# Patient Record
Sex: Female | Born: 1960
Health system: Southern US, Community
[De-identification: ages and names within clinical notes are randomized; demographics above are authoritative.]

## PROBLEM LIST (undated history)

## (undated) ENCOUNTER — Emergency Department: Admission: EM | Payer: No Typology Code available for payment source | Source: Home / Self Care

## (undated) DIAGNOSIS — R41841 Cognitive communication deficit: Secondary | ICD-10-CM

## (undated) DIAGNOSIS — K635 Polyp of colon: Secondary | ICD-10-CM

## (undated) DIAGNOSIS — E785 Hyperlipidemia, unspecified: Secondary | ICD-10-CM

## (undated) DIAGNOSIS — I671 Cerebral aneurysm, nonruptured: Secondary | ICD-10-CM

## (undated) DIAGNOSIS — G8190 Hemiplegia, unspecified affecting unspecified side: Secondary | ICD-10-CM

## (undated) DIAGNOSIS — I639 Cerebral infarction, unspecified: Secondary | ICD-10-CM

## (undated) DIAGNOSIS — G459 Transient cerebral ischemic attack, unspecified: Secondary | ICD-10-CM

## (undated) DIAGNOSIS — K648 Other hemorrhoids: Secondary | ICD-10-CM

## (undated) HISTORY — DX: Other hemorrhoids: K64.8

## (undated) HISTORY — DX: Cognitive communication deficit: R41.841

## (undated) HISTORY — DX: Polyp of colon: K63.5

## (undated) HISTORY — DX: Cerebral infarction, unspecified: I63.9

## (undated) HISTORY — DX: Hemiplegia, unspecified affecting unspecified side: G81.90

## (undated) HISTORY — DX: Transient cerebral ischemic attack, unspecified: G45.9

## (undated) HISTORY — PX: BREAST BIOPSY: SHX20

## (undated) HISTORY — PX: COLONOSCOPY: SHX174

## (undated) HISTORY — DX: Cerebral aneurysm, nonruptured: I67.1

## (undated) HISTORY — DX: Hyperlipidemia, unspecified: E78.5

---

## 1999-09-28 ENCOUNTER — Other Ambulatory Visit: Admission: RE | Admit: 1999-09-28 | Discharge: 1999-09-28 | Payer: Self-pay | Admitting: Internal Medicine

## 1999-10-22 HISTORY — PX: PARTIAL HYSTERECTOMY: SHX80

## 1999-11-15 ENCOUNTER — Encounter: Admission: RE | Admit: 1999-11-15 | Discharge: 1999-11-15 | Payer: Self-pay | Admitting: Obstetrics & Gynecology

## 1999-12-13 ENCOUNTER — Encounter: Admission: RE | Admit: 1999-12-13 | Discharge: 1999-12-13 | Payer: Self-pay | Admitting: Obstetrics & Gynecology

## 2000-02-02 ENCOUNTER — Encounter (INDEPENDENT_AMBULATORY_CARE_PROVIDER_SITE_OTHER): Payer: Self-pay | Admitting: Specialist

## 2000-02-03 ENCOUNTER — Inpatient Hospital Stay (HOSPITAL_COMMUNITY): Admission: RE | Admit: 2000-02-03 | Discharge: 2000-02-04 | Payer: Self-pay | Admitting: Obstetrics & Gynecology

## 2000-02-05 ENCOUNTER — Inpatient Hospital Stay (HOSPITAL_COMMUNITY): Admission: AD | Admit: 2000-02-05 | Discharge: 2000-02-05 | Payer: Self-pay | Admitting: *Deleted

## 2000-02-07 ENCOUNTER — Encounter: Admission: RE | Admit: 2000-02-07 | Discharge: 2000-02-07 | Payer: Self-pay | Admitting: Obstetrics & Gynecology

## 2000-02-28 ENCOUNTER — Encounter: Admission: RE | Admit: 2000-02-28 | Discharge: 2000-02-28 | Payer: Self-pay | Admitting: Obstetrics & Gynecology

## 2000-06-05 ENCOUNTER — Emergency Department (HOSPITAL_COMMUNITY): Admission: EM | Admit: 2000-06-05 | Discharge: 2000-06-05 | Payer: Self-pay | Admitting: Emergency Medicine

## 2000-06-05 ENCOUNTER — Encounter: Payer: Self-pay | Admitting: Emergency Medicine

## 2000-10-21 ENCOUNTER — Encounter (INDEPENDENT_AMBULATORY_CARE_PROVIDER_SITE_OTHER): Payer: Self-pay | Admitting: *Deleted

## 2000-10-21 LAB — CONVERTED CEMR LAB

## 2001-08-27 ENCOUNTER — Other Ambulatory Visit: Admission: RE | Admit: 2001-08-27 | Discharge: 2001-08-27 | Payer: Self-pay | Admitting: Anesthesiology

## 2001-08-27 ENCOUNTER — Other Ambulatory Visit: Admission: RE | Admit: 2001-08-27 | Discharge: 2001-08-27 | Payer: Self-pay | Admitting: Family Medicine

## 2002-01-21 ENCOUNTER — Encounter: Admission: RE | Admit: 2002-01-21 | Discharge: 2002-01-21 | Payer: Self-pay | Admitting: Sports Medicine

## 2002-06-18 ENCOUNTER — Emergency Department (HOSPITAL_COMMUNITY): Admission: EM | Admit: 2002-06-18 | Discharge: 2002-06-18 | Payer: Self-pay | Admitting: Emergency Medicine

## 2003-10-22 LAB — HM MAMMOGRAPHY

## 2003-11-13 ENCOUNTER — Emergency Department (HOSPITAL_COMMUNITY): Admission: EM | Admit: 2003-11-13 | Discharge: 2003-11-13 | Payer: Self-pay | Admitting: Emergency Medicine

## 2004-07-22 ENCOUNTER — Emergency Department (HOSPITAL_COMMUNITY): Admission: EM | Admit: 2004-07-22 | Discharge: 2004-07-22 | Payer: Self-pay | Admitting: Emergency Medicine

## 2004-12-23 ENCOUNTER — Ambulatory Visit: Payer: Self-pay | Admitting: Internal Medicine

## 2005-04-02 ENCOUNTER — Emergency Department (HOSPITAL_COMMUNITY): Admission: EM | Admit: 2005-04-02 | Discharge: 2005-04-02 | Payer: Self-pay | Admitting: Emergency Medicine

## 2005-12-12 ENCOUNTER — Emergency Department (HOSPITAL_COMMUNITY): Admission: EM | Admit: 2005-12-12 | Discharge: 2005-12-12 | Payer: Self-pay | Admitting: Family Medicine

## 2006-04-19 DIAGNOSIS — F17200 Nicotine dependence, unspecified, uncomplicated: Secondary | ICD-10-CM | POA: Insufficient documentation

## 2006-04-20 ENCOUNTER — Encounter (INDEPENDENT_AMBULATORY_CARE_PROVIDER_SITE_OTHER): Payer: Self-pay | Admitting: *Deleted

## 2006-11-19 ENCOUNTER — Encounter (INDEPENDENT_AMBULATORY_CARE_PROVIDER_SITE_OTHER): Payer: Self-pay | Admitting: Internal Medicine

## 2007-07-30 ENCOUNTER — Ambulatory Visit: Payer: Self-pay | Admitting: Internal Medicine

## 2007-07-30 ENCOUNTER — Encounter (INDEPENDENT_AMBULATORY_CARE_PROVIDER_SITE_OTHER): Payer: Self-pay | Admitting: *Deleted

## 2007-07-30 DIAGNOSIS — E669 Obesity, unspecified: Secondary | ICD-10-CM | POA: Insufficient documentation

## 2007-07-30 DIAGNOSIS — N939 Abnormal uterine and vaginal bleeding, unspecified: Secondary | ICD-10-CM

## 2007-07-30 DIAGNOSIS — N926 Irregular menstruation, unspecified: Secondary | ICD-10-CM | POA: Insufficient documentation

## 2007-08-08 LAB — CONVERTED CEMR LAB
HDL: 57 mg/dL (ref >39)
LDL Cholesterol: 169 mg/dL — ABNORMAL HIGH (ref 0–99)
VLDL: 31 mg/dL (ref 0–40)

## 2007-08-14 ENCOUNTER — Ambulatory Visit (HOSPITAL_COMMUNITY): Admission: RE | Admit: 2007-08-14 | Discharge: 2007-08-14 | Payer: Self-pay | Admitting: Obstetrics & Gynecology

## 2008-08-05 ENCOUNTER — Encounter (INDEPENDENT_AMBULATORY_CARE_PROVIDER_SITE_OTHER): Payer: Self-pay | Admitting: Internal Medicine

## 2008-08-05 ENCOUNTER — Ambulatory Visit: Payer: Self-pay | Admitting: Internal Medicine

## 2008-08-05 DIAGNOSIS — G43109 Migraine with aura, not intractable, without status migrainosus: Secondary | ICD-10-CM | POA: Insufficient documentation

## 2008-08-05 DIAGNOSIS — E785 Hyperlipidemia, unspecified: Secondary | ICD-10-CM | POA: Insufficient documentation

## 2008-08-06 ENCOUNTER — Encounter: Payer: Self-pay | Admitting: Internal Medicine

## 2008-08-06 LAB — CONVERTED CEMR LAB
ALT: 15 units/L (ref 0–35)
AST: 19 units/L (ref 0–37)
CO2: 23 meq/L (ref 19–32)
Creatinine, Ser: 0.8 mg/dL (ref 0.40–1.20)
GFR calc Af Amer: >60 mL/min (ref >60)
HDL: 48 mg/dL (ref >39)
LDL Cholesterol: 160 mg/dL — ABNORMAL HIGH (ref 0–99)
Sodium: 142 meq/L (ref 135–145)
Total Bilirubin: 0.3 mg/dL (ref 0.3–1.2)
Total CHOL/HDL Ratio: 4.8
Total Protein: 6.7 g/dL (ref 6.0–8.3)
Triglycerides: 112 mg/dL (ref <150)
VLDL: 22 mg/dL (ref 0–40)

## 2009-01-10 ENCOUNTER — Emergency Department (HOSPITAL_COMMUNITY): Admission: EM | Admit: 2009-01-10 | Discharge: 2009-01-11 | Payer: Self-pay | Admitting: Emergency Medicine

## 2009-01-20 ENCOUNTER — Encounter: Payer: Self-pay | Admitting: Internal Medicine

## 2009-02-10 ENCOUNTER — Encounter: Payer: Self-pay | Admitting: Internal Medicine

## 2009-08-25 ENCOUNTER — Other Ambulatory Visit: Admission: RE | Admit: 2009-08-25 | Discharge: 2009-08-25 | Payer: Self-pay | Admitting: Internal Medicine

## 2009-08-25 ENCOUNTER — Ambulatory Visit: Payer: Self-pay | Admitting: Internal Medicine

## 2009-08-25 DIAGNOSIS — N898 Other specified noninflammatory disorders of vagina: Secondary | ICD-10-CM | POA: Insufficient documentation

## 2009-08-25 DIAGNOSIS — N3000 Acute cystitis without hematuria: Secondary | ICD-10-CM | POA: Insufficient documentation

## 2009-08-25 LAB — HM PAP SMEAR: HM Pap smear: NORMAL

## 2009-08-26 ENCOUNTER — Encounter: Payer: Self-pay | Admitting: Internal Medicine

## 2009-08-26 LAB — CONVERTED CEMR LAB
Candida species: NEGATIVE
GC Probe Amp, Genital: NEGATIVE

## 2009-08-30 ENCOUNTER — Telehealth: Payer: Self-pay | Admitting: Internal Medicine

## 2010-03-22 NOTE — Assessment & Plan Note (Signed)
Summary: ACUTE-STOMACH PAIN(UNASSIGNED)/CFB   Vital Signs:  Patient profile:   50 year old female Height:      66 inches (167.64 cm) Weight:      200.2 pounds (89.64 kg) BMI:     31.94 Temp:     98.3 degrees F (36.83 degrees C) oral Pulse rate:   90 / minute BP sitting:   122 / 75  (right arm) Cuff size:   [id number]  Vitals Entered By: Theotis Barrio NT II (August 25, 2009 2:22 PM) CC: ABD PAIN FOR ABOUT 2 WEEKS / ? MEDICATION REFILL Is Patient Diabetic? No Pain Assessment Patient in pain? yes     Location: abdomen Intensity:        6 Type: dull Onset of pain  FOR ABOUT 2 WEEKS Nutritional Status BMI of > 30 = obese  Have you ever been in a relationship where you felt threatened, hurt or afraid?No   Does patient need assistance? Functional Status Self care Ambulation Normal Comments ABD PAIN FOR ABOUT 2 WEEKS  / ? MEDICATION REFILL   Primary Care Provider:  Lars Mage MD  CC:  ABD PAIN FOR ABOUT 2 WEEKS / ? MEDICATION REFILL.  History of Present Illness: Patient is a 50 year old relatively healthy women comes for a lower abdominal pain and discharge.  The pain started 2 weeks ago, its on and off [prsnt in the lower abdomen right aroung suprapubic region, 6/10 at its worse, a/w burning micturition and whitish discharge from her vagina. Off note she is s/p total hysterctomy 2 years ago. No fevers, chills, nausea, vomiting,hematuria, constipation, change in bowel or bladder habits. No increased frequency noted.  No recent new partners. The partner does not have similar discharge.  Bp is well controlled.  Headaches are well controlled and not taking medicines anymore.  Preventive Screening-Counseling & Management  Alcohol-Tobacco     Smoking Status: current     Smoking Cessation Counseling: yes     Packs/Day: 1/2  Caffeine-Diet-Exercise     Does Patient Exercise: yes     Type of exercise: WALKING     Exercise (avg: min/session): 1HOUR     Times/week: 2  Problems  Prior to Update: 1)  Mixed Hyperlipidemia  (ICD-272.2) 2)  Migraine With Aura  (ICD-346.00) 3)  Preventive Health Care  (ICD-V70.0) 4)  Obesity, Unspecified  (ICD-278.00) 5)  Abnormal Vaginal Bleeding  (ICD-626.9) 6)  Tobacco Dependence  (ICD-305.1)  Medications Prior to Update: 1)  Aleve 220 Mg  Tabs (Naproxen Sodium) .... Take One Pill As Needed For Pain. 2)  Excedrin Migraine 250-250-65 Mg Tabs (Aspirin-Acetaminophen-Caffeine) .... Take One Tab Up To 3 Times Daily For Headache 3)  Pravastatin Sodium 20 Mg Tabs (Pravastatin Sodium) .... Take One Tab Daily  Current Medications (verified): 1)  Excedrin Migraine 250-250-65 Mg Tabs (Aspirin-Acetaminophen-Caffeine) .... Take One Tab Up To 3 Times Daily For Headache 2)  Pravastatin Sodium 20 Mg Tabs (Pravastatin Sodium) .... Take One Tab Daily 3)  Azithromycin 1 Gm Pack (Azithromycin) .... Take 1 Gm Tab Once Weekly For 2 Weeks and Then Stop.(Total of 2 Tabs) 4)  Metronidazole 500 Mg Tabs (Metronidazole) .... Take 1 Tablet By Mouth Two Times A Day  Allergies (verified): No Known Drug Allergies  Past History:  Past Medical History: Last updated: 07/30/2007 Core biopsy, breast 4/03--normal  Past Surgical History: Last updated: 08/05/2008 Hysterectomy - Partial (still has cervix)- 10/22/1999  Family History: Last updated: 07/30/2007 aunt, gm--breast ca, htn--mom, lung ca--dad 3 sons, healthy  Social History: Last updated: 07/30/2007 Smokes 1/2 ppd since age 50 Social drinker No drug use. 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 aunt, gm--breast ca, htn--mom, lung ca--dad 3 sons, healthy  Social History: Reviewed history from 07/30/2007 and no changes required. Smokes 1/2 ppd since age 50 Social  drinker No drug use. 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 EXT: No odema noted Neuro: Non focal, gait is normal Skin: no rashes noted.  PELVIC exam: thick whitish dischrage noted over the vaginal vault, no particular odour noted, wet prep followed by GC, chlymydia probe and pap smear done.   Impression & Recommendations:  Problem # 1:  VAGINAL DISCHARGE (ICD-623.5) Assessment Comment Only I did a pelvic exam including taking samples for wet prep, GC chlamydia and pap smear. The results of wet prep are c/w bacterial vaginosis and she was discharged on metronidazole for 14 days. We also tretaed the patinet for presumed GC/Chlymidia as we di not had the results at the time of visit and usually these illnesses coexist. I also asked her to make sure that partner is not infected and to get him checked. Orders: Admin of Therapeutic Inj (IM or [place]) (16109) Admin of Therapeutic Inj (IM or [place]) (60454) Rocephin  250mg  (U9811) Admin of Therapeutic Inj  intramuscular or subcutaneous (91478) T-Wet Prep by Molecular Probe [phone]) T-Chlamydia & GC Probe, Genital ([id number])  Problem # 2:  ACUTE CYSTITIS (ICD-595.0) Assessment: Comment Only Urine dipstick was negative so I did not follow it further.   Her updated medication list for this problem includes:    Azithromycin 1 Gm Pack (Azithromycin) .Marland Kitchen... Take 1 gm tab once weekly for 2 weeks and then stop.(total of 2 tabs)    Metronidazole 500 Mg Tabs (Metronidazole)  .Marland Kitchen... Take 1 tablet by mouth two times a day  Orders: T-Urinalysis Dipstick only ([id number]) T-Culture, Urine ([id number])  Encouraged to push clear liquids, get enough rest, and take acetaminophen as needed. To be seen in 10 days if no improvement, sooner if worse.  Problem # 3:  PREVENTIVE HEALTH CARE (ICD-V70.0) Assessment: Comment Only Pap smear done today. Mammogram due next year. Orders: T-PAP [facility]) [phone])  Problem # 4:  TOBACCO DEPENDENCE (ICD-305.1) Assessment: Comment Only  Encouraged smoking cessation and discussed different methods for smoking cessation.   Complete Medication List: 1)  Excedrin Migraine 250-250-65 Mg Tabs (Aspirin-acetaminophen-caffeine) .... Take one tab up to 3 times daily for headache 2)  Pravastatin Sodium 20 Mg Tabs (Pravastatin sodium) .... Take one tab daily 3)  Azithromycin 1 Gm Pack (Azithromycin) .... Take 1 gm tab once weekly for 2 weeks and then stop.(total of 2 tabs) 4)  Metronidazole 500 Mg Tabs (Metronidazole) .... Take 1 tablet by mouth two times a day  Patient Instructions: 1)  Please schedule a follow-up appointment in 2  weeks. 2)  Please schedule a follow-up appointment as needed. 3)  Tobacco is very bad for your health and your loved ones! You Should stop smoking!. 4)  Stop Smoking Tips: Choose a Quit date. Cut down before the Quit date. decide what you will do as a substitute when you feel the urge to smoke(gum,toothpick,exercise). 5)  It is important that you exercise regularly at least 20 minutes 5 times a week. If you develop chest pain, have severe difficulty breathing, or feel very tired , stop exercising immediately and seek medical attention. 6)  You need to lose weight. Consider a lower calorie diet and regular exercise.  7)  Check your Blood Pressure regularly. If it is above: you should make an appointment. Prescriptions: METRONIDAZOLE 500 MG TABS (METRONIDAZOLE) Take 1 tablet by mouth two times a day  #28 x 0    Entered and Authorized by:   Lars Mage MD   Signed by:   Lars Mage MD on 08/25/2009   Method used:   Print then Give to Patient   RxID:   [id number] AZITHROMYCIN 1 GM PACK (AZITHROMYCIN) take 1 gm tab once weekly for 2 weeks and then stop.(total of 2 tabs)  #2 x 0   Entered and Authorized by:   Lars Mage MD   Signed by:   Lars Mage MD on 08/25/2009   Method used:   Print then Give to Patient   RxID:   [id number]  Process Orders Check Orders Results:     [facility]: ABN not required for this insurance Tests Sent for requisitioning (August 26, 2009 3:24 PM):     08/25/2009: [facility] -- T-Culture, Urine [[id number]] (signed)     08/25/2009: [facility] -- T-Wet Prep by Molecular Probe [phone] (signed)     08/25/2009: [facility] -- T-Chlamydia & GC Probe, Genital [[id number]] (signed)    Prevention & Chronic Care Immunizations   Influenza vaccine: Not documented   Influenza vaccine deferral: Deferred  (08/25/2009)    Tetanus booster: Not documented   Td booster deferral: Deferred  (08/25/2009)    Pneumococcal vaccine: Not documented  Other Screening   Pap smear: Done.  (10/21/2000)    Mammogram: Done.  (10/22/2003)   Mammogram action/deferral: Deferred to age 12  (08/25/2009)   Smoking status: current  (08/25/2009)   Smoking cessation counseling: yes  (08/25/2009)  Lipids   Total Cholesterol: 230  (08/05/2008)   LDL: 160  (08/05/2008)   LDL Direct: Not documented   HDL: 48  (08/05/2008)   Triglycerides: 112  (08/05/2008)    SGOT (AST): 19  (08/05/2008)   SGPT (ALT): 15  (08/05/2008)   Alkaline phosphatase: 67  (08/05/2008)   Total bilirubin: 0.3  (08/05/2008)    Lipid flowsheet reviewed?: Yes   Progress toward LDL goal: Unchanged  Self-Management Support :   Personal Goals (by the next clinic visit) :      Personal LDL goal: 100  (08/25/2009)    Patient will work on  the following items until the next clinic visit to reach self-care goals:     Medications and monitoring: take my medicines every day, check my blood pressure, bring all of my medications to every visit  (08/25/2009)     Eating: eat more vegetables, use fresh or frozen vegetables, eat baked foods instead of fried foods, eat fruit for snacks and desserts, limit or avoid alcohol  (08/25/2009)     Activity: take a 30 minute walk every day  (  08/25/2009)    Lipid self-management support: Written self-care plan  (08/25/2009)   Lipid self-care plan printed.    Medication Administration  Injection # 1:    Medication: Rocephin  250mg     Diagnosis: VAGINAL DISCHARGE (ICD-623.5)    Route: IM    Site: LUOQ gluteus    Exp Date: 08/21/2011    Lot #: [id number]    Mfr: sandoz    Patient tolerated injection without complications    Given by: Merrie Roof RN (August 25, 2009 3:32 PM)  Orders Added: 1)  T-Urinalysis Dipstick only [[id number]] 2)  T-PAP [facility]) [46962] 3)  Est. Patient Level V [95284] 4)  Admin of Therapeutic Inj (IM or [place]) [96372] 5)  Admin of Therapeutic Inj (IM or [place]) [13244] 6)  Rocephin  250mg  [J0696] 7)  Admin of Therapeutic Inj  intramuscular or subcutaneous [96372] 8)  T-Culture, Urine [[id number]] 9)  T-Wet Prep by Molecular Probe [[id number]] 10)  T-Chlamydia & GC Probe, Genital [[id number]]  Pt is discharged.  She has had no reaction to rocephin.  Pt was kept for 30 minutes after administration. Merrie Roof RN  August 25, 2009 3:59 PM  Process Orders Check Orders Results:     [facility]: ABN not required for this insurance Tests Sent for requisitioning (August 26, 2009 3:24 PM):     08/25/2009: [facility] -- T-Culture, Urine [[id number]] (signed)     08/25/2009: [facility] -- T-Wet Prep by Molecular Probe [phone] (signed)     08/25/2009: [facility] -- T-Chlamydia & GC Probe, Genital  [[id number]] (signed)

## 2010-03-22 NOTE — Progress Notes (Signed)
Summary: Results  Phone Note Call from Patient   Caller: Patient Reason for Call: Referral Summary of Call: Call from pt wanted to get results of her cultures.  Pt was given the results of her cultures.  Pt wanted to know why she got the Rocephin.  Expplained that it was given in treatment for the possible Chylamida infection.  Pt was informed that she was also given the Flagyl that will take care of the Bacterial infection that she has.  Pt said that her partner was treated with 2 pills only. Wants to know if he needs further treatment for the BV.  Pt was advised to have partner use a condom if they have intercourse before her treatment is completed and until she is told if he may need further treatment.  Pt voiced understanding of the plan and will await a call with further instructions. Initial call taken by: Angelina Ok RN,  August 30, 2009 4:23 PM  Follow-up for Phone Call        Return call to pt.  Consulted Dr. Eben Burow pt's partner will not need further treatment.  Pt voiced understanding of the plan Follow-up by: Angelina Ok RN,  August 30, 2009 4:52 PM

## 2010-04-21 ENCOUNTER — Encounter: Payer: Self-pay | Admitting: Internal Medicine

## 2010-06-24 IMAGING — CR DG SHOULDER 2+V*R*
3 series · 3 of 3 positions shown · non-contrast
Comparison: None

CLINICAL DATA: Right shoulder pain

RIGHT SHOULDER - 2+ VIEW

[w shoulder ap internal righ]
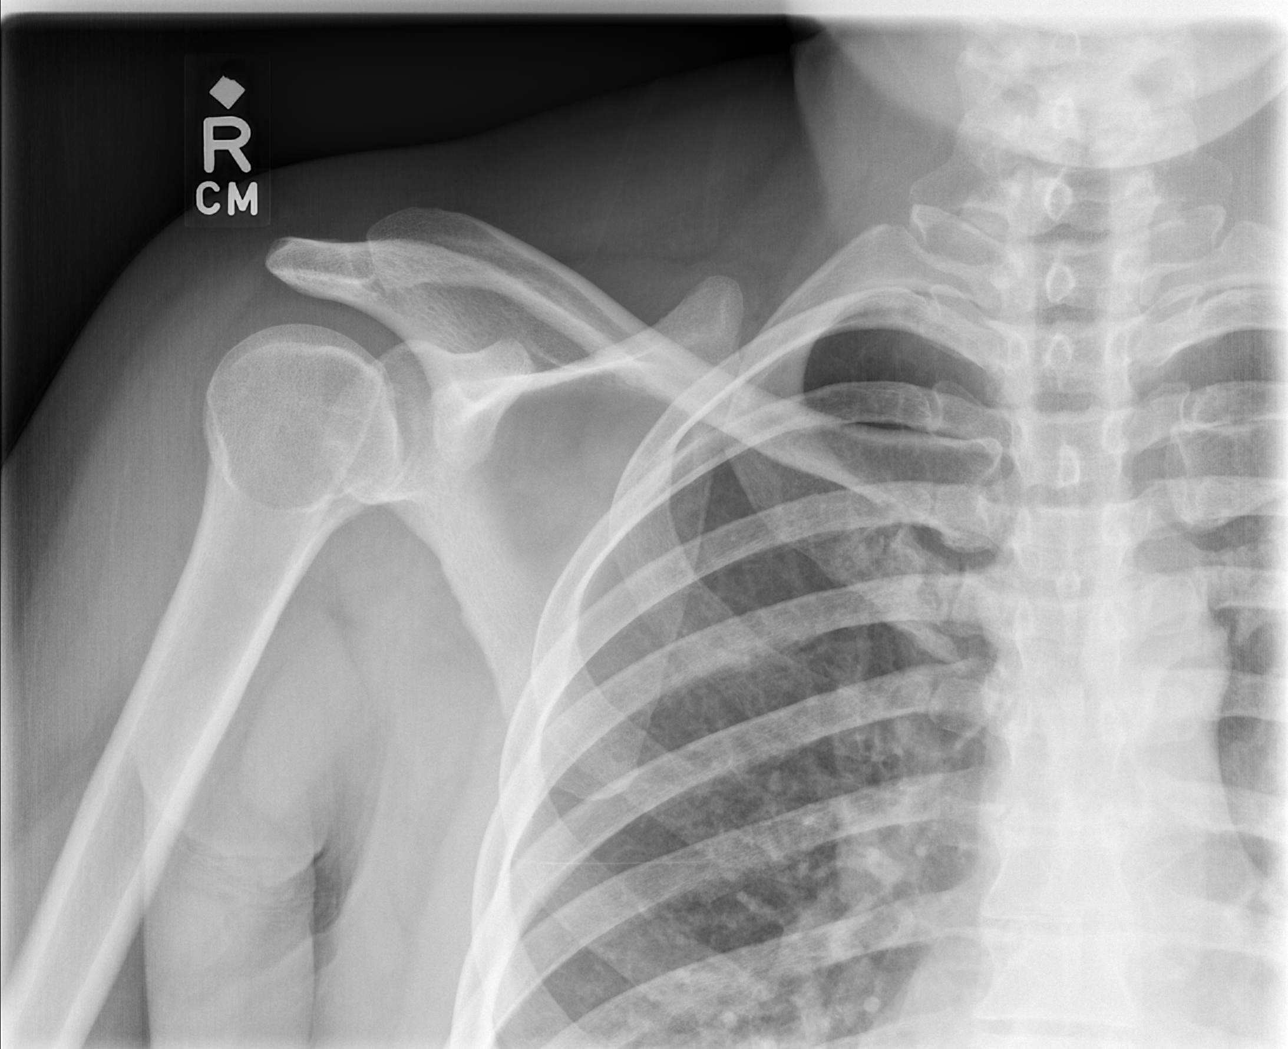

[w shoulder ap external righ]
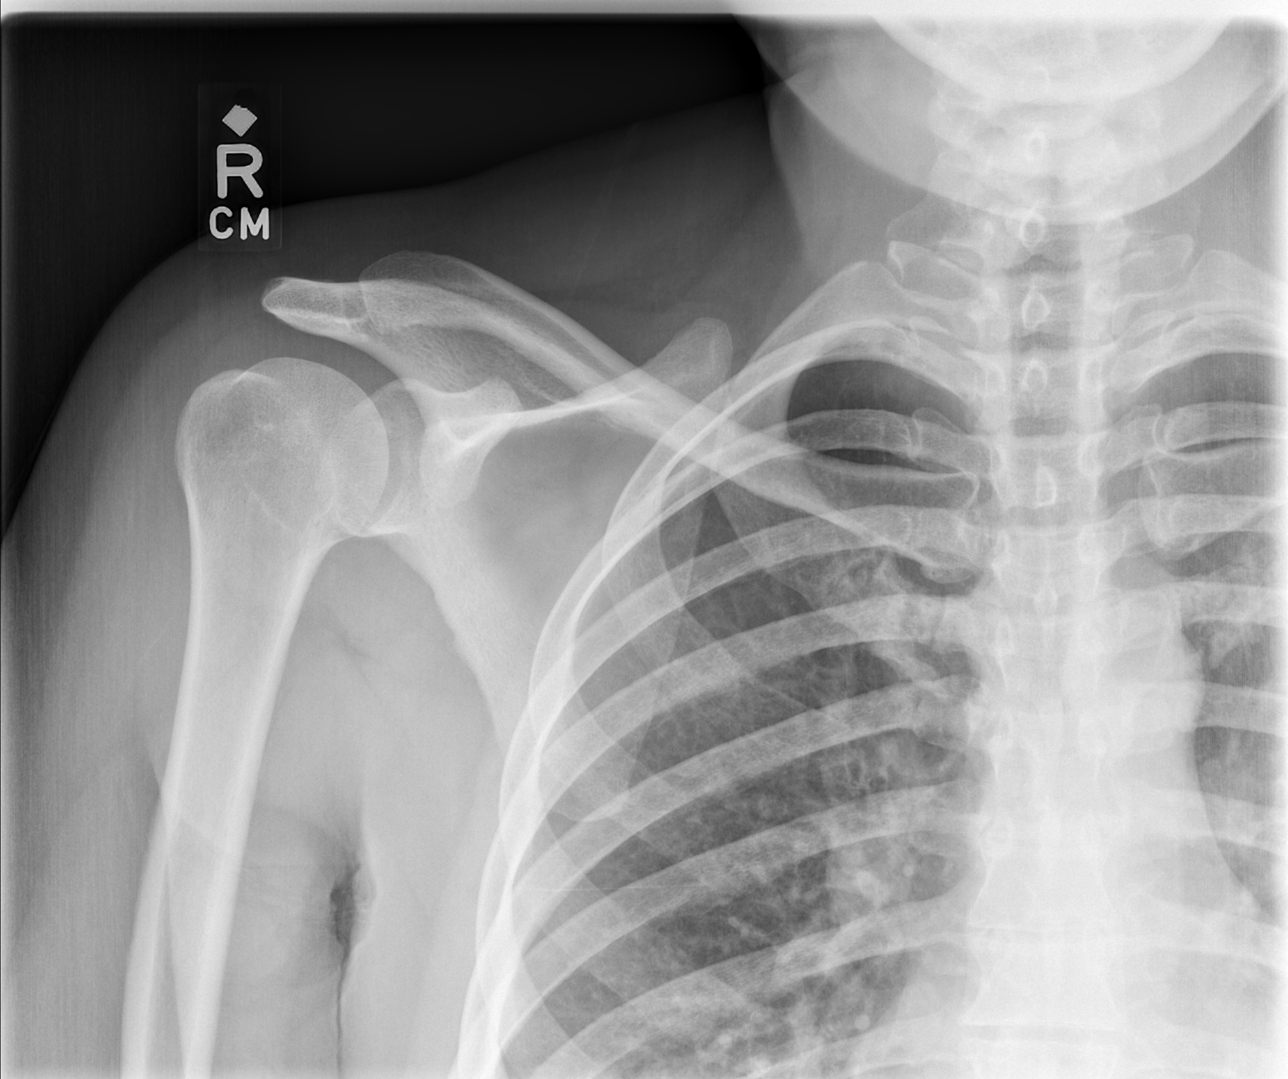

[w shoulder y view right]
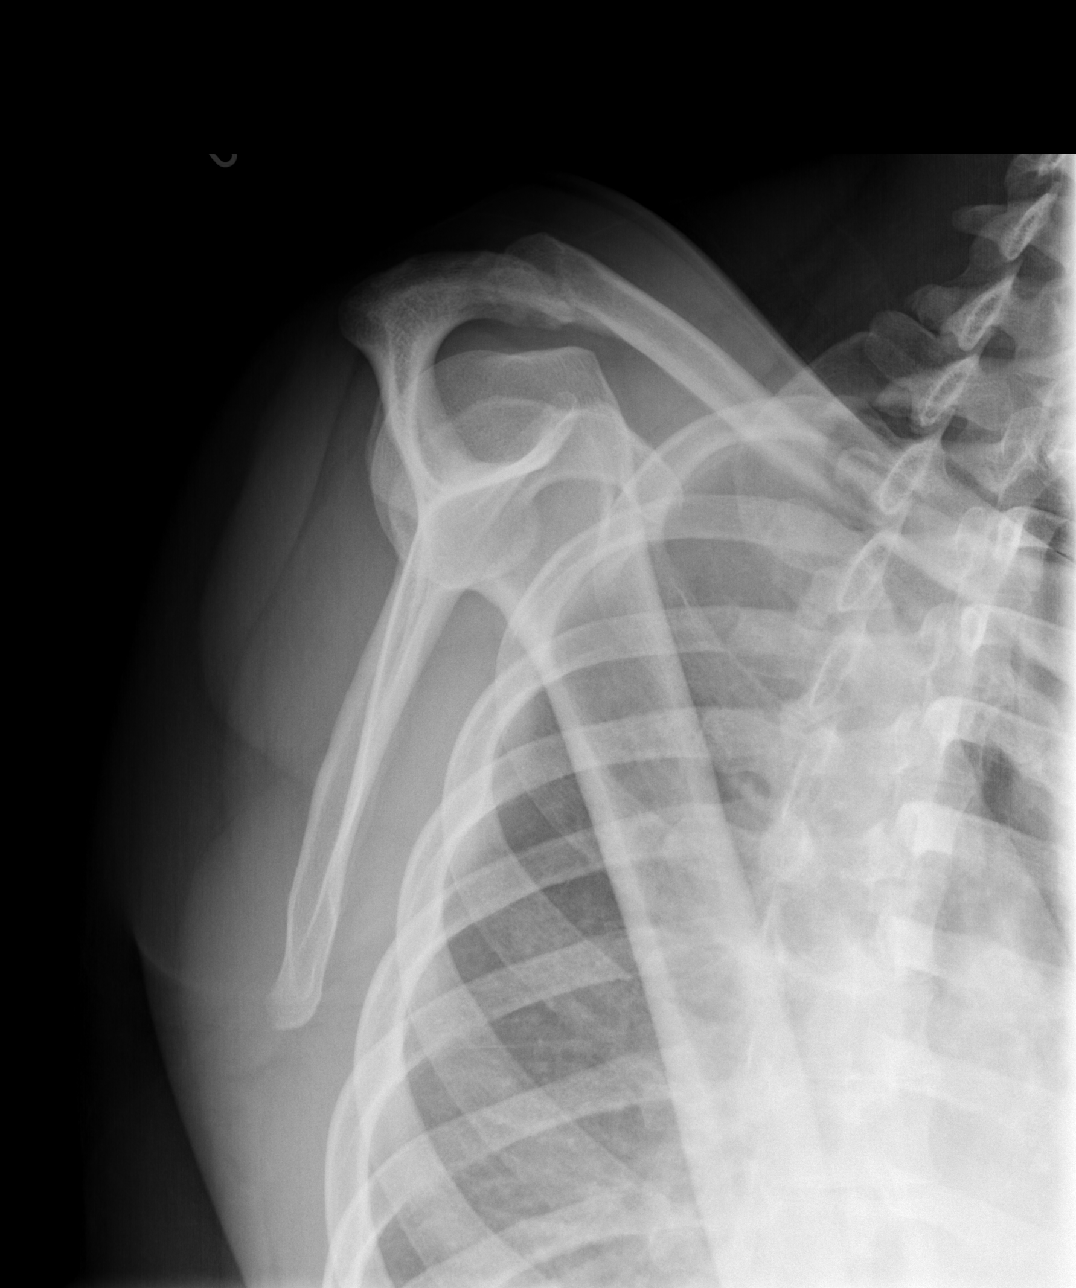

[3 of 3 positions shown; findings below may reference images not displayed]

FINDINGS: Humeral head projects over the glenoid fossa.  No
subluxation or dislocation.  Glenohumeral joint appears normal,
with no evident degenerative change.  No acute fracture.

The right clavicle is intact and the acromioclavicular joint is
aligned.  No degenerative change of the acromioclavicular joint is
appreciated.  The visualized right ribs are intact.
IMPRESSION: No acute findings or significant degenerative change.

## 2010-07-08 NOTE — Op Note (Signed)
[facility]  Patient:    Betty Archer, Betty Archer                         MRN: [id number] Proc. Date: 02/06/00 Attending:  Roseanna Rainbow, M.D.                           Operative Report  PREOPERATIVE DIAGNOSES:  Cervical hypertrophy with mild uterine prolapse.  POSTOPERATIVE DIAGNOSES:  Cervical hypertrophy with mild uterine prolapse.  OPERATION:  Total vaginal hysterectomy.  SURGEON:  Roseanna Rainbow, M.D.  ASSISTANT:  Bing Neighbors. Clearance Coots, M.D.  ANESTHESIA:  General endotracheal.  COMPLICATIONS:  None.  ESTIMATED BLOOD LOSS:  100 cc.  FLUIDS:  100 cc of lactated Ringers.  FINDINGS:  Exam under anesthesia revealed a small retroverted uterus with elongated cervix.  Intraoperative findings revealed, again, small uterus and normal tubes and ovaries.  INFORMED CONSENT:  The risks, benefits and alternatives of the procedure were reviewed with the patient, and informed consent was obtained.  DESCRIPTION OF PROCEDURE:  She is taken to the operating room with an IV running.  She was then draped in the dorsal lithotomy position, prepped and draped in the usual sterile fashion.  A weighted speculum was then placed into the vagina and the cervix grasped with the [facility] tenaculum.  The cervix was then injected anteriorly with 1% lidocaine with 1:200,000 epinephrine.  The cervix was then incised anteriorly and the bladder dissected off the pubovesical cervical fascia anteriorly with Metzenbaum scissors.  The anterior cul-de-sac was entered sharply.  The anterior cul-de-sac was entered sharply without difficulty.  The same procedure was performed posteriorly and the posterior cul-de-sac entered sharply without difficulty.  At this point, hemoclamps were placed on the uterosacral ligaments on either side.  These were then transected and suture ligated with #1 chromic.  Hemostasis was assured.  The cardinal ligaments were clamped on both sides,  transected and suture ligated with #1 chromic suture.  The uterine artery was anterior __________ ____ _____ and suture ligated _________.  Good hemostasis was visualized.  At this point, the fundus was delivered posteriorly and the _________ procedure was then repeated.  The cornuae were clamped with Heaney clamps, transected and suture ligated.  The broad ligaments were then serially clamped, transected and suture ligated.  _________________ of the cardinal ligament complexes were then clamped on both sides, transected and suture ligated with _____. The uterus was then ________ ___________ _________________.  As there was some bleeding noted from the posterior vaginal cuff, it was closed with a running interlocking suture of the same. The remainder of the vaginal cuff was closed with ____________ sutures.  All instruments were removed and the patient was taken out of the lithotomy position and the patient awakened from general anesthesia. She was taken to the PACU in stable condition. Sponge, lap, needle and instrument counts were correct times two.  PATHOLOGY:  Uterus and cervix. DD:  02/06/00 TD:  02/07/00 Job: [id number]

## 2010-07-08 NOTE — H&P (Signed)
[facility]  Patient:    Betty Archer, Betty Archer                         MRN: [id number] Adm. Date:  12/13/99 Attending:  Roseanna Rainbow, M.D. CC:         [facility]  Pershing Cox, M.D.  GYN Outpatient Clinic - [facility]  OB GYN Teaching Service [facility]   History and Physical  CHIEF COMPLAINT:              The patient is a 50 year old para 3 with uterine procidentia who presents for a total vaginal hysterectomy.  HISTORY OF PRESENT ILLNESS:   The patient complains of feeling as if something were falling out of her vagina, for the past 14 years; however, it has become progressive worse in the last six months.  She also has concomitant sharp vaginal discomfort and dyspareunia.  She denies any urinary incontinence or problems with constipation.  She is status post three spontaneous vaginal deliveries and a bilateral tubal ligation.  ALLERGIES:                    No known drug allergies.  CURRENT MEDICATIONS:          She denies.  PAST OB/GYN HISTORY:          As above.  She describes a distant history of a sexually transmitted disease; however, denies any hospitalization for pelvic inflammatory disease.  A Pap smear taken in August 2001, was within normal limits.  PAST MEDICAL HISTORY:         She denies.  PAST SURGICAL HISTORY:        As above.  FAMILY HISTORY:               Noncontributory.  PHYSICAL EXAMINATION:  VITAL SIGNS:                  Temperature 99.1 degrees, pulse 73, blood pressure 111/68, weight 177.6 pounds, height 5 feet 7 inches.  GENERAL:                      A well-developed, well-nourished female in no apparent distress.  HEENT:                        Normocephalic, atraumatic.  NECK:                         Supple.  LUNGS:                        Clear to auscultation bilaterally.  HEART:                        A regular rate and rhythm.  ABDOMEN:                       No organomegaly.  GENITALIA:                    She has normal-appearing external female genitalia.  BIMANUAL:                     The cervix at rest in the dorsal lithotomy is approximately 1.0 cm from the introitus.  With Valsalva it comes down  to the introitus.  There is no significant cystocele or rectocele.  The cervix is approximately 3.0 to 4.0 cm long.  The uterus is retroverted, likely normal size.  The adnexa are nonpalpable, nontender.  RECTOVAGINAL:                 Examination is confirmatory.  EXTREMITIES:                  No cyanosis, clubbing, or edema.  ASSESSMENT:                   Uterine procidentia.  PLAN:                         Total vaginal hysterectomy. DD:  12/13/99 TD:  12/13/99 Job: [id number]

## 2010-07-08 NOTE — Discharge Summary (Signed)
[facility]  Patient:    Betty Archer, Betty Archer                       MRN: [id number] Adm. Date:  [id number] Disc. Date: [id number] Attending:  Michaelle Copas                           Discharge Summary  CHIEF COMPLAINT:              The patient is a 50 year old para 3 with uterine ______ who presents for a total vaginal hysterectomy.  Please see the dictated preoperative history and physical.  HOSPITAL COURSE:              The patient was admitted, underwent a total vaginal hysterectomy.  Again, please see the dictated operative summary.  Her postoperative course was remarkable for urinary retention that did not resolve with replacing the Foley catheter for a 24 hour period.  She was discharged to home on postoperative day #2 after the Foley catheter was replaced with a leg bag.  She was counseled on management of the leg bag.  She was to follow-up in the clinic in several days.  DISCHARGE DIAGNOSES:          Uterine ______.  PROCEDURE:                    Total vaginal hysterectomy.  CONDITION:                    Stable.  DIET:                         Regular.  ACTIVITY:                     No intercourse or strenuous activity.  MEDICATIONS:                  Motrin, Percocet, Septra.  DISPOSITION:                  The patient was to follow-up in the GYN clinic on March 02, 2000 at 9:45 a.m. DD:  04/06/00 TD:  04/06/00 Job: [id number]

## 2010-08-15 ENCOUNTER — Ambulatory Visit (INDEPENDENT_AMBULATORY_CARE_PROVIDER_SITE_OTHER): Payer: BC Managed Care – PPO | Admitting: Internal Medicine

## 2010-08-15 ENCOUNTER — Encounter: Payer: Self-pay | Admitting: Internal Medicine

## 2010-08-15 VITALS — BP 107/67 | HR 97 | Temp 98.1°F | Wt 203.2 lb

## 2010-08-15 DIAGNOSIS — N898 Other specified noninflammatory disorders of vagina: Secondary | ICD-10-CM

## 2010-08-15 DIAGNOSIS — Z23 Encounter for immunization: Secondary | ICD-10-CM

## 2010-08-15 DIAGNOSIS — F17200 Nicotine dependence, unspecified, uncomplicated: Secondary | ICD-10-CM

## 2010-08-15 DIAGNOSIS — J029 Acute pharyngitis, unspecified: Secondary | ICD-10-CM

## 2010-08-15 DIAGNOSIS — E782 Mixed hyperlipidemia: Secondary | ICD-10-CM

## 2010-08-15 DIAGNOSIS — E669 Obesity, unspecified: Secondary | ICD-10-CM

## 2010-08-15 DIAGNOSIS — Z0000 Encounter for general adult medical examination without abnormal findings: Secondary | ICD-10-CM

## 2010-08-15 LAB — LIPID PANEL
Cholesterol: 242 mg/dL — ABNORMAL HIGH (ref 0–200)
HDL: 43 mg/dL (ref >39)
Total CHOL/HDL Ratio: 5.6 Ratio

## 2010-08-15 LAB — CBC WITH DIFFERENTIAL/PLATELET
Eosinophils Relative: 4 % (ref 0–5)
HCT: 38.7 % (ref 36.0–46.0)
Hemoglobin: 12.6 g/dL (ref 12.0–15.0)
Lymphocytes Relative: 44 % (ref 12–46)
Lymphs Abs: 3.5 10*3/uL (ref 0.7–4.0)
MCV: 74.9 fL — ABNORMAL LOW (ref 78.0–100.0)
Monocytes Absolute: 0.5 10*3/uL (ref 0.1–1.0)
RBC: 5.17 MIL/uL — ABNORMAL HIGH (ref 3.87–5.11)
WBC: 7.9 10*3/uL (ref 4.0–10.5)

## 2010-08-15 LAB — BASIC METABOLIC PANEL
BUN: 12 mg/dL (ref 6–23)
CO2: 24 mEq/L (ref 19–32)
Chloride: 107 mEq/L (ref 96–112)
Creat: 0.83 mg/dL (ref 0.50–1.10)

## 2010-08-15 MED ORDER — METRONIDAZOLE 500 MG PO TABS: 500.0000 mg | ORAL_TABLET | Freq: Two times a day (BID) | ORAL | Status: DC

## 2010-08-15 MED ORDER — CEFTRIAXONE SODIUM 1 G IJ SOLR: 250.0000 mg | INTRAMUSCULAR | Status: DC

## 2010-08-15 MED ORDER — PSEUDOEPHEDRINE-CODEINE-GG 30-10-100 MG/5ML PO SOLN: 10.0000 mL | Freq: Four times a day (QID) | ORAL | Status: AC | PRN

## 2010-08-15 MED ORDER — LIDOCAINE HCL 1 % IJ SOLN
250.0000 mg | Freq: Once | INTRAMUSCULAR | Status: AC
  Administered 2010-08-15: 250 mg via INTRAMUSCULAR

## 2010-08-15 MED ORDER — DOXYCYCLINE HYCLATE 100 MG PO TABS: 100.0000 mg | ORAL_TABLET | Freq: Two times a day (BID) | ORAL | Status: AC

## 2010-08-15 NOTE — Assessment & Plan Note (Signed)
Not ready to quit at this time.

## 2010-08-15 NOTE — Assessment & Plan Note (Addendum)
Patient is complaining of vaginal discharge going on since last 2 weeks. The discharge is most likely secondary to bacterial vaginosis which she has had in the past. Patient's partner does not have any symptoms or discharge. According to the exam and the fact that vaginal wall was inflamed I am concerned about infection secondary to [facility] or chlamydia. I will go ahead and treat her for GC and Chlamydia and bacterial vaginosis. Patient was advised that we will call her back if any of her blood tests are positive. If GC or chlamydia is positive patient's partner needs to get treatment as well. I also ordered CBC with differential to look for any infection and check for anemia the same time since patient has had history of heavy menstrual bleeding.

## 2010-08-15 NOTE — Progress Notes (Signed)
  Subjective:    Patient ID: Betty Archer, female    DOB: 1960-11-03, 50 y.o.   MRN: [id number]  HPI  Patient is a 50 year old female who is here today with complaints of vagina discharge since last 2 weeks and sore throat going on since last 2 weeks.  Vagina discharge is white in color, small in amount of, smokes less than one pack per day, not associated with itching, associated with foul smell, not associated with abdominal pain or vagina tenderness. Patient denies dyspareunia. Patient has had similar discharge in the past and has been treated with Flagyl. Denies any symptoms in her partner and has had one partner over the last one year. Patient also wants to get checked for HIV.  Patient has mixed hyperlipidemia with LDL of 162-2 years ago patient currently is on pravastatin.  Patient complains of upper respiratory tract symptoms especially cough going on since last 2 weeks. Patient has tried several different over-the-counter medications which have not helped. No fever, chills noted. Patient does not complain of pain while swallowing.  Still smoking about half a pack a day and not interested in quitting at this time.  Patient also complains of infection in her left eyelid along with swelling which has come down significantly with warm compresses and home measures.  Review of Systems  Constitutional: Negative for fever, activity change and appetite change.  HENT: Negative for sore throat.   Eyes: Positive for pain and itching.  Respiratory: Negative for cough and shortness of breath.   Cardiovascular: Negative for chest pain and leg swelling.  Gastrointestinal: Negative for nausea, abdominal pain, diarrhea, constipation and abdominal distention.  Genitourinary: Positive for vaginal discharge. Negative for frequency, hematuria and difficulty urinating.  Neurological: Negative for dizziness and headaches.  Psychiatric/Behavioral: Negative for suicidal ideas and behavioral problems.        Objective:   Physical Exam  Constitutional: She is oriented to person, place, and time. She appears well-developed and well-nourished.  HENT:  Head: Normocephalic and atraumatic.  Eyes: Conjunctivae and EOM are normal. Pupils are equal, round, and reactive to light. No scleral icterus.  Neck: Normal range of motion. Neck supple. No JVD present. No thyromegaly present.  Cardiovascular: Normal rate, regular rhythm, normal heart sounds and intact distal pulses.  Exam reveals no gallop and no friction rub.   No murmur heard. Pulmonary/Chest: Effort normal and breath sounds normal. No respiratory distress. She has no wheezes. She has no rales.  Abdominal: Soft. Bowel sounds are normal. She exhibits no distension and no mass. There is no tenderness. There is no rebound and no guarding.  Genitourinary: No labial fusion. There is no rash, tenderness, lesion or injury on the right labia. There is no rash, tenderness, lesion or injury on the left labia. There is erythema around the vagina. Vaginal discharge found.       Patient does not have a cervix secondary to hysterectomy.  Musculoskeletal: Normal range of motion. She exhibits no edema and no tenderness.  Lymphadenopathy:       Head (right side): Submandibular adenopathy present.       Head (left side): Submandibular adenopathy present.    She has cervical adenopathy.    She has no axillary adenopathy.  Neurological: She is alert and oriented to person, place, and time.  Psychiatric: She has a normal mood and affect. Her behavior is normal.          Assessment & Plan:

## 2010-08-15 NOTE — Assessment & Plan Note (Signed)
Patient hasn't had a lipid profile done in last 2 years. Would check her lipid profile today and continue to monitor at this time. Will add statin as necessary.

## 2010-08-15 NOTE — Patient Instructions (Signed)
Sexually Transmitted Disease Sexually transmitted disease (STD) refers to any infection that is passed from person to person during sexual activity. This may happen by way of saliva, semen, blood, vaginal mucus, or urine. Infections are passed in many ways. For example, the common cold could be passed during sexual activity, but it is not considered a sexually transmitted infection. CAUSES Sexual activity is the contact between the genitals of one partner and the genitals, anus, eyes, mouth, or throat of another. These activities include sexual intercourse, the sharing of sex toys, needles, or any other intimate contact of the genitals, mouth, or rectal areas. An STD may be spread by bacteria (germ), virus, or parasite. These small germ-like agents can enter the body and infect the skin and linings of the:  Vagina.  Rectum.   Urethra.   Cervix.  Eyes.   Mouth.  Throat.   HIV/AIDS and hepatitis B infection can also be spread by needles, through the blood. Common STDs include:  Gonorrhea.  Chlamydia.   Syphilis.   HIV/AIDS (human immunodeficiency virus / acquired immunodeficiency syndrome).   Genital herpes.  Hepatitis B.   Trichomonas.   Human papilloma virus (HPV).   Pubic lice.   SYMPTOMS Some people may not have any symptoms, but can still transmit the infection to others.  Painful or bloody urination.   Pain in the pelvis, abdomen, vagina, anus, throat, or eyes. Where it hurts depends on the type of sexual contact.    Skin rash, itching, irritation, growths, or lesions (sores, ulcer). These usually occur in the genital or anal area. These growths may or may not be painful, irritated, or itch.   Abnormal vaginal discharge.   Fever.   Pain or bleeding during sexual intercourse.   Swollen glands in the groin area.   Yellow skin and eyes, seen with hepatitis (jaundice).  RISK FACTORS   Having multiple sex partners.   Having a sex partner who has other sex  partners.   Taking illegal drugs or drinking too much alcohol. This can affect your judgment and put you in a vulnerable position.   Having unprotected sex, not using condoms.   Having open sores on your skin or in your mouth, during sexual activity.   Being involved in high-risk sexual acts.   Engaging in oral or anal sex.Marland Kitchen  DIAGNOSIS  Diagnosis of sexually transmitted infections begins with your caregiver taking your medical history and performing a physical exam. Additional testing may be required.   Cultures are used to diagnose some STDs, including gonorrhea and chlamydia. A specimen is taken, grown in the laboratory for a day or two, and then identified. Newer tests can diagnose certain STDs, such as chlamydia, within minutes.   Trichomonas or syphilis can be seen through a microscope, in a sample of discharge.   Syphilis can be seen through a microscope or diagnosed with a blood test.   HIV and hepatitis B can be diagnosed with a blood test.   Pubic lice, which look like tiny bugs in the pubic hair, can usually be seen with a magnifying glass or a microscope.   The appearance of sores or blisters on the skin is enough to make a diagnosis and begin treatment for genital herpes.   HPV (human papilloma virus) is seen on a Pap test. There are several types of HPV that can cause cervical cancer.   Colposcopy (applying special solution and looking at the cervix with a lighted, magnifying tube) is used to see the problem  better when diagnosing HPV.   Laparoscopy (looking into the pelvis at the female organs, with a lighted tube, through a small incision) can also be used for diagnosis.  TREATMENT  Chlamydia, gonorrhea, trichomonas, and syphilis can be cured with antibiotics (injected, oral, vaginal creams, suppositories). These are medicines that kill germs.   Genital herpes, hepatitis, and HIV cannot be cured. They often can be treated with medicines, to lessen the symptoms.    Genital warts from HPV can be removed with medicine, freezing, electrocautery (burning), or surgery. But the warts may come back.   All sexual partners should be informed, tested, and treated for all STDs.   Surgery can be used for removal of HPV of the cervix, vagina, or vulva.   If you have one STD, you are also at risk for all others. If one is discovered, treatment often will be started to cover other possible infections. This may be done even if other infections are not proven with testing.  HOME CARE INSTRUCTIONS AND PREVENTION  Finish all medicine as prescribed. Incomplete treatment of chlamydia and gonorrhea puts women at risk of becoming sterile (unable to have children). Women are also at risk of having a tubal pregnancy (pregnancy outside the uterus), which can be life threatening. Sterility or tubal pregnancies can occur even in fully treated individuals. Following the prescribed treatment decreases the chance. That is why it is important to finish ALL medicines given to you.   Return immediately if you develop an oral temperature of 101F or higher.   Only take over-the-counter or prescription medicines for pain, discomfort, or fever as directed by your caregiver.   Rest and eat a balanced diet with plenty of fluids.   Do not have sex until treatment is completed and you have followed up at your caregiver's office or the clinic to which you were referred. If your diagnosis is confirmed by culture or another method, your recent sexual partners need treatment. This is true even if they are problem free or have a negative culture or evaluation. They also should not have sex until their caregiver says it is ok.  STDs should be checked after treatment.  HIV and hepatitis need frequent blood tests and follow-up examinations. This is to monitor the effects of the infection on your body. Any new or worsening symptoms should be reported to your caregiver.   HPV needs follow-up Pap tests.    Only use latex condoms and water-soluble lubricants. Do not use vaseline or oils.   Avoid alcohol and illegal drugs, because they can affect your mind, and you may end up not practicing safe sex.   A vaccine is available for HPV and hepatitis. Everyone should get the vaccine.   Avoid risky sex practices that can break the skin, because it makes you more at risk for an STD.   Oral, vaginal ring, patches, hormone injection contraceptives, spermicides, diaphragm, IUD, and cervical caps do not protect against STDs.  PROGNOSIS Long-term effects vary, depending on the type and severity of the STD, and the effectiveness of the treatment. An STD can be treated and cured in one week, one or more months, or an STD and its symptoms can last for many years, or even a lifetime (HIV and hepatitis). STDs can also cause damage to the female organs, chronic pelvic pain, infertility, and recurrences of the STD, especially genital herpes, hepatitis, and HIV.   Trichomonas and pubic lice have few or no long-term effects, other than continued symptoms.  Frequent STDs can cause:   Chronic (ongoing) pelvic pain, or closing of the urethra in the penis.   Chlamydia and gonorrhea can cause:   Infertility.   Chronic pelvic inflammatory disease, and chronic pain.   HPV infections increase a woman's risk of having abnormal cells in her cervix and developing cervical cancer.   HPV causes genital warts, which can come back even after treatment.   Several types of HPV (not warts) cause cervical cancer. HPV is the main cause of cervical cancer.   HIV can progress to AIDS and result in death.    Hepatitis B can cause permanent liver damage, liver cancer, and death.    Syphilis can be cured in the early stages. But in advanced stages, it causes permanent brain and heart damage, and death.   Syphilis during pregnancy, if not treated, can cause:   Deformities.   Death of the baby.  WARNING:You have been  diagnosed with an STD, or you may have an STD. All sexually transmitted infections are contagious. People who have an STD or are being evaluated for a possible STD should not have sexual contact with another person until they receive treatment, until the infection has cleared, or until tests are negative for STD. All STDs can be transmitted to babies before or during birth. Effects of STD infection on babies depend on the infection and the effectiveness of treatment. Effects can include infections, birth defects, and even death. SEEK MEDICAL CARE IF:  You think you have an STD, even if you do not have symptoms. Call your caregiver for evaluation and treatment, if needed.   You think or know your sex partner has an STD.   You have any of the symptoms mentioned above.  Document Released: 04/29/2002 Document Re-Released: 05/03/2009 [facility] Patient Information 2011 [place], [place].  Follow up in 6 months or earlier as needed. Follow safe sexual practices. We will call you with your test results. If you do not hear back from [place] consider that everything is good.

## 2010-08-15 NOTE — Assessment & Plan Note (Signed)
Patient was counseled about benefits of weight loss.

## 2010-08-16 LAB — WET PREP BY MOLECULAR PROBE
Candida species: NEGATIVE
Trichomonas vaginosis: NEGATIVE

## 2010-08-16 LAB — HIV ANTIBODY (ROUTINE TESTING W REFLEX): HIV: NONREACTIVE

## 2010-08-25 ENCOUNTER — Encounter: Payer: Self-pay | Admitting: Internal Medicine

## 2010-09-14 ENCOUNTER — Encounter: Payer: Self-pay | Admitting: Internal Medicine

## 2010-09-21 ENCOUNTER — Ambulatory Visit (AMBULATORY_SURGERY_CENTER): Payer: BC Managed Care – PPO | Admitting: *Deleted

## 2010-09-21 ENCOUNTER — Encounter: Payer: Self-pay | Admitting: Internal Medicine

## 2010-09-21 VITALS — Ht 63.0 in | Wt 203.0 lb

## 2010-09-21 DIAGNOSIS — Z1211 Encounter for screening for malignant neoplasm of colon: Secondary | ICD-10-CM

## 2010-09-21 MED ORDER — PEG-KCL-NACL-NASULF-NA ASC-C 100 G PO SOLR: ORAL | Status: DC

## 2010-10-05 ENCOUNTER — Other Ambulatory Visit: Payer: BC Managed Care – PPO | Admitting: Internal Medicine

## 2010-10-10 ENCOUNTER — Encounter: Payer: Self-pay | Admitting: Internal Medicine

## 2010-10-10 ENCOUNTER — Ambulatory Visit (AMBULATORY_SURGERY_CENTER): Payer: BC Managed Care – PPO | Admitting: Internal Medicine

## 2010-10-10 VITALS — BP 138/76 | HR 63 | Temp 97.2°F | Resp 20 | Ht 66.0 in | Wt 200.0 lb

## 2010-10-10 DIAGNOSIS — Z1211 Encounter for screening for malignant neoplasm of colon: Secondary | ICD-10-CM

## 2010-10-10 DIAGNOSIS — D126 Benign neoplasm of colon, unspecified: Secondary | ICD-10-CM

## 2010-10-10 MED ORDER — SODIUM CHLORIDE 0.9 % IV SOLN: 500.0000 mL | INTRAVENOUS | Status: DC

## 2010-10-10 NOTE — Progress Notes (Signed)
Clide Cliff, RN attempted rt wrist unsuccessfully with #24. Clide Cliff, RN placed #24 rt fore arm successfully. MAW

## 2010-10-10 NOTE — Patient Instructions (Signed)
FOLLOW DISCHARGE INSTRUCTIONS (BLUE & GREEN SHEETS)    INFORMATION GIVEN ON POLYPS, YOU WILL RECEIVE A LETTER FROM DR. PYRTLE WITHIN A COUPLE OF WEEKS TELLING YOU WHAT PATHOLOGY RESULTS WERE

## 2010-10-11 ENCOUNTER — Telehealth: Payer: Self-pay | Admitting: *Deleted

## 2010-10-11 NOTE — Telephone Encounter (Signed)

## 2010-10-14 ENCOUNTER — Encounter: Payer: Self-pay | Admitting: Internal Medicine

## 2011-10-10 ENCOUNTER — Emergency Department (HOSPITAL_COMMUNITY): Payer: BC Managed Care – PPO

## 2011-10-10 ENCOUNTER — Encounter (HOSPITAL_COMMUNITY): Payer: Self-pay | Admitting: Emergency Medicine

## 2011-10-10 ENCOUNTER — Emergency Department (HOSPITAL_COMMUNITY)
Admission: EM | Admit: 2011-10-10 | Discharge: 2011-10-10 | Disposition: A | Discharge status: Home / Self Care | Payer: BC Managed Care – PPO | Attending: Emergency Medicine | Admitting: Emergency Medicine

## 2011-10-10 DIAGNOSIS — L02219 Cutaneous abscess of trunk, unspecified: Secondary | ICD-10-CM | POA: Insufficient documentation

## 2011-10-10 DIAGNOSIS — L03319 Cellulitis of trunk, unspecified: Secondary | ICD-10-CM | POA: Insufficient documentation

## 2011-10-10 DIAGNOSIS — I4891 Unspecified atrial fibrillation: Secondary | ICD-10-CM | POA: Insufficient documentation

## 2011-10-10 DIAGNOSIS — F17200 Nicotine dependence, unspecified, uncomplicated: Secondary | ICD-10-CM | POA: Insufficient documentation

## 2011-10-10 DIAGNOSIS — L0390 Cellulitis, unspecified: Secondary | ICD-10-CM

## 2011-10-10 DIAGNOSIS — E785 Hyperlipidemia, unspecified: Secondary | ICD-10-CM | POA: Insufficient documentation

## 2011-10-10 LAB — CBC WITH DIFFERENTIAL/PLATELET
Basophils Relative: 0 % (ref 0–1)
Eosinophils Relative: 4 % (ref 0–5)
Hemoglobin: 12.6 g/dL (ref 12.0–15.0)
MCV: 74.1 fL — ABNORMAL LOW (ref 78.0–100.0)
Monocytes Relative: 7 % (ref 3–12)
Neutrophils Relative %: 60 % (ref 43–77)
Platelets: 236 10*3/uL (ref 150–400)
RBC: 5.13 MIL/uL — ABNORMAL HIGH (ref 3.87–5.11)
WBC: 7.7 10*3/uL (ref 4.0–10.5)

## 2011-10-10 LAB — POCT I-STAT TROPONIN I: Troponin i, poc: 0.01 ng/mL (ref 0.00–0.08)

## 2011-10-10 MED ORDER — DOXYCYCLINE HYCLATE 100 MG PO CAPS: 100.0000 mg | ORAL_CAPSULE | Freq: Two times a day (BID) | ORAL | Status: AC

## 2011-10-10 NOTE — ED Notes (Signed)
Case manager at bedside ok to discharge.

## 2011-10-10 NOTE — ED Notes (Signed)
Pt undressed, in gown, on monitor, continuous pulse oximetry and blood pressure cuff 

## 2011-10-10 NOTE — ED Notes (Signed)
Spoke with Case management re: Rx, pt updated on plan of care

## 2011-10-10 NOTE — ED Notes (Signed)
Pt has painful rash on back that has pustules and looks like shingles.  Pt reports chest pain to mid chest that is intermittent and states hurts with deep breath.  Pt reports dry throat

## 2011-10-10 NOTE — ED Provider Notes (Signed)
Medical screening examination/treatment/procedure(s) were conducted as a shared visit with non-physician practitioner(s) and myself.  I personally evaluated the patient during the encounter  Patient seen by me. Not exactly clear what the etiology is of the redness and the somewhat pustular-like lesions on the back of her back. However patient states there was redness there's initiated any kind discomfort there is no vesicles it is all left-sided the back but doesn't follow around very far laterally. Could be shingles but the patient fell she had bit by something and it was more of an infection that developed. Will treat with doxycycline keeping shingles in mind sure he had the symptoms for 7 days. If vesicles occur patient will return or if things are worse.   Shelda Jakes, MD 10/10/11 [phone]

## 2011-10-10 NOTE — Progress Notes (Signed)
   CARE MANAGEMENT NOTE 10/10/2011  Patient:  Betty Archer, Betty Archer   Account Number:  [id number]  Date Initiated:  10/10/2011  Documentation initiated by:  [facility]  Subjective/Objective Assessment:   infection     Action/Plan:   works for [employer]   Anticipated DC Date:  10/10/2011   Anticipated DC Plan:  HOME/SELF CARE      DC Planning Services  Medication Assistance  CM consult      Choice offered to / List presented to:             Status of service:  Completed, signed off Medicare Important Message given?   (If response is "NO", the following Medicare IM given date fields will be blank) Date Medicare IM given:   Date Additional Medicare IM given:    Discharge Disposition:  HOME/SELF CARE  Per UR Regulation:    If discussed at Long Length of Stay Meetings, dates discussed:    Comments:  10/10/2011 1500 Pt states she has been out all summer and want start working until next week. Her first check will be in a month. She has drug coverage with her BCBS. NCM explained she could shop around for cheapest price for medication. She usually use [facility] for her medication. Unable to assist pt through the [employer] meds assistance fund. Isidoro Donning RN CCM Case Mgmt phone [phone]

## 2011-10-10 NOTE — ED Notes (Deleted)
Pt sent from [facility] was supposed to have walk test today but became SOB and tachycardic; pt with hx of afib; pt alert at present and denies CP at present but sts some SOB

## 2011-10-10 NOTE — ED Provider Notes (Signed)
History     CSN: [id number]  Arrival date & time 10/10/11  1152   First MD Initiated Contact with Patient 10/10/11 1234      No chief complaint on file.   (Consider location/radiation/quality/duration/timing/severity/associated sxs/prior treatment) HPI Comments: Patient reports a painful rash on her back for the past week. She reports being bit by something that she did not see. She reports having sharp pain in the area of the rash since she first noticed it. She reports the pain being constant. She has tried topical benadryl cream and antibiotic cream on the rash which did not help. She reports having some chest pain on inspiration. She denies recent illness, fever, NVD, abdominal pain.    Past Medical History  Diagnosis Date  . Hyperlipidemia   . Atrial fibrillation     Past Surgical History  Procedure Date  . Breast biopsy      Core biopsy done on April 2003  . Partial hysterectomy  10/22/1999     Still has cervix    Family History  Problem Relation Age of Onset  . Breast cancer Maternal Grandmother   . Breast cancer Maternal Aunt   . Hypertension Mother   . Lung cancer Father   . Colon cancer Neg Hx   . Esophageal cancer Neg Hx   . Stomach cancer Neg Hx     History  Substance Use Topics  . Smoking status: Current Everyday Smoker -- 0.5 packs/day    Types: Cigarettes  . Smokeless tobacco: Never Used  . Alcohol Use: 0.0 oz/week      patient is a social drinker    OB History    Grav Para Term Preterm Abortions TAB SAB Ect Mult Living                  Review of Systems  Constitutional: Negative for fever, chills, diaphoresis and fatigue.  Respiratory: Negative for cough, shortness of breath and wheezing.   Cardiovascular: Positive for chest pain. Negative for palpitations and leg swelling.  Gastrointestinal: Negative for nausea, vomiting, abdominal pain, diarrhea and constipation.  Musculoskeletal: Positive for back pain and gait problem.  Skin: Positive  for rash. Negative for wound.  Neurological: Negative for dizziness, numbness and headaches.    Allergies  Review of patient's allergies indicates no known allergies.  Home Medications  No current outpatient prescriptions on file.  BP 128/81  Pulse 68  Temp 99.6 F (37.6 C) (Oral)  Resp 20  SpO2 98%  Physical Exam  Nursing note and vitals reviewed. Constitutional: She is oriented to person, place, and time. She appears well-developed and well-nourished. No distress.  HENT:  Head: Normocephalic and atraumatic.  Mouth/Throat: No oropharyngeal exudate.  Eyes: Conjunctivae are normal. No scleral icterus.  Neck: Normal range of motion.  Cardiovascular: Normal rate and regular rhythm.  Exam reveals no gallop and no friction rub.   No murmur heard. Pulmonary/Chest: Effort normal and breath sounds normal. No respiratory distress. She has no wheezes. She has no rales. She exhibits no tenderness.  Abdominal: Soft. There is no tenderness.  Musculoskeletal: Normal range of motion.  Neurological: She is alert and oriented to person, place, and time.  Skin: Skin is warm and dry. Rash noted. Rash is macular and papular. She is not diaphoretic.     Psychiatric: She has a normal mood and affect. Her behavior is normal.    ED Course  Procedures (including critical care time)   Date: 10/10/2011  Rate: 91  Rhythm: normal  sinus rhythm  QRS Axis: normal  Intervals: normal  ST/T Wave abnormalities: nonspecific T wave changes  Conduction Disutrbances:none  Narrative Interpretation: normal sinus rhythm with T wave abnormalities  Old EKG Reviewed: none available    Labs Reviewed  CBC WITH DIFFERENTIAL - Abnormal; Notable for the following:    RBC 5.13 (*)     MCV 74.1 (*)     MCH 24.6 (*)     All other components within normal limits  POCT I-STAT TROPONIN I   Dg Chest 2 View  10/10/2011  *RADIOLOGY REPORT*  Clinical Data: Chest pain and short of breath.  Insect bite.  CHEST - 2  VIEW  Comparison: None.  Findings: Normal heart size.  Clear lungs.  No pleural effusion. No pneumothorax. Mid level thoracic wedging has a chronic appearance.  IMPRESSION: No active cardiopulmonary disease.   Original Report Authenticated By: Donavan Burnet, M.D.      No diagnosis found.    MDM  2:14 PM Patient's chest pain is unlikely cardiac in nature due to negative troponin and negative EKG. Her chest xray shows no acute changes. There is a possibility that this could be zoster. Patient will return if the rash develops pustules and ulcerations for reassesment. She can be discharged with doxycycline for possible infection from insect bite. Dr. Deretha Emory saw the patient and is agreeable to the plan.        Emilia Beck, PA-C 10/10/11 [address], PA-C 10/10/11 1505

## 2011-10-11 NOTE — ED Provider Notes (Signed)
Medical screening examination/treatment/procedure(s) were conducted as a shared visit with non-physician practitioner(s) and myself.  I personally evaluated the patient during the encounter  Shelda Jakes, MD 10/11/11 2003

## 2011-10-17 ENCOUNTER — Ambulatory Visit (INDEPENDENT_AMBULATORY_CARE_PROVIDER_SITE_OTHER): Payer: BC Managed Care – PPO | Admitting: Internal Medicine

## 2011-10-17 ENCOUNTER — Telehealth: Payer: Self-pay | Admitting: *Deleted

## 2011-10-17 ENCOUNTER — Encounter: Payer: Self-pay | Admitting: Internal Medicine

## 2011-10-17 VITALS — BP 128/76 | HR 72 | Temp 97.7°F | Ht 66.0 in | Wt 206.2 lb

## 2011-10-17 DIAGNOSIS — F17200 Nicotine dependence, unspecified, uncomplicated: Secondary | ICD-10-CM

## 2011-10-17 DIAGNOSIS — E782 Mixed hyperlipidemia: Secondary | ICD-10-CM

## 2011-10-17 DIAGNOSIS — N898 Other specified noninflammatory disorders of vagina: Secondary | ICD-10-CM

## 2011-10-17 DIAGNOSIS — B029 Zoster without complications: Secondary | ICD-10-CM | POA: Insufficient documentation

## 2011-10-17 LAB — LIPID PANEL
Cholesterol: 230 mg/dL — ABNORMAL HIGH (ref 0–200)
Triglycerides: 139 mg/dL (ref <150)

## 2011-10-17 LAB — COMPREHENSIVE METABOLIC PANEL
BUN: 11 mg/dL (ref 6–23)
CO2: 27 mEq/L (ref 19–32)
Calcium: 10 mg/dL (ref 8.4–10.5)
Chloride: 109 mEq/L (ref 96–112)
Creat: 0.82 mg/dL (ref 0.50–1.10)
Glucose, Bld: 79 mg/dL (ref 70–99)

## 2011-10-17 MED ORDER — AMITRIPTYLINE HCL 25 MG PO TABS: 25.0000 mg | ORAL_TABLET | Freq: Every day | ORAL | Status: AC

## 2011-10-17 MED ORDER — GABAPENTIN 300 MG PO CAPS: 300.0000 mg | ORAL_CAPSULE | Freq: Three times a day (TID) | ORAL | Status: DC

## 2011-10-17 MED ORDER — AMITRIPTYLINE HCL 25 MG PO TABS: 150.0000 mg | ORAL_TABLET | Freq: Every day | ORAL | Status: DC

## 2011-10-17 NOTE — Assessment & Plan Note (Addendum)
Signs and symptoms most compatible with shingles. Rash appeared about a week ago, so no benefit and antiviral medication at this time. Gabapentin has been shown not to be more beneficial than placebo. Some studies have shown some benefit with amitriptyline, but not proven.  -Tylenol and amitriptyline for analgesia given significant pain; we'll plan to continue amitriptyline for 30-90 days, depending on patient's symptoms. Prescription written for one month at this time. She will call us with her symptoms when she is off of amitriptyline. -Instructed to keep rash covered

## 2011-10-17 NOTE — Assessment & Plan Note (Signed)
Patient's LDL was 139 last year. Patient has not been taking Pravachol. We will recheck lipid panel today, and if LDL is persistently greater than 130, we will restart statin. Also checking cmet today.

## 2011-10-17 NOTE — Assessment & Plan Note (Signed)
Continues to smoke daily. Counseled on the importance of smoking cessation.

## 2011-10-17 NOTE — Progress Notes (Signed)
Subjective:   Patient ID: Betty Archer female   DOB: 1960/05/03 51 y.o.   MRN: [id number]  HPI: [name] Gott is a 51 y.o. woman with h/o HLD who presents with  1. Chest Pain: left sided, sharp then throbbing, radiates to axillary region, "stinging," worsening, can't sleep at night. Intermittent. Tried tums & another acid reduce Worse with breathing, mvmts Better when she holds the area. She notes that it is sore to palpation.  She has not been in contact with anyone with anything similar.  Never experienced anything similar.    Thinks she had chicken pox as a child. Pruritic rash was noted on the posterior aspect of her left back, thought to be an insect bite.  No SOB.  Minimal dizziness with blurry eyes.  No HA.  No heart palpitations.   Past Medical History  Diagnosis Date  . Hyperlipidemia   . Atrial fibrillation    Current Outpatient Prescriptions  Medication Sig Dispense Refill  . doxycycline (VIBRAMYCIN) 100 MG capsule Take 1 capsule (100 mg total) by mouth 2 (two) times daily.  20 capsule  0   Family History  Problem Relation Age of Onset  . Breast cancer Maternal Grandmother   . Breast cancer Maternal Aunt   . Hypertension Mother   . Lung cancer Father   . Colon cancer Neg Hx   . Esophageal cancer Neg Hx   . Stomach cancer Neg Hx    History   Social History  . Marital Status: Single    Spouse Name: N/A    Number of Children: N/A  . Years of Education: N/A   Occupational History  .  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 Smoking status: Current Everyday Smoker -- 0.5 packs/day    Types: Cigarettes  . Smokeless tobacco: Never Used  . Alcohol Use: 0.0 oz/week      patient is a social drinker  . Drug Use: No  . Sexually Active: None   Other Topics Concern  . 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   Review of  Systems: Constitutional: Denies fever, chills, diaphoresis, appetite change and fatigue.  HEENT: Denies photophobia, eye pain, redness, hearing loss, ear pain, congestion, sore throat, rhinorrhea, sneezing, mouth sores, trouble swallowing, neck pain, neck stiffness and tinnitus.   Respiratory: Denies SOB, DOE, cough, chest tightness,  and wheezing.   Cardiovascular: Denies palpitations and leg swelling.  Gastrointestinal: Denies nausea, vomiting, abdominal pain, diarrhea, constipation, blood in stool and abdominal distention.  Genitourinary: Denies dysuria, urgency, frequency, hematuria, flank pain and difficulty urinating.  Musculoskeletal: Denies myalgias,  joint swelling, arthralgias and gait problem.  Skin: Rash as per history of present illness Neurological: Denies dizziness, seizures, syncope, weakness, light-headedness, numbness and headaches.  Psychiatric/Behavioral: Denies suicidal ideation, mood changes, confusion, nervousness, sleep disturbance and agitation  Objective:  Physical Exam: Filed Vitals:   10/17/11 1002  BP: 128/76  Pulse: 72  Temp: 97.7 F (36.5 C)  TempSrc: Oral  Height: 5\' 6"  (1.676 m)  Weight: 206 lb 3.2 oz (93.532 kg)  SpO2: 100%   Constitutional: Vital signs reviewed.  Patient is a well-developed and well-nourished woman in no acute distress and cooperative with exam.  Mouth: no erythema or exudates, MMM Eyes: PERRL, EOMI, conjunctivae normal, No scleral icterus.  Cardiovascular: RRR, S1 normal, S2 normal, no  MRG, pulses symmetric and intact bilaterally Pulmonary/Chest: CTAB, no wheezes, rales, or rhonchi Abdominal: Soft. Non-tender, non-distended, bowel sounds are normal, no masses, organomegaly, or guarding present.  Neurological: A&O x3, Strength is normal and symmetric bilaterally, cranial nerve II-XII are grossly intact, no focal motor deficit, sensory intact to light touch bilaterally.  Skin:  papular, clustered rash on left side of back along T5  dermatome - erythematous, some small vesicals Psychiatric: Normal mood and affect. speech and behavior is normal. Judgment and thought content normal. Cognition and memory are normal.   Assessment & Plan:   Case and care discussed Dr. Eben Burow. Patient to return in approximately one year for routine followup, or sooner if needed. I will call her to followup on her lipids. Please see problem-oriented charting for further details.

## 2011-10-17 NOTE — Telephone Encounter (Signed)
Pt called requesting an appointment.  Last seen 6/25 C/o left side of chest, constant. Rates pain 10/10 Some SOB,  Denies nausea, diaphoresis. No known injury to chest. Went to ED last week for cellulitis and check for chest pain Troponin and EKG done, pt sent home.  Will see now.

## 2011-10-17 NOTE — Patient Instructions (Addendum)
-  Start amitriptyline 25 mg before bed. This medication can cause dry mouth, so to keep a glass of water beside you  -I will call you with results of your lab work - we will discuss if you need to restart a cholesterol medication.  Please be sure to bring all of your medications with you to every visit.  Should you have any new or worsening symptoms, please be sure to call the clinic at [phone].

## 2011-10-17 NOTE — Telephone Encounter (Signed)
Patient seen by Dr Everardo Beals and found to have shigles.  Lars Mage MD

## 2011-10-18 MED ORDER — PRAVASTATIN SODIUM 40 MG PO TABS: 40.0000 mg | ORAL_TABLET | Freq: Every evening | ORAL | Status: AC

## 2011-10-18 NOTE — Addendum Note (Signed)
Addended by: Belia Heman on: 10/18/2011 09:29 AM   Modules accepted: Orders

## 2012-04-01 NOTE — Addendum Note (Signed)
Addended by: Belia Heman on: 04/01/2012 03:20 PM   Modules accepted: Orders

## 2013-03-22 IMAGING — CR DG CHEST 2V
2 series · 2 of 2 positions shown · non-contrast
Comparison: None.

CLINICAL DATA: Chest pain and short of breath.  Insect bite.

CHEST - 2 VIEW

[w chest pa]
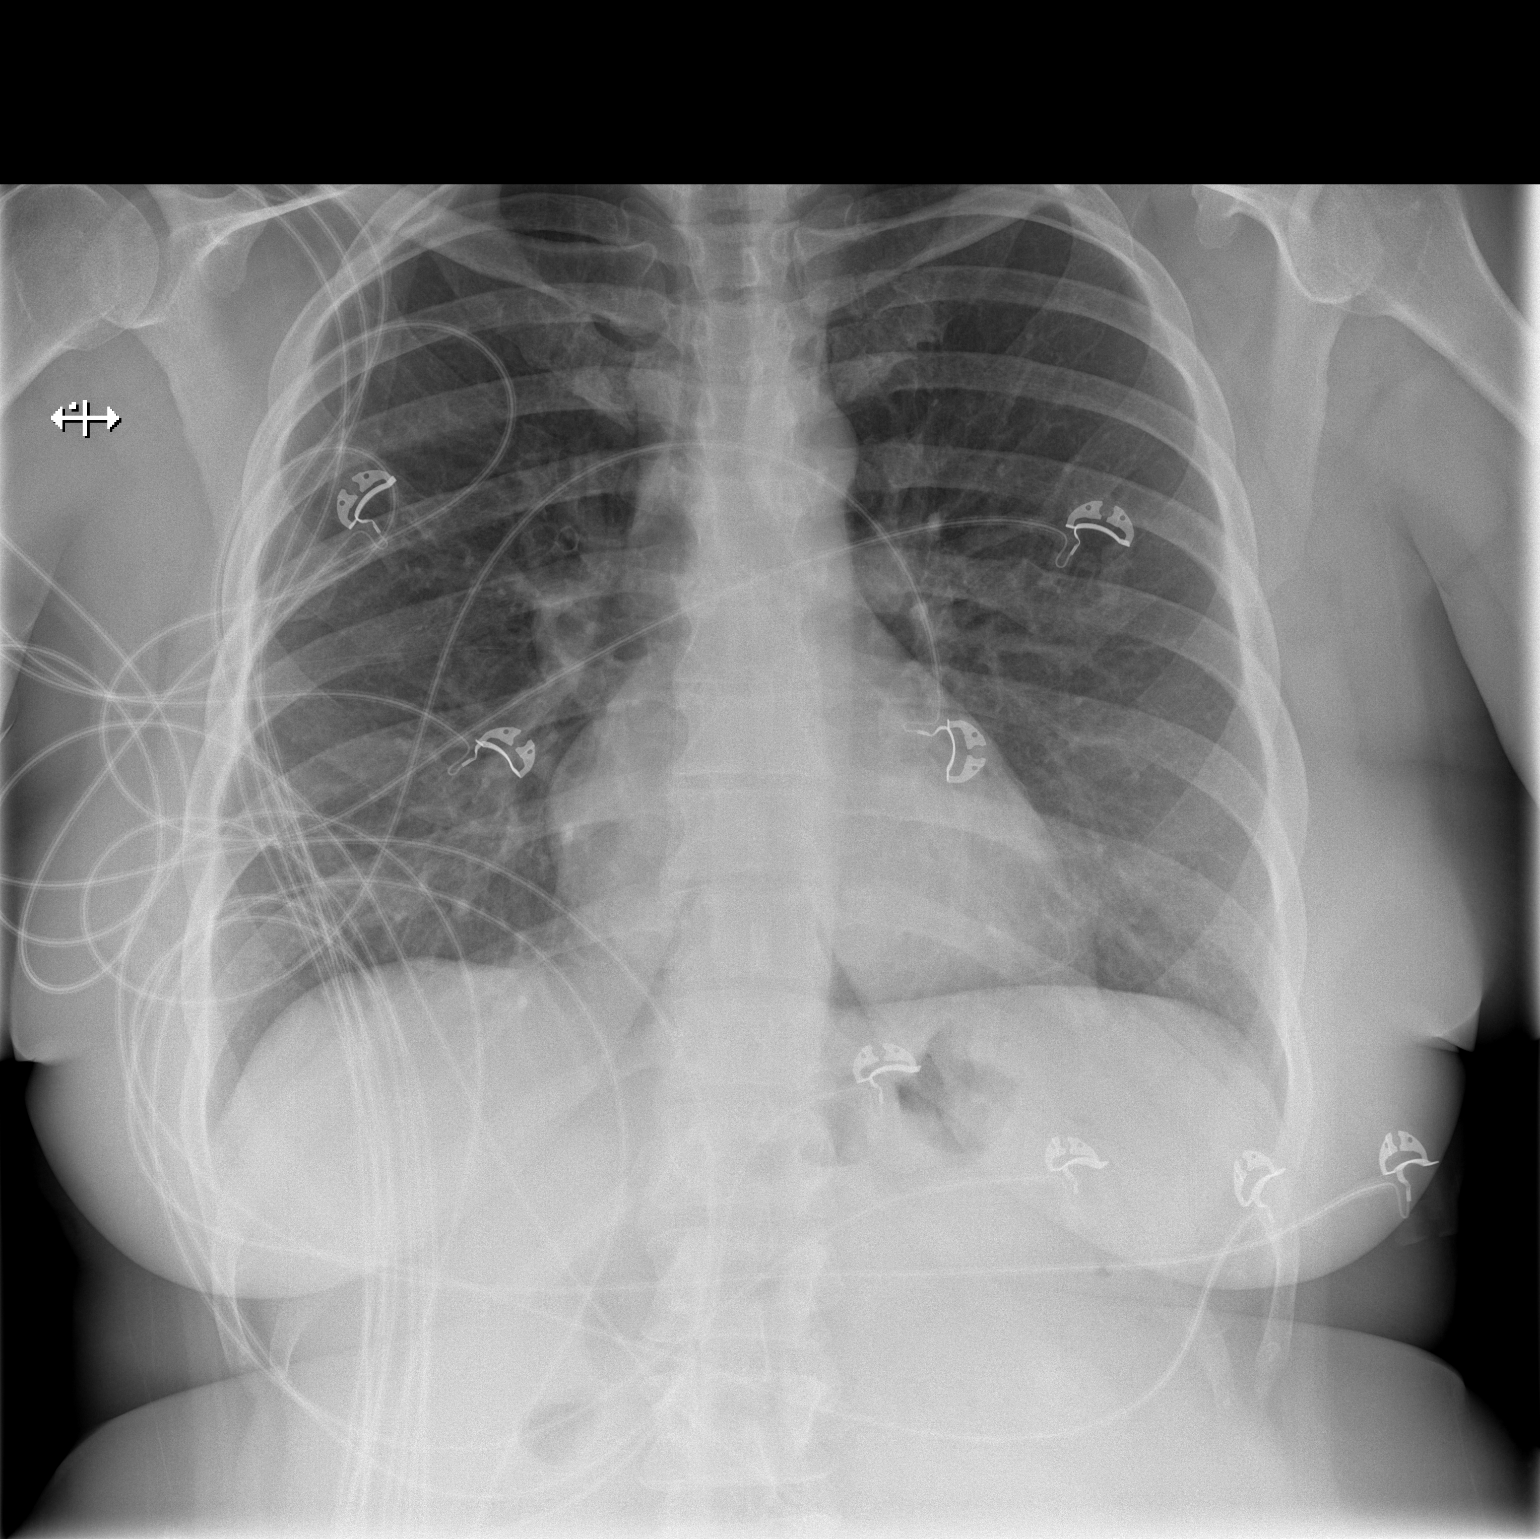

[w chest lat]
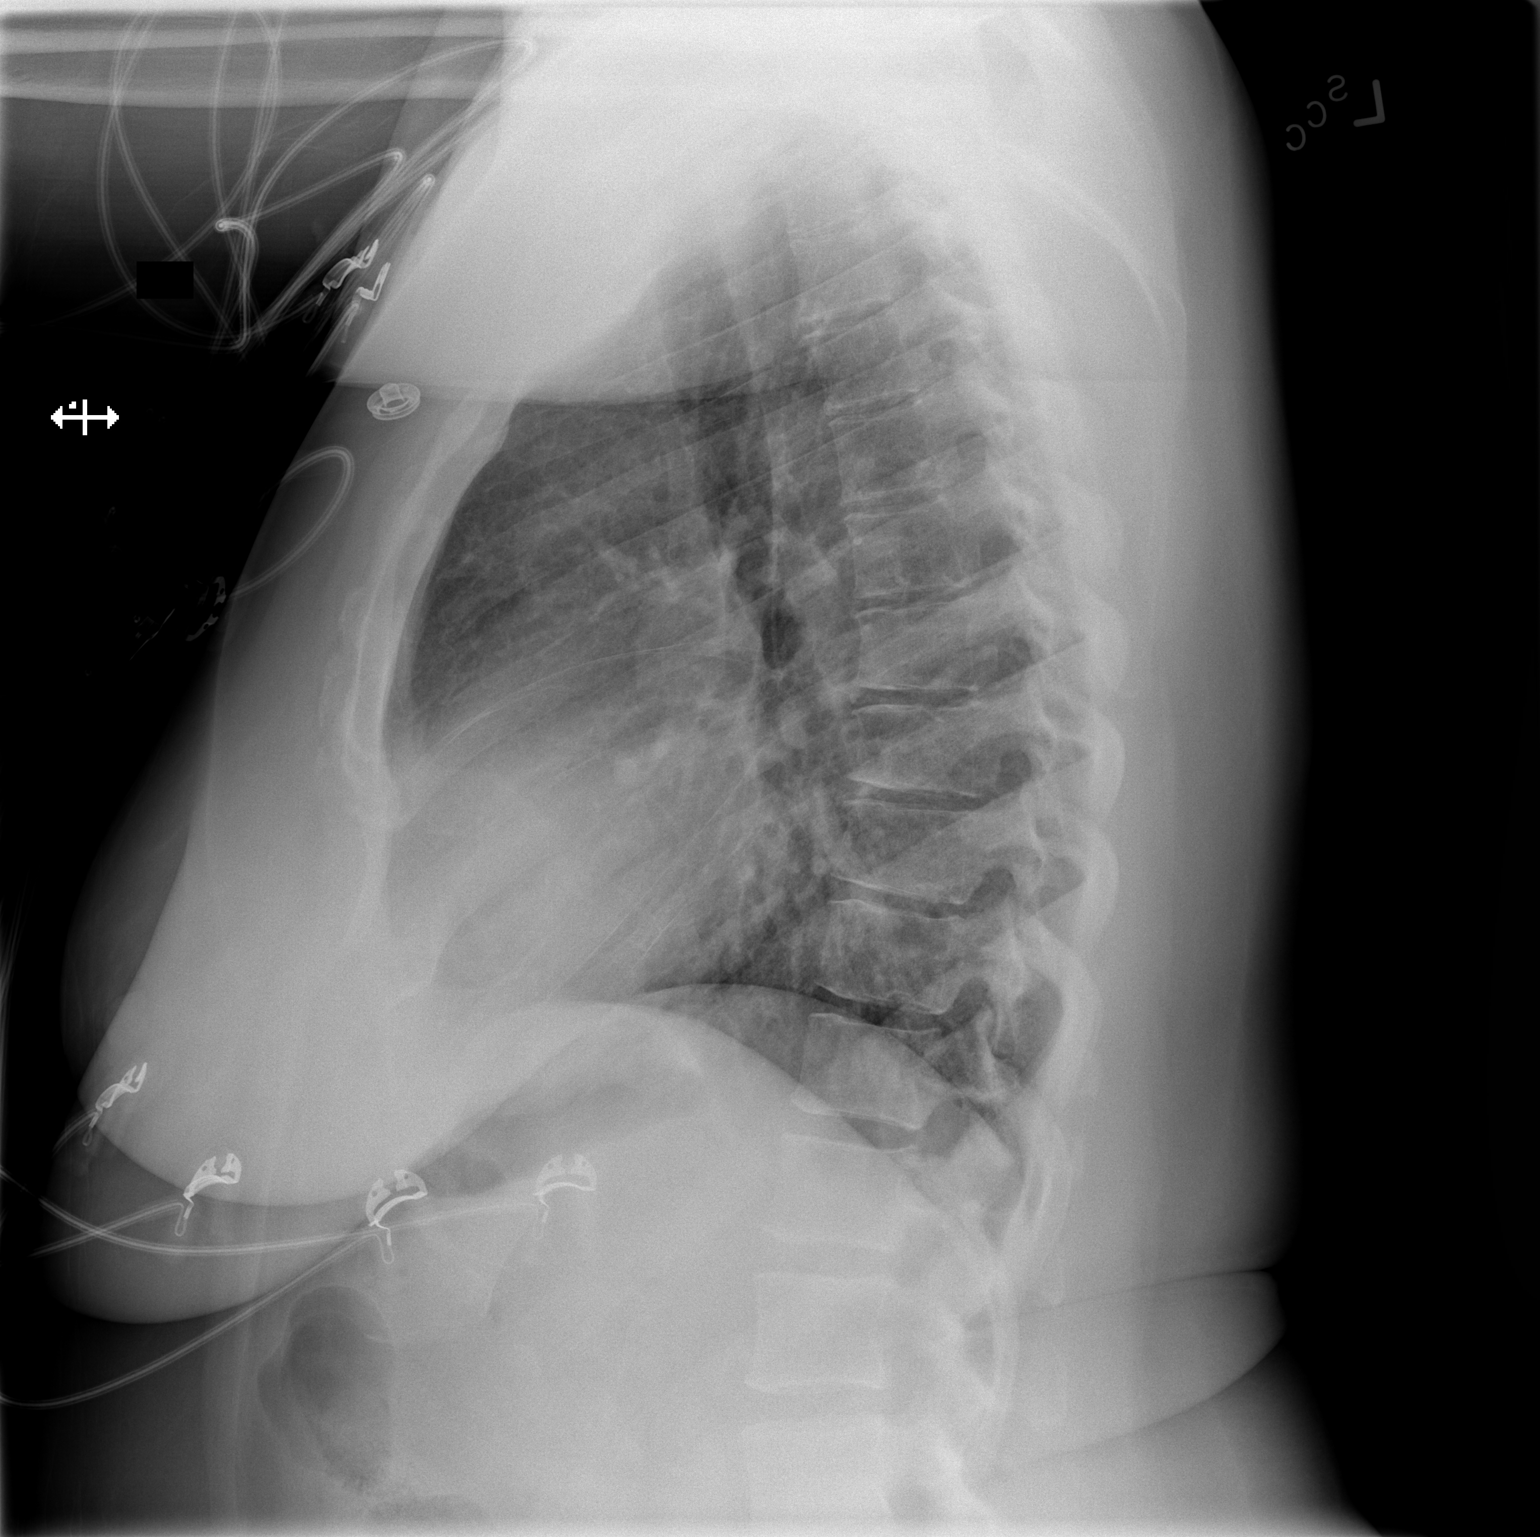

[2 of 2 positions shown; findings below may reference images not displayed]

FINDINGS: Normal heart size.  Clear lungs.  No pleural effusion.
No pneumothorax. Mid level thoracic wedging has a chronic
appearance.
IMPRESSION: No active cardiopulmonary disease.

## 2013-08-12 ENCOUNTER — Ambulatory Visit (HOSPITAL_COMMUNITY)
Admission: RE | Admit: 2013-08-12 | Discharge: 2013-08-12 | Disposition: A | Discharge status: Home / Self Care | Payer: BC Managed Care – PPO | Source: Ambulatory Visit | Attending: Internal Medicine | Admitting: Internal Medicine

## 2013-08-12 ENCOUNTER — Observation Stay (HOSPITAL_COMMUNITY)
Admission: AD | Admit: 2013-08-12 | Discharge: 2013-08-13 | Disposition: A | Discharge status: Home / Self Care | Payer: BC Managed Care – PPO | Source: Ambulatory Visit | Attending: Internal Medicine | Admitting: Internal Medicine

## 2013-08-12 ENCOUNTER — Ambulatory Visit (INDEPENDENT_AMBULATORY_CARE_PROVIDER_SITE_OTHER): Payer: BC Managed Care – PPO | Admitting: Internal Medicine

## 2013-08-12 ENCOUNTER — Encounter: Payer: Self-pay | Admitting: Internal Medicine

## 2013-08-12 ENCOUNTER — Encounter (HOSPITAL_COMMUNITY): Payer: Self-pay | Admitting: General Practice

## 2013-08-12 VITALS — BP 119/84 | HR 85 | Temp 97.9°F | Ht 66.0 in | Wt 207.7 lb

## 2013-08-12 DIAGNOSIS — E669 Obesity, unspecified: Secondary | ICD-10-CM | POA: Insufficient documentation

## 2013-08-12 DIAGNOSIS — F17200 Nicotine dependence, unspecified, uncomplicated: Secondary | ICD-10-CM | POA: Diagnosis present

## 2013-08-12 DIAGNOSIS — R209 Unspecified disturbances of skin sensation: Secondary | ICD-10-CM

## 2013-08-12 DIAGNOSIS — I4891 Unspecified atrial fibrillation: Secondary | ICD-10-CM | POA: Insufficient documentation

## 2013-08-12 DIAGNOSIS — G459 Transient cerebral ischemic attack, unspecified: Secondary | ICD-10-CM | POA: Insufficient documentation

## 2013-08-12 DIAGNOSIS — R29898 Other symptoms and signs involving the musculoskeletal system: Secondary | ICD-10-CM | POA: Diagnosis present

## 2013-08-12 DIAGNOSIS — R200 Anesthesia of skin: Secondary | ICD-10-CM

## 2013-08-12 DIAGNOSIS — M542 Cervicalgia: Secondary | ICD-10-CM | POA: Insufficient documentation

## 2013-08-12 DIAGNOSIS — R202 Paresthesia of skin: Secondary | ICD-10-CM | POA: Insufficient documentation

## 2013-08-12 DIAGNOSIS — Z8673 Personal history of transient ischemic attack (TIA), and cerebral infarction without residual deficits: Secondary | ICD-10-CM | POA: Diagnosis present

## 2013-08-12 DIAGNOSIS — M6281 Muscle weakness (generalized): Principal | ICD-10-CM | POA: Insufficient documentation

## 2013-08-12 DIAGNOSIS — Z0000 Encounter for general adult medical examination without abnormal findings: Secondary | ICD-10-CM

## 2013-08-12 DIAGNOSIS — E782 Mixed hyperlipidemia: Secondary | ICD-10-CM

## 2013-08-12 DIAGNOSIS — E785 Hyperlipidemia, unspecified: Secondary | ICD-10-CM | POA: Diagnosis present

## 2013-08-12 LAB — CBC
HCT: 39.9 % (ref 36.0–46.0)
HEMOGLOBIN: 13 g/dL (ref 12.0–15.0)
MCH: 24.5 pg — AB (ref 26.0–34.0)
MCHC: 32.6 g/dL (ref 30.0–36.0)
MCV: 75.1 fL — AB (ref 78.0–100.0)
PLATELETS: 258 10*3/uL (ref 150–400)
RBC: 5.31 MIL/uL — AB (ref 3.87–5.11)
RDW: 15.4 % (ref 11.5–15.5)
WBC: 6.9 10*3/uL (ref 4.0–10.5)

## 2013-08-12 LAB — COMPLETE METABOLIC PANEL WITH GFR
ALBUMIN: 3.9 g/dL (ref 3.5–5.2)
ALK PHOS: 104 U/L (ref 39–117)
ALT: 17 U/L (ref 0–35)
AST: 17 U/L (ref 0–37)
BUN: 13 mg/dL (ref 6–23)
CO2: 26 mEq/L (ref 19–32)
CREATININE: 0.77 mg/dL (ref 0.50–1.10)
Calcium: 9.7 mg/dL (ref 8.4–10.5)
Chloride: 100 mEq/L (ref 96–112)
GFR, EST NON AFRICAN AMERICAN: 88 mL/min
GFR, Est African American: >89 mL/min
GLUCOSE: 91 mg/dL (ref 70–99)
Potassium: 3.9 mEq/L (ref 3.5–5.3)
Sodium: 140 mEq/L (ref 135–145)
Total Bilirubin: 0.3 mg/dL (ref 0.2–1.2)
Total Protein: 7.8 g/dL (ref 6.0–8.3)

## 2013-08-12 LAB — LIPID PANEL
CHOL/HDL RATIO: 5.2 ratio
CHOLESTEROL: 245 mg/dL — AB (ref 0–200)
HDL: 47 mg/dL (ref >39)
LDL Cholesterol: 169 mg/dL — ABNORMAL HIGH (ref 0–99)
TRIGLYCERIDES: 146 mg/dL (ref <150)
VLDL: 29 mg/dL (ref 0–40)

## 2013-08-12 LAB — HIV ANTIBODY (ROUTINE TESTING W REFLEX): HIV: NONREACTIVE

## 2013-08-12 LAB — TSH
TSH: 1.809 u[IU]/mL (ref 0.350–4.500)
TSH: 1.5 u[IU]/mL (ref 0.350–4.500)

## 2013-08-12 LAB — FOLATE: FOLATE: 10.9 ng/mL

## 2013-08-12 LAB — TROPONIN I: Troponin I: <0.3 ng/mL (ref <0.06)

## 2013-08-12 LAB — VITAMIN B12: Vitamin B-12: 705 pg/mL (ref 211–911)

## 2013-08-12 MED ORDER — PNEUMOCOCCAL VAC POLYVALENT 25 MCG/0.5ML IJ INJ
0.5000 mL | INJECTION | INTRAMUSCULAR | Status: AC
  Administered 2013-08-13: 0.5 mL via INTRAMUSCULAR
  Filled 2013-08-12: qty 0.5

## 2013-08-12 MED ORDER — ASPIRIN EC 81 MG PO TBEC
81.0000 mg | DELAYED_RELEASE_TABLET | Freq: Every day | ORAL | Status: DC
  Administered 2013-08-12 – 2013-08-13 (×2): 81 mg via ORAL
  Filled 2013-08-12 (×2): qty 1

## 2013-08-12 MED ORDER — ASPIRIN EC 81 MG PO TBEC: 81.0000 mg | DELAYED_RELEASE_TABLET | Freq: Every day | ORAL | Status: DC

## 2013-08-12 MED ORDER — ACETAMINOPHEN 325 MG PO TABS: 650.0000 mg | ORAL_TABLET | Freq: Four times a day (QID) | ORAL | Status: DC | PRN

## 2013-08-12 MED ORDER — ENOXAPARIN SODIUM 40 MG/0.4ML ~~LOC~~ SOLN
40.0000 mg | SUBCUTANEOUS | Status: DC
  Administered 2013-08-12: 40 mg via SUBCUTANEOUS
  Filled 2013-08-12 (×2): qty 0.4

## 2013-08-12 MED ORDER — ASPIRIN 325 MG PO TABS
325.0000 mg | ORAL_TABLET | Freq: Once | ORAL | Status: AC
  Administered 2013-08-12: 325 mg via ORAL

## 2013-08-12 MED ORDER — SODIUM CHLORIDE 0.9 % IJ SOLN
3.0000 mL | Freq: Two times a day (BID) | INTRAMUSCULAR | Status: DC
  Administered 2013-08-13: 3 mL via INTRAVENOUS

## 2013-08-12 MED ORDER — SODIUM CHLORIDE 0.9 % IJ SOLN: 3.0000 mL | INTRAMUSCULAR | Status: DC | PRN

## 2013-08-12 MED ORDER — SODIUM CHLORIDE 0.9 % IV SOLN: 250.0000 mL | INTRAVENOUS | Status: DC | PRN

## 2013-08-12 MED ORDER — SIMVASTATIN 40 MG PO TABS
40.0000 mg | ORAL_TABLET | Freq: Every day | ORAL | Status: DC
  Administered 2013-08-12 – 2013-08-13 (×2): 40 mg via ORAL
  Filled 2013-08-12 (×2): qty 1

## 2013-08-12 MED ORDER — ACETAMINOPHEN 650 MG RE SUPP: 650.0000 mg | Freq: Four times a day (QID) | RECTAL | Status: DC | PRN

## 2013-08-12 MED ORDER — SODIUM CHLORIDE 0.9 % IJ SOLN
3.0000 mL | Freq: Two times a day (BID) | INTRAMUSCULAR | Status: DC
Start: 2013-08-12 — End: 2013-08-13
  Administered 2013-08-12: 3 mL via INTRAVENOUS

## 2013-08-12 MED ORDER — NICOTINE 7 MG/24HR TD PT24
7.0000 mg | MEDICATED_PATCH | Freq: Every day | TRANSDERMAL | Status: DC
  Administered 2013-08-12 – 2013-08-13 (×2): 7 mg via TRANSDERMAL
  Filled 2013-08-12 (×2): qty 1

## 2013-08-12 NOTE — H&P (Signed)
Date: 08/12/2013               Patient Name:  Betty Archer MRN: [id number]  DOB: Jun 26, 1960 Age / Sex: 53 y.o., female   PCP: Juluis Mire, MD         Medical Service: Internal Medicine Teaching Service         Attending Physician: Dr. Madilyn Fireman, MD    First Contact: Dr. Joni Reining Pager: [phone]  Second Contact: Dr. Randell Loop Pager: [phone]       After Hours (After 5p/  First Contact Pager: [phone]  weekends / holidays): Second Contact Pager: [phone]   Chief Complaint: right hand "drawing up"  History of Present Illness: Betty Archer is a 53 yo AA female with PMH of HLD, (has history of Afib on chart but takes no medications, is not otherwise aware of A fib from extensive review of EPIC this was added on 10/10/11 by Mariea Clonts, RN, when patient was seen in the ED for shingles and had an EKG that was NSR), Obesity, Tobacco Abuse. 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  She reports she was at work last night doing this job when her right hand "drawed up" by this she means that her hand seemed to take a claw like shape and she could not move it for about 5 minutes.  She reports that she shook her hand and hit it a number of times with the other hand and her normal function returned.  She reports that she went back to work and about 20 minutes later the same thing happened.  She notes that this has happened on at least one occasion a few months ago.  She reports some associated numbness and tingling but cannot give a good description of a specific dermatone, it seems to have happened more so on her dorsal aspect of her arm.  She denies any changes in speech, dizziness, headache, changes in vision, or other neurologic abnormalities.  She denies any chest pain, fever, chills.  She admits 0.5 ppd smoking  history for >20 years, occasional ETOH use, denies any illicit drug use. Meds: Current Facility-Administered Medications  Medication Dose Route Frequency Provider Last Rate Last Dose  . [START ON 08/13/2013] pneumococcal 23 valent vaccine (PNU-IMMUNE) injection 0.5 mL  0.5 mL Intramuscular Tomorrow-1000 Madilyn Fireman, MD        Allergies: Allergies as of 08/12/2013  . (No Known Allergies)   Past Medical History  Diagnosis Date  . Hyperlipidemia   . Atrial fibrillation   . Dysrhythmia     ATRIAL FIBRILATION   Past Surgical History  Procedure Laterality Date  . Breast biopsy       Core biopsy done on April 2003  . Partial hysterectomy   10/22/1999     Still has cervix   Family History  Problem Relation Age of Onset  . Breast cancer Maternal Grandmother   . Breast cancer Maternal Aunt   . Hypertension Mother   . Lung cancer Father   . Colon cancer Neg Hx   . Esophageal cancer Neg Hx   . Stomach cancer Neg Hx    History   Social History  . Marital Status: Single    Spouse Name: N/A    Number of Children: N/A  . Years of  Education: N/A   Occupational History  .  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 Smoking status: Current Every Day Smoker -- 0.50 packs/day for 27 years    Types: Cigarettes  . Smokeless tobacco: Never Used  . Alcohol Use: 0.0 oz/week     Comment:  patient is a social drinker  . Drug Use: No  . Sexual Activity: Not on file   Other Topics Concern  . 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    Review of Systems: Review of Systems  Constitutional: Negative for fever, chills, weight loss and malaise/fatigue.  Eyes: Negative for blurred vision and double vision.  Respiratory: Negative for cough and shortness of breath.   Cardiovascular: Negative for chest pain, palpitations and leg swelling.  Gastrointestinal:  Negative for heartburn, nausea, vomiting, abdominal pain, diarrhea and constipation.  Genitourinary: Negative for dysuria.  Musculoskeletal: Positive for neck pain (occasional). Negative for falls and myalgias.  Neurological: Positive for tingling, sensory change and focal weakness. Negative for dizziness, tremors, speech change, seizures and headaches.  Psychiatric/Behavioral: Negative for substance abuse.     Physical Exam: Blood pressure 131/88, pulse 57, temperature 98.1 F (36.7 C), temperature source Oral, resp. rate 16, SpO2 100.00%. Physical Exam  Nursing note and vitals reviewed. Constitutional: She is oriented to person, place, and time and well-developed, well-nourished, and in no distress.  HENT:  Head: Normocephalic and atraumatic.  Eyes: Conjunctivae and EOM are normal. Pupils are equal, round, and reactive to light.  Neck: Neck supple.  Cardiovascular: Normal rate, regular rhythm, normal heart sounds and intact distal pulses.   No murmur heard. Pulmonary/Chest: Effort normal and breath sounds normal. No respiratory distress. She has no wheezes. She has no rales.  Abdominal: Soft. Bowel sounds are normal. She exhibits no distension. There is no tenderness. There is no rebound and no guarding.  Musculoskeletal:       Right shoulder: She exhibits normal range of motion, no tenderness and no swelling.       Left shoulder: She exhibits normal range of motion, no tenderness and no swelling.       Right wrist: She exhibits swelling. She exhibits normal range of motion and no tenderness.  Neurological: She is alert and oriented to person, place, and time. She has normal sensation, normal strength and intact cranial nerves. She is not disoriented. She displays facial symmetry. No cranial nerve deficit. She has a normal Finger-Nose-Finger Test.     Lab results: Basic Metabolic Panel:  Recent Labs  08/12/13 1029  NA 140  K 3.9  CL 100  CO2 26  GLUCOSE 91  BUN 13    CREATININE 0.77  CALCIUM 9.7   Liver Function Tests:  Recent Labs  08/12/13 1029  AST 17  ALT 17  ALKPHOS 104  BILITOT 0.3  PROT 7.8  ALBUMIN 3.9   CBC:  Recent Labs  08/12/13 1029  WBC 6.9  HGB 13.0  HCT 39.9  MCV 75.1*  PLT 258   Cardiac Enzymes:  Recent Labs  08/12/13 1029  TROPONINI <0.30   Fasting Lipid Panel:  Recent Labs  08/12/13 1029  CHOL 245*  HDL 47  LDLCALC 169*  TRIG 146  CHOLHDL 5.2   Thyroid Function Tests:  Recent Labs  08/12/13 1029  TSH 1.809  Anemia Panel:  Recent Labs  08/12/13 1029  VITAMINB12 705  FOLATE 10.9    Imaging results:  Ct Head Wo Contrast  08/12/2013   CLINICAL DATA:  Numbness, possible TIA, right arm tingling  EXAM: CT HEAD WITHOUT CONTRAST  TECHNIQUE: Contiguous axial images were obtained from the base of the skull through the vertex without intravenous contrast.  COMPARISON:  None.  FINDINGS: No skull fracture is noted. Paranasal sinuses and mastoid air cells are unremarkable. No hydrocephalus. No intracranial hemorrhage, mass effect or midline shift. No acute infarction. No mass lesion is noted on this unenhanced scan. Gray and white-matter differentiation is preserved.  IMPRESSION: No acute intracranial abnormality.  No definite acute infarction.   Electronically Signed   By: Lahoma Crocker M.D.   On: 08/12/2013 11:41    Other results: EKG: NSR, normal axis, TWI in leads II, III, V2-V6 mostly unchanged from previous EKG dated 10-10-11 which had TWI in leads III, aVF, V3-V6  Assessment & Plan by Problem:   Weakness of limb- Transient/ Resolved - Patient with unusual presentation for TIA/ CVA, her risk factors for stroke include tobacco abuse and HLD.  Her ABCD2 score is 2 indicating a low risk of progression to stroke in the near future. She does also have a history of right rotator cuff injury and her clinical story is somewhat consistent with carpal tunnel (although would not cause weakness of entire arm).   Her neck pain could also represent a cervical radiculopathy although she denies any radicular pains. - Admit for observation - Monitor on telemetry - Consult neurology  - Repeat EKG in AM ( trop neg, TWI but mostly unchanged from previous) -ASA 81mg  - Simvastatin 40mg   Tobacco Abuse - Discussed importance of cessation - Nicotine 7mg  patch  Note: There is no evidence that patient has ever had the diagnosis of Afib, I have removed this from the history to avoid confusion.  DVT: Heparin Sharon Diet: Regular Code: Full Dispo: Disposition is deferred at this time, awaiting improvement of current medical problems. Anticipated discharge in approximately 1 day(s).   The patient does have a current PCP (Juluis Mire, MD) and does need an [facility] hospital follow-up appointment after discharge.  The patient does not have transportation limitations that hinder transportation to clinic appointments.  Signed: Joni Reining, DO 08/12/2013, 3:17 PM

## 2013-08-12 NOTE — Assessment & Plan Note (Signed)
Assessment: Pt with history of tobacco use since age 53, currently smoking 0.5 pack cigarettes daily with no plans to quit.  Plan: -Encourage tobacco cessation

## 2013-08-12 NOTE — Consult Note (Signed)
Referring Physician: Ellwood Dense    Chief Complaint: right arm flaccidity and numbness  HPI:                                                                                                                                         Betty Archer is an 53 y.o. female who was at work today when she had two episodes consisting of her "right hand flexing at her fingers then going numb for 5 minutes. This occured a second time and fully resolved.  Currently she is back to her baseline. She denies any other associated symptoms.   Date last known well: Date: 08/12/2013 Time last known well: Unable to determine tPA Given: No: symptoms fully resolved  Past Medical History  Diagnosis Date  . Hyperlipidemia     Past Surgical History  Procedure Laterality Date  . Breast biopsy       Core biopsy done on April 2003  . Partial hysterectomy   10/22/1999     Still has cervix    Family History  Problem Relation Age of Onset  . Breast cancer Maternal Grandmother   . Breast cancer Maternal Aunt   . Hypertension Mother   . Lung cancer Father   . Colon cancer Neg Hx   . Esophageal cancer Neg Hx   . Stomach cancer Neg Hx    Social History:  reports that she has been smoking Cigarettes.  She has a 13.5 pack-year smoking history. She has never used smokeless tobacco. She reports that she drinks alcohol. She reports that she does not use illicit drugs.  Allergies: No Known Allergies  Medications:                                                                                                                           Prior to Admission:  No prescriptions prior to admission   Scheduled: . enoxaparin (LOVENOX) injection  40 mg Subcutaneous Q24H  . [START ON 08/13/2013] pneumococcal 23 valent vaccine  0.5 mL Intramuscular Tomorrow-1000  . simvastatin  40 mg Oral q1800  . sodium chloride  3 mL Intravenous Q12H  . sodium chloride  3 mL Intravenous Q12H    ROS:  History obtained from the patient  General ROS: negative for - chills, fatigue, fever, night sweats, weight gain or weight loss Psychological ROS: negative for - behavioral disorder, hallucinations, memory difficulties, mood swings or suicidal ideation Ophthalmic ROS: negative for - blurry vision, double vision, eye pain or loss of vision ENT ROS: negative for - epistaxis, nasal discharge, oral lesions, sore throat, tinnitus or vertigo Allergy and Immunology ROS: negative for - hives or itchy/watery eyes Hematological and Lymphatic ROS: negative for - bleeding problems, bruising or swollen lymph nodes Endocrine ROS: negative for - galactorrhea, hair pattern changes, polydipsia/polyuria or temperature intolerance Respiratory ROS: negative for - cough, hemoptysis, shortness of breath or wheezing Cardiovascular ROS: negative for - chest pain, dyspnea on exertion, edema or irregular heartbeat Gastrointestinal ROS: negative for - abdominal pain, diarrhea, hematemesis, nausea/vomiting or stool incontinence Genito-Urinary ROS: negative for - dysuria, hematuria, incontinence or urinary frequency/urgency Musculoskeletal ROS: negative for - joint swelling or muscular weakness Neurological ROS: as noted in HPI Dermatological ROS: negative for rash and skin lesion changes  Neurologic Examination:                                                                                                      Blood pressure 131/88, pulse 57, temperature 98.1 F (36.7 C), temperature source Oral, resp. rate 16, SpO2 100.00%.   Mental Status: Alert, oriented, thought content appropriate.  Speech fluent without evidence of aphasia.  Able to follow 3 step commands without difficulty. Cranial Nerves: II: Discs flat bilaterally; Visual fields grossly normal, pupils equal, round, reactive to light and accommodation III,IV, VI: ptosis  not present, extra-ocular motions intact bilaterally V,VII: smile symmetric, facial light touch sensation normal bilaterally VIII: hearing normal bilaterally IX,X: gag reflex present XI: bilateral shoulder shrug XII: midline tongue extension without atrophy or fasciculations  Motor: Right : Upper extremity   5/5    Left:     Upper extremity   5/5  Lower extremity   5/5     Lower extremity   5/5 Tone and bulk:normal tone throughout; no atrophy noted Sensory: Pinprick and light touch intact throughout, bilaterally Deep Tendon Reflexes:  Right: Upper Extremity   Left: Upper extremity   biceps (C-5 to C-6) 2/4   biceps (C-5 to C-6) 2/4 tricep (C7) 2/4    triceps (C7) 2/4 Brachioradialis (C6) 2/4  Brachioradialis (C6) 2/4  Lower Extremity Lower Extremity  quadriceps (L-2 to L-4) 2/4   quadriceps (L-2 to L-4) 2/4 Achilles (S1) 2/4   Achilles (S1) 2/4  Plantars: Right: downgoing   Left: downgoing Cerebellar: normal finger-to-nose,  normal heel-to-shin test Gait: Normal CV: pulses palpable throughout    Lab Results: Basic Metabolic Panel:  Recent Labs Lab 08/12/13 1029  NA 140  K 3.9  CL 100  CO2 26  GLUCOSE 91  BUN 13  CREATININE 0.77  CALCIUM 9.7    Liver Function Tests:  Recent Labs Lab 08/12/13 1029  AST 17  ALT 17  ALKPHOS 104  BILITOT 0.3  PROT 7.8  ALBUMIN 3.9   No results found for this  basename: LIPASE, AMYLASE,  in the last 168 hours No results found for this basename: AMMONIA,  in the last 168 hours  CBC:  Recent Labs Lab 08/12/13 1029  WBC 6.9  HGB 13.0  HCT 39.9  MCV 75.1*  PLT 258    Cardiac Enzymes:  Recent Labs Lab 08/12/13 1029  TROPONINI <0.30    Lipid Panel:  Recent Labs Lab 08/12/13 1029  CHOL 245*  TRIG 146  HDL 47  CHOLHDL 5.2  VLDL 29  LDLCALC 169*    CBG: No results found for this basename: GLUCAP,  in the last 168 hours  Microbiology: Results for orders placed in visit on 08/15/10  WET PREP BY  MOLECULAR PROBE     Status: None   Collection Time    08/15/10  3:12 PM      Result Value Ref Range Status   Candida species NEG  Negative Final   Trichomonas vaginosis NEG  Negative Final   Gardnerella vaginalis NEG  Negative Final   Comment:       Performed at:  [facility] [address], [place] [address]                    [id number]    Coagulation Studies: No results found for this basename: LABPROT, INR,  in the last 72 hours  Imaging: Ct Head Wo Contrast  08/12/2013   CLINICAL DATA:  Numbness, possible TIA, right arm tingling  EXAM: CT HEAD WITHOUT CONTRAST  TECHNIQUE: Contiguous axial images were obtained from the base of the skull through the vertex without intravenous contrast.  COMPARISON:  None.  FINDINGS: No skull fracture is noted. Paranasal sinuses and mastoid air cells are unremarkable. No hydrocephalus. No intracranial hemorrhage, mass effect or midline shift. No acute infarction. No mass lesion is noted on this unenhanced scan. Gray and white-matter differentiation is preserved.  IMPRESSION: No acute intracranial abnormality.  No definite acute infarction.   Electronically Signed   By: Lahoma Crocker M.D.   On: 08/12/2013 11:41       Assessment and plan discussed with with attending physician and they are in agreement.    Etta Quill PA-C [facility] [phone]  08/12/2013, 4:27 PM   Assessment: 53 y.o. female with two episodes of transient numbness and weakness and hand drawing up on the right side. Patient is fully back to her baseline.  Given her age and risk factors of hyperlipidemia cannot rule out possible MS versus TIA/Stroke.   Stroke Risk Factors - hyperlipidemia  1. HgbA1c, fasting lipid panel 2. MRI brain with contrast, MRA  of the brain without contrast 3. PT consult, OT consult, Speech consult 4. Echocardiogram 5. Carotid  dopplers 6. Prophylactic therapy-Antiplatelet med: Aspirin - dose 81 mg daily 7. Risk factor modification 8. Telemetry monitoring 9. Frequent neuro checks  Patient seen and examined together with physician assistant and I concur with the assessment and plan.  Dorian Pod, MD

## 2013-08-12 NOTE — Patient Instructions (Signed)
General Instructions:   Please bring your medicines with you each time you come to clinic.  Medicines may include prescription medications, over-the-counter medications, herbal remedies, eye drops, vitamins, or other pills.   Progress Toward Treatment Goals:  No flowsheet data found.  Self Care Goals & Plans:  Self Care Goal 08/12/2013  Manage my medications take my medicines as prescribed  Stop smoking call [facility] (1-800-QUIT-NOW); go to the [employer] ([email or web address]    No flowsheet data found.   Care Management & Community Referrals:  No flowsheet data found.

## 2013-08-12 NOTE — Progress Notes (Signed)
Patient ID: Betty Archer, female   DOB: Aug 18, 1960, 53 y.o.   MRN: [id number]     Subjective:   Patient ID: Betty Archer female   DOB: 14-Dec-1960 53 y.o.   MRN: [id number]  HPI: [name] Ip Peffley is a 53 y.o. woman with past medical history of atrial fibrillation (?), hyperlipidemia, and tobacco abuse who presents with chief complaint of numbness and tingling of her right arm/hand.  Pt reports having a right shoulder injury at her job in 2010 with possible rotator cuff injury. She was seen by PT at that time and had therapy in addition to corticosteroid injection of her right shoulder. She reports pain in her right shoulder and numbness/tingling in her right forearm and hand that occurs every few months. She repots mild weakness in her right hand with difficulty grasping objects. She denies neck pain but does report neck spasms at times. She denies recent trauma, injury, or fall.   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 Last night around 9 PM, she had an episode and right hand/forarm numbness/tingling with weakness and cramping of her right hand that lasted for 5 minutes and then reoccurred about 20-25 minutes later.  She was unable to use her hand during these episodes. She denies speech/vision changes, weakness or paresthesias in other parts of her body, difficulty ambulating, headache, dizziness, or AMS. She denies prior history of stroke or TIA but reports her mother had died of a stroke.   She is unaware of history of atrial fibrillation and denies recent chest pain, palpitations, or syncope.  She does not take aspirin and was previously on cholesterol medication but ran out a long time ago. She smokes 0.5 pack a day since age 41. She denies prior or current drug abuse.       Past Medical History  Diagnosis Date  . Hyperlipidemia   . Atrial fibrillation    No current outpatient prescriptions on file.   No  current facility-administered medications for this visit.   Family History  Problem Relation Age of Onset  . Breast cancer Maternal Grandmother   . Breast cancer Maternal Aunt   . Hypertension Mother   . Lung cancer Father   . Colon cancer Neg Hx   . Esophageal cancer Neg Hx   . Stomach cancer Neg Hx    History   Social History  . Marital Status: Single    Spouse Name: N/A    Number of Children: N/A  . Years of Education: N/A   Occupational History  .  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 Smoking status: Current Every Day Smoker -- 0.50 packs/day    Types: Cigarettes  . Smokeless tobacco: Never Used  . Alcohol Use: 0.0 oz/week     Comment:  patient is a social drinker  . Drug Use: No  . Sexual Activity: None   Other Topics Concern  . 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   Review of Systems: Review of Systems  Constitutional: Negative for fever, chills, weight loss and malaise/fatigue.  Eyes: Negative for blurred vision and double vision.  Respiratory: Negative for cough, shortness of breath and wheezing.   Cardiovascular: Negative for chest pain, palpitations and leg  swelling.  Gastrointestinal: Negative for nausea, vomiting, abdominal pain, diarrhea and constipation.  Genitourinary: Negative for dysuria, urgency and frequency.  Musculoskeletal: Positive for neck pain (occasional spasms). Negative for back pain, falls, joint pain and myalgias.  Neurological: Positive for tingling, sensory change and focal weakness (in right arm that resolved). Negative for dizziness, speech change, weakness and headaches.  Psychiatric/Behavioral: Positive for memory loss (short-term). Negative for substance abuse.    Objective:  Physical Exam: Filed Vitals:   08/12/13 0852  BP: 119/84  Pulse: 85  Temp: 97.9 F (36.6 C)  TempSrc: Oral    Height: 5\' 6"  (1.676 m)  Weight: 207 lb 11.2 oz (94.212 kg)  SpO2: 98%   Physical Exam  Constitutional: She is oriented to person, place, and time. She appears well-developed and well-nourished. No distress.  HENT:  Head: Normocephalic and atraumatic.  Right Ear: External ear normal.  Left Ear: External ear normal.  Nose: Nose normal.  Mouth/Throat: Oropharynx is clear and moist. No oropharyngeal exudate.  Eyes: Conjunctivae and EOM are normal. Pupils are equal, round, and reactive to light. Right eye exhibits no discharge. Left eye exhibits no discharge. No scleral icterus.  Neck: Normal range of motion. Neck supple.  Cardiovascular: Normal rate, regular rhythm and normal heart sounds.   Pulmonary/Chest: Effort normal and breath sounds normal. No respiratory distress. She has no wheezes. She has no rales.  Abdominal: Soft. Bowel sounds are normal. She exhibits no distension. There is no tenderness. There is no rebound and no guarding.  Musculoskeletal: Normal range of motion. She exhibits no edema and no tenderness.  Phalen's test negative   Neurological: She is alert and oriented to person, place, and time. No cranial nerve deficit. Coordination normal.  Normal 5/5 muscle strength Normal sensation to light touch No dysarthria   Skin: Skin is warm and dry. No rash noted. She is not diaphoretic. No erythema. No pallor.  Psychiatric: She has a normal mood and affect. Her behavior is normal. Judgment and thought content normal.    Assessment & Plan:   Please see problem list for problem-based assessment and plan

## 2013-08-12 NOTE — Assessment & Plan Note (Addendum)
Assessment: Pt with history of questionable atrial fibrillation with CHADS2 score of 0 not currently on AP therapy with previous right shoulder injury (rotator cuff ?) in 2010 with residual paraesthesias in right arm/hand who presents with acute onset right arm/hand paraesthesia and paresis 1 day ago with resolution of symptoms concerning for TIA. Etiology most likely due to previous right shoulder injury and possible neck pathology.   Plan:  -Admit to inpatient (telemetry) for overnight observation  -Obtain stat CT head w/o contrast -Obtain 12-lead EKG and troponin -Obtain neuropathy work-up, CMP, CBC, TSH, A1c, B12, folate, HIV Ab  -Obtain lipid panel and consider starting moderate to high intensity dose statin therapy  -Consider 81 mg daily aspirin in setting of questionable atrial fibrillation   -Consider sports medicine referral and neck imaging

## 2013-08-12 NOTE — Care Management Note (Signed)
    Page 1 of 1   08/12/2013     4:00:52 PM CARE MANAGEMENT NOTE 08/12/2013  Patient:  Betty Archer, Betty Archer   Account Number:  [id number]  Date Initiated:  08/12/2013  Documentation initiated by:  GRAVES-BIGELOW,Micayla Brathwaite  Subjective/Objective Assessment:   Pt admitted  and consult placed for medication assistance. Pt works part time with [employer].     Action/Plan:   CM did speak to pt in ref to meds and she is taking a statin. MD Please make sure pt is on a generic statin preferably the [facility] $4.00 list once medically stable for d/c. Pt has insurance therefore CM unable to assist with meds.   Anticipated DC Date:  08/13/2013   Anticipated DC Plan:  [facility]  Medication Assistance      Choice offered to / List presented to:             Status of service:  Completed, signed off Medicare Important Message given?  NO (If response is "NO", the following Medicare IM given date fields will be blank) Date Medicare IM given:   Date Additional Medicare IM given:    Discharge Disposition:  HOME/SELF CARE  Per UR Regulation:  Reviewed for med. necessity/level of care/duration of stay  If discussed at [place] of Stay Meetings, dates discussed:    Comments:

## 2013-08-12 NOTE — Progress Notes (Signed)
I saw and evaluated the patient and discussed her care with resident Dr. Naaman Plummer.  Patient presents with complaint of an episode of her right forearm and hand "drawing", during which she was unable to use her right hand, which occurred last night at work, lasted about 5 minutes, and then resolved.  She had two more similar occurrences last night, both of which resolved.  Her strength appears intact on exam.  The medical history mentions atrial fibrillation, but we can find no other documentation in chart.  Symptoms are consistent with TIA.  I agree with plan to admit for evaluation and treatment.

## 2013-08-12 NOTE — Assessment & Plan Note (Addendum)
Assessment: Pt with last lipid panel on 10/17/11 with hypercholesteremia previously on statin therapy with 10-yr risk of 8.4% and lifetime risk of 39% with recommendation to start moderate to high intensity statin therapy.   Plan:  -Obtain lipid panel -Encourage lifestyle modification  -Consider starting moderate intensity statin

## 2013-08-12 NOTE — H&P (Signed)
IM TEACHING ATTENDING HISTORY AND PHYSICAL  Day 0 of stay  Patient name: Betty Archer  MRN: [id number] Date of birth: 1960/04/26   History of Present Illness                                                     53 y.o.female with history of hyperlipidemia and right shoulder injury reported to the clinic this morning with complaint of her right hand, and I quote, "drawing up on her". On asking for further clarification she explains that she felt that her right arm flopped on her side and she had to lift it up with her other arm. This happened for 5-7 minutes and then she could feel her limb coming back to normal function. After 20-25 minutes and the event repeated itself in exactly the same fashion. She denies experiencing any chest pain, palpitation, presyncope or syncope, no verbal deficits, no weakness in any other extremity, no seizures, no head injury. She gives variable history about experiencing numbness and tingling - its present intermittently to the best of my understanding. Such an episode has happened before, 3-4 months back as well. However, these paralysis/weakness episodes have started only post right shoulder injury in 2010. The patient at present is not experiencing any symptom relating to TIA. She was admitted from the clinic to the hospital as a direct admit, by Dr Marinda Elk and Dr Naaman Plummer.       Past Medical History                                                                Hyperlipidemia for which she was started on Pravastatin which she stopped taking because she ran out and never asked for refills.   I would strike Atrial Fibrillation out of her history. It has been mention in the past history - However the patient does not know anything about it, and there are no references in the past for it. It was entered in the history of the patient on 10/10/2011 by an ED nurse, however the EKG from that day is NSR.  Right shoulder injury 01/10/2009 - rotator cuff injury per ED note.  Follow up advised with ortho for which we do not have any notes. ED Xray at that time did not show any abnormalities in the right shoulder.   Total hysterectomy - note from 02/06/2000   Urinary Tract Infection 2007  Migraine with aura  Obesity  Tobacco dependence   Social History                                                                            She is a current every day smoker -0.5 packs per day since the age of 74. She is a social drinker.  She has 3 healthy sons.    Family History  She has a family history of breast cancer, hypertension and lung cancer.   Family History                                                                            She currently takes no medications   Vitals and Exam                                                                           Filed Vitals:   08/12/13 1300  BP: 131/88  Pulse: 57  Temp: 98.1 F (36.7 C)  TempSrc: Oral  Resp: 16  SpO2: 100%    General: Resting in bed. No acute distress. Obese. HEENT: PERRL, EOMI, no scleral icterus. Heart: RRR, no rubs, murmurs or gallops. Lungs: Clear to auscultation bilaterally, no wheezes, rales, or rhonchi. Abdomen: Soft, nontender, nondistended, BS present. Extremities: Warm, no pedal edema. Right wrist mildly swollen (has IV cannula). Phalen and Tinel tests negative.  Neuro: Alert and oriented X3, cranial nerves II-XII grossly intact,  strength and sensation to light touch equal in bilateral upper and lower extremities. Normal plantar response.    Blood Work and Imaging                                                            Recent Labs Lab 08/12/13 1029  NA 140  K 3.9  CL 100  CO2 26  GLUCOSE 91  BUN 13  CREATININE 0.77  CALCIUM 9.7    Recent Labs Lab 08/12/13 1029  AST 17  ALT 17  ALKPHOS 104  BILITOT 0.3  PROT 7.8  ALBUMIN 3.9   Lipid Panel CHOL 245*  TRIG 146  HDL 47   CHOLHDL 5.2  VLDL 29  LDLCALC 169*    CT Scan Impression No acute intracranial abnormality. No definite acute infarction.  EKG Normal Sinus Rhythm. New TWI in V2-V6.   Assessment and Plan                                                          53 year old lady with recent (1 day old) history of right upper extremity weakness experienced for less than 10 minutes, however on 2 separate occassions during the same day. Unsure if this is a true TIA, or a sequale from past right shoulder injury. We do not have records for this lady's shoulder injury. We also do not have evidence that she has atrial fibrillation, since we find the entry in the chart to be disputed.   Regarding admission criteria, here ABCD2 score  is 2. There is recommendation from AHA/ASA that patient whose ABCD2 score is less than 2 can be admitted to the hospital if their diagnostic work up cannot be completed within 2 days as outpatient.   I recommend to my team to start this patient on Aspirin and Statin, and call neurology for further advice. She has new TWI in V2-V6. We will do a set of troponins and repeat EKG to trend this out.  I have seen and evaluated this patient and discussed it with my IM resident team - Dr Heber Taft [place] and Dr Hayes Ludwig. Please see the rest of the plan per resident note from today.   Madilyn Fireman 08/12/2013, 3:57 PM.

## 2013-08-12 NOTE — Assessment & Plan Note (Signed)
-  Discuss pap smear and mammogram at next vist

## 2013-08-13 ENCOUNTER — Observation Stay (HOSPITAL_COMMUNITY): Payer: BC Managed Care – PPO

## 2013-08-13 ENCOUNTER — Other Ambulatory Visit: Payer: Self-pay

## 2013-08-13 ENCOUNTER — Encounter: Payer: Self-pay | Admitting: Internal Medicine

## 2013-08-13 DIAGNOSIS — I517 Cardiomegaly: Secondary | ICD-10-CM

## 2013-08-13 DIAGNOSIS — G459 Transient cerebral ischemic attack, unspecified: Secondary | ICD-10-CM

## 2013-08-13 LAB — BASIC METABOLIC PANEL
BUN: 13 mg/dL (ref 6–23)
CALCIUM: 9.3 mg/dL (ref 8.4–10.5)
CHLORIDE: 105 meq/L (ref 96–112)
CO2: 24 meq/L (ref 19–32)
Creatinine, Ser: 0.71 mg/dL (ref 0.50–1.10)
GFR calc Af Amer: >90 mL/min (ref >90)
GFR calc non Af Amer: >90 mL/min (ref >90)
GLUCOSE: 95 mg/dL (ref 70–99)
Potassium: 4.2 mEq/L (ref 3.7–5.3)
Sodium: 142 mEq/L (ref 137–147)

## 2013-08-13 LAB — HEMOGLOBIN A1C
Hgb A1c MFr Bld: 5.7 % — ABNORMAL HIGH (ref <5.7)
Mean Plasma Glucose: 117 mg/dL — ABNORMAL HIGH (ref <117)

## 2013-08-13 MED ORDER — NICOTINE 7 MG/24HR TD PT24: 7.0000 mg | MEDICATED_PATCH | Freq: Every day | TRANSDERMAL | Status: DC

## 2013-08-13 MED ORDER — GADOBENATE DIMEGLUMINE 529 MG/ML IV SOLN
20.0000 mL | Freq: Once | INTRAVENOUS | Status: AC
  Administered 2013-08-13: 20 mL via INTRAVENOUS

## 2013-08-13 MED ORDER — ASPIRIN 81 MG PO TBEC: 81.0000 mg | DELAYED_RELEASE_TABLET | Freq: Every day | ORAL | Status: DC

## 2013-08-13 MED ORDER — SIMVASTATIN 40 MG PO TABS: 40.0000 mg | ORAL_TABLET | Freq: Every day | ORAL | Status: DC

## 2013-08-13 NOTE — Progress Notes (Signed)
Subjective: Patient reports feeling well, no further episodes of hand spasm or weakness.  No acute complaints. Objective: Vital signs in last 24 hours: Filed Vitals:   08/12/13 1300 08/12/13 2134 08/13/13 0514  BP: 131/88 116/61 101/49  Pulse: 57 55 57  Temp: 98.1 F (36.7 C) 98.5 F (36.9 C) 98.3 F (36.8 C)  TempSrc: Oral Oral Oral  Resp: 16 16 16   Height: 5\' 6"  (1.676 m)    Weight: 207 lb 14.3 oz (94.3 kg)    SpO2: 100% 98% 100%   Weight change:   Intake/Output Summary (Last 24 hours) at 08/13/13 1005 Last data filed at 08/13/13 0900  Gross per 24 hour  Intake    573 ml  Output      0 ml  Net    573 ml   General: resting in bed HEENT: PERRL, EOMI, no scleral icterus Cardiac: RRR, no rubs, murmurs or gallops Pulm: CTAB Abd: soft, nontender, nondistended, + BS Ext: warm and well perfused, no pedal edema Neuro: alert and oriented X3, cranial nerves II-XII grossly intact, strength intact of bilateral upper extremities Lab Results: Basic Metabolic Panel:  Recent Labs Lab 08/12/13 1029 08/13/13 0350  NA 140 142  K 3.9 4.2  CL 100 105  CO2 26 24  GLUCOSE 91 95  BUN 13 13  CREATININE 0.77 0.71  CALCIUM 9.7 9.3   Liver Function Tests:  Recent Labs Lab 08/12/13 1029  AST 17  ALT 17  ALKPHOS 104  BILITOT 0.3  PROT 7.8  ALBUMIN 3.9   No results found for this basename: LIPASE, AMYLASE,  in the last 168 hours No results found for this basename: AMMONIA,  in the last 168 hours CBC:  Recent Labs Lab 08/12/13 1029  WBC 6.9  HGB 13.0  HCT 39.9  MCV 75.1*  PLT 258   Cardiac Enzymes:  Recent Labs Lab 08/12/13 1029  TROPONINI <0.30   Hemoglobin A1C:  Recent Labs Lab 08/12/13 1659  HGBA1C 5.7*   Fasting Lipid Panel:  Recent Labs Lab 08/12/13 1029  CHOL 245*  HDL 47  LDLCALC 169*  TRIG 146  CHOLHDL 5.2   Thyroid Function Tests:  Recent Labs Lab 08/12/13 1658  TSH 1.500   Anemia Panel:  Recent Labs Lab 08/12/13 1029    VITAMINB12 705  FOLATE 10.9    Micro Results: No results found for this or any previous visit (from the past 240 hour(s)). Studies/Results: Ct Head Wo Contrast  08/12/2013   CLINICAL DATA:  Numbness, possible TIA, right arm tingling  EXAM: CT HEAD WITHOUT CONTRAST  TECHNIQUE: Contiguous axial images were obtained from the base of the skull through the vertex without intravenous contrast.  COMPARISON:  None.  FINDINGS: No skull fracture is noted. Paranasal sinuses and mastoid air cells are unremarkable. No hydrocephalus. No intracranial hemorrhage, mass effect or midline shift. No acute infarction. No mass lesion is noted on this unenhanced scan. Gray and white-matter differentiation is preserved.  IMPRESSION: No acute intracranial abnormality.  No definite acute infarction.   Electronically Signed   By: Lahoma Crocker M.D.   On: 08/12/2013 11:41   Mr [name] NU Contrast  08/13/2013   CLINICAL DATA:  The patient experienced to episodes of right hand flexing at the fingers and going numb for 5 minutes; these both resolved. Evaluate for demyelinating disease versus ischemia. Stroke risk factors include hyperlipidemia.  EXAM: MRI HEAD WITHOUT AND WITH CONTRAST  MRA HEAD WITHOUT CONTRAST  TECHNIQUE: Multiplanar, multiecho pulse  sequences of the brain and surrounding structures were obtained without and with intravenous contrast. Angiographic images of the head were obtained using MRA technique without contrast.  CONTRAST:  67mL MULTIHANCE GADOBENATE DIMEGLUMINE 529 MG/ML IV SOLN  COMPARISON:  CT head 08/12/2013  FINDINGS: MRI HEAD FINDINGS  No evidence for acute infarction, hemorrhage, mass lesion, hydrocephalus, or extra-axial fluid. Normal for age cerebral volume. No areas of chronic or lacunar infarction are evident.  Sagittal and axial FLAIR images were obtained. There are fairly numerous bilateral subcortical supratentorial white matter foci of T2 and FLAIR hyperintensity without periventricular or callosal  lesions. No brainstem or cerebellar involvement. This pattern is most consistent with chronic microvascular ischemic change of a mild degree. Complicated migraine, vasculitis, chronic infection, or atypical demyelinating process are alternate considerations. Within limits of visualization on sagittal FLAIR imaging of the upper cervical region, no cervical cord abnormalities.  Unremarkable pituitary and cerebellar tonsils. Flow voids are maintained in the bilateral internal carotid, basilar, and bilateral vertebral arteries, with the left vertebral dominant.  Post infusion, no abnormal enhancement of the brain or meninges. No osseous lesions. The sinuses, mastoids, and orbits appear unremarkable. Small nasopharyngeal inclusion cysts do not result in eustachian tube dysfunction.  MRA HEAD FINDINGS  The internal carotid arteries are widely patent. The basilar artery is widely patent with left vertebral dominant. There is no intracranial stenosis or aneurysm. Moderate dolichoectasia could suggest chronic hypertension.  IMPRESSION: No evidence for acute infarction.  Supratentorial subcortical white matter foci of signal abnormality most consistent with chronic microvascular ischemic change. Demyelinating process is not favored. No abnormality on diffusion or postcontrast imaging to suggest acute process.  Unremarkable MRA of the intracranial circulation except for dolichoectasia.   Electronically Signed   By: Rolla Flatten M.D.   On: 08/13/2013 08:13   Mr [name] Head/brain Wo Cm  08/13/2013   CLINICAL DATA:  The patient experienced to episodes of right hand flexing at the fingers and going numb for 5 minutes; these both resolved. Evaluate for demyelinating disease versus ischemia. Stroke risk factors include hyperlipidemia.  EXAM: MRI HEAD WITHOUT AND WITH CONTRAST  MRA HEAD WITHOUT CONTRAST  TECHNIQUE: Multiplanar, multiecho pulse sequences of the brain and surrounding structures were obtained without and with  intravenous contrast. Angiographic images of the head were obtained using MRA technique without contrast.  CONTRAST:  29mL MULTIHANCE GADOBENATE DIMEGLUMINE 529 MG/ML IV SOLN  COMPARISON:  CT head 08/12/2013  FINDINGS: MRI HEAD FINDINGS  No evidence for acute infarction, hemorrhage, mass lesion, hydrocephalus, or extra-axial fluid. Normal for age cerebral volume. No areas of chronic or lacunar infarction are evident.  Sagittal and axial FLAIR images were obtained. There are fairly numerous bilateral subcortical supratentorial white matter foci of T2 and FLAIR hyperintensity without periventricular or callosal lesions. No brainstem or cerebellar involvement. This pattern is most consistent with chronic microvascular ischemic change of a mild degree. Complicated migraine, vasculitis, chronic infection, or atypical demyelinating process are alternate considerations. Within limits of visualization on sagittal FLAIR imaging of the upper cervical region, no cervical cord abnormalities.  Unremarkable pituitary and cerebellar tonsils. Flow voids are maintained in the bilateral internal carotid, basilar, and bilateral vertebral arteries, with the left vertebral dominant.  Post infusion, no abnormal enhancement of the brain or meninges. No osseous lesions. The sinuses, mastoids, and orbits appear unremarkable. Small nasopharyngeal inclusion cysts do not result in eustachian tube dysfunction.  MRA HEAD FINDINGS  The internal carotid arteries are widely patent. The basilar artery is widely patent  with left vertebral dominant. There is no intracranial stenosis or aneurysm. Moderate dolichoectasia could suggest chronic hypertension.  IMPRESSION: No evidence for acute infarction.  Supratentorial subcortical white matter foci of signal abnormality most consistent with chronic microvascular ischemic change. Demyelinating process is not favored. No abnormality on diffusion or postcontrast imaging to suggest acute process.   Unremarkable MRA of the intracranial circulation except for dolichoectasia.   Electronically Signed   By: Rolla Flatten M.D.   On: 08/13/2013 08:13   Medications: I have reviewed the patient's current medications. Scheduled Meds: . aspirin EC  81 mg Oral Daily  . enoxaparin (LOVENOX) injection  40 mg Subcutaneous Q24H  . nicotine  7 mg Transdermal Daily  . pneumococcal 23 valent vaccine  0.5 mL Intramuscular Tomorrow-1000  . simvastatin  40 mg Oral q1800  . sodium chloride  3 mL Intravenous Q12H  . sodium chloride  3 mL Intravenous Q12H   Continuous Infusions:  PRN Meds:.sodium chloride, acetaminophen, acetaminophen, sodium chloride Assessment/Plan: Weakness of limb- Transient/ Resolved  - Patient with unusual presentation for TIA/ CVA. - Neurology consulted and ordered workup for TIA/ CVA also rule out MS. -  MRI negative for acute CVA, no demyelinating process noted, does suggest possible chronic hypertensive changes although no known history of HTN. - Continue ASA 81mg  - Pending TTE and Carotid U/S - PT/ OT /SLP consulted but no needs identified. And passed RN swallow screen. - TSH wnl, A1c 5.7%, LDL 169  Tobacco Abuse  - Discussed importance of cessation  - Nicotine 7mg  patch   HLD - Simvastatin 40mg  (non complaint with medication in the past) - WIll need repeat lipid panel in a few weeks.  DVT: Heparin Tira  Diet: Regular  Code: Full  Dispo: Possible discharge later today if TIA/CVA completed  The patient does have a current PCP (Juluis Mire, MD) and does need an [facility] hospital follow-up appointment after discharge.  The patient does not have transportation limitations that hinder transportation to clinic appointments.  .Services Needed at time of discharge: Y = Yes, Blank = No PT:   OT:   RN:   Equipment:   Other:     LOS: 1 day   Joni Reining, DO 08/13/2013, 10:05 AM

## 2013-08-13 NOTE — Progress Notes (Signed)
Discussed discharge instructions with pt including how and when to call the dr, follow up appts, medications to take at home, prescriptions, and how to use MyChart. Pt verbalized understanding and denied any questions.   Eulis Canner, RN

## 2013-08-13 NOTE — Progress Notes (Signed)
Subjective: Patient had no new complaints. She is noted to recur and so tingling and weakness.  Objective: Current vital signs: BP 101/49  Pulse 57  Temp(Src) 98.3 F (36.8 C) (Oral)  Resp 16  Ht 5\' 6"  (1.676 m)  Wt 94.3 kg (207 lb 14.3 oz)  BMI 33.57 kg/m2  SpO2 100%  Neurologic Exam: Alert and in no acute distress. Mental status was normal.  Hemoglobin A1c was 5.7. Total cholesterol was 245 and LDL was 169 - currently on statin.  Medications: I have reviewed the patient's current medications.  Assessment/Plan: 53 year old lady presenting with probable transient ischemic attack. MRI showed no acute stroke an MRA was unremarkable. Patient is currently on aspirin daily. Carotid Doppler study and echocardiogram are pending. No further neurological intervention is indicated if these studies unremarkable.  C.R. Nicole Kindred, MD [facility] [phone]  08/13/2013  1:27 PM

## 2013-08-13 NOTE — Progress Notes (Signed)
OT Discharge Note  Patient Details Name: Betty Archer MRN: [id number] DOB: 04-26-1960   Cancelled Treatment:    Reason Eval/Treat Not Completed: OT screened, no needs identified, will sign off. OT spoke with patient and not acute needs at this time. OT contacted PT Santiago Glad to notify of baseline.  Peri Maris Pager: [phone]  08/13/2013, 9:18 AM

## 2013-08-13 NOTE — Progress Notes (Signed)
Utilization review completed.  

## 2013-08-13 NOTE — Discharge Instructions (Signed)
Please take a daily baby Aspirin (81mg ) Please take Simvastatin 40mg  once a day at night  Keep your follow up appointment with Dr. Naaman Plummer on Monday at 8:45am.

## 2013-08-13 NOTE — Progress Notes (Signed)
VASCULAR LAB PRELIMINARY  PRELIMINARY  PRELIMINARY  PRELIMINARY  Carotid duplex  completed.    Preliminary report:  Bilateral:  1-39% ICA stenosis.  Vertebral artery flow is antegrade.      Alean Kromer, RVT 08/13/2013, 2:06 PM

## 2013-08-13 NOTE — H&P (Signed)
Seen the patient, agree with documentation. Please see my HP.

## 2013-08-13 NOTE — Progress Notes (Addendum)
Echo Lab  2D Echocardiogram completed.  Ordered as Echocardiogram with bubbly study.  Bubbly study cancelled per Dr. Terrall Laity, [place] 08/13/2013 2:15 PM

## 2013-08-13 NOTE — Progress Notes (Signed)
SLP Cancellation Note  Patient Details Name: Betty Archer MRN: [id number] DOB: November 05, 1960   Cancelled treatment:       Reason Eval/Treat Not Completed: SLP screened, no needs identified, will sign off. SLP received orders for a Bedside swallow eval, but pt passed the RN stroke swallow and has been on a diet, Discussed with MD who agreed to defer SLP eval of swallowing at this time. Please reorder if needs arise. Thanks.    DeBlois, Katherene Ponto 08/13/2013, 9:05 AM

## 2013-08-13 NOTE — Progress Notes (Signed)
PT Cancellation Note  Patient Details Name: Betty Archer MRN: [id number] DOB: 1960/07/01   Cancelled Treatment:    Reason Eval/Treat Not Completed: PT screened, no needs identified, will sign off.   SMITH,KAREN LUBECK 08/13/2013, 12:19 PM

## 2013-08-13 NOTE — Discharge Summary (Signed)
Name: Betty Archer MRN: [id number] DOB: 12-24-1960 53 y.o. PCP: Juluis Mire, MD  Date of Admission: 08/12/2013  1:03 PM Date of Discharge: 08/13/2013 Attending Physician: Madilyn Fireman, MD  Discharge Diagnosis: Principal Problem:   TIA (transient ischemic attack) Active Problems:   Hyperlipidemia   TOBACCO DEPENDENCE  Discharge Medications:   Medication List         aspirin 81 MG EC tablet  Take 1 tablet (81 mg total) by mouth daily.     nicotine 7 mg/24hr patch  Commonly known as:  NICODERM CQ - dosed in mg/24 hr  Place 1 patch (7 mg total) onto the skin daily.     simvastatin 40 MG tablet  Commonly known as:  ZOCOR  Take 1 tablet (40 mg total) by mouth daily at 6 PM.        Disposition and follow-up:   [name] was discharged from [facility] in Good condition.  At the hospital follow up visit please address:  1.  Further symptoms of hand weakness or ?spasm, consider further testing for carpal tunnel syndrome  2. Compliance with medications  3. Progress with smoking cessation  4.  Labs / imaging needed at time of follow-up: Lipid panel in 4-6 weeks  5.  Pending labs/ test needing follow-up: none  Follow-up Appointments: Follow-up Information   Follow up with Juluis Mire, MD On 08/18/2013. (at 8:45am for hosptial follow up)    Specialty:  Internal Medicine   Contact information:   [place] [address] [phone]       Discharge Instructions: Discharge Instructions   Diet - low sodium heart healthy    Complete by:  As directed      Increase activity slowly    Complete by:  As directed            Consultations: Treatment Team:  Catarina Hartshorn, MD  Procedures Performed:  Ct Head Wo Contrast  08/12/2013   CLINICAL DATA:  Numbness, possible TIA, right arm tingling  EXAM: CT HEAD WITHOUT CONTRAST  TECHNIQUE: Contiguous axial images were obtained from the base of the skull through the vertex  without intravenous contrast.  COMPARISON:  None.  FINDINGS: No skull fracture is noted. Paranasal sinuses and mastoid air cells are unremarkable. No hydrocephalus. No intracranial hemorrhage, mass effect or midline shift. No acute infarction. No mass lesion is noted on this unenhanced scan. Gray and white-matter differentiation is preserved.  IMPRESSION: No acute intracranial abnormality.  No definite acute infarction.   Electronically Signed   By: Lahoma Crocker M.D.   On: 08/12/2013 11:41   Mr [name] BS Contrast  08/13/2013   CLINICAL DATA:  The patient experienced to episodes of right hand flexing at the fingers and going numb for 5 minutes; these both resolved. Evaluate for demyelinating disease versus ischemia. Stroke risk factors include hyperlipidemia.  EXAM: MRI HEAD WITHOUT AND WITH CONTRAST  MRA HEAD WITHOUT CONTRAST  TECHNIQUE: Multiplanar, multiecho pulse sequences of the brain and surrounding structures were obtained without and with intravenous contrast. Angiographic images of the head were obtained using MRA technique without contrast.  CONTRAST:  43mL MULTIHANCE GADOBENATE DIMEGLUMINE 529 MG/ML IV SOLN  COMPARISON:  CT head 08/12/2013  FINDINGS: MRI HEAD FINDINGS  No evidence for acute infarction, hemorrhage, mass lesion, hydrocephalus, or extra-axial fluid. Normal for age cerebral volume. No areas of chronic or lacunar infarction are evident.  Sagittal and axial FLAIR images were obtained. There are fairly  numerous bilateral subcortical supratentorial white matter foci of T2 and FLAIR hyperintensity without periventricular or callosal lesions. No brainstem or cerebellar involvement. This pattern is most consistent with chronic microvascular ischemic change of a mild degree. Complicated migraine, vasculitis, chronic infection, or atypical demyelinating process are alternate considerations. Within limits of visualization on sagittal FLAIR imaging of the upper cervical region, no cervical cord  abnormalities.  Unremarkable pituitary and cerebellar tonsils. Flow voids are maintained in the bilateral internal carotid, basilar, and bilateral vertebral arteries, with the left vertebral dominant.  Post infusion, no abnormal enhancement of the brain or meninges. No osseous lesions. The sinuses, mastoids, and orbits appear unremarkable. Small nasopharyngeal inclusion cysts do not result in eustachian tube dysfunction.  MRA HEAD FINDINGS  The internal carotid arteries are widely patent. The basilar artery is widely patent with left vertebral dominant. There is no intracranial stenosis or aneurysm. Moderate dolichoectasia could suggest chronic hypertension.  IMPRESSION: No evidence for acute infarction.  Supratentorial subcortical white matter foci of signal abnormality most consistent with chronic microvascular ischemic change. Demyelinating process is not favored. No abnormality on diffusion or postcontrast imaging to suggest acute process.  Unremarkable MRA of the intracranial circulation except for dolichoectasia.   Electronically Signed   By: Rolla Flatten M.D.   On: 08/13/2013 08:13   Mr [name] Head/brain Wo Cm  08/13/2013   CLINICAL DATA:  The patient experienced to episodes of right hand flexing at the fingers and going numb for 5 minutes; these both resolved. Evaluate for demyelinating disease versus ischemia. Stroke risk factors include hyperlipidemia.  EXAM: MRI HEAD WITHOUT AND WITH CONTRAST  MRA HEAD WITHOUT CONTRAST  TECHNIQUE: Multiplanar, multiecho pulse sequences of the brain and surrounding structures were obtained without and with intravenous contrast. Angiographic images of the head were obtained using MRA technique without contrast.  CONTRAST:  58mL MULTIHANCE GADOBENATE DIMEGLUMINE 529 MG/ML IV SOLN  COMPARISON:  CT head 08/12/2013  FINDINGS: MRI HEAD FINDINGS  No evidence for acute infarction, hemorrhage, mass lesion, hydrocephalus, or extra-axial fluid. Normal for age cerebral volume. No  areas of chronic or lacunar infarction are evident.  Sagittal and axial FLAIR images were obtained. There are fairly numerous bilateral subcortical supratentorial white matter foci of T2 and FLAIR hyperintensity without periventricular or callosal lesions. No brainstem or cerebellar involvement. This pattern is most consistent with chronic microvascular ischemic change of a mild degree. Complicated migraine, vasculitis, chronic infection, or atypical demyelinating process are alternate considerations. Within limits of visualization on sagittal FLAIR imaging of the upper cervical region, no cervical cord abnormalities.  Unremarkable pituitary and cerebellar tonsils. Flow voids are maintained in the bilateral internal carotid, basilar, and bilateral vertebral arteries, with the left vertebral dominant.  Post infusion, no abnormal enhancement of the brain or meninges. No osseous lesions. The sinuses, mastoids, and orbits appear unremarkable. Small nasopharyngeal inclusion cysts do not result in eustachian tube dysfunction.  MRA HEAD FINDINGS  The internal carotid arteries are widely patent. The basilar artery is widely patent with left vertebral dominant. There is no intracranial stenosis or aneurysm. Moderate dolichoectasia could suggest chronic hypertension.  IMPRESSION: No evidence for acute infarction.  Supratentorial subcortical white matter foci of signal abnormality most consistent with chronic microvascular ischemic change. Demyelinating process is not favored. No abnormality on diffusion or postcontrast imaging to suggest acute process.  Unremarkable MRA of the intracranial circulation except for dolichoectasia.   Electronically Signed   By: Rolla Flatten M.D.   On: 08/13/2013 08:13   Carotid Doppler  08/13/13: - The vertebral arteries appear patent with antegrade flow. - Findings consistent with 1-39 percent stenosis involving the right internal carotid artery and the left internal carotid artery.  2D  Echo: 08/13/13 Study Conclusions  - Left ventricle: The cavity size was normal. Wall thickness was increased in a pattern of mild LVH. Systolic function was normal. The estimated ejection fraction was in the range of 55% to 60%. Wall motion was normal; there were no regional wall motion abnormalities. Left ventricular diastolic function parameters were normal.  Impressions:  - Normal study.    Admission HPI: Betty Archer is a 53 yo AA female with PMH of HLD, (has history of Afib on chart but takes no medications, is not otherwise aware of A fib from extensive review of EPIC this was added on 10/10/11 by Mariea Clonts, RN, when patient was seen in the ED for shingles and had an EKG that was NSR), Obesity, Tobacco Abuse.  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 She reports she was at work last night doing this job when her right hand "drawed up" by this she means that her hand seemed to take a claw like shape and she could not move it for about 5 minutes. She reports that she shook her hand and hit it a number of times with the other hand and her normal function returned. She reports that she went back to work and about 20 minutes later the same thing happened. She notes that this has happened on at least one occasion a few months ago. She reports some associated numbness and tingling but cannot give a good description of a specific dermatone, it seems to have happened more so on her dorsal aspect of her arm. She denies any changes in speech, dizziness, headache, changes in vision, or other neurologic abnormalities. She denies any chest pain, fever, chills.  She admits 0.5 ppd smoking history for >20 years, occasional ETOH use, denies any illicit drug use.   Hospital Course by problem list:   TIA (transient ischemic attack) -  Patient presented after 2 episodes of weakness of her right hand. She was admitted with a concern for TIA/CVA.  Neurology was consulted and expanded differential to include MS.  An MRI of brain with contrast was preformed and revealed no acute CVA or demyelinating process.  She was risk stratified with A1c (5.7%), TSH (wnl), Lipid panel (LDL 169, not on medication), Carotid dopplers (1-39% stenosis), and TTE (normal study).  She was screened by PT and OT who identified no needs.  She passed a swallow evaluation.  She was discharged on ASA 81mg  for antiplatelet therapy, Simvastatin 40mg  QHS, and Nicotine patches.  She will follow up in the [facility] on 6/29 with her PCP.    Hyperlipidemia  -LDL 169, started on Simvastatin 40mg , please repeat lipid panel in a few weeks to reassess.    TOBACCO DEPENDENCE - Patient was treated with Nicotine patch while inpatient, she was strongly discouraged from continuing smoking given possible TIA.  She was prescribed NRT on discharge.  Please reassess how she is doing with this.  Discharge Vitals:   BP 111/66  Pulse 61  Temp(Src) 98 F (36.7 C) (Oral)  Resp 16  Ht 5\' 6"  (1.676 m)  Wt 207 lb 14.3 oz (94.3 kg)  BMI 33.57 kg/m2  SpO2 100%  Discharge Labs:  Results for orders placed during the hospital encounter of 08/12/13 (from the past 24 hour(s))  BASIC METABOLIC PANEL     Status: None   Collection Time    08/13/13  3:50 AM      Result Value Ref Range   Sodium 142  137 - 147 mEq/L   Potassium 4.2  3.7 - 5.3 mEq/L   Chloride 105  96 - 112 mEq/L   CO2 24  19 - 32 mEq/L   Glucose, Bld 95  70 - 99 mg/dL   BUN 13  6 - 23 mg/dL   Creatinine, Ser 0.71  0.50 - 1.10 mg/dL   Calcium 9.3  8.4 - 10.5 mg/dL   GFR calc non Af Amer >90  >90 mL/min   GFR calc Af Amer >90  >90 mL/min    Signed: Joni Reining, DO 08/13/2013, 5:13 PM    Services Ordered on Discharge: none Equipment Ordered on Discharge: none

## 2013-08-14 NOTE — Discharge Summary (Signed)
Reviewed. Agree with documentation. 

## 2013-08-18 ENCOUNTER — Encounter: Payer: Self-pay | Admitting: Internal Medicine

## 2013-08-18 ENCOUNTER — Ambulatory Visit (INDEPENDENT_AMBULATORY_CARE_PROVIDER_SITE_OTHER): Payer: BC Managed Care – PPO | Admitting: Internal Medicine

## 2013-08-18 VITALS — BP 107/73 | HR 64 | Temp 98.6°F | Ht 66.0 in | Wt 208.1 lb

## 2013-08-18 DIAGNOSIS — F17200 Nicotine dependence, unspecified, uncomplicated: Secondary | ICD-10-CM

## 2013-08-18 DIAGNOSIS — E785 Hyperlipidemia, unspecified: Secondary | ICD-10-CM

## 2013-08-18 DIAGNOSIS — G459 Transient cerebral ischemic attack, unspecified: Secondary | ICD-10-CM

## 2013-08-18 DIAGNOSIS — R7303 Prediabetes: Secondary | ICD-10-CM | POA: Insufficient documentation

## 2013-08-18 DIAGNOSIS — G5690 Unspecified mononeuropathy of unspecified upper limb: Secondary | ICD-10-CM

## 2013-08-18 DIAGNOSIS — Z0000 Encounter for general adult medical examination without abnormal findings: Secondary | ICD-10-CM

## 2013-08-18 DIAGNOSIS — R209 Unspecified disturbances of skin sensation: Secondary | ICD-10-CM

## 2013-08-18 DIAGNOSIS — R7309 Other abnormal glucose: Secondary | ICD-10-CM

## 2013-08-18 NOTE — Assessment & Plan Note (Signed)
Assessment: Pt with A1c of 5.7 on 08/12/13 with no symptoms of diabetes.   Plan:  -Encourage lifestyle modification

## 2013-08-18 NOTE — Assessment & Plan Note (Addendum)
Assessment: Pt with history of tobacco use since age 53, currently smoking 0.5 pack cigarettes daily with no plans to quit.   Plan:  -Continue nicotine 7 mg patch  -Encourage tobacco cessation

## 2013-08-18 NOTE — Progress Notes (Signed)
Patient ID: Betty Archer, female   DOB: Nov 23, 1960, 53 y.o.   MRN: [id number]    Subjective:   Patient ID: Betty Archer female   DOB: 1960/09/01 53 y.o.   MRN: [id number]  HPI: [name] is a 53 y.o. woman with past medical history of obesity, hyperlipidemia, and tobacco abuse who presents for hospital follow-up.   Pt was hospitalized from 6/23-6/24 for acute onset of right arm/hand paraesthesia and paresis with resolution of symptoms concerning for TIA. Work-up was unrevealing. She was placed on aspirin 81 mg daily and simvastatin 40 mg daily which she reports compliance with. She reports that since discharge she has had no further episodes of numbness/tingling in her right hand/arm or weakness. She denies headache, dysphagia, blurry vision, dysarthria, dizziness, or imbalance. She has mild right shoulder pain but it is not currently bothersome.   She was placed on 7 mg nicotine patch during recent hospitalization. She reports she is not ready to quit smoking.     Past Medical History  Diagnosis Date  . Hyperlipidemia    Current Outpatient Prescriptions  Medication Sig Dispense Refill  . aspirin EC 81 MG EC tablet Take 1 tablet (81 mg total) by mouth daily.  30 tablet  11  . nicotine (NICODERM CQ - DOSED IN MG/24 HR) 7 mg/24hr patch Place 1 patch (7 mg total) onto the skin daily.  28 patch  0  . simvastatin (ZOCOR) 40 MG tablet Take 1 tablet (40 mg total) by mouth daily at 6 PM.  30 tablet  11   No current facility-administered medications for this visit.   Family History  Problem Relation Age of Onset  . Breast cancer Maternal Grandmother   . Breast cancer Maternal Aunt   . Hypertension Mother   . Lung cancer Father   . Colon cancer Neg Hx   . Esophageal cancer Neg Hx   . Stomach cancer Neg Hx    History   Social History  . Marital Status: Single    Spouse Name: N/A    Number of Children: N/A  . Years of Education: N/A   Occupational History  .  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 Smoking status: Current Every Day Smoker -- 0.50 packs/day for 27 years    Types: Cigarettes  . Smokeless tobacco: Never Used  . Alcohol Use: 0.0 oz/week     Comment:  patient is a social drinker  . Drug Use: No  . Sexual Activity: Not on file   Other Topics Concern  . 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   Review of Systems: Review of Systems  Constitutional: Negative for fever and chills.  HENT: Negative for congestion and sore throat.   Eyes: Negative for blurred vision.  Respiratory: Negative for cough, shortness of breath and wheezing.   Cardiovascular: Negative for chest pain, palpitations and leg swelling.  Gastrointestinal: Negative for nausea, vomiting, abdominal pain, diarrhea and constipation.  Genitourinary: Negative for dysuria, urgency and frequency.  Musculoskeletal: Positive for joint pain (chronic right shoulder pain). Negative for falls, myalgias and neck pain.  Neurological: Negative for dizziness, tingling, sensory change, speech change, focal weakness and headaches.  Endo/Heme/Allergies: Does not bruise/bleed easily.    Objective:  Physical Exam: Filed Vitals:   08/18/13 6294  BP: 107/73  Pulse: 64  Temp: 98.6 F (37 C)  TempSrc: Oral  Height: 5\' 6"  (1.676 m)  Weight: 208 lb 1.6 oz (94.394 kg)  SpO2: 100%    Physical Exam  Constitutional: She is oriented to person, place, and time. She appears well-developed and well-nourished. No distress.  HENT:  Head: Normocephalic and atraumatic.  Right Ear: External ear normal.  Left Ear: External ear normal.  Nose: Nose normal.  Mouth/Throat: Oropharynx is clear and moist. No oropharyngeal exudate.  Eyes: Conjunctivae are normal. Pupils are equal, round, and reactive to light.  Neck: Normal range of motion. Neck supple.  Cardiovascular:  Normal rate, regular rhythm and normal heart sounds.   Pulmonary/Chest: Effort normal and breath sounds normal. No respiratory distress. She has no wheezes. She has no rales.  Abdominal: Soft. Bowel sounds are normal. She exhibits no distension. There is no tenderness. There is no rebound and no guarding.  Musculoskeletal: Normal range of motion. She exhibits no edema and no tenderness.  No tenderness to palpation of right glenohumeral joint  Neurological: She is alert and oriented to person, place, and time. No cranial nerve deficit.  Normal 5/5 muscle strength Normal sensation to light touch  Skin: Skin is warm and dry. No rash noted. She is not diaphoretic. No erythema. No pallor.  Psychiatric: She has a normal mood and affect. Her behavior is normal. Judgment and thought content normal.    Assessment & Plan:   Please see problem list for problem-based assessment and plan

## 2013-08-18 NOTE — Assessment & Plan Note (Signed)
Assessment: Pt with last lipid panel on 10/17/11 with hypercholesteremia recently restarted on moderate-intensity statin therapy with 10-yr risk of 8.4% and lifetime risk of 39%.  Plan:  -Encourage lifestyle modification  -Continue simvastatin 40 mg daily  -Last CMP normal on 08/12/13 -Monitor for myalgias

## 2013-08-18 NOTE — Assessment & Plan Note (Signed)
-  Pt referred for screening mammography

## 2013-08-18 NOTE — Patient Instructions (Addendum)
-  Keep taking aspirin 81 mg daily and simvastatin 40 mg daily -Keep using nicotine patches and try to quit smoking -Will refer you for mammography  -If you have any problems with your right shoulder or arm please call us, we can refer you to sports medicine -Will see you back in 6 months or sooner if you have any problems,  nice to see you again!    General Instructions:   Please try to bring all your medicines next time. This will help [place] keep you safe from mistakes.   Progress Toward Treatment Goals:  No flowsheet data found.  Self Care Goals & Plans:  Self Care Goal 08/12/2013  Manage my medications take my medicines as prescribed  Stop smoking call [facility] (1-800-QUIT-NOW); go to the [employer] (https://scott-booker.info/)    No flowsheet data found.   Care Management & Community Referrals:  No flowsheet data found.

## 2013-08-18 NOTE — Assessment & Plan Note (Addendum)
Assessment: Pt with acute onset of right hand paresis and paraesthesias with recent hospitalization for TIA with negative work-up. Etiology unclear, possibly TIA vs cervical/right shoulder pathology from previous injury.   Plan: -Continue aspirin 81 mg daily  -Continue simvastatin 40 mg daily  -Encourage smoking cessation and lifestyle modification  -Consider sports medicine referral if right shoulder/arm paraesthesia returns

## 2013-08-19 NOTE — Progress Notes (Signed)
INTERNAL MEDICINE TEACHING ATTENDING ADDENDUM - Nischal Narendra, MD: I reviewed and discussed with the resident Dr. Rabbani, the patient's medical history, physical examination, diagnosis and results of pertinent tests and treatment and I agree with the patient's care as documented. 

## 2013-10-20 ENCOUNTER — Emergency Department (INDEPENDENT_AMBULATORY_CARE_PROVIDER_SITE_OTHER)
Admission: EM | Admit: 2013-10-20 | Discharge: 2013-10-20 | Disposition: A | Discharge status: Home / Self Care | Payer: BC Managed Care – PPO | Source: Home / Self Care | Attending: Family Medicine | Admitting: Family Medicine

## 2013-10-20 ENCOUNTER — Encounter (HOSPITAL_COMMUNITY): Payer: Self-pay | Admitting: Emergency Medicine

## 2013-10-20 DIAGNOSIS — H0019 Chalazion unspecified eye, unspecified eyelid: Secondary | ICD-10-CM

## 2013-10-20 DIAGNOSIS — H0014 Chalazion left upper eyelid: Secondary | ICD-10-CM

## 2013-10-20 MED ORDER — MOXIFLOXACIN HCL 0.5 % OP SOLN: 1.0000 [drp] | Freq: Three times a day (TID) | OPHTHALMIC | Status: DC

## 2013-10-20 NOTE — ED Provider Notes (Signed)
CSN: [id number]     Arrival date & time 10/20/13  1742 History   First MD Initiated Contact with Patient 10/20/13 1749     Chief Complaint  Patient presents with  . Eye Problem   (Consider location/radiation/quality/duration/timing/severity/associated sxs/prior Treatment) Patient is a 53 y.o. female presenting with eye problem. The history is provided by the patient.  Eye Problem Location:  L eye Quality:  Sharp Severity:  Moderate Onset quality:  Gradual Duration:  3 weeks Progression:  Worsening Chronicity:  New Context comment:  Swelling was worse last week but continues swollen, Relieved by:  None tried Worsened by:  Nothing tried Ineffective treatments:  None tried Associated symptoms: no blurred vision, no discharge, no double vision, no foreign body sensation, no itching and no redness     Past Medical History  Diagnosis Date  . Hyperlipidemia    Past Surgical History  Procedure Laterality Date  . Breast biopsy       Core biopsy done on April 2003  . Partial hysterectomy   10/22/1999     Still has cervix   Family History  Problem Relation Age of Onset  . Breast cancer Maternal Grandmother   . Breast cancer Maternal Aunt   . Hypertension Mother   . Lung cancer Father   . Colon cancer Neg Hx   . Esophageal cancer Neg Hx   . Stomach cancer Neg Hx    History  Substance Use Topics  . Smoking status: Current Every Day Smoker -- 0.50 packs/day for 27 years    Types: Cigarettes  . Smokeless tobacco: Never Used  . Alcohol Use: 0.0 oz/week     Comment:  patient is a social drinker   OB History   Grav Para Term Preterm Abortions TAB SAB Ect Mult Living                 Review of Systems  Constitutional: Negative.   Eyes: Negative for blurred vision, double vision, discharge, redness and itching.    Allergies  Review of patient's allergies indicates no known allergies.  Home Medications   Prior to Admission medications   Medication Sig Start Date End Date  Taking? Authorizing Provider  aspirin EC 81 MG EC tablet Take 1 tablet (81 mg total) by mouth daily. 08/13/13   Lucious Groves, DO  moxifloxacin (VIGAMOX) 0.5 % ophthalmic solution Place 1 drop into the left eye 3 (three) times daily. 10/20/13   Billy Fischer, MD  nicotine (NICODERM CQ - DOSED IN MG/24 HR) 7 mg/24hr patch Place 1 patch (7 mg total) onto the skin daily. 08/13/13   Lucious Groves, DO  simvastatin (ZOCOR) 40 MG tablet Take 1 tablet (40 mg total) by mouth daily at 6 PM. 08/13/13   Lucious Groves, DO   BP 140/84  Pulse 72  Temp(Src) 98.6 F (37 C) (Oral)  Resp 16  SpO2 98% Physical Exam  Nursing note and vitals reviewed. Constitutional: She appears well-developed and well-nourished. No distress.  Eyes: Conjunctivae and EOM are normal. Pupils are equal, round, and reactive to light. Lids are everted and swept, no foreign bodies found. Right eye exhibits no hordeolum. Left eye exhibits hordeolum. Left conjunctiva is not injected. Left conjunctiva has no hemorrhage. No scleral icterus. Left eye exhibits normal extraocular motion.      ED Course  Procedures (including critical care time) Labs Review Labs Reviewed - No data to display  Imaging Review No results found.   MDM   1.  Chalazion of left upper eyelid        Billy Fischer, MD 10/20/13 [phone]

## 2013-10-20 NOTE — ED Notes (Signed)
Pt  Has  Swollen  Red  Tender  Stye    To  Her  l  Eyelid   Which  She  Has  Had    For      About  12  Days   The  Symptoms worse  After  Using an  Eye  Pencil         No  Ocular  Involvement

## 2013-10-20 NOTE — ED Notes (Signed)
Pt   Reports      Cannot  Afford  vigamox   [facility]  Called      Dr   Juventino Slovak  Notified  Change  To  tobrex    Opth  Drops  1  Drop l  Eye    Qid     X  5    Days

## 2013-10-20 NOTE — Discharge Instructions (Signed)
Warm cloth to eye before eye drops, see eye doctor for further treatment.

## 2013-11-17 ENCOUNTER — Emergency Department (INDEPENDENT_AMBULATORY_CARE_PROVIDER_SITE_OTHER): Payer: Worker's Compensation

## 2013-11-17 ENCOUNTER — Emergency Department (INDEPENDENT_AMBULATORY_CARE_PROVIDER_SITE_OTHER)
Admission: EM | Admit: 2013-11-17 | Discharge: 2013-11-17 | Disposition: A | Discharge status: Home / Self Care | Payer: Worker's Compensation | Source: Home / Self Care

## 2013-11-17 ENCOUNTER — Encounter (HOSPITAL_COMMUNITY): Payer: Self-pay | Admitting: Emergency Medicine

## 2013-11-17 DIAGNOSIS — S63602A Unspecified sprain of left thumb, initial encounter: Secondary | ICD-10-CM

## 2013-11-17 DIAGNOSIS — X58XXXA Exposure to other specified factors, initial encounter: Secondary | ICD-10-CM

## 2013-11-17 DIAGNOSIS — S6390XA Sprain of unspecified part of unspecified wrist and hand, initial encounter: Secondary | ICD-10-CM

## 2013-11-17 DIAGNOSIS — M6530 Trigger finger, unspecified finger: Secondary | ICD-10-CM

## 2013-11-17 DIAGNOSIS — M65312 Trigger thumb, left thumb: Secondary | ICD-10-CM

## 2013-11-17 NOTE — Discharge Instructions (Signed)
Finger Sprain A finger sprain is a tear in one of the strong, fibrous tissues that connect the bones (ligaments) in your finger. The severity of the sprain depends on how much of the ligament is torn. The tear can be either partial or complete. CAUSES  Often, sprains are a result of a fall or accident. If you extend your hands to catch an object or to protect yourself, the force of the impact causes the fibers of your ligament to stretch too much. This excess tension causes the fibers of your ligament to tear. SYMPTOMS  You may have some loss of motion in your finger. Other symptoms include:  Bruising.  Tenderness.  Swelling. DIAGNOSIS  In order to diagnose finger sprain, your caregiver will physically examine your finger or thumb to determine how torn the ligament is. Your caregiver may also suggest an X-ray exam of your finger to make sure no bones are broken. TREATMENT  If your ligament is only partially torn, treatment usually involves keeping the finger in a fixed position (immobilization) for a short period. To do this, your caregiver will apply a bandage, cast, or splint to keep your finger from moving until it heals. For a partially torn ligament, the healing process usually takes 2 to 3 weeks. If your ligament is completely torn, you may need surgery to reconnect the ligament to the bone. After surgery a cast or splint will be applied and will need to stay on your finger or thumb for 4 to 6 weeks while your ligament heals. HOME CARE INSTRUCTIONS  Keep your injured finger elevated, when possible, to decrease swelling.  To ease pain and swelling, apply ice to your joint twice a day, for 2 to 3 days:  Put ice in a plastic bag.  Place a towel between your skin and the bag.  Leave the ice on for 15 minutes.  Only take over-the-counter or prescription medicine for pain as directed by your caregiver.  Do not wear rings on your injured finger.  Do not leave your finger unprotected  until pain and stiffness go away (usually 3 to 4 weeks).  Do not allow your cast or splint to get wet. Cover your cast or splint with a plastic bag when you shower or bathe. Do not swim.  Your caregiver may suggest special exercises for you to do during your recovery to prevent or limit permanent stiffness. SEEK IMMEDIATE MEDICAL CARE IF:  Your cast or splint becomes damaged.  Your pain becomes worse rather than better. MAKE SURE YOU:  Understand these instructions.  Will watch your condition.  Will get help right away if you are not doing well or get worse. Document Released: 03/16/2004 Document Revised: 05/01/2011 Document Reviewed: 10/10/2010 [facility] Patient Information 2015 [place], [place]. This information is not intended to replace advice given to you by your health care provider. Make sure you discuss any questions you have with your health care provider.  Trigger Finger Trigger finger (digital tendinitis and stenosing tenosynovitis) is a common disorder that causes an often painful catching of the fingers or thumb. It occurs as a clicking, snapping, or locking of a finger in the palm of the hand. This is caused by a problem with the tendons that flex or bend the fingers sliding smoothly through their sheaths. The condition may occur in any finger or a couple fingers at the same time.  The finger may lock with the finger curled or suddenly straighten out with a snap. This is more common in  patients with rheumatoid arthritis and diabetes. Left untreated, the condition may get worse to the point where the finger becomes locked in flexion, like making a fist, or less commonly locked with the finger straightened out. CAUSES   Inflammation and scarring that lead to swelling around the tendon sheath.  Repeated or forceful movements.  Rheumatoid arthritis, an autoimmune disease that affects joints.  Gout.  Diabetes mellitus. SIGNS AND SYMPTOMS  Soreness and swelling of your  finger.  A painful clicking or snapping as you bend and straighten your finger. DIAGNOSIS  Your health care provider will do a physical exam of your finger to diagnose trigger finger. TREATMENT   Splinting for 6-8 weeks may be helpful.  Nonsteroidal anti-inflammatory medicines (NSAIDs) can help to relieve the pain and inflammation.  Cortisone injections, along with splinting, may speed up recovery. Several injections may be required. Cortisone may give relief after one injection.  Surgery is another treatment that may be used if conservative treatments do not work. Surgery can be minor, without incisions (a cut does not have to be made), and can be done with a needle through the skin.  Other surgical choices involve an open procedure in which the surgeon opens the hand through a small incision and cuts the pulley so the tendon can again slide smoothly. Your hand will still work fine. HOME CARE INSTRUCTIONS  Apply ice to the injured area, twice per day:  Put ice in a plastic bag.  Place a towel between your skin and the bag.  Leave the ice on for 20 minutes, 3-4 times a day.  Rest your hand often. MAKE SURE YOU:   Understand these instructions.  Will watch your condition.  Will get help right away if you are not doing well or get worse. Document Released: 11/27/2003 Document Revised: 10/09/2012 Document Reviewed: 07/09/2012 [facility] Patient Information 2015 [place], [place]. This information is not intended to replace advice given to you by your health care provider. Make sure you discuss any questions you have with your health care provider.

## 2013-11-17 NOTE — ED Provider Notes (Signed)
Medical screening examination/treatment/procedure(s) were performed by resident physician or non-physician practitioner and as supervising physician I was immediately available for consultation/collaboration.   Pauline Good MD.   Billy Fischer, MD 11/17/13 [phone]

## 2013-11-17 NOTE — ED Notes (Addendum)
Janne Napoleon, NP completed [facility] Authorization/Physicians Report [employer]-form provided by patient to be completed by provider
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
## 2013-11-17 NOTE — ED Notes (Signed)
Attempted to discharge, patient insisted on speaking with david mabe np and seeing xrays.  Delay in patient discharge due to patient's requests.  Janne Napoleon np notified.

## 2013-12-02 ENCOUNTER — Other Ambulatory Visit: Payer: Self-pay | Admitting: Orthopedic Surgery

## 2013-12-08 ENCOUNTER — Encounter (HOSPITAL_BASED_OUTPATIENT_CLINIC_OR_DEPARTMENT_OTHER): Payer: Self-pay | Admitting: *Deleted

## 2013-12-11 ENCOUNTER — Ambulatory Visit (HOSPITAL_BASED_OUTPATIENT_CLINIC_OR_DEPARTMENT_OTHER)
Admission: RE | Admit: 2013-12-11 | Payer: BC Managed Care – PPO | Source: Ambulatory Visit | Admitting: Orthopedic Surgery

## 2013-12-11 SURGERY — RELEASE, A1 PULLEY, FOR TRIGGER FINGER
Anesthesia: Choice | Site: Thumb | Laterality: Left

## 2013-12-16 ENCOUNTER — Other Ambulatory Visit: Payer: Self-pay | Admitting: Orthopedic Surgery

## 2013-12-25 ENCOUNTER — Encounter (HOSPITAL_BASED_OUTPATIENT_CLINIC_OR_DEPARTMENT_OTHER)
Admission: RE | Disposition: A | Discharge status: Home / Self Care | Payer: Self-pay | Source: Ambulatory Visit | Attending: Orthopedic Surgery

## 2013-12-25 ENCOUNTER — Encounter (HOSPITAL_BASED_OUTPATIENT_CLINIC_OR_DEPARTMENT_OTHER): Payer: Self-pay | Admitting: *Deleted

## 2013-12-25 ENCOUNTER — Ambulatory Visit (HOSPITAL_BASED_OUTPATIENT_CLINIC_OR_DEPARTMENT_OTHER): Payer: BC Managed Care – PPO | Admitting: Anesthesiology

## 2013-12-25 ENCOUNTER — Ambulatory Visit (HOSPITAL_BASED_OUTPATIENT_CLINIC_OR_DEPARTMENT_OTHER)
Admission: RE | Admit: 2013-12-25 | Discharge: 2013-12-25 | Disposition: A | Discharge status: Home / Self Care | Payer: BC Managed Care – PPO | Source: Ambulatory Visit | Attending: Orthopedic Surgery | Admitting: Orthopedic Surgery

## 2013-12-25 DIAGNOSIS — F17210 Nicotine dependence, cigarettes, uncomplicated: Secondary | ICD-10-CM | POA: Diagnosis not present

## 2013-12-25 DIAGNOSIS — Z6833 Body mass index (BMI) 33.0-33.9, adult: Secondary | ICD-10-CM | POA: Diagnosis not present

## 2013-12-25 DIAGNOSIS — E669 Obesity, unspecified: Secondary | ICD-10-CM | POA: Diagnosis not present

## 2013-12-25 DIAGNOSIS — M65312 Trigger thumb, left thumb: Secondary | ICD-10-CM | POA: Diagnosis present

## 2013-12-25 DIAGNOSIS — Z8673 Personal history of transient ischemic attack (TIA), and cerebral infarction without residual deficits: Secondary | ICD-10-CM | POA: Diagnosis not present

## 2013-12-25 DIAGNOSIS — E785 Hyperlipidemia, unspecified: Secondary | ICD-10-CM | POA: Diagnosis not present

## 2013-12-25 HISTORY — PX: TRIGGER FINGER RELEASE: SHX641

## 2013-12-25 SURGERY — RELEASE, A1 PULLEY, FOR TRIGGER FINGER
Anesthesia: Regional | Site: Finger | Laterality: Left

## 2013-12-25 MED ORDER — PROPOFOL INFUSION 10 MG/ML OPTIME
INTRAVENOUS | Status: DC | PRN
  Administered 2013-12-25: 75 ug/kg/min via INTRAVENOUS

## 2013-12-25 MED ORDER — PROPOFOL 10 MG/ML IV EMUL
INTRAVENOUS | Status: AC
  Filled 2013-12-25: qty 50

## 2013-12-25 MED ORDER — BUPIVACAINE HCL (PF) 0.25 % IJ SOLN
INTRAMUSCULAR | Status: DC | PRN
  Administered 2013-12-25: 7 mL

## 2013-12-25 MED ORDER — BUPIVACAINE HCL (PF) 0.25 % IJ SOLN
INTRAMUSCULAR | Status: AC
  Filled 2013-12-25: qty 30

## 2013-12-25 MED ORDER — LIDOCAINE HCL (CARDIAC) 10 MG/ML IV SOLN
INTRAVENOUS | Status: DC | PRN
  Administered 2013-12-25: 50 mg via INTRAVENOUS

## 2013-12-25 MED ORDER — CEFAZOLIN SODIUM 1-5 GM-% IV SOLN
INTRAVENOUS | Status: AC
  Filled 2013-12-25: qty 100

## 2013-12-25 MED ORDER — MIDAZOLAM HCL 2 MG/2ML IJ SOLN
INTRAMUSCULAR | Status: AC
  Filled 2013-12-25: qty 2

## 2013-12-25 MED ORDER — FENTANYL CITRATE 0.05 MG/ML IJ SOLN
50.0000 ug | INTRAMUSCULAR | Status: DC | PRN
  Administered 2013-12-25: 50 ug via INTRAVENOUS
  Administered 2013-12-25: 25 ug via INTRAVENOUS

## 2013-12-25 MED ORDER — LACTATED RINGERS IV SOLN
INTRAVENOUS | Status: DC
  Administered 2013-12-25: 10:00:00 via INTRAVENOUS

## 2013-12-25 MED ORDER — LIDOCAINE HCL (PF) 0.5 % IJ SOLN
INTRAMUSCULAR | Status: DC | PRN
  Administered 2013-12-25: 35 mL via INTRAVENOUS

## 2013-12-25 MED ORDER — FENTANYL CITRATE 0.05 MG/ML IJ SOLN
INTRAMUSCULAR | Status: AC
  Filled 2013-12-25: qty 4

## 2013-12-25 MED ORDER — CEFAZOLIN SODIUM-DEXTROSE 2-3 GM-% IV SOLR
2.0000 g | INTRAVENOUS | Status: AC
  Administered 2013-12-25: 2 g via INTRAVENOUS

## 2013-12-25 MED ORDER — CHLORHEXIDINE GLUCONATE 4 % EX LIQD: 60.0000 mL | Freq: Once | CUTANEOUS | Status: DC

## 2013-12-25 MED ORDER — MIDAZOLAM HCL 2 MG/2ML IJ SOLN
1.0000 mg | INTRAMUSCULAR | Status: DC | PRN
  Administered 2013-12-25 (×2): 1 mg via INTRAVENOUS

## 2013-12-25 MED ORDER — HYDROCODONE-ACETAMINOPHEN 5-325 MG PO TABS: ORAL_TABLET | ORAL | Status: DC

## 2013-12-25 SURGICAL SUPPLY — 36 items
BANDAGE COBAN STERILE 2 (GAUZE/BANDAGES/DRESSINGS) ×3 IMPLANT
BLADE MINI RND TIP GREEN BEAV (BLADE) IMPLANT
BLADE SURG 15 STRL LF DISP TIS (BLADE) ×2 IMPLANT
BLADE SURG 15 STRL SS (BLADE) ×6
BNDG CMPR 9X4 STRL LF SNTH (GAUZE/BANDAGES/DRESSINGS)
BNDG CONFORM 2 STRL LF (GAUZE/BANDAGES/DRESSINGS) ×3 IMPLANT
BNDG ESMARK 4X9 LF (GAUZE/BANDAGES/DRESSINGS) IMPLANT
CHLORAPREP W/TINT 26ML (MISCELLANEOUS) ×3 IMPLANT
CORDS BIPOLAR (ELECTRODE) ×3 IMPLANT
COVER BACK TABLE 60X90IN (DRAPES) ×3 IMPLANT
COVER MAYO STAND STRL (DRAPES) ×3 IMPLANT
CUFF TOURNIQUET SINGLE 18IN (TOURNIQUET CUFF) ×3 IMPLANT
DRAPE EXTREMITY T 121X128X90 (DRAPE) ×3 IMPLANT
DRAPE SURG 17X23 STRL (DRAPES) ×3 IMPLANT
GAUZE SPONGE 4X4 12PLY STRL (GAUZE/BANDAGES/DRESSINGS) ×3 IMPLANT
GAUZE XEROFORM 1X8 LF (GAUZE/BANDAGES/DRESSINGS) ×3 IMPLANT
GLOVE BIO SURGEON STRL SZ7.5 (GLOVE) ×3 IMPLANT
GLOVE BIOGEL PI IND STRL 6.5 (GLOVE) ×1 IMPLANT
GLOVE BIOGEL PI IND STRL 8 (GLOVE) ×1 IMPLANT
GLOVE BIOGEL PI INDICATOR 6.5 (GLOVE) ×2
GLOVE BIOGEL PI INDICATOR 8 (GLOVE) ×2
GLOVE ECLIPSE 6.5 STRL STRAW (GLOVE) ×3 IMPLANT
GOWN STRL REUS W/ TWL LRG LVL3 (GOWN DISPOSABLE) ×1 IMPLANT
GOWN STRL REUS W/TWL LRG LVL3 (GOWN DISPOSABLE) ×3
GOWN STRL REUS W/TWL XL LVL3 (GOWN DISPOSABLE) ×3 IMPLANT
NEEDLE HYPO 25X1 1.5 SAFETY (NEEDLE) IMPLANT
NS IRRIG 1000ML POUR BTL (IV SOLUTION) ×3 IMPLANT
PACK BASIN DAY SURGERY FS (CUSTOM PROCEDURE TRAY) ×3 IMPLANT
PADDING CAST ABS 4INX4YD NS (CAST SUPPLIES) ×2
PADDING CAST ABS COTTON 4X4 ST (CAST SUPPLIES) ×1 IMPLANT
STOCKINETTE 4X48 STRL (DRAPES) ×3 IMPLANT
SUT ETHILON 4 0 PS 2 18 (SUTURE) ×3 IMPLANT
SYR BULB 3OZ (MISCELLANEOUS) ×3 IMPLANT
SYR CONTROL 10ML LL (SYRINGE) IMPLANT
TOWEL OR 17X24 6PK STRL BLUE (TOWEL DISPOSABLE) ×6 IMPLANT
UNDERPAD 30X30 INCONTINENT (UNDERPADS AND DIAPERS) ×3 IMPLANT

## 2013-12-25 NOTE — Anesthesia Postprocedure Evaluation (Signed)
Anesthesia Post Note  Patient: Betty Archer  Procedure(s) Performed: Procedure(s) (LRB): LEFT THUMB TRIGGER RELEASE  (Left)  Anesthesia type: general  Patient location: PACU  Post pain: Pain level controlled  Post assessment: Patient's Cardiovascular Status Stable  Last Vitals:  Filed Vitals:   12/25/13 1300  BP: 149/73  Pulse: 57  Temp:   Resp: 16    Post vital signs: Reviewed and stable  Level of consciousness: sedated  Complications: No apparent anesthesia complications

## 2013-12-25 NOTE — Discharge Instructions (Addendum)

## 2013-12-25 NOTE — H&P (Signed)
  Betty Archer is an 53 y.o. female.   Chief Complaint: left thumb trigger digit HPI: 53 yo rhd female with triggering left thumb x 6 weeks.  It is bothersome to her.  No previous triggering.  She wishes to have a trigger release.    Past Medical History  Diagnosis Date  . Hyperlipidemia     Past Surgical History  Procedure Laterality Date  . Breast biopsy       Core biopsy done on April 2003  . Partial hysterectomy   10/22/1999     Still has cervix  . Colonoscopy      Family History  Problem Relation Age of Onset  . Breast cancer Maternal Grandmother   . Breast cancer Maternal Aunt   . Hypertension Mother   . Lung cancer Father   . Colon cancer Neg Hx   . Esophageal cancer Neg Hx   . Stomach cancer Neg Hx    Social History:  reports that she has been smoking Cigarettes.  She has a 13.5 pack-year smoking history. She has never used smokeless tobacco. She reports that she drinks alcohol. She reports that she does not use illicit drugs.  Allergies: No Known Allergies  No prescriptions prior to admission    No results found for this or any previous visit (from the past 48 hour(s)).  No results found.   A comprehensive review of systems was negative.  Height 5\' 6"  (1.676 m), weight 90.719 kg (200 lb).  General appearance: alert, cooperative and appears stated age Head: Normocephalic, without obvious abnormality, atraumatic Neck: supple, symmetrical, trachea midline Resp: clear to auscultation bilaterally Cardio: regular rate and rhythm GI: non tender Extremities: intact sensation and capillary refill all digits. +epl/fpl/io.  triggering left thumb.  no wounds. Pulses: 2+ and symmetric Skin: Skin color, texture, turgor normal. No rashes or lesions Neurologic: Grossly normal Incision/Wound: none  Assessment/Plan Left thumb trigger digit.  Non operative and operative treatment options were discussed with the patient and patient wishes to proceed with operative  treatment. Risks, benefits, and alternatives of surgery were discussed and the patient agrees with the plan of care.   Blyss Lugar R 12/25/2013, 8:29 AM

## 2013-12-25 NOTE — Brief Op Note (Signed)
12/25/2013  11:46 AM  PATIENT:  Betty Archer  53 y.o. female  PRE-OPERATIVE DIAGNOSIS:  LEFT THUMB TRIGGER DIGIT   POST-OPERATIVE DIAGNOSIS:  LEFT THUMB TRIGGER DIGIT  PROCEDURE:  Procedure(s): LEFT THUMB TRIGGER RELEASE  (Left)  SURGEON:  Surgeon(s) and Role:    * Leanora Cover, MD - Primary  PHYSICIAN ASSISTANT:   ASSISTANTS: none   ANESTHESIA:   Bier block with sedation  EBL:     BLOOD ADMINISTERED:none  DRAINS: none   LOCAL MEDICATIONS USED:  MARCAINE     SPECIMEN:  No Specimen  DISPOSITION OF SPECIMEN:  N/A  COUNTS:  YES  TOURNIQUET:   Total Tourniquet Time Documented: Forearm (Left) - 21 minutes Total: Forearm (Left) - 21 minutes   DICTATION: .Other Dictation: Dictation Number [id number]  PLAN OF CARE: Discharge to home after PACU  PATIENT DISPOSITION:  PACU - hemodynamically stable.

## 2013-12-25 NOTE — Op Note (Signed)
[id number] 

## 2013-12-25 NOTE — Op Note (Signed)
NAMEALLANAH, Betty Archer NO.:  [id number]  MEDICAL RECORD NO.:  [id number]  LOCATION:                                 FACILITY:  PHYSICIAN:  Leanora Cover, MD        DATE OF BIRTH:  [birth year]  DATE OF PROCEDURE:  12/25/2013 DATE OF DISCHARGE:                              OPERATIVE REPORT   PREOPERATIVE DIAGNOSIS:  Left thumb trigger digit.  POSTOPERATIVE DIAGNOSIS:  Left thumb trigger digit.  PROCEDURE:  Left thumb trigger release.  SURGEON:  Leanora Cover, MD.  ASSISTANT:  None.  ANESTHESIA:  Bier block with sedation.  IV FLUIDS:  Per anesthesia flow sheet.  ESTIMATED BLOOD LOSS:  Minimal.  COMPLICATIONS:  None.  SPECIMENS:  None.  TOURNIQUET TIME:  21 minutes.  DISPOSITION:  Stable to PACU.  INDICATIONS:  Betty Archer is a 53 year old female who has had triggering of the left thumb.  This was very bothersome to her.  She wished to have it released.  Risks, benefits, and alternatives of surgery were discussed including the risk of blood loss, infection, damage to nerves, vessels, tendons, ligaments, bone; failure of surgery; need for additional surgery, complications with wound healing, continued pain, and recurrence of triggering.  We also discussed that the nodule in the tendon will still be present and uncomfortable to palpation.  I will likely decrease over time.  She voiced understanding of these risks and elected to proceed.  OPERATIVE COURSE:  After being identified preoperatively by myself, the patient and I agreed upon procedure and site of procedure.  Surgical site was marked.  The risks, benefits, and alternatives of surgery were reviewed and she wished to proceed.  Surgical consent had been signed. She was given IV Ancef as preoperative antibiotic prophylaxis.  She was transferred to the operating room and placed on the operating table in supine position with left upper extremity on arm board.  Bier block anesthesia was induced by  anesthesiologist.  The left upper extremity was prepped and draped in normal sterile orthopedic fashion.  Surgical pause was performed between surgeons, anesthesia, and operating staff, and all were in agreement as to the patient, procedure, and site of procedure.  Tourniquet at proximal aspect of the forearm had been inflated for the Bier block.  Incision was made at the volar aspect of the MP joint of the thumb at the proximal flexion crease through the skin only.  This was carried into subcutaneous tissues by spreading technique.  The radial and ulnar digital nerves were identified and protected throughout the case.  The tendon was identified.  The A1 pulley was identified and incised sharply.  Care was taken to ensure complete release of the A1 pulley.  The sheath was spread.  The tendon was brought out through the wound.  The IP joint was able to be flexed and extended without any recurrence of triggering.  The wound was copiously irrigated with sterile saline and closed with 4-0 nylon in a horizontal mattress fashion.  It was then injected with 7 mL of 0.25% plain Marcaine to aid in postoperative analgesia.  It was dressed with sterile Xeroform, 4x4s, and wrapped with Doreene Nest, and  Coban dressing lightly.  Tourniquet deflated at 21 minutes.  Fingertips were pink with brisk capillary refill after deflation of tourniquet.  Operative drapes were broken down.  The patient was awoken from anesthesia safely.  She was transferred back to stretcher and taken to PACU in stable condition. I will see her back in the office in 1 week for postoperative followup. I gave her Norco 5/325, 1-2 p.o. q.6 hours p.r.n. pain, dispensed #40.     Leanora Cover, MD     KK/MEDQ  D:  12/25/2013  T:  12/25/2013  Job:  [id number]

## 2013-12-25 NOTE — Transfer of Care (Signed)
Immediate Anesthesia Transfer of Care Note  Patient: Betty Archer  Procedure(s) Performed: Procedure(s): LEFT THUMB TRIGGER RELEASE  (Left)  Patient Location: PACU  Anesthesia Type:Bier block  Level of Consciousness: awake, sedated and patient cooperative  Airway & Oxygen Therapy: Patient Spontanous Breathing and Patient connected to face mask oxygen  Post-op Assessment: Report given to PACU RN and Post -op Vital signs reviewed and stable  Post vital signs: Reviewed and stable  Complications: No apparent anesthesia complications

## 2013-12-25 NOTE — Anesthesia Preprocedure Evaluation (Signed)
Anesthesia Evaluation  Patient identified by MRN, date of birth, ID band Patient awake    Reviewed: Allergy & Precautions, H&P , NPO status , Patient's Chart, lab work & pertinent test results  Airway Mallampati: I  TM Distance: >3 FB Neck ROM: Full    Dental   Pulmonary Current Smoker,          Cardiovascular     Neuro/Psych    GI/Hepatic   Endo/Other    Renal/GU      Musculoskeletal   Abdominal   Peds  Hematology   Anesthesia Other Findings   Reproductive/Obstetrics                             Anesthesia Physical Anesthesia Plan  ASA: II  Anesthesia Plan: Bier Block   Post-op Pain Management:    Induction: Intravenous  Airway Management Planned: Simple Face Mask  Additional Equipment:   Intra-op Plan:   Post-operative Plan:   Informed Consent: I have reviewed the patients History and Physical, chart, labs and discussed the procedure including the risks, benefits and alternatives for the proposed anesthesia with the patient or authorized representative who has indicated his/her understanding and acceptance.     Plan Discussed with: CRNA and Surgeon  Anesthesia Plan Comments:         Anesthesia Quick Evaluation

## 2013-12-26 ENCOUNTER — Encounter (HOSPITAL_BASED_OUTPATIENT_CLINIC_OR_DEPARTMENT_OTHER): Payer: Self-pay | Admitting: Orthopedic Surgery

## 2013-12-26 NOTE — Addendum Note (Signed)
Addendum  created 12/26/13 0720 by Tawni Millers, CRNA   Modules edited: Charges VN

## 2014-02-07 ENCOUNTER — Encounter (HOSPITAL_COMMUNITY): Payer: Self-pay

## 2014-02-07 ENCOUNTER — Emergency Department (HOSPITAL_COMMUNITY): Payer: BC Managed Care – PPO

## 2014-02-07 ENCOUNTER — Emergency Department (HOSPITAL_COMMUNITY)
Admission: EM | Admit: 2014-02-07 | Discharge: 2014-02-07 | Disposition: A | Discharge status: Home / Self Care | Payer: BC Managed Care – PPO | Attending: Emergency Medicine | Admitting: Emergency Medicine

## 2014-02-07 DIAGNOSIS — S199XXA Unspecified injury of neck, initial encounter: Secondary | ICD-10-CM | POA: Insufficient documentation

## 2014-02-07 DIAGNOSIS — R0789 Other chest pain: Secondary | ICD-10-CM

## 2014-02-07 DIAGNOSIS — Y92410 Unspecified street and highway as the place of occurrence of the external cause: Secondary | ICD-10-CM | POA: Insufficient documentation

## 2014-02-07 DIAGNOSIS — Y998 Other external cause status: Secondary | ICD-10-CM | POA: Insufficient documentation

## 2014-02-07 DIAGNOSIS — Z720 Tobacco use: Secondary | ICD-10-CM | POA: Diagnosis not present

## 2014-02-07 DIAGNOSIS — Z79899 Other long term (current) drug therapy: Secondary | ICD-10-CM | POA: Insufficient documentation

## 2014-02-07 DIAGNOSIS — Y9389 Activity, other specified: Secondary | ICD-10-CM | POA: Diagnosis not present

## 2014-02-07 DIAGNOSIS — S0990XA Unspecified injury of head, initial encounter: Secondary | ICD-10-CM | POA: Diagnosis not present

## 2014-02-07 DIAGNOSIS — M542 Cervicalgia: Secondary | ICD-10-CM

## 2014-02-07 DIAGNOSIS — Z7982 Long term (current) use of aspirin: Secondary | ICD-10-CM | POA: Diagnosis not present

## 2014-02-07 DIAGNOSIS — E785 Hyperlipidemia, unspecified: Secondary | ICD-10-CM | POA: Insufficient documentation

## 2014-02-07 DIAGNOSIS — S299XXA Unspecified injury of thorax, initial encounter: Secondary | ICD-10-CM | POA: Diagnosis not present

## 2014-02-07 DIAGNOSIS — R52 Pain, unspecified: Secondary | ICD-10-CM

## 2014-02-07 MED ORDER — CYCLOBENZAPRINE HCL 10 MG PO TABS
5.0000 mg | ORAL_TABLET | Freq: Once | ORAL | Status: AC
  Administered 2014-02-07: 5 mg via ORAL
  Filled 2014-02-07: qty 1

## 2014-02-07 MED ORDER — CYCLOBENZAPRINE HCL 10 MG PO TABS: 10.0000 mg | ORAL_TABLET | Freq: Two times a day (BID) | ORAL | Status: DC | PRN

## 2014-02-07 MED ORDER — IBUPROFEN 800 MG PO TABS: 800.0000 mg | ORAL_TABLET | Freq: Three times a day (TID) | ORAL | Status: DC | PRN

## 2014-02-07 NOTE — ED Notes (Signed)
Patient transported to CT 
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
## 2014-02-07 NOTE — Discharge Instructions (Signed)
Read the information below.  Use the prescribed medication as directed.  Please discuss all new medications with your pharmacist.  You may return to the Emergency Department at any time for worsening condition or any new symptoms that concern you.   If you develop fevers, loss of control of bowel or bladder, weakness or numbness in your legs, or are unable to walk, return to the ER for a recheck. Please do not drive or operate any other heavy machinery while taking the prescription pain medications.
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
## 2014-03-03 ENCOUNTER — Emergency Department (HOSPITAL_COMMUNITY)
Admission: EM | Admit: 2014-03-03 | Discharge: 2014-03-03 | Disposition: A | Discharge status: Home / Self Care | Payer: BC Managed Care – PPO | Attending: Emergency Medicine | Admitting: Emergency Medicine

## 2014-03-03 ENCOUNTER — Emergency Department (HOSPITAL_COMMUNITY): Payer: BC Managed Care – PPO

## 2014-03-03 ENCOUNTER — Encounter (HOSPITAL_COMMUNITY): Payer: Self-pay | Admitting: *Deleted

## 2014-03-03 DIAGNOSIS — S6991XA Unspecified injury of right wrist, hand and finger(s), initial encounter: Secondary | ICD-10-CM | POA: Diagnosis present

## 2014-03-03 DIAGNOSIS — Y9389 Activity, other specified: Secondary | ICD-10-CM | POA: Diagnosis not present

## 2014-03-03 DIAGNOSIS — Z720 Tobacco use: Secondary | ICD-10-CM | POA: Diagnosis not present

## 2014-03-03 DIAGNOSIS — R52 Pain, unspecified: Secondary | ICD-10-CM

## 2014-03-03 DIAGNOSIS — Z8639 Personal history of other endocrine, nutritional and metabolic disease: Secondary | ICD-10-CM | POA: Diagnosis not present

## 2014-03-03 DIAGNOSIS — Y9289 Other specified places as the place of occurrence of the external cause: Secondary | ICD-10-CM | POA: Diagnosis not present

## 2014-03-03 DIAGNOSIS — R609 Edema, unspecified: Secondary | ICD-10-CM

## 2014-03-03 DIAGNOSIS — Y998 Other external cause status: Secondary | ICD-10-CM | POA: Diagnosis not present

## 2014-03-03 DIAGNOSIS — S63601A Unspecified sprain of right thumb, initial encounter: Secondary | ICD-10-CM | POA: Insufficient documentation

## 2014-03-03 DIAGNOSIS — X58XXXA Exposure to other specified factors, initial encounter: Secondary | ICD-10-CM | POA: Diagnosis not present

## 2014-03-03 MED ORDER — IBUPROFEN 800 MG PO TABS: 800.0000 mg | ORAL_TABLET | Freq: Three times a day (TID) | ORAL | Status: DC

## 2014-03-03 MED ORDER — HYDROCODONE-ACETAMINOPHEN 5-325 MG PO TABS: 2.0000 | ORAL_TABLET | ORAL | Status: DC | PRN

## 2014-03-03 NOTE — ED Notes (Signed)
X 3-4 days of swelling to rt. Hand; rt. Arm is hurting from shoulder to hand. No problems with rt. Shoulder rom.

## 2014-03-03 NOTE — ED Provider Notes (Signed)
CSN: [id number]     Arrival date & time 03/03/14  1751 History   First MD Initiated Contact with Patient 03/03/14 1849     Chief Complaint  Patient presents with  . Arm Pain     (Consider location/radiation/quality/duration/timing/severity/associated sxs/prior Treatment) Patient is a 54 y.o. female presenting with hand injury. The history is provided by the patient. No language interpreter was used.  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  She reports a student twisted thumb.   Pt complains of contined swelling  Past Medical History  Diagnosis Date  . Hyperlipidemia    Past Surgical History  Procedure Laterality Date  . Breast biopsy       Core biopsy done on April 2003  . Partial hysterectomy   10/22/1999     Still has cervix  . Colonoscopy    . Trigger finger release Left 12/25/2013    Procedure: LEFT THUMB TRIGGER RELEASE ;  Surgeon: Leanora Cover, MD;  Location: [facility];  Service: Orthopedics;  Laterality: Left;   Family History  Problem Relation Age of Onset  . Breast cancer Maternal Grandmother   . Breast cancer Maternal Aunt   . Hypertension Mother   . Lung cancer Father   . Colon cancer Neg Hx   . Esophageal cancer Neg Hx   . Stomach cancer Neg Hx    History  Substance Use Topics  . Smoking status: Current Every Day Smoker -- 0.50 packs/day for 27 years    Types: Cigarettes  . Smokeless tobacco: Never Used  . Alcohol Use: 0.0 oz/week     Comment:  patient is a social drinker   OB History    No data available     Review of Systems  Musculoskeletal: Negative for back pain.  All other systems  reviewed and are negative.     Allergies  Review of patient's allergies indicates no known allergies.  Home Medications   Prior to Admission medications   Medication Sig Start Date End Date Taking? Authorizing Provider  cyclobenzaprine (FLEXERIL) 10 MG tablet Take 1 tablet (10 mg total) by mouth 2 (two) times daily as needed for muscle spasms. 02/07/14  Yes Clayton Bibles, PA-C  ibuprofen (ADVIL,MOTRIN) 800 MG tablet Take 1 tablet (800 mg total) by mouth every 8 (eight) hours as needed for mild pain or moderate pain. 02/07/14  Yes Clayton Bibles, PA-C  HYDROcodone-acetaminophen [facility]) 5-325 MG per tablet 1-2 tabs po q6 hours prn pain 12/25/13   Leanora Cover, MD  simvastatin (ZOCOR) 40 MG tablet Take 1 tablet (40 mg total) by mouth daily at 6 PM. Patient not taking: Reported on 03/03/2014 08/13/13   Lucious Groves, DO   BP 145/93 mmHg  Pulse 87  Temp(Src) 97.9 F (36.6 C)  Resp 18  Ht 5\' 6"  (1.676 m)  Wt 200 lb (90.719 kg)  BMI 32.30 kg/m2  SpO2 98% Physical Exam  Constitutional: She appears well-developed and well-nourished.  HENT:  Head: Normocephalic.  Right Ear: External ear normal.  Left Ear: External ear normal.  Eyes: Conjunctivae and EOM are  normal. Pupils are equal, round, and reactive to light.  Neck: Normal range of motion.  Cardiovascular: Normal rate and normal heart sounds.   Pulmonary/Chest: Effort normal and breath sounds normal.  Abdominal: Soft.  Neurological: She is alert.  Skin: Skin is warm.  Psychiatric: She has a normal mood and affect.  Nursing note and vitals reviewed.   ED Course  Procedures (including critical care time) Labs Review Labs Reviewed - No data to display  Imaging Review Dg Finger Thumb Right  03/03/2014   CLINICAL DATA:  Right thumb pain and swelling.  EXAM: RIGHT THUMB 2+V  COMPARISON:  None.  FINDINGS: No fracture or dislocation. The alignment is maintained. Joint space narrowing and osteophytes at the interphalangeal joint consistent  with osteoarthritis. No osseous erosions. No focal soft tissue abnormality.  IMPRESSION: Osteoarthritis of the interphalangeal joint. No acute bony abnormality.   Electronically Signed   By: Jeb Levering M.D.   On: 03/03/2014 20:11     EKG Interpretation None      MDM   Final diagnoses:  Thumb sprain, right, initial encounter     Splint thumb Ibuprofen Hydrocodone Schedule to see Dr. Fredna Dow for recheck   Fransico Meadow, PA-C 03/03/14 2058  Quintella Reichert, MD 03/03/14 2336

## 2014-03-03 NOTE — Progress Notes (Signed)
Orthopedic Tech Progress Note Patient Details:  Betty Archer March 28, 1960 [id number] Applied Velcro thumb spica splint to Rt. thumb.  Motion, sensation intact before and after splinting.  Capillary refill less than 2 seconds before and after splinting. Ortho Devices Type of Ortho Device: Thumb velcro splint Ortho Device/Splint Location: RUE Ortho Device/Splint Interventions: Application   Darrol Poke 03/03/2014, 10:11 PM

## 2014-03-03 NOTE — Discharge Instructions (Signed)
Thumb Sprain °Your exam shows you have a sprained thumb. This means the ligaments around the joint have been torn. Thumb sprains usually take 3-6 weeks to heal. However, severe, unstable sprains may need to be fixed surgically. Sometimes a small piece of bone is pulled off by the ligament. If this is not treated properly, a sprained thumb can lead to a painful, weak joint. Treatment helps reduce pain and shortens the period of disability. °The thumb, and often the wrist, must remain splinted for the first 2-4 weeks to protect the joint. Keep your hand elevated and apply ice packs frequently to the injured area (20-30 minutes every 2-3 hours) for the next 2-4 days. This helps reduce swelling and control pain. Pain medicine may also be used for several days. Motion and strengthening exercises may later be prescribed for the joint to return to normal function. Be sure to see your doctor for follow-up because your thumb joint may require further support with splints, bandages or tape. Please see your doctor or go to the emergency room right away if you have increased pain despite proper treatment, or a numb, cold, or pale thumb. °Document Released: 03/16/2004 Document Revised: 05/01/2011 Document Reviewed: 02/08/2008 °ExitCare® Patient Information ©2015 [employer]. This information is not intended to replace advice given to you by your health care provider. Make sure you discuss any questions you have with your health care provider. ° °

## 2014-03-06 ENCOUNTER — Encounter (HOSPITAL_COMMUNITY): Payer: Self-pay | Admitting: Emergency Medicine

## 2014-03-06 ENCOUNTER — Emergency Department (INDEPENDENT_AMBULATORY_CARE_PROVIDER_SITE_OTHER)
Admission: EM | Admit: 2014-03-06 | Discharge: 2014-03-06 | Disposition: A | Discharge status: Home / Self Care | Payer: Worker's Compensation | Source: Home / Self Care | Attending: Emergency Medicine | Admitting: Emergency Medicine

## 2014-03-06 DIAGNOSIS — S66911A Strain of unspecified muscle, fascia and tendon at wrist and hand level, right hand, initial encounter: Secondary | ICD-10-CM

## 2014-03-06 DIAGNOSIS — IMO0001 Reserved for inherently not codable concepts without codable children: Secondary | ICD-10-CM

## 2014-03-06 NOTE — ED Notes (Signed)
Patient was very agitated at discharge.  Patient not pleased with work note presented today.  Stressed patient to call today's referral first thing Monday morning.  Patient wanting a note extending through next week.  Declined patient's request.

## 2014-03-06 NOTE — ED Notes (Signed)
Right hand pain/injkury on 1/8.  Patient was seen at emergency department on 1/12 for increasing right wrist pain.  Patient reports she does not like dr Fredna Dow and will not see him.  Patient reports this is a workers comp injury
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
## 2014-03-06 NOTE — Discharge Instructions (Signed)
Continue to wear the brace. Take ibuprofen 600-800mg  3 times a day. Use the hydrocodone as needed for severe pain. Apply ice 2-3 times a day. Follow up with Dr. Caralyn Guile for further evaluation.

## 2014-03-13 ENCOUNTER — Encounter: Payer: Self-pay | Admitting: Internal Medicine

## 2014-03-18 ENCOUNTER — Ambulatory Visit: Payer: Self-pay | Admitting: Internal Medicine

## 2014-03-18 ENCOUNTER — Encounter: Payer: Self-pay | Admitting: Internal Medicine

## 2014-04-02 ENCOUNTER — Ambulatory Visit (INDEPENDENT_AMBULATORY_CARE_PROVIDER_SITE_OTHER): Payer: BC Managed Care – PPO | Admitting: Internal Medicine

## 2014-04-02 VITALS — BP 156/89 | HR 107 | Temp 98.0°F | Wt 203.1 lb

## 2014-04-02 DIAGNOSIS — Z0000 Encounter for general adult medical examination without abnormal findings: Secondary | ICD-10-CM

## 2014-04-02 DIAGNOSIS — Z8673 Personal history of transient ischemic attack (TIA), and cerebral infarction without residual deficits: Secondary | ICD-10-CM | POA: Diagnosis not present

## 2014-04-02 DIAGNOSIS — Z23 Encounter for immunization: Secondary | ICD-10-CM

## 2014-04-02 DIAGNOSIS — M79644 Pain in right finger(s): Secondary | ICD-10-CM

## 2014-04-02 DIAGNOSIS — M79645 Pain in left finger(s): Secondary | ICD-10-CM

## 2014-04-02 MED ORDER — ACETAMINOPHEN-CODEINE #3 300-30 MG PO TABS: 1.0000 | ORAL_TABLET | Freq: Three times a day (TID) | ORAL | Status: DC | PRN

## 2014-04-02 MED ORDER — GABAPENTIN 300 MG PO CAPS: 300.0000 mg | ORAL_CAPSULE | Freq: Three times a day (TID) | ORAL | Status: DC

## 2014-04-02 NOTE — Patient Instructions (Addendum)
-  Take tylenol #3 every 8 hrs as needed for pain -Start taking gabapentin 300 mg once the first day, then twice a day for the 2nd day, and then three times daily after that -Will refer you to orthopedic surgery -Will give you a flu shot today -Start taking aspirin 81 mg daily and your zocor   General Instructions:   Please bring your medicines with you each time you come to clinic.  Medicines may include prescription medications, over-the-counter medications, herbal remedies, eye drops, vitamins, or other pills.   Progress Toward Treatment Goals:  No flowsheet data found.  Self Care Goals & Plans:  Self Care Goal 04/02/2014  Manage my medications take my medicines as prescribed; bring my medications to every visit; refill my medications on time  Monitor my health keep track of my blood pressure  Eat healthy foods eat foods that are low in salt; eat baked foods instead of fried foods  Stop smoking go to the [place] website (https://scott-booker.info/); cut down the number of cigarettes smoked    No flowsheet data found.   Care Management & Community Referrals:  No flowsheet data found.

## 2014-04-02 NOTE — Progress Notes (Signed)
Patient ID: Betty Archer, female   DOB: 1961/02/06, 54 y.o.   MRN: [id number]    Subjective:   Patient ID: Betty Archer female   DOB: [birth year] 54 y.o.   MRN: [id number]  HPI: [name] is a 54 y.o. pleasant woman with past medical history of obesity, hyperlipidemia, questionable TIA, and tobacco abuse who presents with chief complaint of bilateral thumb pain.   She reports having a recent work-related injury while trying to restrain a child on 03/03/14 that required ED admission for right thumb pain and swelling. Imaging revealed osteoarthritis of the interphalangeal joint. She was given a right thumb splint, ibuprofen, hydrocodone, and to follow-up with orthopedist Dr. Fredna Dow who she had seen previously for left thumb trigger finger release surgery on 12/26/14 after sustaining a left thumb injury 11/06/13 at work while releasing a seatbelt. She declined corticosteroid injection at that time and instead wanted surgery done which helped temporarily but reports the pain has returned.    At this time she continues to have bilateral thumb pain which she describes as burning, shocking, and popping with decrease in sensation and weakness in her hands but denies dropping objects. The pain will radiate to her wrist and up to her forearm at times. It also wakes her up from sleep. She denies history of carpal tunnel syndrome or recent overuse. She is currently using a right wrist splint, applying ice, resting her hands, and elevating them. She has had no relief of pain with using OTC tylenol, advil, or ibuprofen. She reports hydrocodone was helpful that given to her by the ED. She denies recent injury. She would like to see a different orthopedic surgeon.   She had a questionable TIA June of 2015 and reports she is noncompliant with taking aspirin and simvastatin daily.   She reports having a recent mammogram performed. She is status post hysterectomy. She would like to have a flu shot today. She  denies fever, chills, or recent illness.    Past Medical History  Diagnosis Date  . Hyperlipidemia    Current Outpatient Prescriptions  Medication Sig Dispense Refill  . HYDROcodone-acetaminophen (NORCO/VICODIN) 5-325 MG per tablet Take 2 tablets by mouth every 4 (four) hours as needed for moderate pain or severe pain. 10 tablet 0  . ibuprofen (ADVIL,MOTRIN) 800 MG tablet Take 1 tablet (800 mg total) by mouth 3 (three) times daily. 21 tablet 0  . simvastatin (ZOCOR) 40 MG tablet Take 1 tablet (40 mg total) by mouth daily at 6 PM. (Patient not taking: Reported on 03/03/2014) 30 tablet 11   No current facility-administered medications for this visit.   Family History  Problem Relation Age of Onset  . Breast cancer Maternal Grandmother   . Breast cancer Maternal Aunt   . Hypertension Mother   . Lung cancer Father   . Colon cancer Neg Hx   . Esophageal cancer Neg Hx   . Stomach cancer Neg Hx    History   Social History  . Marital Status: Single    Spouse Name: N/A  . Number of Children: N/A  . Years of Education: N/A   Occupational History  .  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 Smoking status: Current Every Day Smoker -- 0.50 packs/day for 27 years    Types: Cigarettes  . Smokeless tobacco: Never Used  . Alcohol Use: 0.0 oz/week     Comment:  patient is a social drinker  .  Drug Use: No  . Sexual Activity: Not on file   Other Topics Concern  . 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   Review of Systems: Review of Systems  Constitutional: Negative for fever and chills.  HENT: Negative for congestion and sore throat.   Eyes: Positive for blurred vision (chronic).  Respiratory: Negative for cough, shortness of breath and wheezing.   Cardiovascular: Negative for chest pain and leg swelling.  Gastrointestinal: Negative for nausea,  vomiting, abdominal pain, diarrhea, constipation and blood in stool.  Genitourinary: Negative for dysuria, urgency, frequency and hematuria.  Musculoskeletal: Positive for joint pain (bilateral thumbs). Negative for neck pain.  Neurological: Positive for sensory change (b/l hands) and focal weakness (b/l hands). Negative for dizziness and headaches.     Objective:  Physical Exam: Filed Vitals:   04/02/14 1535  BP: 156/89  Pulse: 107  Temp: 98 F (36.7 C)  TempSrc: Oral  Weight: 203 lb 1.6 oz (92.126 kg)  SpO2: 99%    Physical Exam  Constitutional: She is oriented to person, place, and time. She appears well-developed and well-nourished. No distress.  HENT:  Head: Normocephalic and atraumatic.  Eyes: EOM are normal.  Neck: Normal range of motion. Neck supple.  Cardiovascular: Normal rate and regular rhythm.   Pulmonary/Chest: Effort normal and breath sounds normal. No respiratory distress. She has no wheezes. She has no rales.  Abdominal: Soft. Bowel sounds are normal. She exhibits no distension. There is no tenderness. There is no rebound and no guarding.  Musculoskeletal: Normal range of motion. She exhibits edema (mild overlying right thumb) and tenderness (Mild overlyng right and left thumbs).  Normal ROM of digits and wrist.  Neurological: She is alert and oriented to person, place, and time. No cranial nerve deficit. She exhibits normal muscle tone. Coordination normal.  Decreased sensation to light touch of left hand. Normal b/l UE, wrist, finger strength, and grip strength.   Skin: Skin is warm and dry. No rash noted. She is not diaphoretic. No erythema. No pallor.  Psychiatric: She has a normal mood and affect. Her behavior is normal. Judgment and thought content normal.     Assessment & Plan:   Please see problem list for problem-based assessment and plan

## 2014-04-04 ENCOUNTER — Encounter: Payer: Self-pay | Admitting: Internal Medicine

## 2014-04-04 DIAGNOSIS — I10 Essential (primary) hypertension: Secondary | ICD-10-CM | POA: Insufficient documentation

## 2014-04-04 DIAGNOSIS — M180 Bilateral primary osteoarthritis of first carpometacarpal joints: Secondary | ICD-10-CM | POA: Insufficient documentation

## 2014-04-04 MED ORDER — SIMVASTATIN 40 MG PO TABS: 40.0000 mg | ORAL_TABLET | Freq: Every day | ORAL | Status: DC

## 2014-04-04 MED ORDER — ASPIRIN EC 81 MG PO TBEC: 81.0000 mg | DELAYED_RELEASE_TABLET | Freq: Every day | ORAL | Status: AC

## 2014-04-04 NOTE — Assessment & Plan Note (Signed)
Assessment: Pt with left thumb work-injury on 11/06/13 s/p left thumb trigger release on 12/26/14 and right thumb work-injury on 03/03/14 who presents with bilateral thumb pain with subjective hand weakness and paresthesias concerning for neuropathic pain in setting of probable trigger finger.   Plan:  -Pt instructed to continue brace support/thumb splint, avoid overuse, and apply ice if helpful  -Prescribe gabapentin 300 mg TID (pt instructed to take 300 mg 1st day, then 300 BID 2nd day, and then 300 TID for probable neuropathic pain -Prescribe acetaminophen-codeine 300-30 mg 1-2 tabs Q 8 hr PRN pain  -Refer to orthopedic surgery for further therapy and management including consideration of corticosteroid injection, PT, and surgery if indicated

## 2014-04-04 NOTE — Assessment & Plan Note (Signed)
Assessment: Pt with no previous diagnosis of hypertension with 2 previous elevated readings in past year who presents with elevated blood pressure of 156/89 in setting of uncontrolled pain.  Plan:  -BP 156/89 not at goal <140/90, however in setting of pain  -Continue to monitor blood pressure readings -Encourage decreasing salt intake

## 2014-04-04 NOTE — Assessment & Plan Note (Signed)
-  Pt is s/p hysterectomy, not candidate for PAP smear testing -Annual influenza vaccination administered on 04/02/14 -Pt reports having recent mammography performed, will need to obtain records

## 2014-04-04 NOTE — Assessment & Plan Note (Signed)
Assessment: Pt with questionable TIA in June 2015 noncompliant with AP and lipid lowering therapy.    Plan: -Pt instructed to restart aspirin 81 mg daily and simvastatin 40 mg daily  -Encourage smoking cessation and lifestyle modification

## 2014-04-07 NOTE — Progress Notes (Signed)
Internal Medicine Clinic Attending Date of visit: 04/02/2014  Case discussed with Dr. Naaman Plummer soon after the resident saw the patient.  We reviewed the resident's history and exam and pertinent patient test results.  I agree with the assessment, diagnosis, and plan of care documented in the resident's note.

## 2014-04-30 ENCOUNTER — Encounter: Payer: Self-pay | Admitting: *Deleted

## 2014-10-16 ENCOUNTER — Encounter: Payer: Self-pay | Admitting: Internal Medicine

## 2014-10-16 ENCOUNTER — Ambulatory Visit (INDEPENDENT_AMBULATORY_CARE_PROVIDER_SITE_OTHER): Payer: Self-pay | Admitting: Internal Medicine

## 2014-10-16 VITALS — BP 114/73 | HR 71 | Temp 98.3°F | Ht 66.0 in | Wt 205.1 lb

## 2014-10-16 DIAGNOSIS — E559 Vitamin D deficiency, unspecified: Secondary | ICD-10-CM

## 2014-10-16 DIAGNOSIS — Z6833 Body mass index (BMI) 33.0-33.9, adult: Secondary | ICD-10-CM

## 2014-10-16 DIAGNOSIS — E669 Obesity, unspecified: Secondary | ICD-10-CM

## 2014-10-16 DIAGNOSIS — R7303 Prediabetes: Secondary | ICD-10-CM

## 2014-10-16 DIAGNOSIS — Z23 Encounter for immunization: Secondary | ICD-10-CM

## 2014-10-16 DIAGNOSIS — E785 Hyperlipidemia, unspecified: Secondary | ICD-10-CM

## 2014-10-16 DIAGNOSIS — R7309 Other abnormal glucose: Secondary | ICD-10-CM

## 2014-10-16 DIAGNOSIS — Z0000 Encounter for general adult medical examination without abnormal findings: Secondary | ICD-10-CM

## 2014-10-16 LAB — POCT GLYCOSYLATED HEMOGLOBIN (HGB A1C): HEMOGLOBIN A1C: 5.3

## 2014-10-16 LAB — GLUCOSE, CAPILLARY: GLUCOSE-CAPILLARY: 74 mg/dL (ref 65–99)

## 2014-10-16 NOTE — Progress Notes (Signed)
Patient ID: Betty Archer, female   DOB: [birth year], 54 y.o.   MRN: [id number]    Subjective:   Patient ID: Betty Archer female   DOB: [birth year] 54 y.o.   MRN: [id number]  HPI: Betty Archer is a 54 y.o. pleasant woman with past medical history of obesity, hyperlipidemia, questionable TIA, and tobacco abuse who presents for routine follow-up.   Her last lipid panel on 08/12/13 revealed LDL 169. She reports compliance with taking zocor with no myalgias or muscle weakness.   Her last A1c was 5.7 on 08/12/13. She denies blurry vision, polydipsia, polyuria, polyphagia, or neuropathy. She tries to follow a healthy diet and exercise.    She has never had her vitamin D level checked. She denies recent fall, fracture, or bone pain. She is not currently on vitamin D replacement or corticosteroids.   She reports being overdue for screening mammogram.   She would like to have a flu shot today.    Past Medical History  Diagnosis Date  . Hyperlipidemia    Current Outpatient Prescriptions  Medication Sig Dispense Refill  . acetaminophen-codeine (TYLENOL #3) 300-30 MG per tablet Take 1-2 tablets by mouth every 8 (eight) hours as needed for moderate pain. 90 tablet 0  . aspirin EC 81 MG tablet Take 1 tablet (81 mg total) by mouth daily.    Marland Kitchen gabapentin (NEURONTIN) 300 MG capsule Take 1 capsule (300 mg total) by mouth 3 (three) times daily. 90 capsule 0  . simvastatin (ZOCOR) 40 MG tablet Take 1 tablet (40 mg total) by mouth daily at 6 PM. 90 tablet 3   No current facility-administered medications for this visit.   Family History  Problem Relation Age of Onset  . Breast cancer Maternal Grandmother   . Breast cancer Maternal Aunt   . Hypertension Mother   . Lung cancer Father   . Colon cancer Neg Hx   . Esophageal cancer Neg Hx   . Stomach cancer Neg Hx    Social History   Social History  . Marital Status: Single    Spouse Name: N/A  . Number of Children: N/A  . Years of  Education: N/A   Occupational History  .  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 Smoking status: Current Every Day Smoker -- 0.50 packs/day for 27 years    Types: Cigarettes  . Smokeless tobacco: Never Used  . Alcohol Use: 0.0 oz/week    0 Standard drinks or equivalent per week     Comment:  patient is a social drinker  . Drug Use: No  . Sexual Activity: Not Asked   Other Topics Concern  . 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   Review of Systems: Review of Systems  Constitutional: Negative for weight loss.  Eyes: Negative for blurred vision.  Respiratory: Negative for cough, shortness of breath and wheezing.   Cardiovascular: Positive for leg swelling (b/l LE occasionally).  Gastrointestinal: Negative for nausea, vomiting, abdominal pain, diarrhea, constipation and blood in stool.  Genitourinary: Negative for dysuria, urgency, frequency and hematuria.  Musculoskeletal: Negative for myalgias and falls.       Right arm pain  Neurological: Positive for focal weakness (b/l hands ) and headaches. Negative for dizziness and sensory change.  Endo/Heme/Allergies: Negative for polydipsia. Does not bruise/bleed easily.  Psychiatric/Behavioral: Positive for depression (for 5 months, declines medical therapy ). The patient has insomnia.      Objective:  Physical Exam: Filed Vitals:   10/16/14 1456  BP: 114/73  Pulse: 71  Temp: 98.3 F (36.8 C)  TempSrc: Oral  Height: 5\' 6"  (1.676 m)  Weight: 205 lb 1.6 oz (93.033 kg)  SpO2: 100%    Physical Exam  Constitutional: She is oriented to person, place, and time. She appears well-developed and well-nourished. No distress.  HENT:  Head: Normocephalic and atraumatic.  Right Ear: External ear normal.  Left Ear: External ear normal.  Nose: Nose normal.  Mouth/Throat: Oropharynx is clear and moist. No  oropharyngeal exudate.  Eyes: Conjunctivae and EOM are normal. Pupils are equal, round, and reactive to light. Right eye exhibits no discharge. Left eye exhibits no discharge. No scleral icterus.  Neck: Normal range of motion. Neck supple.  Cardiovascular: Normal rate and regular rhythm.   No murmur heard. Distant heart sounds  Pulmonary/Chest: Effort normal and breath sounds normal. No respiratory distress. She has no wheezes. She has no rales.  Abdominal: Soft. Bowel sounds are normal. She exhibits no distension. There is no tenderness. There is no rebound and no guarding.  Musculoskeletal: Normal range of motion. She exhibits edema (mild b/l forearm R>L) and tenderness (right UE).  Neurological: She is alert and oriented to person, place, and time.  Normal 5/5 UE muscle strength and normal sensation to light touch of UE.   Skin: Skin is warm and dry. She is not diaphoretic.  Psychiatric: Her behavior is normal. Judgment and thought content normal.  Flat affect, tearful     Assessment & Plan:   Please see problem list for problem-based assessment and plan

## 2014-10-16 NOTE — Patient Instructions (Addendum)
-  Will check your bloodwork today and call you with the results -If you cholesterol is not improved will change you to lipitor  -Will give you a flu shot today and will order a mammogram through the scholarship program  -Very nice seeing you, please come back in one year or sooner if you have any problems    General Instructions:   Please bring your medicines with you each time you come to clinic.  Medicines may include prescription medications, over-the-counter medications, herbal remedies, eye drops, vitamins, or other pills.   Progress Toward Treatment Goals:  No flowsheet data found.  Self Care Goals & Plans:  Self Care Goal 10/16/2014  Manage my medications take my medicines as prescribed; bring my medications to every visit; refill my medications on time  Monitor my health -  Eat healthy foods drink diet soda or water instead of juice or soda; eat more vegetables; eat foods that are low in salt; eat baked foods instead of fried foods; eat fruit for snacks and desserts  Stop smoking -    No flowsheet data found.   Care Management & Community Referrals:  No flowsheet data found.

## 2014-10-17 DIAGNOSIS — M65319 Trigger thumb, unspecified thumb: Secondary | ICD-10-CM | POA: Insufficient documentation

## 2014-10-17 DIAGNOSIS — K648 Other hemorrhoids: Secondary | ICD-10-CM | POA: Insufficient documentation

## 2014-10-17 DIAGNOSIS — Z86010 Personal history of colonic polyps: Secondary | ICD-10-CM | POA: Insufficient documentation

## 2014-10-17 DIAGNOSIS — E559 Vitamin D deficiency, unspecified: Secondary | ICD-10-CM | POA: Insufficient documentation

## 2014-10-17 LAB — LIPID PANEL
Chol/HDL Ratio: 5.5 ratio units — ABNORMAL HIGH (ref 0.0–4.4)
Cholesterol, Total: 291 mg/dL — ABNORMAL HIGH (ref 100–199)
HDL: 53 mg/dL (ref >39)
LDL CALC: 208 mg/dL — AB (ref 0–99)
Triglycerides: 150 mg/dL — ABNORMAL HIGH (ref 0–149)
VLDL Cholesterol Cal: 30 mg/dL (ref 5–40)

## 2014-10-17 LAB — COMPREHENSIVE METABOLIC PANEL
ALK PHOS: 100 IU/L (ref 39–117)
ALT: 17 IU/L (ref 0–32)
AST: 19 IU/L (ref 0–40)
Albumin/Globulin Ratio: 1.4 (ref 1.1–2.5)
Albumin: 4.2 g/dL (ref 3.5–5.5)
BUN/Creatinine Ratio: 12 (ref 9–23)
BUN: 9 mg/dL (ref 6–24)
Bilirubin Total: <0.2 mg/dL (ref 0.0–1.2)
CALCIUM: 9.8 mg/dL (ref 8.7–10.2)
CO2: 24 mmol/L (ref 18–29)
Chloride: 103 mmol/L (ref 97–108)
Creatinine, Ser: 0.73 mg/dL (ref 0.57–1.00)
GFR calc Af Amer: 108 mL/min/{1.73_m2} (ref >59)
GFR, EST NON AFRICAN AMERICAN: 94 mL/min/{1.73_m2} (ref >59)
GLUCOSE: 72 mg/dL (ref 65–99)
Globulin, Total: 2.9 g/dL (ref 1.5–4.5)
Potassium: 4.5 mmol/L (ref 3.5–5.2)
Sodium: 141 mmol/L (ref 134–144)
Total Protein: 7.1 g/dL (ref 6.0–8.5)

## 2014-10-17 LAB — HEPATITIS C ANTIBODY

## 2014-10-17 LAB — CBC
HEMATOCRIT: 40.1 % (ref 34.0–46.6)
HEMOGLOBIN: 12.7 g/dL (ref 11.1–15.9)
MCH: 23.9 pg — ABNORMAL LOW (ref 26.6–33.0)
MCHC: 31.7 g/dL (ref 31.5–35.7)
MCV: 76 fL — ABNORMAL LOW (ref 79–97)
Platelets: 277 10*3/uL (ref 150–379)
RBC: 5.31 x10E6/uL — AB (ref 3.77–5.28)
RDW: 16.3 % — ABNORMAL HIGH (ref 12.3–15.4)
WBC: 6.8 10*3/uL (ref 3.4–10.8)

## 2014-10-17 LAB — VITAMIN D 25 HYDROXY (VIT D DEFICIENCY, FRACTURES): VIT D 25 HYDROXY: 8.4 ng/mL — AB (ref 30.0–100.0)

## 2014-10-17 MED ORDER — VITAMIN D (ERGOCALCIFEROL) 1.25 MG (50000 UNIT) PO CAPS: 50000.0000 [IU] | ORAL_CAPSULE | ORAL | Status: DC

## 2014-10-17 MED ORDER — ATORVASTATIN CALCIUM 80 MG PO TABS: 80.0000 mg | ORAL_TABLET | Freq: Every day | ORAL | Status: DC

## 2014-10-17 NOTE — Assessment & Plan Note (Signed)
Assessment: Pt with last A1c of 5.7 on 08/12/13 with no symptoms of hyper/hypoglycemia who presents with CBG of 74 and improved A1c of 5.3.   Plan:  -Obtain CBG ---> 74 -Obtain A1c ---> 5.3 -Pt counseled on continuing lifestyle modification -BMI 33.12 not at goal <25, encourage weight loss  -Repeat A1c in 1 year

## 2014-10-17 NOTE — Assessment & Plan Note (Addendum)
Assessment: Pt with last lipid panel on 08/12/13 with LDL 159 with reported compliance with moderate intensity stain therapy who presents with worsened LDL of 208 in statin benefit group due to LDL >190 with recommendations to continue moderate to high intensity statin therapy.    Plan:  -Obtain lipid panel ---> LDL worsened to 208  -Per pharmacy filling history, last filled for #30 in September 2015  -Discontinue moderate intensity simvastatin 40 mg daily -Prescribe high intensity atorvastatin 80 mg daily  -Obtain CMP ---> normal liver function  -Monitor for myalgias and myositis  -Repeat lipid panel in 3 months to assess for response

## 2014-10-17 NOTE — Assessment & Plan Note (Signed)
Assessment: Pt with no prior 25-OH vitamin D level not on replacement therapy found to have vitamin D deficiency with no recent fall or fracture.   Plan:  -Obtain 25-OH vitamin D level ---> 8.4 consistent with deficiency  -Prescribe ergocalciferol 50 K U weekly for 8 weeks  -Repeat 25-OH vitamin D level after completion of therapy

## 2014-10-17 NOTE — Assessment & Plan Note (Addendum)
-  Obtain CBC w/o diff ---> microcytosis with no anemia  -Obtain screening HCV Ab (born b/w 1946-65) ----> negative -Pt received annual influenza vaccination today on 10/16/14 -Order placed for screening mammogram (through scholarship program)

## 2014-10-19 NOTE — Progress Notes (Signed)
Internal Medicine Clinic Attending  Case discussed with Dr. Rabbani soon after the resident saw the patient.  We reviewed the resident's history and exam and pertinent patient test results.  I agree with the assessment, diagnosis, and plan of care documented in the resident's note.  

## 2014-10-21 ENCOUNTER — Other Ambulatory Visit: Payer: Self-pay | Admitting: Pharmacist

## 2014-10-21 DIAGNOSIS — Z8673 Personal history of transient ischemic attack (TIA), and cerebral infarction without residual deficits: Secondary | ICD-10-CM

## 2014-10-21 DIAGNOSIS — E785 Hyperlipidemia, unspecified: Secondary | ICD-10-CM

## 2014-10-21 MED ORDER — ATORVASTATIN CALCIUM 80 MG PO TABS: 80.0000 mg | ORAL_TABLET | Freq: Every day | ORAL | Status: DC

## 2014-10-21 MED ORDER — ROSUVASTATIN CALCIUM 40 MG PO TABS: 40.0000 mg | ORAL_TABLET | Freq: Every day | ORAL | Status: DC

## 2014-10-21 NOTE — Progress Notes (Unsigned)
Working with PCP to increase statin intensity using an agent affordable for the patient.

## 2014-10-26 ENCOUNTER — Encounter: Payer: Self-pay | Admitting: Internal Medicine

## 2014-10-26 DIAGNOSIS — D509 Iron deficiency anemia, unspecified: Secondary | ICD-10-CM | POA: Insufficient documentation

## 2014-10-26 DIAGNOSIS — R718 Other abnormality of red blood cells: Secondary | ICD-10-CM | POA: Insufficient documentation

## 2014-11-04 ENCOUNTER — Ambulatory Visit (HOSPITAL_COMMUNITY): Payer: Self-pay

## 2014-11-13 ENCOUNTER — Ambulatory Visit (HOSPITAL_COMMUNITY)
Admission: RE | Admit: 2014-11-13 | Discharge: 2014-11-13 | Disposition: A | Discharge status: Home / Self Care | Payer: Self-pay | Source: Ambulatory Visit | Attending: Internal Medicine | Admitting: Internal Medicine

## 2014-11-13 DIAGNOSIS — Z0000 Encounter for general adult medical examination without abnormal findings: Secondary | ICD-10-CM

## 2015-01-24 IMAGING — MR MR HEAD WO/W CM
11 of 16 series · 23 of 48 positions shown · IV contrast (multihance)
Comparison: CT head 08/12/2013

CLINICAL DATA: The patient experienced to episodes of right hand
flexing at the fingers and going numb for 5 minutes; these both
resolved. Evaluate for demyelinating disease versus ischemia. Stroke
risk factors include hyperlipidemia.

EXAM:
MRI HEAD WITHOUT AND WITH CONTRAST
MRA HEAD WITHOUT CONTRAST
TECHNIQUE: Multiplanar, multiecho pulse sequences of the brain and surrounding
structures were obtained without and with intravenous contrast.
Angiographic images of the head were obtained using MRA technique
without contrast.
CONTRAST:  20mL MULTIHANCE GADOBENATE DIMEGLUMINE 529 MG/ML IV SOLN

[Series 2: FLAIR · sagittal · 5.0mm · 0.47mm/px · 2 of 24 slices shown (1 of 3)]
[im 1/24]
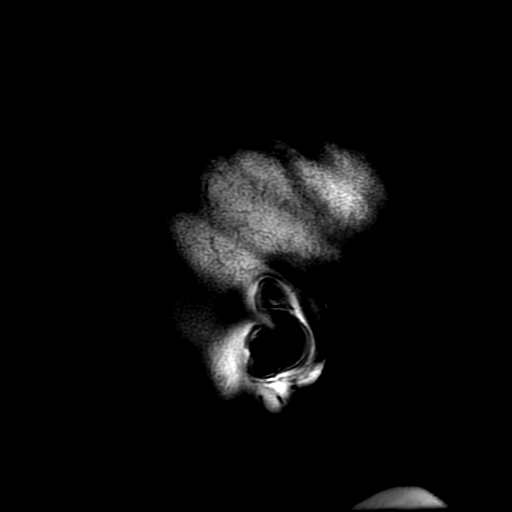
[im 24/24]
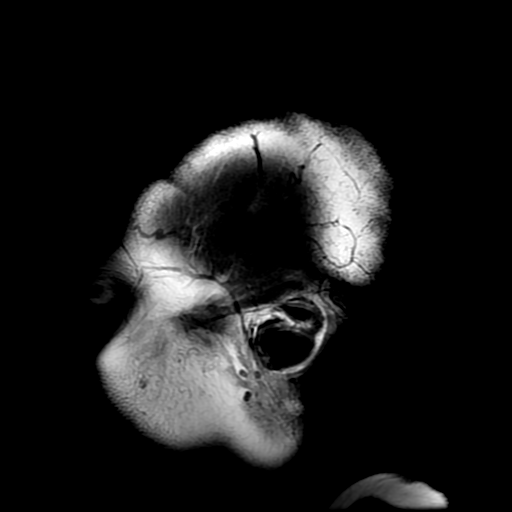

[Series 5: DWI · axial · 5.0mm · 1.02mm/px · z∈[-116,+31]mm · 2 of 62 slices shown (1 of 4)]
[im 1/62]
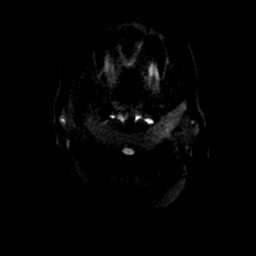
[im 62/62]
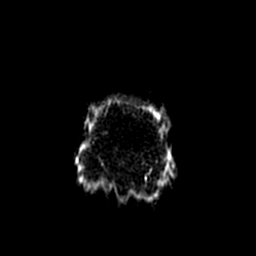

[Series 6: T2 · axial · 5.0mm · 0.43mm/px · 1 of 27 slices shown (1 of 2)]
[im 1/27]
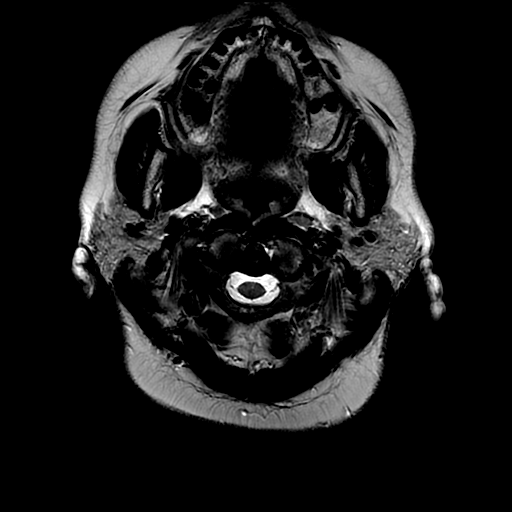

[Series 7: FLAIR · axial · 5.0mm · 0.43mm/px · 1 of 27 slices shown (2 of 3)]
[im 1/27]
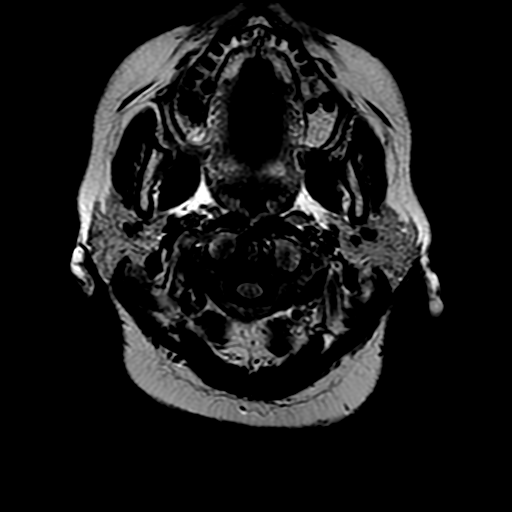

[Series 8: DWI · coronal · 5.0mm · 1.02mm/px · 3 of 70 slices shown (2 of 4)]
[im 1/70]
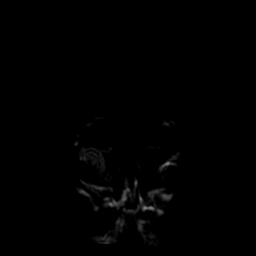
[im 35/70]
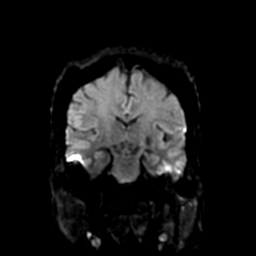
[im 70/70]
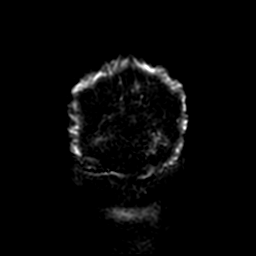

[Series 10: FLAIR · sagittal · 1.4mm · 0.50mm/px · 9 of 232 slices shown (3 of 3)]
[im 1/232]
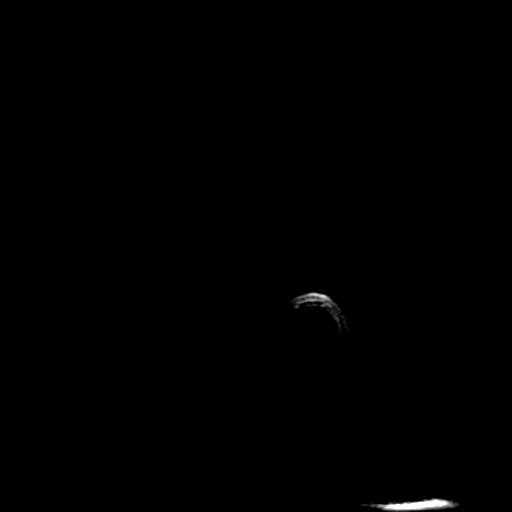
[im 29/232]
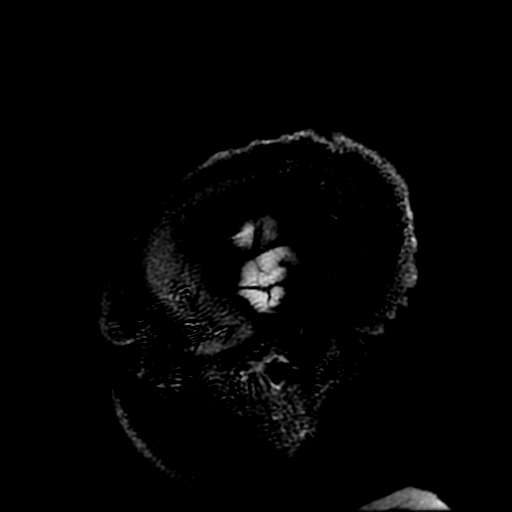
[im 58/232]
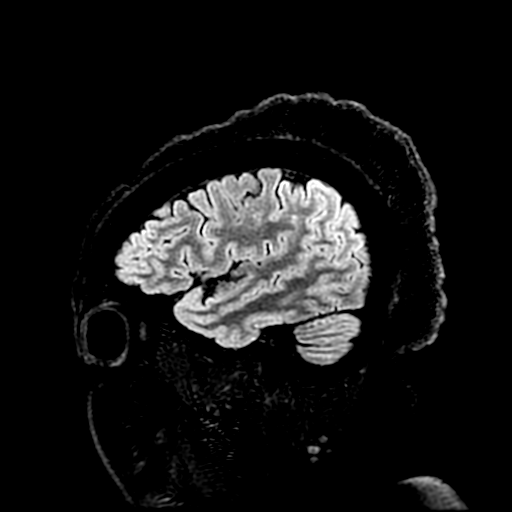
[im 87/232]
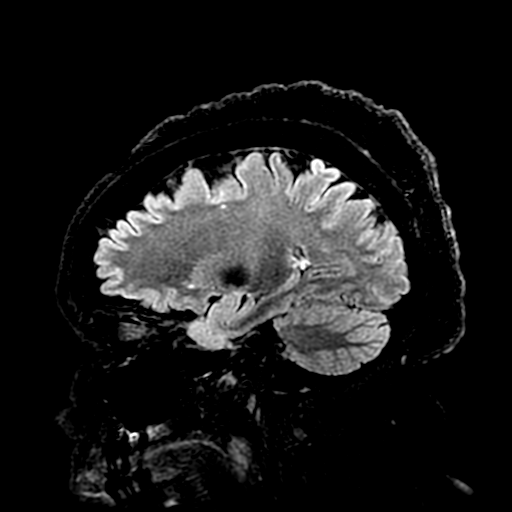
[im 116/232]
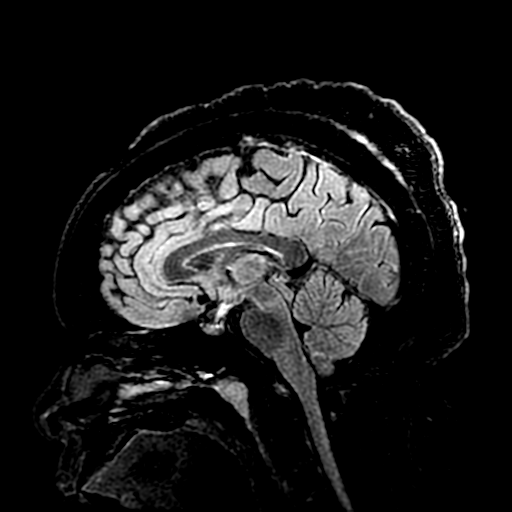
[im 145/232]
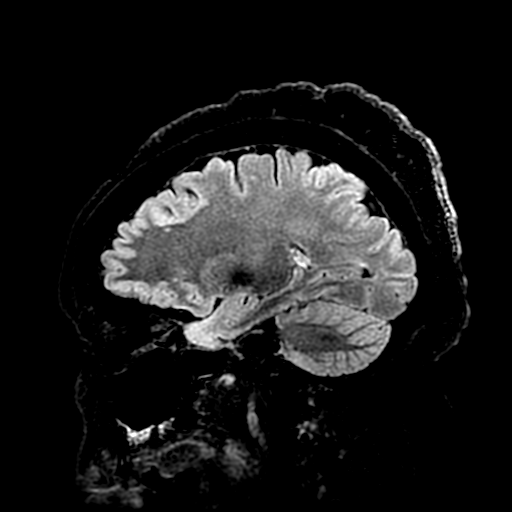
[im 174/232]
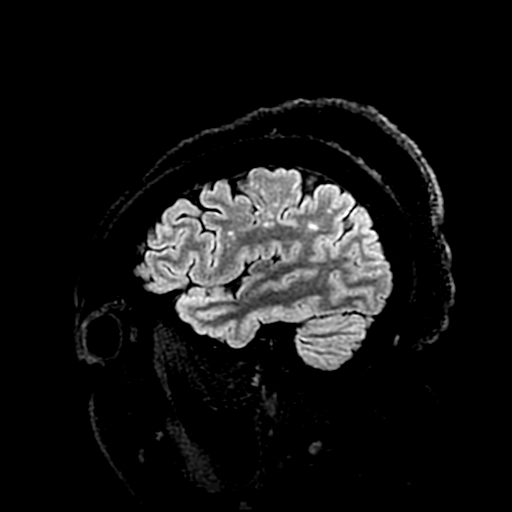
[im 203/232]
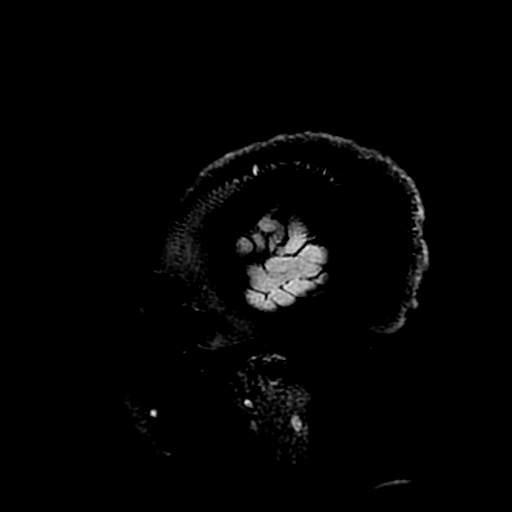
[im 232/232]
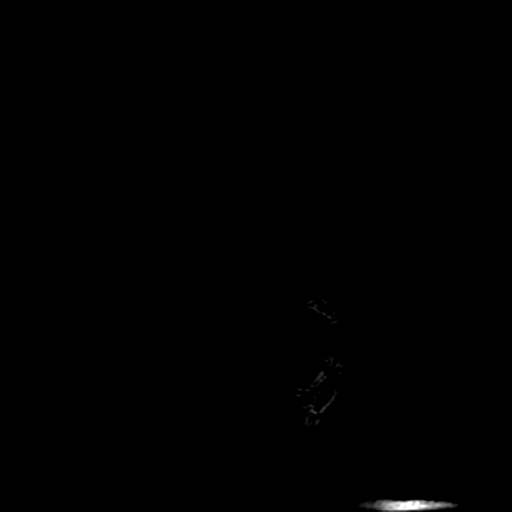

[Series 12: T2 · coronal · 5.0mm · 0.47mm/px · 1 of 30 slices shown (2 of 2)]
[im 1/30]
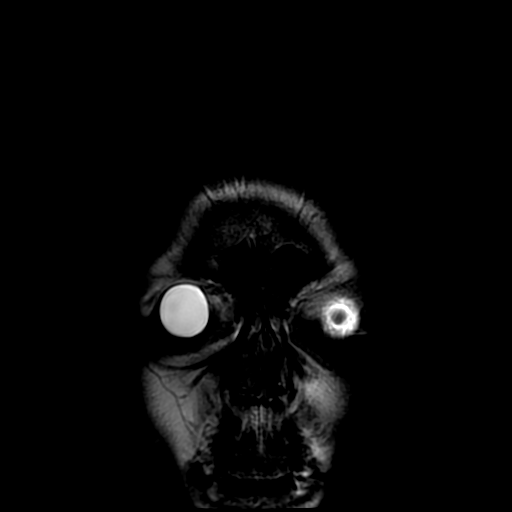

[Series 13: T1 · axial · 5.0mm · 0.47mm/px · 1 of 27 slices shown (1 of 2)]
[im 1/27]
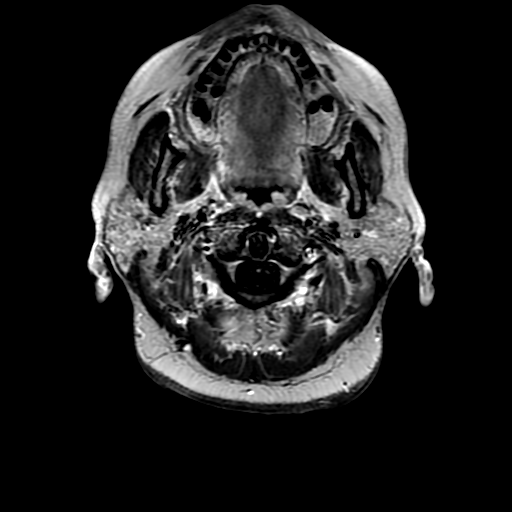

[Series 14: T1 · coronal · 5.0mm · 0.47mm/px · 1 of 30 slices shown (2 of 2)]
[im 1/30]
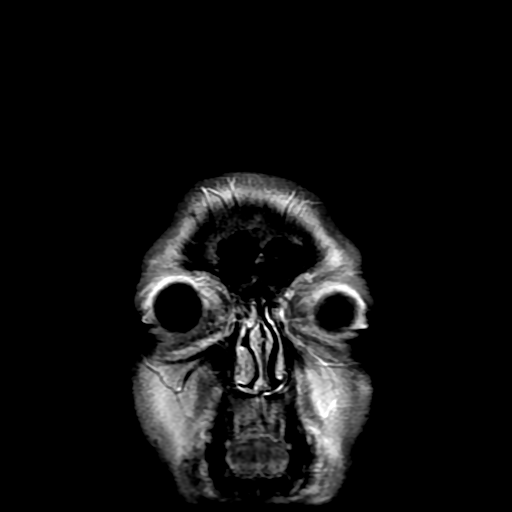

[Series 500: DWI · axial · 5.0mm · 1.02mm/px · 1 of 31 slices shown (3 of 4)]
[im 1/31]
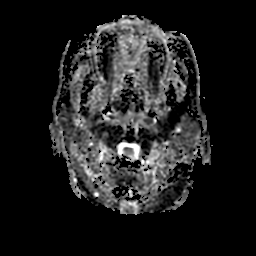

[Series 800: DWI · coronal · 5.0mm · 1.02mm/px · 1 of 35 slices shown (4 of 4)]
[im 1/35]
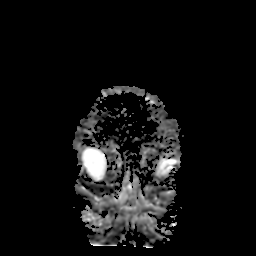

[23 of 48 positions shown; findings below may reference images not displayed]

FINDINGS: MRI HEAD FINDINGS

No evidence for acute infarction, hemorrhage, mass lesion,
hydrocephalus, or extra-axial fluid. Normal for age cerebral volume.
No areas of chronic or lacunar infarction are evident.

Sagittal and axial FLAIR images were obtained. There are fairly
numerous bilateral subcortical supratentorial white matter foci of
T2 and FLAIR hyperintensity without periventricular or callosal
lesions. No brainstem or cerebellar involvement. This pattern is
most consistent with chronic microvascular ischemic change of a mild
degree. Complicated migraine, vasculitis, chronic infection, or
atypical demyelinating process are alternate considerations. Within
limits of visualization on sagittal FLAIR imaging of the upper
cervical region, no cervical cord abnormalities.

Unremarkable pituitary and cerebellar tonsils. Flow voids are
maintained in the bilateral internal carotid, basilar, and bilateral
vertebral arteries, with the left vertebral dominant.

Post infusion, no abnormal enhancement of the brain or meninges. No
osseous lesions. The sinuses, mastoids, and orbits appear
unremarkable. Small nasopharyngeal inclusion cysts do not result in
eustachian tube dysfunction.

MRA HEAD FINDINGS

The internal carotid arteries are widely patent. The basilar artery
is widely patent with left vertebral dominant. There is no
intracranial stenosis or aneurysm. Moderate dolichoectasia could
suggest chronic hypertension.
IMPRESSION: No evidence for acute infarction.

Supratentorial subcortical white matter foci of signal abnormality
most consistent with chronic microvascular ischemic change.
Demyelinating process is not favored. No abnormality on diffusion or
postcontrast imaging to suggest acute process.

Unremarkable MRA of the intracranial circulation except for
dolichoectasia.

## 2015-03-09 ENCOUNTER — Other Ambulatory Visit: Payer: Self-pay | Admitting: Internal Medicine

## 2015-03-09 DIAGNOSIS — E785 Hyperlipidemia, unspecified: Secondary | ICD-10-CM

## 2015-03-23 ENCOUNTER — Other Ambulatory Visit (INDEPENDENT_AMBULATORY_CARE_PROVIDER_SITE_OTHER): Payer: Self-pay

## 2015-03-23 DIAGNOSIS — E785 Hyperlipidemia, unspecified: Secondary | ICD-10-CM

## 2015-03-24 LAB — LIPID PANEL
Chol/HDL Ratio: 5.6 ratio units — ABNORMAL HIGH (ref 0.0–4.4)
Cholesterol, Total: 268 mg/dL — ABNORMAL HIGH (ref 100–199)
HDL: 48 mg/dL (ref >39)
LDL CALC: 185 mg/dL — AB (ref 0–99)
Triglycerides: 176 mg/dL — ABNORMAL HIGH (ref 0–149)
VLDL CHOLESTEROL CAL: 35 mg/dL (ref 5–40)

## 2015-04-30 IMAGING — CR DG FINGER THUMB 2+V*L*
4 series · 4 of 4 positions shown · non-contrast
Comparison: None.

CLINICAL DATA: Left thumb pain for 11 days

EXAM:
LEFT THUMB 2+V

[finger ap]
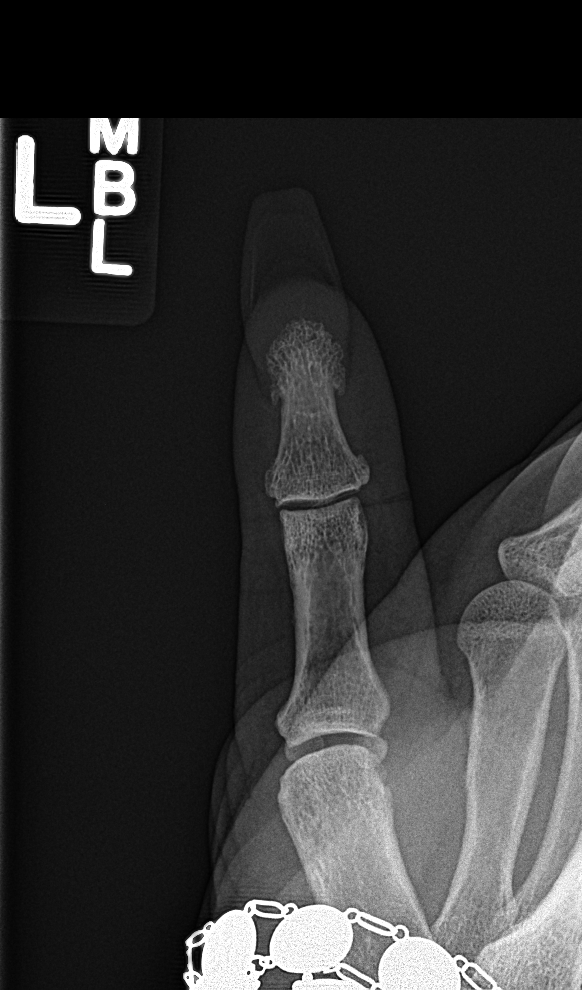

[finger lat (1 of 2)]
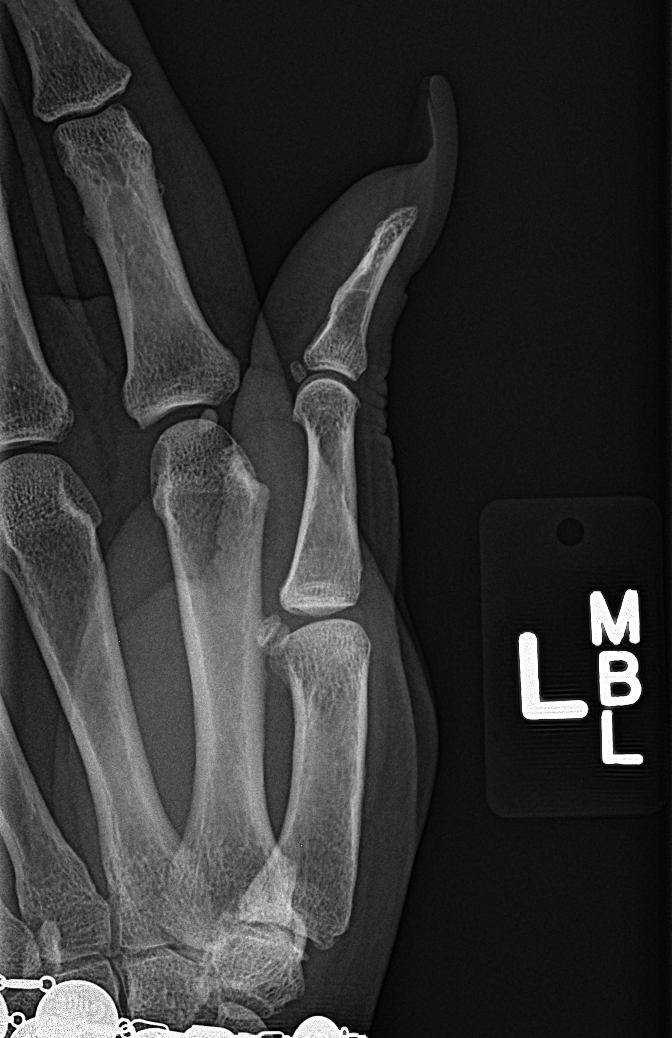

[finger obl]
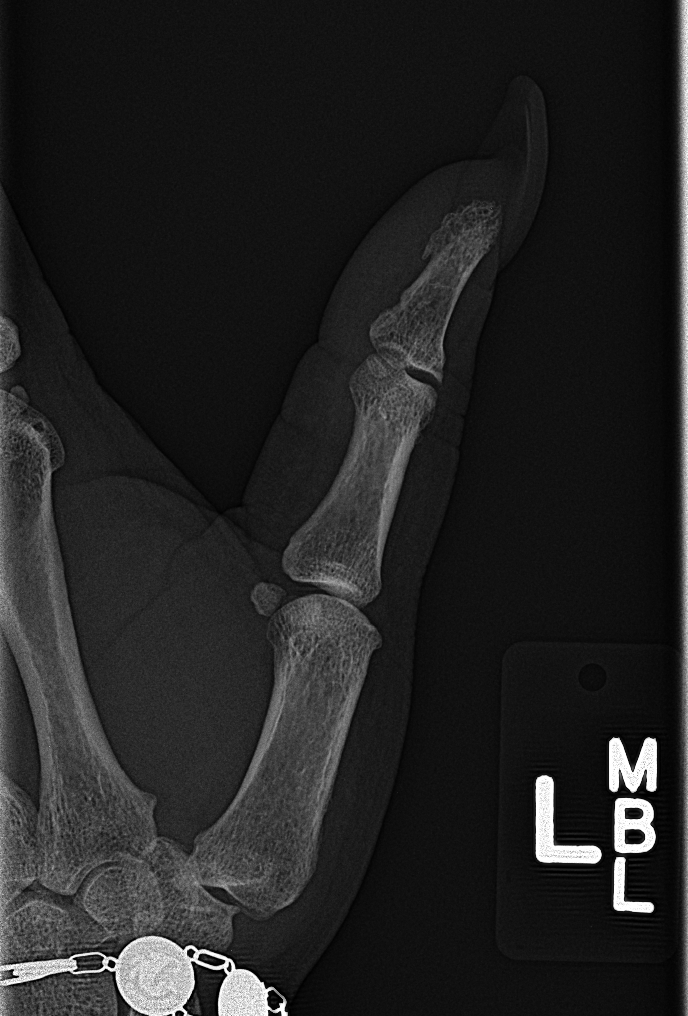

[finger lat (2 of 2)]
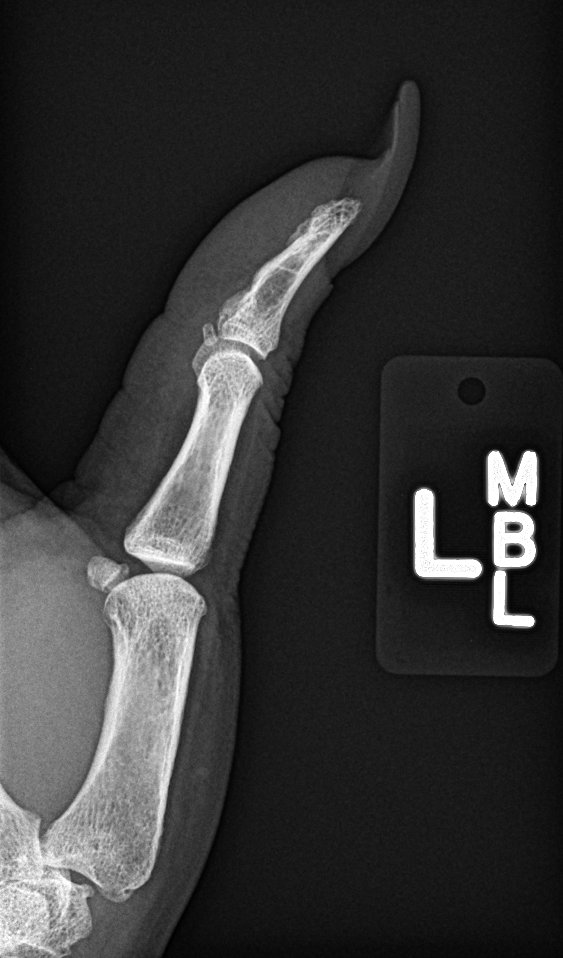

[4 of 4 positions shown; findings below may reference images not displayed]

FINDINGS: No fracture or dislocation of the thumb. No significant arthropathy.
No foreign body.
IMPRESSION: No acute osseous abnormality.

## 2015-06-11 MED FILL — ATORVASTATIN 80 MG TABLET: 80 | 30 days supply | Qty: 30 | Fill #1
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
## 2015-08-11 ENCOUNTER — Encounter: Payer: Self-pay | Admitting: *Deleted

## 2015-08-14 IMAGING — CR DG FINGER THUMB 2+V*R*
3 series · 3 of 3 positions shown · non-contrast
Comparison: None.

CLINICAL DATA: Right thumb pain and swelling.

EXAM:
RIGHT THUMB 2+V

[finger ap]
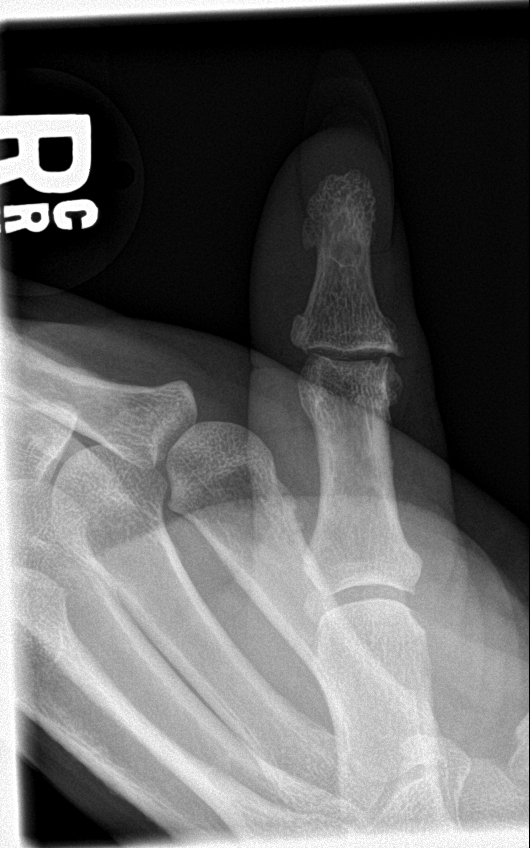

[finger obl]
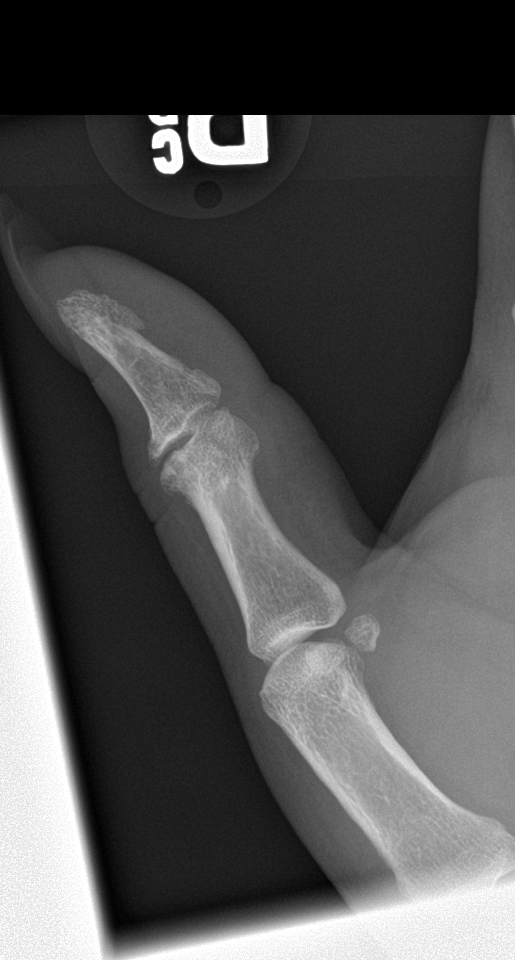

[finger lat]
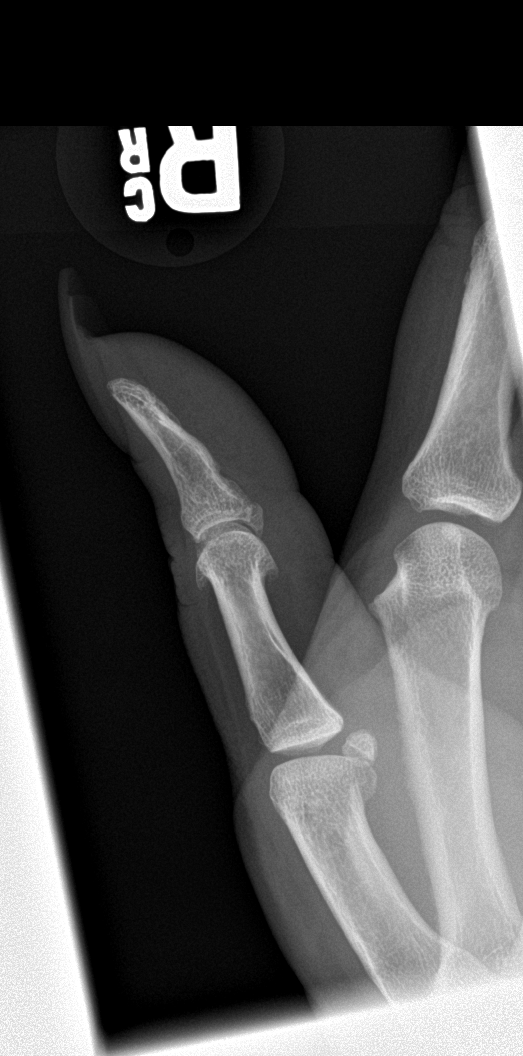

[3 of 3 positions shown; findings below may reference images not displayed]

FINDINGS: No fracture or dislocation. The alignment is maintained. Joint space
narrowing and osteophytes at the interphalangeal joint consistent
with osteoarthritis. No osseous erosions. No focal soft tissue
abnormality.
IMPRESSION: Osteoarthritis of the interphalangeal joint. No acute bony
abnormality.

## 2015-08-30 MED FILL — ATORVASTATIN 80 MG TABLET: 80 | 30 days supply | Qty: 30 | Fill #2

## 2015-10-07 MED FILL — ATORVASTATIN 80 MG TABLET: 80 | 30 days supply | Qty: 30 | Fill #3

## 2015-12-20 ENCOUNTER — Encounter: Payer: Self-pay | Admitting: Internal Medicine

## 2016-10-03 ENCOUNTER — Emergency Department (HOSPITAL_COMMUNITY)
Admission: EM | Admit: 2016-10-03 | Discharge: 2016-10-03 | Disposition: A | Discharge status: Home / Self Care | Payer: Self-pay | Attending: Emergency Medicine | Admitting: Emergency Medicine

## 2016-10-03 ENCOUNTER — Encounter (HOSPITAL_COMMUNITY): Payer: Self-pay

## 2016-10-03 DIAGNOSIS — F17210 Nicotine dependence, cigarettes, uncomplicated: Secondary | ICD-10-CM | POA: Insufficient documentation

## 2016-10-03 DIAGNOSIS — L0291 Cutaneous abscess, unspecified: Secondary | ICD-10-CM

## 2016-10-03 DIAGNOSIS — L0201 Cutaneous abscess of face: Secondary | ICD-10-CM | POA: Insufficient documentation

## 2016-10-03 DIAGNOSIS — Z23 Encounter for immunization: Secondary | ICD-10-CM | POA: Insufficient documentation

## 2016-10-03 LAB — CBG MONITORING, ED: Glucose-Capillary: 86 mg/dL (ref 65–99)

## 2016-10-03 MED ORDER — LIDOCAINE HCL (PF) 1 % IJ SOLN
5.0000 mL | Freq: Once | INTRAMUSCULAR | Status: AC
  Administered 2016-10-03: 5 mL
  Filled 2016-10-03: qty 30

## 2016-10-03 MED ORDER — CLINDAMYCIN HCL 300 MG PO CAPS: 300.0000 mg | ORAL_CAPSULE | Freq: Three times a day (TID) | ORAL | 0 refills | Status: DC

## 2016-10-03 MED ORDER — TETANUS-DIPHTH-ACELL PERTUSSIS 5-2.5-18.5 LF-MCG/0.5 IM SUSP
0.5000 mL | Freq: Once | INTRAMUSCULAR | Status: AC
  Administered 2016-10-03: 0.5 mL via INTRAMUSCULAR
  Filled 2016-10-03: qty 0.5

## 2016-10-03 NOTE — ED Triage Notes (Deleted)
Pt complains of sunburn on his back for two days ans it itches really bad, he's tried benedryl with no relief Pt also complains of a laceration on his left ankle that will not heal

## 2016-10-03 NOTE — ED Provider Notes (Signed)
[facility] DEPT Provider Note   CSN: [id number] Arrival date & time: 10/03/16  1927     History   Chief Complaint Chief Complaint  Patient presents with  . Abscess    HPI Betty Archer is a 56 y.o. female who presents to the ED with an abscess. Patient reports she first noted the pain and swelling a couple days ago followed by a bad smell and and a sore in her left nostril. The area of the abscess is on the left side of the face just under her eye and beside her nose. She also complains of a sty on her left eye lid and swelling of her eye.  The history is provided by the patient. No language interpreter was used.  Abscess  Location:  Face Facial abscess location:  L cheek and nose Size:  2 cm Abscess quality: painful, redness and warmth   Red streaking: no   Duration:  3 days Progression:  Worsening Pain details:    Quality:  Throbbing   Severity:  Moderate   Timing:  Constant   Progression:  Worsening Chronicity:  New Context comment:  Pre diabetes Relieved by:  Nothing Ineffective treatments:  None tried Associated symptoms: no fever, no headaches, no nausea and no vomiting     Past Medical History:  Diagnosis Date  . Hyperlipidemia     Patient Active Problem List   Diagnosis Date Noted  . Microcytosis 10/26/2014  . Vitamin D deficiency 10/17/2014  . Stenosing tenosynovitis of thumb 10/17/2014  . Internal hemorrhoids 10/17/2014  . History of colon polyps 10/17/2014  . Osteoarthritis of carpometacarpal joints of both thumbs 04/04/2014  . Pre-diabetes 08/18/2013  . History of TIA (transient ischemic attack) 08/12/2013  . Routine health maintenance 08/15/2010  . Hyperlipidemia 08/05/2008  . Obesity 07/30/2007  . TOBACCO DEPENDENCE 04/19/2006    Past Surgical History:  Procedure Laterality Date  . BREAST BIOPSY      Core biopsy done on April 2003  . COLONOSCOPY    . PARTIAL HYSTERECTOMY   10/22/1999    Still has cervix  . TRIGGER FINGER RELEASE Left  12/25/2013   Procedure: LEFT THUMB TRIGGER RELEASE ;  Surgeon: Leanora Cover, MD;  Location: [facility];  Service: Orthopedics;  Laterality: Left;    OB History    No data available       Home Medications    Prior to Admission medications   Medication Sig Start Date End Date Taking? Authorizing Provider  clindamycin (CLEOCIN) 300 MG capsule Take 1 capsule (300 mg total) by mouth 3 (three) times daily. 10/03/16   Ashley Murrain, NP  Vitamin D, Ergocalciferol, (DRISDOL) 50000 UNITS CAPS capsule Take 1 capsule (50,000 Units total) by mouth every 7 (seven) days. Patient not taking: Reported on 10/03/2016 10/17/14   Juluis Mire, MD    Family History Family History  Problem Relation Age of Onset  . Breast cancer Maternal Grandmother   . Breast cancer Maternal Aunt   . Hypertension Mother   . Lung cancer Father   . Colon cancer Neg Hx   . Esophageal cancer Neg Hx   . Stomach cancer Neg Hx     Social History Social History  Substance Use Topics  . Smoking status: Current Every Day Smoker    Packs/day: 0.50    Years: 27.00    Types: Cigarettes  . Smokeless tobacco: Never Used  . Alcohol use 0.0 oz/week     Comment:  patient is a  social drinker     Allergies   Patient has no known allergies.   Review of Systems Review of Systems  Constitutional: Negative for chills and fever.  HENT: Positive for facial swelling and sinus pressure.   Eyes: Positive for pain and redness.  Respiratory: Negative for shortness of breath.   Cardiovascular: Negative for chest pain.  Gastrointestinal: Negative for abdominal pain, nausea and vomiting.  Musculoskeletal: Positive for myalgias.  Skin: Positive for wound.  Neurological: Negative for syncope and headaches.  Psychiatric/Behavioral: Negative for confusion.     Physical Exam Updated Vital Signs BP 123/84 (BP Location: Left Arm)   Pulse 88   Temp 98 F (36.7 C) (Oral)   Resp 20   SpO2 100%   Physical Exam    Constitutional: She is oriented to person, place, and time. She appears well-developed and well-nourished. No distress.  HENT:  Head:    There is a 2 cm area to the left side of the face that is tender, raised, with erythema.   Eyes: Conjunctivae and EOM are normal. Left eye exhibits hordeolum.    Neck: Neck supple.  Cardiovascular: Normal rate.   Pulmonary/Chest: Effort normal.  Musculoskeletal: Normal range of motion.  Neurological: She is alert and oriented to person, place, and time. No cranial nerve deficit.  Skin: Skin is warm and dry.  Psychiatric: She has a normal mood and affect. Her behavior is normal.  Nursing note and vitals reviewed.    ED Treatments / Results  Labs (all labs ordered are listed, but only abnormal results are displayed) Labs Reviewed  CBG MONITORING, ED     Radiology No results found.  Procedures .Marland KitchenIncision and Drainage Date/Time: 10/03/2016 11:09 PM Performed by: Ashley Murrain Authorized by: Ashley Murrain   Consent:    Consent obtained:  Verbal   Consent given by:  Patient   Risks discussed:  Bleeding and incomplete drainage   Alternatives discussed:  No treatment and alternative treatment Location:    Type:  Abscess   Size:  2 cm   Location:  Head   Head location:  Face Pre-procedure details:    Skin preparation:  Antiseptic wash Anesthesia (see MAR for exact dosages):    Anesthesia method:  Local infiltration   Local anesthetic:  Lidocaine 1% w/o epi Procedure type:    Complexity:  Complex Procedure details:    Needle aspiration: no     Incision types:  Single straight   Incision depth:  Dermal   Scalpel blade:  11   Wound management:  Probed and deloculated   Drainage:  Purulent   Drainage amount:  Moderate   Wound treatment:  Wound left open   Packing materials:  None Post-procedure details:    Patient tolerance of procedure:  Tolerated well, no immediate complications Comments:     Tetanus updated.   (including  critical care time)  Medications Ordered in ED Medications  lidocaine (PF) (XYLOCAINE) 1 % injection 5 mL (not administered)  Tdap (BOOSTRIX) injection 0.5 mL (not administered)     Initial Impression / Assessment and Plan / ED Course  I have reviewed the triage vital signs and the nursing notes.   Final Clinical Impressions(s) / ED Diagnoses  Patient with skin abscess amenable to incision and drainage.  Abscess was not large enough to warrant packing or drain,  wound recheck in 2 days. Encouraged home warm soaks and flushing.  Mild signs of cellulitis is surrounding skin swelling of left orbit.  Will  d/c to home.  Antibiotic therapy is indicated. Final diagnoses:  Abscess    New Prescriptions New Prescriptions   CLINDAMYCIN (CLEOCIN) 300 MG CAPSULE    Take 1 capsule (300 mg total) by mouth 3 (three) times daily.     Debroah Baller [place], [place] 10/03/16 2312    Gareth Morgan, MD 10/04/16 [phone]

## 2016-10-03 NOTE — Discharge Instructions (Signed)
Apply warm wet compresses to the area several times a day. Return immediately for fever, increased swelling, eye pain or change in vision or any other worsening symptoms.

## 2016-10-03 NOTE — ED Notes (Signed)
Pt from home with c/o abscess with pain and malodorous drainage that began yesterday. Pt is not febrile nor tachycardic

## 2018-02-27 ENCOUNTER — Emergency Department (HOSPITAL_COMMUNITY)
Admission: EM | Admit: 2018-02-27 | Discharge: 2018-02-28 | Disposition: A | Discharge status: Home / Self Care | Payer: Self-pay | Attending: Emergency Medicine | Admitting: Emergency Medicine

## 2018-02-27 ENCOUNTER — Other Ambulatory Visit: Payer: Self-pay

## 2018-02-27 ENCOUNTER — Emergency Department (HOSPITAL_COMMUNITY): Payer: Self-pay

## 2018-02-27 ENCOUNTER — Encounter (HOSPITAL_COMMUNITY): Payer: Self-pay | Admitting: Emergency Medicine

## 2018-02-27 DIAGNOSIS — R072 Precordial pain: Secondary | ICD-10-CM | POA: Insufficient documentation

## 2018-02-27 DIAGNOSIS — F17210 Nicotine dependence, cigarettes, uncomplicated: Secondary | ICD-10-CM | POA: Insufficient documentation

## 2018-02-27 LAB — I-STAT BETA HCG BLOOD, ED (NOT ORDERABLE): I-stat hCG, quantitative: <5 m[IU]/mL (ref <5)

## 2018-02-27 LAB — CBC
HCT: 41.1 % (ref 36.0–46.0)
Hemoglobin: 12.5 g/dL (ref 12.0–15.0)
MCH: 24.1 pg — AB (ref 26.0–34.0)
MCHC: 30.4 g/dL (ref 30.0–36.0)
MCV: 79.3 fL — AB (ref 80.0–100.0)
PLATELETS: 236 10*3/uL (ref 150–400)
RBC: 5.18 MIL/uL — AB (ref 3.87–5.11)
RDW: 15.3 % (ref 11.5–15.5)
WBC: 7.9 10*3/uL (ref 4.0–10.5)
nRBC: 0 % (ref 0.0–0.2)

## 2018-02-27 LAB — BASIC METABOLIC PANEL
Anion gap: 9 (ref 5–15)
BUN: 12 mg/dL (ref 6–20)
CO2: 23 mmol/L (ref 22–32)
CREATININE: 0.94 mg/dL (ref 0.44–1.00)
Calcium: 9.2 mg/dL (ref 8.9–10.3)
Chloride: 110 mmol/L (ref 98–111)
GFR calc Af Amer: >60 mL/min (ref >60)
GFR calc non Af Amer: >60 mL/min (ref >60)
GLUCOSE: 96 mg/dL (ref 70–99)
Potassium: 3.7 mmol/L (ref 3.5–5.1)
Sodium: 142 mmol/L (ref 135–145)

## 2018-02-27 LAB — POCT I-STAT TROPONIN I: Troponin i, poc: 0 ng/mL (ref 0.00–0.08)

## 2018-02-27 MED ORDER — ALUM & MAG HYDROXIDE-SIMETH 200-200-20 MG/5ML PO SUSP
30.0000 mL | Freq: Once | ORAL | Status: AC
  Administered 2018-02-27: 30 mL via ORAL
  Filled 2018-02-27: qty 30

## 2018-02-27 NOTE — ED Triage Notes (Signed)
Pt reports chest pain x1 week mid chest nonradiating denies SOB, N/V skin warm dry denies Hx cardiac

## 2018-02-27 NOTE — ED Provider Notes (Signed)
[facility] DEPT Provider Note   CSN: [id number] Arrival date & time: 02/27/18  2031     History   Chief Complaint Chief Complaint  Patient presents with  . Chest Pain    HPI Betty Archer is a 58 y.o. female.  The history is provided by the patient.  Chest Pain  Pain location:  L lateral chest Pain quality: dull   Pain radiates to:  Does not radiate Pain severity:  Moderate Onset quality:  Gradual Duration:  3 weeks Progression:  Waxing and waning Chronicity:  Chronic Context: not breathing   Context comment:  Lying flat at night, associated with gas Relieved by:  Nothing Worsened by:  Nothing Ineffective treatments:  None tried Associated symptoms: no abdominal pain, no AICD problem, no altered mental status, no anorexia, no anxiety, no back pain, no claudication, no cough, no diaphoresis, no dizziness, no dysphagia, no fatigue, no fever, no headache, no heartburn, no lower extremity edema, no nausea, no near-syncope, no numbness, no orthopnea, no palpitations, no PND, no shortness of breath, no syncope, no vomiting and no weakness   Risk factors: not female and no surgery   States it starts lying flat at 10 pm nightly.  No DOE, no SOB.  No n/v/d.  No travel no leg pain or swelling no cough no surgery.    Past Medical History:  Diagnosis Date  . Hyperlipidemia     Patient Active Problem List   Diagnosis Date Noted  . Microcytosis 10/26/2014  . Vitamin D deficiency 10/17/2014  . Stenosing tenosynovitis of thumb 10/17/2014  . Internal hemorrhoids 10/17/2014  . History of colon polyps 10/17/2014  . Osteoarthritis of carpometacarpal joints of both thumbs 04/04/2014  . Pre-diabetes 08/18/2013  . History of TIA (transient ischemic attack) 08/12/2013  . Routine health maintenance 08/15/2010  . Hyperlipidemia 08/05/2008  . Obesity 07/30/2007  . TOBACCO DEPENDENCE 04/19/2006    Past Surgical History:  Procedure Laterality Date  .  BREAST BIOPSY      Core biopsy done on [facility] 2003  . COLONOSCOPY    . PARTIAL HYSTERECTOMY   10/22/1999    Still has cervix  . TRIGGER FINGER RELEASE Left 12/25/2013   Procedure: LEFT THUMB TRIGGER RELEASE ;  Surgeon: Leanora Cover, MD;  Location: [facility];  Service: Orthopedics;  Laterality: Left;     OB History   No obstetric history on file.      Home Medications    Prior to Admission medications   Medication Sig Start Date End Date Taking? Authorizing Provider  clindamycin (CLEOCIN) 300 MG capsule Take 1 capsule (300 mg total) by mouth 3 (three) times daily. Patient not taking: Reported on 02/27/2018 10/03/16   Ashley Murrain, NP  Vitamin D, Ergocalciferol, (DRISDOL) 50000 UNITS CAPS capsule Take 1 capsule (50,000 Units total) by mouth every 7 (seven) days. Patient not taking: Reported on 10/03/2016 10/17/14   Juluis Mire, MD    Family History Family History  Problem Relation Age of Onset  . Breast cancer Maternal Grandmother   . Breast cancer Maternal Aunt   . Hypertension Mother   . Lung cancer Father   . Colon cancer Neg Hx   . Esophageal cancer Neg Hx   . Stomach cancer Neg Hx     Social History Social History   Tobacco Use  . Smoking status: Current Every Day Smoker    Packs/day: 0.50    Years: 27.00    Pack years: 13.50  Types: Cigarettes  . Smokeless tobacco: Never Used  Substance Use Topics  . Alcohol use: Yes    Alcohol/week: 0.0 standard drinks    Comment:  patient is a social drinker  . Drug use: No     Allergies   Patient has no known allergies.   Review of Systems Review of Systems  Constitutional: Negative for diaphoresis, fatigue and fever.  HENT: Negative for trouble swallowing.   Respiratory: Negative for cough, shortness of breath and wheezing.   Cardiovascular: Positive for chest pain. Negative for palpitations, orthopnea, claudication, leg swelling, syncope, PND and near-syncope.  Gastrointestinal: Negative for  abdominal pain, anorexia, heartburn, nausea and vomiting.  Musculoskeletal: Negative for back pain and neck pain.  Neurological: Negative for dizziness, weakness, numbness and headaches.  All other systems reviewed and are negative.    Physical Exam Updated Vital Signs BP 130/74   Pulse (!) 55   Temp 98.8 F (37.1 C) (Oral)   Resp (!) 21   SpO2 99%   Physical Exam Vitals signs and nursing note reviewed.  Constitutional:      General: She is not in acute distress.    Appearance: She is well-developed. She is not ill-appearing or diaphoretic.  HENT:     Head: Normocephalic and atraumatic.     Nose: Nose normal.     Mouth/Throat:     Mouth: Mucous membranes are moist.     Pharynx: Oropharynx is clear.  Eyes:     Extraocular Movements: Extraocular movements intact.     Pupils: Pupils are equal, round, and reactive to light.  Neck:     Musculoskeletal: Normal range of motion and neck supple.  Cardiovascular:     Rate and Rhythm: Normal rate and regular rhythm.  Pulmonary:     Effort: Pulmonary effort is normal. No respiratory distress.     Breath sounds: Normal breath sounds. No rales.  Abdominal:     General: Abdomen is flat. Bowel sounds are increased.     Tenderness: There is no abdominal tenderness. Negative signs include Murphy's sign and McBurney's sign.  Musculoskeletal: Normal range of motion.        General: No tenderness.  Skin:    General: Skin is warm and dry.     Capillary Refill: Capillary refill takes less than 2 seconds.  Neurological:     General: No focal deficit present.     Mental Status: She is alert.  Psychiatric:        Mood and Affect: Mood normal.        Behavior: Behavior normal.      ED Treatments / Results  Labs (all labs ordered are listed, but only abnormal results are displayed) Results for orders placed or performed during the hospital encounter of 02/40/97  Basic metabolic panel  Result Value Ref Range   Sodium 142 135 - 145  mmol/L   Potassium 3.7 3.5 - 5.1 mmol/L   Chloride 110 98 - 111 mmol/L   CO2 23 22 - 32 mmol/L   Glucose, Bld 96 70 - 99 mg/dL   BUN 12 6 - 20 mg/dL   Creatinine, Ser 0.94 0.44 - 1.00 mg/dL   Calcium 9.2 8.9 - 10.3 mg/dL   GFR calc non Af Amer >60 >60 mL/min   GFR calc Af Amer >60 >60 mL/min   Anion gap 9 5 - 15  CBC  Result Value Ref Range   WBC 7.9 4.0 - 10.5 K/uL   RBC 5.18 (H) 3.87 -  5.11 MIL/uL   Hemoglobin 12.5 12.0 - 15.0 g/dL   HCT 41.1 36.0 - 46.0 %   MCV 79.3 (L) 80.0 - 100.0 fL   MCH 24.1 (L) 26.0 - 34.0 pg   MCHC 30.4 30.0 - 36.0 g/dL   RDW 15.3 11.5 - 15.5 %   Platelets 236 150 - 400 K/uL   nRBC 0.0 0.0 - 0.2 %  I-Stat beta hCG blood, ED  Result Value Ref Range   I-stat hCG, quantitative <5.0 <5 mIU/mL   Comment 3          POCT i-Stat troponin I  Result Value Ref Range   Troponin i, poc 0.00 0.00 - 0.08 ng/mL   Comment 3           Dg Chest 2 View  Result Date: 02/27/2018 CLINICAL DATA:  Chest pain EXAM: CHEST - 2 VIEW COMPARISON:  02/07/2014 FINDINGS: The heart size and mediastinal contours are within normal limits. Both lungs are clear. The visualized skeletal structures are unremarkable. IMPRESSION: No active cardiopulmonary disease. Electronically Signed   By: Donavan Foil M.D.   On: 02/27/2018 21:17    EKG EKG Interpretation  Date/Time:  Wednesday February 27 2018 21:22:31 EST Ventricular Rate:  57 PR Interval:    QRS Duration: 87 QT Interval:  438 QTC Calculation: 427 R Axis:   78 Text Interpretation:  Sinus rhythm Confirmed by Dory Horn) on 02/27/2018 11:08:28 PM   Radiology Dg Chest 2 View  Result Date: 02/27/2018 CLINICAL DATA:  Chest pain EXAM: CHEST - 2 VIEW COMPARISON:  02/07/2014 FINDINGS: The heart size and mediastinal contours are within normal limits. Both lungs are clear. The visualized skeletal structures are unremarkable. IMPRESSION: No active cardiopulmonary disease. Electronically Signed   By: Donavan Foil M.D.   On:  02/27/2018 21:17    Procedures Procedures (including critical care time)  Medications Ordered in ED Medications  alum & mag hydroxide-simeth (MAALOX/MYLANTA) 200-200-20 MG/5ML suspension 30 mL (has no administration in time range)       Final Clinical Impressions(s) / ED Diagnoses   Ruled out for MI in the ED with 2 negative troponines, symptoms are highly atypical for ACS.  There is no exertional component.  Symptoms are most consistent with GERD, will treat for same and refer to PMD for outpatient stress. HEART score is 1 and patient is a low risk for MAE.   Return for exertional pain or shortness of breath, nausea, diaphoresis, leg pain or swelling, fevers >100.4 unrelieved by medication, shortness of breath, intractable vomiting, or diarrhea, abdominal pain, Inability to tolerate liquids or food, cough, altered mental status or any concerns. No signs of systemic illness or infection. The patient is nontoxic-appearing on exam and vital signs are within normal limits.   I have reviewed the triage vital signs and the nursing notes. Pertinent labs &imaging results that were available during my care of the patient were reviewed by me and considered in my medical decision making (see chart for details).  After history, exam, and medical workup I feel the patient has been appropriately medically screened and is safe for discharge home. Pertinent diagnoses were discussed with the patient. Patient was given return precautions.   Matalie Romberger, MD 02/28/18 1443

## 2018-02-28 LAB — POCT I-STAT TROPONIN I: Troponin i, poc: 0 ng/mL (ref 0.00–0.08)

## 2018-02-28 MED ORDER — OMEPRAZOLE 20 MG PO CPDR: 20.0000 mg | DELAYED_RELEASE_CAPSULE | Freq: Every day | ORAL | 0 refills | Status: DC

## 2019-08-10 IMAGING — CR DG CHEST 2V
2 series · 2 of 2 positions shown · non-contrast
Comparison: 02/07/2014

CLINICAL DATA: Chest pain

EXAM:
CHEST - 2 VIEW

[w chest pa]
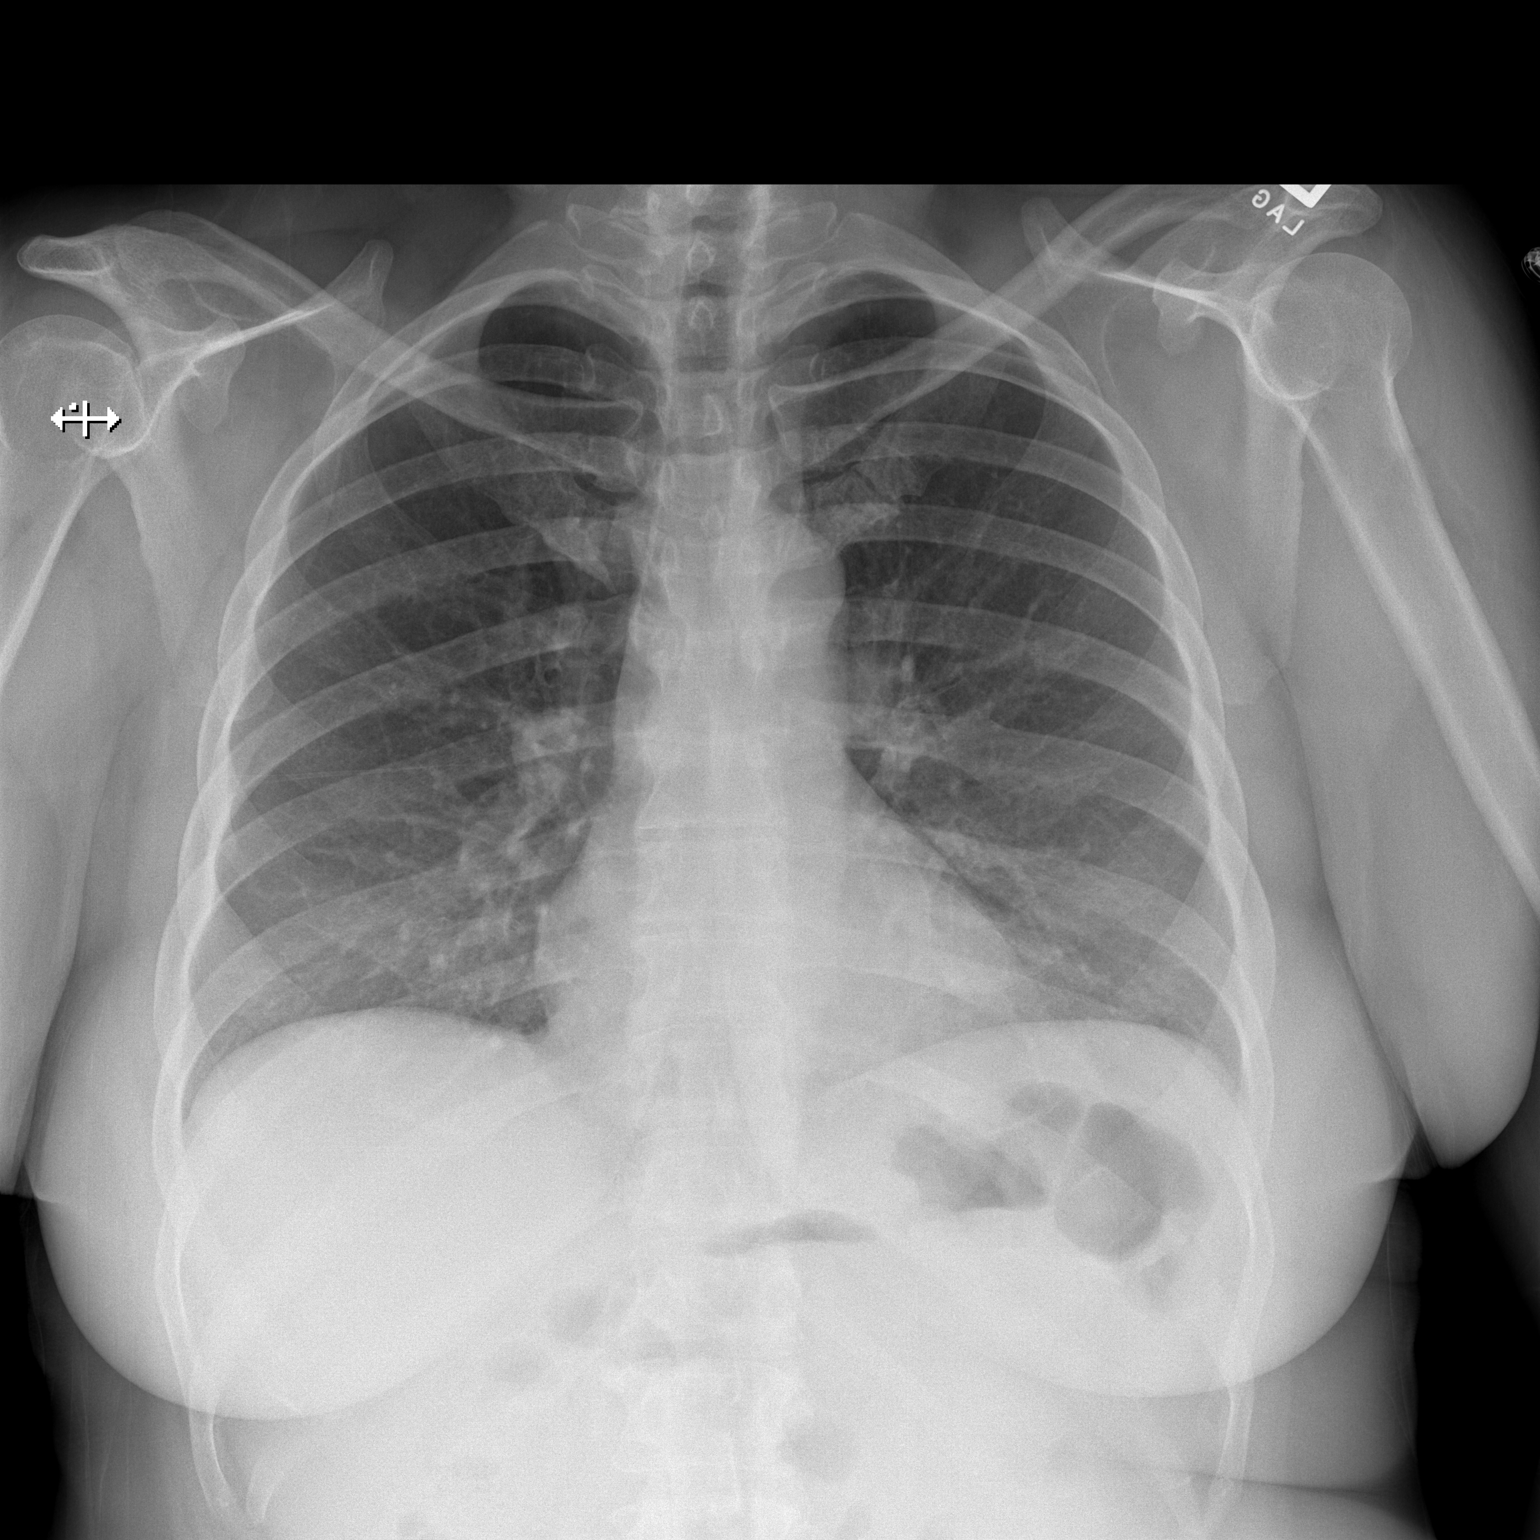

[w chest lat]
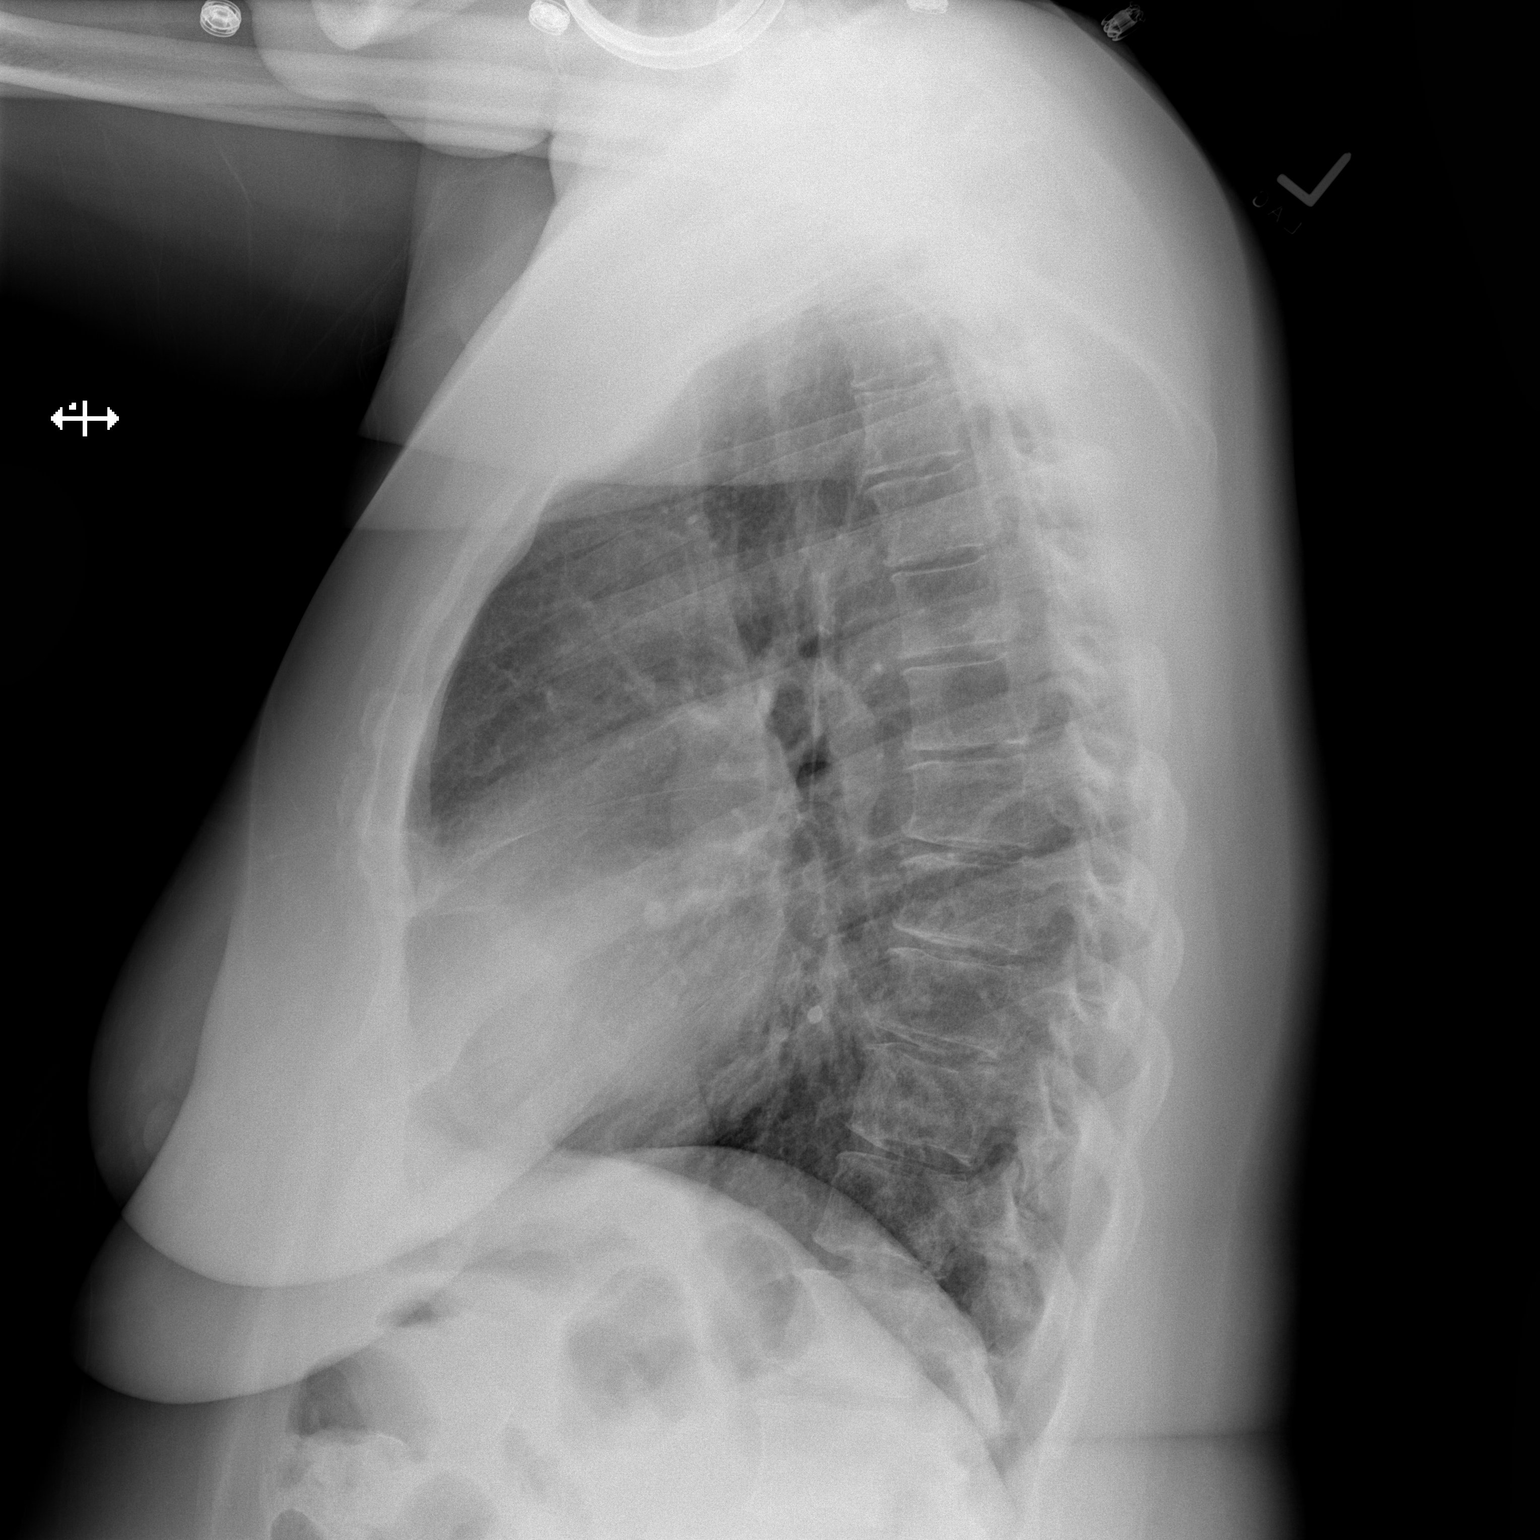

[2 of 2 positions shown; findings below may reference images not displayed]

FINDINGS: The heart size and mediastinal contours are within normal limits.
Both lungs are clear. The visualized skeletal structures are
unremarkable.
IMPRESSION: No active cardiopulmonary disease.

## 2020-08-08 ENCOUNTER — Emergency Department (HOSPITAL_COMMUNITY): Payer: No Typology Code available for payment source

## 2020-08-08 ENCOUNTER — Encounter (HOSPITAL_COMMUNITY): Payer: Self-pay | Admitting: Emergency Medicine

## 2020-08-08 ENCOUNTER — Emergency Department (HOSPITAL_COMMUNITY)
Admission: EM | Admit: 2020-08-08 | Discharge: 2020-08-08 | Disposition: A | Discharge status: Home / Self Care | Payer: No Typology Code available for payment source | Attending: Emergency Medicine | Admitting: Emergency Medicine

## 2020-08-08 ENCOUNTER — Ambulatory Visit (HOSPITAL_COMMUNITY)
Admission: EM | Admit: 2020-08-08 | Discharge: 2020-08-08 | Disposition: A | Discharge status: Home / Self Care | Payer: Self-pay

## 2020-08-08 ENCOUNTER — Encounter (HOSPITAL_COMMUNITY): Payer: Self-pay

## 2020-08-08 ENCOUNTER — Other Ambulatory Visit: Payer: Self-pay

## 2020-08-08 DIAGNOSIS — F17210 Nicotine dependence, cigarettes, uncomplicated: Secondary | ICD-10-CM | POA: Diagnosis not present

## 2020-08-08 DIAGNOSIS — R1030 Lower abdominal pain, unspecified: Secondary | ICD-10-CM | POA: Diagnosis not present

## 2020-08-08 DIAGNOSIS — R202 Paresthesia of skin: Secondary | ICD-10-CM | POA: Insufficient documentation

## 2020-08-08 DIAGNOSIS — M542 Cervicalgia: Secondary | ICD-10-CM

## 2020-08-08 DIAGNOSIS — M545 Low back pain, unspecified: Secondary | ICD-10-CM | POA: Insufficient documentation

## 2020-08-08 DIAGNOSIS — Y92410 Unspecified street and highway as the place of occurrence of the external cause: Secondary | ICD-10-CM | POA: Diagnosis not present

## 2020-08-08 DIAGNOSIS — R519 Headache, unspecified: Secondary | ICD-10-CM | POA: Diagnosis present

## 2020-08-08 DIAGNOSIS — I671 Cerebral aneurysm, nonruptured: Secondary | ICD-10-CM | POA: Diagnosis not present

## 2020-08-08 DIAGNOSIS — R200 Anesthesia of skin: Secondary | ICD-10-CM

## 2020-08-08 LAB — CBC WITH DIFFERENTIAL/PLATELET
Abs Immature Granulocytes: 0.01 10*3/uL (ref 0.00–0.07)
Basophils Absolute: 0 10*3/uL (ref 0.0–0.1)
Basophils Relative: 1 %
Eosinophils Absolute: 0 10*3/uL (ref 0.0–0.5)
Eosinophils Relative: 1 %
HCT: 39.6 % (ref 36.0–46.0)
Hemoglobin: 12.4 g/dL (ref 12.0–15.0)
Immature Granulocytes: 0 %
Lymphocytes Relative: 62 %
Lymphs Abs: 2.6 10*3/uL (ref 0.7–4.0)
MCH: 24.3 pg — ABNORMAL LOW (ref 26.0–34.0)
MCHC: 31.3 g/dL (ref 30.0–36.0)
MCV: 77.6 fL — ABNORMAL LOW (ref 80.0–100.0)
Monocytes Absolute: 0.4 10*3/uL (ref 0.1–1.0)
Monocytes Relative: 10 %
Neutro Abs: 1 10*3/uL — ABNORMAL LOW (ref 1.7–7.7)
Neutrophils Relative %: 26 %
Platelets: 147 10*3/uL — ABNORMAL LOW (ref 150–400)
RBC: 5.1 MIL/uL (ref 3.87–5.11)
RDW: 15.1 % (ref 11.5–15.5)
WBC: 4.1 10*3/uL (ref 4.0–10.5)
nRBC: 0 % (ref 0.0–0.2)

## 2020-08-08 LAB — BASIC METABOLIC PANEL
Anion gap: 6 (ref 5–15)
BUN: 8 mg/dL (ref 6–20)
CO2: 24 mmol/L (ref 22–32)
Calcium: 8.6 mg/dL — ABNORMAL LOW (ref 8.9–10.3)
Chloride: 108 mmol/L (ref 98–111)
Creatinine, Ser: 0.76 mg/dL (ref 0.44–1.00)
GFR, Estimated: >60 mL/min (ref >60)
Glucose, Bld: 83 mg/dL (ref 70–99)
Potassium: 3.8 mmol/L (ref 3.5–5.1)
Sodium: 138 mmol/L (ref 135–145)

## 2020-08-08 MED ORDER — IOHEXOL 350 MG/ML SOLN
51.0000 mL | Freq: Once | INTRAVENOUS | Status: AC | PRN
  Administered 2020-08-08: 51 mL via INTRAVENOUS

## 2020-08-08 MED ORDER — OXYCODONE HCL 5 MG PO TABS: 5.0000 mg | ORAL_TABLET | Freq: Four times a day (QID) | ORAL | 0 refills | Status: DC | PRN

## 2020-08-08 MED ORDER — OXYCODONE-ACETAMINOPHEN 5-325 MG PO TABS
1.0000 | ORAL_TABLET | Freq: Once | ORAL | Status: AC
  Administered 2020-08-08: 1 via ORAL
  Filled 2020-08-08: qty 1
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
## 2020-08-08 NOTE — ED Notes (Signed)
Pt on CT.
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
## 2020-08-08 NOTE — Discharge Instructions (Addendum)
You will be called by neuro interventional radiology regarding a clinic appointment to discuss treatment of your aneurysms.

## 2020-08-08 NOTE — Discharge Instructions (Addendum)
GO TO ER °

## 2020-08-09 ENCOUNTER — Other Ambulatory Visit (HOSPITAL_COMMUNITY): Payer: Self-pay | Admitting: Neuroradiology

## 2020-08-09 DIAGNOSIS — I671 Cerebral aneurysm, nonruptured: Secondary | ICD-10-CM

## 2020-08-10 ENCOUNTER — Ambulatory Visit (HOSPITAL_COMMUNITY)
Admission: RE | Admit: 2020-08-10 | Discharge: 2020-08-10 | Disposition: A | Discharge status: Home / Self Care | Payer: Self-pay | Source: Ambulatory Visit | Attending: Neuroradiology | Admitting: Neuroradiology

## 2020-08-10 ENCOUNTER — Other Ambulatory Visit: Payer: Self-pay

## 2020-08-10 DIAGNOSIS — I671 Cerebral aneurysm, nonruptured: Secondary | ICD-10-CM
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
## 2020-08-18 ENCOUNTER — Telehealth (HOSPITAL_COMMUNITY): Payer: Self-pay | Admitting: Radiology

## 2020-08-18 ENCOUNTER — Other Ambulatory Visit (HOSPITAL_COMMUNITY): Payer: Self-pay | Admitting: Neuroradiology

## 2020-08-18 DIAGNOSIS — D446 Neoplasm of uncertain behavior of carotid body: Secondary | ICD-10-CM

## 2020-08-18 DIAGNOSIS — I671 Cerebral aneurysm, nonruptured: Secondary | ICD-10-CM

## 2020-08-18 NOTE — Telephone Encounter (Signed)
Called pt on both her cell number and her work number. No answer and no VM on cell phone. Work number provided is not this patient's number. Trying to schedule for her brain aneurysm treatment with Dr. Debbrah Alar. JM

## 2020-08-20 ENCOUNTER — Other Ambulatory Visit: Payer: Self-pay | Admitting: Radiology

## 2020-08-20 ENCOUNTER — Other Ambulatory Visit: Payer: Self-pay | Admitting: Student

## 2020-08-20 DIAGNOSIS — Z8673 Personal history of transient ischemic attack (TIA), and cerebral infarction without residual deficits: Secondary | ICD-10-CM

## 2020-08-20 MED ORDER — TICAGRELOR 60 MG PO TABS: 90.0000 mg | ORAL_TABLET | Freq: Once | ORAL | Status: DC

## 2020-08-20 MED ORDER — ASPIRIN 81 MG PO CHEW: 81.0000 mg | CHEWABLE_TABLET | Freq: Once | ORAL | Status: DC

## 2020-08-20 NOTE — Progress Notes (Signed)
Left message on pt's home phone asking her to call PAT office in regards to her procedure on 08/25/20. Also called pt's cell phone;no voicemail available. Work number is not patient's number.

## 2020-08-22 ENCOUNTER — Other Ambulatory Visit: Payer: Self-pay

## 2020-08-22 ENCOUNTER — Encounter (HOSPITAL_COMMUNITY): Payer: Self-pay | Admitting: *Deleted

## 2020-08-22 NOTE — Progress Notes (Addendum)
SDW CALL  Patient was given pre-op instructions over the phone. The opportunity was given for the patient to ask questions. No further questions asked. Patient verbalized understanding of instructions given.   PCP - Juluis Mire MD Cardiologist - denies  Chest x-ray - not needed EKG - not needed Stress Test - denies ECHO - 2015 Cardiac Cath - denies   Blood Thinner Instructions: continue birlinta Aspirin Instructions:continue  COVID TEST- 08/24/20   Anesthesia review: Yes, per order  Patient denies shortness of breath, fever, cough and chest pain over the phone call   All instructions explained to the patient, with a verbal understanding of the material. Patient agrees to go over the instructions while at home for a better understanding. Patient also instructed to self quarantine after being tested for COVID-19. The opportunity to ask questions was provided.

## 2020-08-24 ENCOUNTER — Other Ambulatory Visit: Payer: Self-pay | Admitting: Radiology

## 2020-08-24 ENCOUNTER — Other Ambulatory Visit: Payer: Self-pay | Admitting: Student

## 2020-08-24 ENCOUNTER — Other Ambulatory Visit (HOSPITAL_COMMUNITY)
Admission: RE | Admit: 2020-08-24 | Discharge: 2020-08-24 | Disposition: A | Discharge status: Home / Self Care | Payer: Self-pay | Source: Ambulatory Visit | Attending: Neuroradiology | Admitting: Neuroradiology

## 2020-08-24 DIAGNOSIS — Z01812 Encounter for preprocedural laboratory examination: Secondary | ICD-10-CM | POA: Insufficient documentation

## 2020-08-24 DIAGNOSIS — U071 COVID-19: Secondary | ICD-10-CM | POA: Insufficient documentation
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
## 2020-08-24 NOTE — H&P (Deleted)
  The note originally documented on this encounter has been moved the the encounter in which it belongs.  

## 2020-08-24 NOTE — Anesthesia Preprocedure Evaluation (Addendum)
Anesthesia Evaluation  Patient identified by MRN, date of birth, ID band Patient awake    Reviewed: Allergy & Precautions, NPO status , Patient's Chart, lab work & pertinent test results  History of Anesthesia Complications Negative for: history of anesthetic complications  Airway Mallampati: III  TM Distance: >3 FB Neck ROM: Full    Dental  (+) Dental Advisory Given, Teeth Intact   Pulmonary Current Smoker and Patient abstained from smoking.,    Pulmonary exam normal        Cardiovascular negative cardio ROS Normal cardiovascular exam     Neuro/Psych 6 mm right A2/ACA aneurysm and an 8 mm left MCA bifurcation aneurysm TIA   GI/Hepatic negative GI ROS, Neg liver ROS,   Endo/Other  negative endocrine ROS  Renal/GU negative Renal ROS  negative genitourinary   Musculoskeletal negative musculoskeletal ROS (+)   Abdominal   Peds  Hematology negative hematology ROS (+) Brilinta   Anesthesia Other Findings  Echo 2015: EF 55-60%, mild LVH, normal wall motion, normal diastolic function  Reproductive/Obstetrics                           Anesthesia Physical Anesthesia Plan  ASA: 3  Anesthesia Plan: General   Post-op Pain Management:    Induction: Intravenous  PONV Risk Score and Plan: 2 and Ondansetron, Dexamethasone, Treatment may vary due to age or medical condition and Midazolam  Airway Management Planned: Oral ETT  Additional Equipment: Arterial line  Intra-op Plan:   Post-operative Plan: Extubation in OR  Informed Consent: I have reviewed the patients History and Physical, chart, labs and discussed the procedure including the risks, benefits and alternatives for the proposed anesthesia with the patient or authorized representative who has indicated his/her understanding and acceptance.     Dental advisory given  Plan Discussed with:   Anesthesia Plan Comments: (PAT note  written 08/24/2020 by Myra Gianotti, PA-C. )       Anesthesia Quick Evaluation
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
## 2020-08-25 ENCOUNTER — Ambulatory Visit (HOSPITAL_COMMUNITY): Payer: Self-pay | Admitting: Certified Registered Nurse Anesthetist

## 2020-08-25 ENCOUNTER — Other Ambulatory Visit: Payer: Self-pay

## 2020-08-25 ENCOUNTER — Observation Stay (HOSPITAL_COMMUNITY): Payer: Self-pay

## 2020-08-25 ENCOUNTER — Ambulatory Visit (HOSPITAL_COMMUNITY)
Admission: RE | Admit: 2020-08-25 | Discharge: 2020-08-25 | Disposition: A | Discharge status: Home / Self Care | Payer: Self-pay | Source: Ambulatory Visit | Attending: Neuroradiology | Admitting: Neuroradiology

## 2020-08-25 ENCOUNTER — Inpatient Hospital Stay (HOSPITAL_COMMUNITY)
Admission: RE | Admit: 2020-08-25 | Discharge: 2020-08-27 | DRG: 025 | Disposition: A | Discharge status: Home / Self Care | Payer: Self-pay | Attending: Neuroradiology | Admitting: Neuroradiology

## 2020-08-25 ENCOUNTER — Encounter (HOSPITAL_COMMUNITY): Payer: Self-pay

## 2020-08-25 ENCOUNTER — Encounter (HOSPITAL_COMMUNITY)
Admission: RE | Disposition: A | Discharge status: Home / Self Care | Payer: Self-pay | Source: Home / Self Care | Attending: Neuroradiology

## 2020-08-25 DIAGNOSIS — S301XXA Contusion of abdominal wall, initial encounter: Secondary | ICD-10-CM

## 2020-08-25 DIAGNOSIS — Z8249 Family history of ischemic heart disease and other diseases of the circulatory system: Secondary | ICD-10-CM

## 2020-08-25 DIAGNOSIS — Z20822 Contact with and (suspected) exposure to covid-19: Secondary | ICD-10-CM | POA: Diagnosis present

## 2020-08-25 DIAGNOSIS — R0689 Other abnormalities of breathing: Secondary | ICD-10-CM

## 2020-08-25 DIAGNOSIS — Z803 Family history of malignant neoplasm of breast: Secondary | ICD-10-CM

## 2020-08-25 DIAGNOSIS — Z8673 Personal history of transient ischemic attack (TIA), and cerebral infarction without residual deficits: Secondary | ICD-10-CM

## 2020-08-25 DIAGNOSIS — Z7902 Long term (current) use of antithrombotics/antiplatelets: Secondary | ICD-10-CM

## 2020-08-25 DIAGNOSIS — F17210 Nicotine dependence, cigarettes, uncomplicated: Secondary | ICD-10-CM | POA: Diagnosis present

## 2020-08-25 DIAGNOSIS — Z7982 Long term (current) use of aspirin: Secondary | ICD-10-CM

## 2020-08-25 DIAGNOSIS — Z90711 Acquired absence of uterus with remaining cervical stump: Secondary | ICD-10-CM

## 2020-08-25 DIAGNOSIS — Z801 Family history of malignant neoplasm of trachea, bronchus and lung: Secondary | ICD-10-CM

## 2020-08-25 DIAGNOSIS — D6489 Other specified anemias: Secondary | ICD-10-CM | POA: Diagnosis present

## 2020-08-25 DIAGNOSIS — I671 Cerebral aneurysm, nonruptured: Principal | ICD-10-CM | POA: Diagnosis present

## 2020-08-25 DIAGNOSIS — J9601 Acute respiratory failure with hypoxia: Secondary | ICD-10-CM | POA: Diagnosis not present

## 2020-08-25 DIAGNOSIS — E785 Hyperlipidemia, unspecified: Secondary | ICD-10-CM | POA: Diagnosis present

## 2020-08-25 HISTORY — PX: IR ANGIO VERTEBRAL SEL VERTEBRAL BILAT MOD SED: IMG5369

## 2020-08-25 HISTORY — PX: IR INTRA CRAN STENT: IMG2345

## 2020-08-25 HISTORY — PX: IR TRANSCATH/EMBOLIZ: IMG695

## 2020-08-25 HISTORY — PX: IR 3D INDEPENDENT WKST: IMG2385

## 2020-08-25 HISTORY — PX: IR ANGIO INTRA EXTRACRAN SEL INTERNAL CAROTID BILAT MOD SED: IMG5363

## 2020-08-25 HISTORY — PX: IR US GUIDE VASC ACCESS RIGHT: IMG2390

## 2020-08-25 HISTORY — PX: IR CT HEAD LTD: IMG2386

## 2020-08-25 HISTORY — PX: RADIOLOGY WITH ANESTHESIA: SHX6223

## 2020-08-25 LAB — TYPE AND SCREEN
ABO/RH(D): A POS
Antibody Screen: NEGATIVE

## 2020-08-25 LAB — COMPREHENSIVE METABOLIC PANEL
ALT: 16 U/L (ref 0–44)
AST: 15 U/L (ref 15–41)
Albumin: 3.4 g/dL — ABNORMAL LOW (ref 3.5–5.0)
Alkaline Phosphatase: 60 U/L (ref 38–126)
Anion gap: 6 (ref 5–15)
BUN: 10 mg/dL (ref 6–20)
CO2: 23 mmol/L (ref 22–32)
Calcium: 9.1 mg/dL (ref 8.9–10.3)
Chloride: 112 mmol/L — ABNORMAL HIGH (ref 98–111)
Creatinine, Ser: 0.8 mg/dL (ref 0.44–1.00)
GFR, Estimated: >60 mL/min (ref >60)
Glucose, Bld: 91 mg/dL (ref 70–99)
Potassium: 3.8 mmol/L (ref 3.5–5.1)
Sodium: 141 mmol/L (ref 135–145)
Total Bilirubin: 0.6 mg/dL (ref 0.3–1.2)
Total Protein: 6.7 g/dL (ref 6.5–8.1)

## 2020-08-25 LAB — POCT I-STAT 7, (LYTES, BLD GAS, ICA,H+H)
Acid-base deficit: 2 mmol/L (ref 0.0–2.0)
Acid-base deficit: 3 mmol/L — ABNORMAL HIGH (ref 0.0–2.0)
Acid-base deficit: 4 mmol/L — ABNORMAL HIGH (ref 0.0–2.0)
Bicarbonate: 24.6 mmol/L (ref 20.0–28.0)
Bicarbonate: 22.1 mmol/L (ref 20.0–28.0)
Bicarbonate: 22.8 mmol/L (ref 20.0–28.0)
Calcium, Ion: 1.22 mmol/L (ref 1.15–1.40)
Calcium, Ion: 1.17 mmol/L (ref 1.15–1.40)
Calcium, Ion: 1.2 mmol/L (ref 1.15–1.40)
HCT: 34 % — ABNORMAL LOW (ref 36.0–46.0)
HCT: 27 % — ABNORMAL LOW (ref 36.0–46.0)
HCT: 28 % — ABNORMAL LOW (ref 36.0–46.0)
Hemoglobin: 11.6 g/dL — ABNORMAL LOW (ref 12.0–15.0)
Hemoglobin: 9.2 g/dL — ABNORMAL LOW (ref 12.0–15.0)
Hemoglobin: 9.5 g/dL — ABNORMAL LOW (ref 12.0–15.0)
O2 Saturation: 99 %
O2 Saturation: 100 %
O2 Saturation: 99 %
Potassium: 4.1 mmol/L (ref 3.5–5.1)
Potassium: 4 mmol/L (ref 3.5–5.1)
Potassium: 4.1 mmol/L (ref 3.5–5.1)
Sodium: 144 mmol/L (ref 135–145)
Sodium: 144 mmol/L (ref 135–145)
Sodium: 146 mmol/L — ABNORMAL HIGH (ref 135–145)
TCO2: 26 mmol/L (ref 22–32)
TCO2: 23 mmol/L (ref 22–32)
TCO2: 24 mmol/L (ref 22–32)
pCO2 arterial: 47.9 mmHg (ref 32.0–48.0)
pCO2 arterial: 44.3 mmHg (ref 32.0–48.0)
pCO2 arterial: 46 mmHg (ref 32.0–48.0)
pH, Arterial: 7.319 — ABNORMAL LOW (ref 7.350–7.450)
pH, Arterial: 7.304 — ABNORMAL LOW (ref 7.350–7.450)
pH, Arterial: 7.306 — ABNORMAL LOW (ref 7.350–7.450)
pO2, Arterial: 169 mmHg — ABNORMAL HIGH (ref 83.0–108.0)
pO2, Arterial: 161 mmHg — ABNORMAL HIGH (ref 83.0–108.0)
pO2, Arterial: 189 mmHg — ABNORMAL HIGH (ref 83.0–108.0)

## 2020-08-25 LAB — SARS CORONAVIRUS 2 BY RT PCR (HOSPITAL ORDER, PERFORMED IN ~~LOC~~ HOSPITAL LAB): SARS Coronavirus 2: NEGATIVE

## 2020-08-25 LAB — APTT: aPTT: 31 seconds (ref 24–36)

## 2020-08-25 LAB — PROTIME-INR
INR: 1 (ref 0.8–1.2)
Prothrombin Time: 13.3 seconds (ref 11.4–15.2)

## 2020-08-25 LAB — POCT ACTIVATED CLOTTING TIME
Activated Clotting Time: 150 seconds
Activated Clotting Time: 271 seconds
Activated Clotting Time: 277 seconds
Activated Clotting Time: 300 seconds
Activated Clotting Time: 231 seconds
Activated Clotting Time: 254 seconds
Activated Clotting Time: 213 seconds

## 2020-08-25 LAB — ABO/RH: ABO/RH(D): A POS

## 2020-08-25 LAB — SARS CORONAVIRUS 2 (TAT 6-24 HRS): SARS Coronavirus 2: POSITIVE — AB

## 2020-08-25 LAB — HEMOGLOBIN AND HEMATOCRIT, BLOOD
HCT: 28.6 % — ABNORMAL LOW (ref 36.0–46.0)
Hemoglobin: 8.7 g/dL — ABNORMAL LOW (ref 12.0–15.0)

## 2020-08-25 LAB — MRSA NEXT GEN BY PCR, NASAL: MRSA by PCR Next Gen: NOT DETECTED

## 2020-08-25 SURGERY — IR WITH ANESTHESIA
Anesthesia: General

## 2020-08-25 MED ORDER — PHENYLEPHRINE HCL-NACL 10-0.9 MG/250ML-% IV SOLN
INTRAVENOUS | Status: DC | PRN
  Administered 2020-08-25: 20 ug/min via INTRAVENOUS

## 2020-08-25 MED ORDER — ROCURONIUM BROMIDE 10 MG/ML (PF) SYRINGE
PREFILLED_SYRINGE | INTRAVENOUS | Status: DC | PRN
  Administered 2020-08-25: 40 mg via INTRAVENOUS
  Administered 2020-08-25: 20 mg via INTRAVENOUS
  Administered 2020-08-25: 70 mg via INTRAVENOUS
  Administered 2020-08-25: 30 mg via INTRAVENOUS

## 2020-08-25 MED ORDER — TICAGRELOR 90 MG PO TABS: 90.0000 mg | ORAL_TABLET | Freq: Two times a day (BID) | ORAL | Status: DC

## 2020-08-25 MED ORDER — FENTANYL CITRATE (PF) 100 MCG/2ML IJ SOLN: 50.0000 ug | INTRAMUSCULAR | Status: DC | PRN

## 2020-08-25 MED ORDER — IOHEXOL 240 MG/ML SOLN
INTRAMUSCULAR | Status: AC
  Filled 2020-08-25: qty 100

## 2020-08-25 MED ORDER — ONDANSETRON HCL 4 MG/2ML IJ SOLN
INTRAMUSCULAR | Status: DC | PRN
  Administered 2020-08-25: 4 mg via INTRAVENOUS

## 2020-08-25 MED ORDER — ALBUMIN HUMAN 5 % IV SOLN: INTRAVENOUS | Status: DC | PRN

## 2020-08-25 MED ORDER — ONDANSETRON HCL 4 MG/2ML IJ SOLN
4.0000 mg | Freq: Four times a day (QID) | INTRAMUSCULAR | Status: DC | PRN
Start: 2020-08-25 — End: 2020-08-27

## 2020-08-25 MED ORDER — SODIUM CHLORIDE 0.9 % IV SOLN: INTRAVENOUS | Status: DC

## 2020-08-25 MED ORDER — LIDOCAINE HCL 1 % IJ SOLN
INTRAMUSCULAR | Status: AC
  Filled 2020-08-25: qty 20

## 2020-08-25 MED ORDER — POLYETHYLENE GLYCOL 3350 17 G PO PACK: 17.0000 g | PACK | Freq: Every day | ORAL | Status: DC

## 2020-08-25 MED ORDER — OXYCODONE HCL 5 MG PO TABS
5.0000 mg | ORAL_TABLET | ORAL | Status: DC | PRN
  Administered 2020-08-26 (×2): 5 mg via ORAL
  Filled 2020-08-25 (×2): qty 1

## 2020-08-25 MED ORDER — ACETAMINOPHEN 650 MG RE SUPP: 650.0000 mg | RECTAL | Status: DC | PRN

## 2020-08-25 MED ORDER — PROPOFOL 500 MG/50ML IV EMUL
INTRAVENOUS | Status: DC | PRN
  Administered 2020-08-25: 100 ug/kg/min via INTRAVENOUS

## 2020-08-25 MED ORDER — TICAGRELOR 90 MG PO TABS
90.0000 mg | ORAL_TABLET | Freq: Two times a day (BID) | ORAL | Status: DC
  Administered 2020-08-25 – 2020-08-27 (×4): 90 mg via ORAL
  Filled 2020-08-25 (×4): qty 1

## 2020-08-25 MED ORDER — VERAPAMIL HCL 2.5 MG/ML IV SOLN
INTRAVENOUS | Status: AC
  Filled 2020-08-25: qty 4

## 2020-08-25 MED ORDER — PHENYLEPHRINE HCL-NACL 10-0.9 MG/250ML-% IV SOLN
INTRAVENOUS | Status: AC
  Filled 2020-08-25: qty 500

## 2020-08-25 MED ORDER — PROPOFOL 1000 MG/100ML IV EMUL
0.0000 ug/kg/min | INTRAVENOUS | Status: DC
  Administered 2020-08-25: 10 ug/kg/min via INTRAVENOUS

## 2020-08-25 MED ORDER — GLYCOPYRROLATE PF 0.2 MG/ML IJ SOSY
PREFILLED_SYRINGE | INTRAMUSCULAR | Status: DC | PRN
  Administered 2020-08-25: .2 mg via INTRAVENOUS

## 2020-08-25 MED ORDER — PROPOFOL 10 MG/ML IV BOLUS
INTRAVENOUS | Status: DC | PRN
  Administered 2020-08-25: 50 mg via INTRAVENOUS
  Administered 2020-08-25: 150 mg via INTRAVENOUS

## 2020-08-25 MED ORDER — HEPARIN SODIUM (PORCINE) 1000 UNIT/ML IJ SOLN
INTRAMUSCULAR | Status: DC | PRN
  Administered 2020-08-25: 5000 [IU] via INTRAVENOUS

## 2020-08-25 MED ORDER — ACETAMINOPHEN 325 MG PO TABS
650.0000 mg | ORAL_TABLET | ORAL | Status: DC | PRN
  Administered 2020-08-25 – 2020-08-27 (×3): 650 mg via ORAL
  Filled 2020-08-25 (×3): qty 2

## 2020-08-25 MED ORDER — DOCUSATE SODIUM 50 MG/5ML PO LIQD
100.0000 mg | Freq: Two times a day (BID) | ORAL | Status: DC
  Administered 2020-08-25: 100 mg
  Filled 2020-08-25: qty 10

## 2020-08-25 MED ORDER — FENTANYL CITRATE (PF) 100 MCG/2ML IJ SOLN
INTRAMUSCULAR | Status: DC | PRN
  Administered 2020-08-25: 100 ug via INTRAVENOUS

## 2020-08-25 MED ORDER — IOHEXOL 300 MG/ML  SOLN
100.0000 mL | Freq: Once | INTRAMUSCULAR | Status: AC | PRN
  Administered 2020-08-25: 42 mL via INTRA_ARTERIAL

## 2020-08-25 MED ORDER — PHENYLEPHRINE 40 MCG/ML (10ML) SYRINGE FOR IV PUSH (FOR BLOOD PRESSURE SUPPORT)
PREFILLED_SYRINGE | INTRAVENOUS | Status: DC | PRN
  Administered 2020-08-25: 40 ug via INTRAVENOUS
  Administered 2020-08-25: 80 ug via INTRAVENOUS
  Administered 2020-08-25: 40 ug via INTRAVENOUS
  Administered 2020-08-25 (×3): 80 ug via INTRAVENOUS
  Administered 2020-08-25: 40 ug via INTRAVENOUS
  Administered 2020-08-25 (×4): 80 ug via INTRAVENOUS

## 2020-08-25 MED ORDER — CHLORHEXIDINE GLUCONATE CLOTH 2 % EX PADS
6.0000 | MEDICATED_PAD | Freq: Every day | CUTANEOUS | Status: DC
  Administered 2020-08-25 – 2020-08-27 (×3): 6 via TOPICAL

## 2020-08-25 MED ORDER — PANTOPRAZOLE SODIUM 40 MG IV SOLR
40.0000 mg | Freq: Every day | INTRAVENOUS | Status: DC
  Administered 2020-08-26: 40 mg via INTRAVENOUS
  Filled 2020-08-25: qty 40

## 2020-08-25 MED ORDER — FENTANYL CITRATE (PF) 250 MCG/5ML IJ SOLN
INTRAMUSCULAR | Status: AC
  Filled 2020-08-25: qty 5

## 2020-08-25 MED ORDER — ACETAMINOPHEN 160 MG/5ML PO SOLN: 650.0000 mg | ORAL | Status: DC | PRN

## 2020-08-25 MED ORDER — CLEVIDIPINE BUTYRATE 0.5 MG/ML IV EMUL
0.0000 mg/h | INTRAVENOUS | Status: DC
  Administered 2020-08-25: 1 mg/h via INTRAVENOUS
  Administered 2020-08-26: 16 mg/h via INTRAVENOUS
  Administered 2020-08-26: 4 mg/h via INTRAVENOUS
  Filled 2020-08-25 (×3): qty 50

## 2020-08-25 MED ORDER — DEXAMETHASONE SODIUM PHOSPHATE 10 MG/ML IJ SOLN
INTRAMUSCULAR | Status: DC | PRN
  Administered 2020-08-25: 5 mg via INTRAVENOUS

## 2020-08-25 MED ORDER — CHLORHEXIDINE GLUCONATE 0.12 % MT SOLN
OROMUCOSAL | Status: AC
  Administered 2020-08-25: 15 mL
  Filled 2020-08-25: qty 15

## 2020-08-25 MED ORDER — IOHEXOL 240 MG/ML SOLN
150.0000 mL | Freq: Once | INTRAMUSCULAR | Status: AC | PRN
  Administered 2020-08-25: 100 mL via INTRAVENOUS

## 2020-08-25 MED ORDER — ASPIRIN 81 MG PO CHEW
81.0000 mg | CHEWABLE_TABLET | Freq: Every day | ORAL | Status: DC
  Administered 2020-08-26 – 2020-08-27 (×2): 81 mg via ORAL
  Filled 2020-08-25 (×2): qty 1

## 2020-08-25 MED ORDER — ASPIRIN 81 MG PO CHEW: 81.0000 mg | CHEWABLE_TABLET | Freq: Every day | ORAL | Status: DC

## 2020-08-25 MED ORDER — IOHEXOL 240 MG/ML SOLN
50.0000 mL | Freq: Once | INTRAMUSCULAR | Status: AC | PRN
  Administered 2020-08-25: 20 mL via INTRAVENOUS

## 2020-08-25 MED ORDER — LIDOCAINE 2% (20 MG/ML) 5 ML SYRINGE
INTRAMUSCULAR | Status: DC | PRN
  Administered 2020-08-25: 100 mg via INTRAVENOUS

## 2020-08-25 NOTE — Progress Notes (Signed)
[place] Progress Note Patient Name: Betty Archer DOB: 1960-05-19 MRN: [id number]   Date of Service  08/25/2020  HPI/Events of Note  Patient asking for something a little stronger than Tylenol for back pain, Tylenol did not abolish the pain.  eICU Interventions  PRN Oxycodone 5 mg tablet ordered.        Kerry Kass Manpreet Strey 08/25/2020, 10:34 PM

## 2020-08-25 NOTE — Procedures (Signed)
INTERVENTIONAL NEURORADIOLOGY BRIEF POSTPROCEDURE NOTE  Diagnostic cerebral angiogram and aneurysm embolization   Attending: Dr. Pedro Earls  Assistant: None.   Diagnosis: Right A2/ACA aneurysm.   Access site: Right common femoral artery, 8 [place].   Access closure: 8 French Angio-Seal plus manual pressure   Anesthesia: General anesthesia   Medication used: Refer to anesthesia documentation.  Complications: Large right groin hematoma noticed during the intervention, controlled with manual pressure.   Estimated blood loss: 500 mL   Specimen: None.   Findings: Irregularly-shaped wide neck right A2/ACA aneurysm measuring approximately 7 mm.  Irregularly-shaped wide neck left M2/MCA aneurysm.  Embolization of the right ACA aneurysm performed with stent assisted coiling ("Y" stenting).  No evidence of intracranial thromboembolic or hemorrhagic complication.   CT of the abdomen and pelvis performed immediately after the procedure with no evidence of retroperitoneal hematoma.   Patient transferred to ICU for observation and vent management.   Family updated by Brynda Greathouse PA by phone.  Please call neuro IR on call for any concerns.

## 2020-08-25 NOTE — Anesthesia Procedure Notes (Signed)
Arterial Line Insertion Start/End7/07/2020 8:50 AM Performed by: Fulton Reek, CRNA, CRNA  Patient location: Pre-op. Preanesthetic checklist: patient identified, IV checked, site marked, risks and benefits discussed, surgical consent, monitors and equipment checked, pre-op evaluation, timeout performed and anesthesia consent Lidocaine 1% used for infiltration Left, radial was placed Catheter size: 20 G Hand hygiene performed  and maximum sterile barriers used   Attempts: 2 Procedure performed without using ultrasound guided technique. Following insertion, dressing applied and Biopatch. Post procedure assessment: normal and unchanged  Patient tolerated the procedure well with no immediate complications. Additional procedure comments: Attempt x 1 by SRNA.Marland Kitchen

## 2020-08-25 NOTE — Sedation Documentation (Addendum)
Angioseal deployed to RIGHT femoral artery puncture site by Dr. Debbrah Alar. RDP 2+ RPT +Doppler.

## 2020-08-25 NOTE — Consult Note (Signed)
NAME:  Betty Archer MRN:  [id number] DOB:  [birth year] LOS: 0 ADMISSION DATE:  08/25/2020 CONSULTATION DATE:  08/25/2020 REFERRING MD: de [place]:  Aneurysm s/p repair, acute hypoxemic respiratory failure   History of Present Illness:  60 year old female with PMHx significant for headaches, HLD and TIA who presented to [facility] 7/6 for scheduled Neuro IR intervention of R ACA, L MCA aneurysms.  Initially presented to [facility] ED 6/19 with HA and intermittent R arm numbness. CTA Head/Neck demonstrated 48mm R A2/ACA aneurysm and 77mm L MCA bifurcation aneurysm. She was subsequently placed on DAPT (ASA/Brilinta) and scheduled for intervention with Neuro IR on 7/6. During intervention 7/6, patient developed a large R groin hematoma at the vascular access site, thought to be due to a superficial vessel on entry. Bleeding stopped with pressure alone and CT of the groin was negative for RP hematoma. Patient ultimately was coiled and stented x 2. CT Head post-procedure was negative.  Patient remained on vent post-procedure and PCCM was consulted for ventilator management.  Pertinent Medical History:  Aneurysm (R ACA, L MCA), TIA, headaches, HLD  Significant Hospital Events: Including procedures, antibiotic start and stop dates in addition to other pertinent events   7/6 - Presented to [facility] for aneurysm intervention. Sustained groin hematoma at entry site. CT negative for RP bleed. Coil and stent x 2. CT Head post-procedure negative. Remained on vent, PCCM consulted.  Interim History / Subjective:  PCCM consulted  Objective:  Blood pressure (!) 150/78, pulse (!) 56, temperature 98.6 F (37 C), temperature source Oral, resp. rate 18, height 5\' 6"  (1.676 m), weight 89.8 kg, SpO2 100 %.    Vent Mode: PRVC FiO2 (%):  [100 %] 100 % Set Rate:  [18 bmp] 18 bmp Vt Set:  [480 mL] 480 mL PEEP:  [5 cmH20] 5 cmH20 Plateau Pressure:  [15 cmH20] 15 cmH20   Intake/Output Summary (Last 24  hours) at 08/25/2020 1426 Last data filed at 08/25/2020 1300 Gross per 24 hour  Intake 1500 ml  Output 1050 ml  Net 450 ml   Filed Weights   08/25/20 1165  Weight: 89.8 kg   Physical Examination: General: Acutely ill-appearing middle-aged woman in NAD. HEENT: [place]/AT, anicteric sclera, PERRL (46mm), moist mucous membranes. ETT in place. Neuro: Sedated. Responds to verbal stimuli. Not following commands. +Cough and +Gag  CV: RRR, no m/g/r. PULM: Breathing even and unlabored on vent (PEEP 5, FiO2 40%). Lung fields CTAB. GI: Soft, nontender, nondistended. Normoactive bowel sounds. Extremities: No LE edema noted. R groin hematoma noted with ecchymosis, swelling and mild induration. Skin: Warm/dry, no rashes.  Labs/imaging that I have personally reviewed: (right click and "Reselect all SmartList Selections" daily)  I-STAT pH 7.319/47.9/169/24.6 Na 144, K 4.1, Ca 1.22, Hgb 11.6/Hct 34.0  Repeat Hgb pending  CT A/P 7/6 IMPRESSION: 1. No retroperitoneal hemorrhage. Extensive right anterior groin, extra-abdominal fat stranding compatible with hematoma in the setting of recent vascular access. No single measurable fluid collection is identified. This study is insufficient to evaluate for pseudoaneurysm or active extravasation. 2.  Aortic Atherosclerosis (ICD10-I70.0). 3. No acute abdominopelvic abnormality.  CT Head 7/6 Read pending  Resolved Hospital Problem List:   N/A  Assessment & Plan:  [name] is a 60 year old female seen in consultation by PCCM at the request of Neuro IR for ventilator management post-procedure.  Acute hypoxemic respiratory failure, post-procedure - Continue full vent support (4-8cc/kg IBW) for now - Wean FiO2 for O2 sat > 90%,  rapidly able to wean from 100% FiO2 to 40% on exam without desaturation - Daily WUA/SBT - VAP bundle - Pulmonary hygiene - PAD protocol for sedation: Propofol and Fentanyl for goal RASS 0 to -1 - Wean to extubate once clinically  appropriate, suspect this will be this evening (7/6) as patient's sedation is weaned off  Right A2/ACA aneurysm and left MCA aneurysm, s/p intervention S/p intervention with NIR 7/6. Underwent coil and stenting x 2. On DAPT. - SBP goal 120-160, titrate Neo to goal - Continue DAPT (ASA, Brilinta) - Frequent neuro checks - Neuroprotective measures: HOB > 30 degrees, normoglycemia, normothermia, electrolytes WNL  Right groin hematoma, post-procedure - Monitor R groin site for signs/symptoms of active bleeding/rebleed - Trend H&H  Best Practice: (right click and "Reselect all SmartList Selections" daily)   Diet/type: NPO DVT prophylaxis: other GI prophylaxis: PPI Lines: N/A Foley:  N/A Code Status:  full code Last date of multidisciplinary goals of care discussion [Per Primary]  Labs:   CBC: Recent Labs  Lab 08/25/20 1139  HGB 11.6*  HCT 27.0*   Basic Metabolic Panel: Recent Labs  Lab 08/25/20 0630 08/25/20 1139  NA 141 144  K 3.8 4.1  CL 112*  --   CO2 23  --   GLUCOSE 91  --   BUN 10  --   CREATININE 0.80  --   CALCIUM 9.1  --    GFR: Estimated Creatinine Clearance: 84.4 mL/min (by C-G formula based on SCr of 0.8 mg/dL). No results for input(s): PROCALCITON, WBC, LATICACIDVEN in the last 168 hours.  Liver Function Tests: Recent Labs  Lab 08/25/20 0630  AST 15  ALT 16  ALKPHOS 60  BILITOT 0.6  PROT 6.7  ALBUMIN 3.4*   No results for input(s): LIPASE, AMYLASE in the last 168 hours. No results for input(s): AMMONIA in the last 168 hours.  ABG:    Component Value Date/Time   PHART 7.319 (L) 08/25/2020 1139   PCO2ART 47.9 08/25/2020 1139   PO2ART 169 (H) 08/25/2020 1139   HCO3 24.6 08/25/2020 1139   TCO2 26 08/25/2020 1139   ACIDBASEDEF 2.0 08/25/2020 1139   O2SAT 99.0 08/25/2020 1139    Coagulation Profile: Recent Labs  Lab 08/25/20 0630  INR 1.0   Cardiac Enzymes: No results for input(s): CKTOTAL, CKMB, CKMBINDEX, TROPONINI in the last 168  hours.  HbA1C: Hemoglobin A1C  Date/Time Value Ref Range Status  10/16/2014 04:18 PM 5.3  Final   Hgb A1c MFr Bld  Date/Time Value Ref Range Status  08/12/2013 04:59 PM 5.7 (H) <5.7 % Final    Comment:    (NOTE)                                                                       According to the ADA Clinical Practice Recommendations for 2011, when HbA1c is used as a screening test:  >=6.5%   Diagnostic of Diabetes Mellitus           (if abnormal result is confirmed) 5.7-6.4%   Increased risk of developing Diabetes Mellitus References:Diagnosis and Classification of Diabetes Mellitus,Diabetes [id number],9381,82(XHBZJ 1):S62-S69 and Standards of Medical Care in         Diabetes - 2011,Diabetes IRCV,8938,10 (Suppl 1):S11-S61.  CBG: No results for input(s): GLUCAP in the last 168 hours.  Review of Systems:   Patient is encephalopathic and/or intubated. Therefore history has been obtained from chart review.   Past Medical History:  She,  has a past medical history of Hyperlipidemia.   Surgical History:   Past Surgical History:  Procedure Laterality Date   BREAST BIOPSY      Core biopsy done on April 2003   COLONOSCOPY     PARTIAL HYSTERECTOMY   10/22/1999    Still has cervix   TRIGGER FINGER RELEASE Left 12/25/2013   Procedure: LEFT THUMB TRIGGER RELEASE ;  Surgeon: Leanora Cover, MD;  Location: [facility];  Service: Orthopedics;  Laterality: Left;     Social History:   reports that she has been smoking cigarettes. She has a 13.50 pack-year smoking history. She has never used smokeless tobacco. She reports current alcohol use. She reports that she does not use drugs.   Family History:  Her family history includes Breast cancer in her maternal aunt and maternal grandmother; Hypertension in her mother; Lung cancer in her father. There is no history of Colon cancer, Esophageal cancer, or Stomach cancer.   Allergies No Known Allergies   Home Medications  Prior to  Admission medications   Medication Sig Start Date End Date Taking? Authorizing Provider  aspirin 81 MG chewable tablet Chew 81 mg by mouth in the morning and at bedtime.   Yes [provider]  ticagrelor (BRILINTA) 90 MG TABS tablet Take 90 mg by mouth 2 (two) times daily.   Yes [provider]  oxyCODONE (ROXICODONE) 5 MG immediate release tablet Take 1 tablet (5 mg total) by mouth every 6 (six) hours as needed for up to 6 doses for severe pain. Patient not taking: No sig reported 08/08/20   Arnaldo Natal, MD    Critical care time: 35 minutes   Lestine Mount, PA-C [facility] 08/25/20 2:26 PM  Please see [email or web address] for pager details.  From 7A-7P if no response, please call [phone] After hours, please call E-Link [phone]

## 2020-08-25 NOTE — Progress Notes (Signed)
Pt transported from IR to CT with no complications/ Pt then transported from CT to 4N with no complications noted. Pt remained stable throughout transport.

## 2020-08-25 NOTE — Anesthesia Procedure Notes (Signed)
Procedure Name: Intubation Date/Time: 08/25/2020 9:11 AM Performed by: Donnelly Angelica, RN Pre-anesthesia Checklist: Patient identified, Emergency Drugs available, Suction available and Patient being monitored Patient Re-evaluated:Patient Re-evaluated prior to induction Oxygen Delivery Method: Circle system utilized Preoxygenation: Pre-oxygenation with 100% oxygen Induction Type: IV induction Ventilation: Mask ventilation without difficulty Laryngoscope Size: Mac and 3 Grade View: Grade I Tube type: Oral Tube size: 7.0 mm Number of attempts: 1 Airway Equipment and Method: Stylet and Oral airway Placement Confirmation: ETT inserted through vocal cords under direct vision, positive ETCO2 and breath sounds checked- equal and bilateral Secured at: 21 cm Tube secured with: Tape Dental Injury: Teeth and Oropharynx as per pre-operative assessment

## 2020-08-25 NOTE — Transfer of Care (Signed)
Immediate Anesthesia Transfer of Care Note  Patient: Betty Archer  Procedure(s) Performed: IR WITH ANESTHESIA EMBOLIZATION  Patient Location: ICU  Anesthesia Type:General  Level of Consciousness: sedated and Patient remains intubated per anesthesia plan  Airway & Oxygen Therapy: Patient remains intubated per anesthesia plan and Patient placed on Ventilator (see vital sign flow sheet for setting)  Post-op Assessment: Report given to RN and Post -op Vital signs reviewed and stable  Post vital signs: Reviewed and stable  Last Vitals:  Vitals Value Taken Time  BP 109/83 08/25/20 1420  Temp    Pulse 46 08/25/20 1429  Resp 18 08/25/20 1429  SpO2 100 % 08/25/20 1429  Vitals shown include unvalidated device data.  Last Pain:  Vitals:   08/25/20 0645  TempSrc:   PainSc: 0-No pain         Complications: No notable events documented.

## 2020-08-25 NOTE — Progress Notes (Signed)
  Post op round note.  Brief visit by Dr. Karenann Cai   Patient extubated.  Follows simple commands.  Right groin hematoma stable, soft.   Moves all fours. Oriented to person.  Hopefull D/C tomorrow.  Betty Archer S Emmalynne Courtney PA-C 08/25/2020 4:24 PM

## 2020-08-26 ENCOUNTER — Ambulatory Visit (HOSPITAL_COMMUNITY): Payer: Self-pay | Attending: Student

## 2020-08-26 ENCOUNTER — Encounter (HOSPITAL_COMMUNITY): Payer: Self-pay | Admitting: Neuroradiology

## 2020-08-26 DIAGNOSIS — R1030 Lower abdominal pain, unspecified: Secondary | ICD-10-CM | POA: Insufficient documentation

## 2020-08-26 DIAGNOSIS — I724 Aneurysm of artery of lower extremity: Secondary | ICD-10-CM

## 2020-08-26 LAB — COMPREHENSIVE METABOLIC PANEL
ALT: 11 U/L (ref 0–44)
AST: 13 U/L — ABNORMAL LOW (ref 15–41)
Albumin: 3.2 g/dL — ABNORMAL LOW (ref 3.5–5.0)
Alkaline Phosphatase: 49 U/L (ref 38–126)
Anion gap: 5 (ref 5–15)
BUN: 5 mg/dL — ABNORMAL LOW (ref 6–20)
CO2: 23 mmol/L (ref 22–32)
Calcium: 8.7 mg/dL — ABNORMAL LOW (ref 8.9–10.3)
Chloride: 112 mmol/L — ABNORMAL HIGH (ref 98–111)
Creatinine, Ser: 0.58 mg/dL (ref 0.44–1.00)
GFR, Estimated: >60 mL/min (ref >60)
Glucose, Bld: 99 mg/dL (ref 70–99)
Potassium: 3.9 mmol/L (ref 3.5–5.1)
Sodium: 140 mmol/L (ref 135–145)
Total Bilirubin: 0.6 mg/dL (ref 0.3–1.2)
Total Protein: 5.7 g/dL — ABNORMAL LOW (ref 6.5–8.1)

## 2020-08-26 LAB — CBC
HCT: 27 % — ABNORMAL LOW (ref 36.0–46.0)
Hemoglobin: 8.4 g/dL — ABNORMAL LOW (ref 12.0–15.0)
MCH: 24.1 pg — ABNORMAL LOW (ref 26.0–34.0)
MCHC: 31.1 g/dL (ref 30.0–36.0)
MCV: 77.6 fL — ABNORMAL LOW (ref 80.0–100.0)
Platelets: 155 10*3/uL (ref 150–400)
RBC: 3.48 MIL/uL — ABNORMAL LOW (ref 3.87–5.11)
RDW: 15.5 % (ref 11.5–15.5)
WBC: 11.6 10*3/uL — ABNORMAL HIGH (ref 4.0–10.5)
nRBC: 0 % (ref 0.0–0.2)

## 2020-08-26 LAB — MAGNESIUM: Magnesium: 1.9 mg/dL (ref 1.7–2.4)

## 2020-08-26 LAB — TRIGLYCERIDES: Triglycerides: 130 mg/dL (ref <150)

## 2020-08-26 LAB — PHOSPHORUS: Phosphorus: 3.6 mg/dL (ref 2.5–4.6)

## 2020-08-26 MED ORDER — PANTOPRAZOLE SODIUM 40 MG PO TBEC
40.0000 mg | DELAYED_RELEASE_TABLET | Freq: Every day | ORAL | Status: DC
  Administered 2020-08-26: 40 mg via ORAL
  Filled 2020-08-26: qty 1

## 2020-08-26 MED ORDER — DOCUSATE SODIUM 100 MG PO CAPS
100.0000 mg | ORAL_CAPSULE | Freq: Two times a day (BID) | ORAL | Status: DC
  Administered 2020-08-26 – 2020-08-27 (×2): 100 mg via ORAL
  Filled 2020-08-26 (×2): qty 1

## 2020-08-26 MED ORDER — METHOCARBAMOL 500 MG PO TABS
500.0000 mg | ORAL_TABLET | Freq: Four times a day (QID) | ORAL | Status: DC | PRN
  Administered 2020-08-26: 500 mg via ORAL
  Filled 2020-08-26: qty 1

## 2020-08-26 MED ORDER — FERROUS SULFATE 325 (65 FE) MG PO TABS: 325.0000 mg | ORAL_TABLET | Freq: Every day | ORAL | 3 refills | Status: DC

## 2020-08-26 MED ORDER — SIMETHICONE 80 MG PO CHEW
80.0000 mg | CHEWABLE_TABLET | Freq: Four times a day (QID) | ORAL | Status: DC | PRN
  Administered 2020-08-26: 80 mg via ORAL
  Filled 2020-08-26: qty 1

## 2020-08-26 MED ORDER — MAGNESIUM SULFATE 2 GM/50ML IV SOLN
2.0000 g | Freq: Once | INTRAVENOUS | Status: AC
  Administered 2020-08-26: 2 g via INTRAVENOUS
  Filled 2020-08-26: qty 50

## 2020-08-26 MED ORDER — DOCUSATE SODIUM 100 MG PO CAPS: 100.0000 mg | ORAL_CAPSULE | Freq: Two times a day (BID) | ORAL | 0 refills | Status: DC

## 2020-08-26 NOTE — Consult Note (Signed)
NAME:  Betty Archer MRN:  [id number] DOB:  Dec 20, 1960 LOS: 0 ADMISSION DATE:  08/25/2020 CONSULTATION DATE:  08/25/2020 REFERRING MD: de Betty Archer:  Aneurysm s/p repair, acute hypoxemic respiratory failure   History of Present Illness:  60 year old female with PMHx significant for headaches, HLD and TIA who presented to [facility] 7/6 for scheduled Neuro IR intervention of R ACA, L MCA aneurysms.  Initially presented to [facility] ED 6/19 with HA and intermittent R arm numbness. CTA Head/Neck demonstrated 41mm R A2/ACA aneurysm and 37mm L MCA bifurcation aneurysm. She was subsequently placed on DAPT (ASA/Brilinta) and scheduled for intervention with Neuro IR on 7/6. During intervention 7/6, patient developed a large R groin hematoma at the vascular access site, thought to be due to a superficial vessel on entry. Bleeding stopped with pressure alone and CT of the groin was negative for RP hematoma. Patient ultimately was coiled and stented x 2. CT Head post-procedure was negative.  Patient remained on vent post-procedure and PCCM was consulted for ventilator management.  Pertinent Medical History:  Aneurysm (R ACA, L MCA), TIA, headaches, HLD  Significant Hospital Events: Including procedures, antibiotic start and stop dates in addition to other pertinent events   7/6 - Presented to [facility] for aneurysm intervention. Sustained groin hematoma at entry site. CT negative for RP bleed. Coil and stent x 2. CT Head post-procedure negative. Remained on vent, PCCM consulted.  Interim History / Subjective:  CT abdomen and pelvis was repeated which showed a stable right groin hematoma Post CT patient was placed on pressure support trial and sedation was stopped She was successfully extubated This morning she was complaining of headache and neck pain 3 out of 10, denies nausea, vomiting or other complaints  Objective:  Blood pressure 134/86, pulse 62, temperature 97.8 F (36.6 C), temperature  source Axillary, resp. rate 20, height 5\' 6"  (1.676 m), weight 81.7 kg, SpO2 100 %.    Vent Mode: PSV;CPAP FiO2 (%):  [40 %-100 %] 40 % Set Rate:  [18 bmp] 18 bmp Vt Set:  [480 mL] 480 mL PEEP:  [5 cmH20] 5 cmH20 Pressure Support:  [10 cmH20] 10 cmH20 Plateau Pressure:  [15 cmH20] 15 cmH20   Intake/Output Summary (Last 24 hours) at 08/26/2020 1019 Last data filed at 08/26/2020 0300 Gross per 24 hour  Intake 1923.75 ml  Output 2800 ml  Net -876.25 ml   Filed Weights   08/25/20 3220 08/25/20 1415  Weight: 89.8 kg 81.7 kg   Physical Examination: General: Acutely ill-appearing middle-aged woman, lying in the bed HEENT: [place]/AT, anicteric sclera, PERRL (31mm), moist mucous membranes.  Neuro: Alert, awake, following commands, moving all 4 extremities PULM: Clear to auscultation bilaterally, no wheezes GI: Soft, nontender, nondistended. Normoactive bowel sounds. Extremities: No LE edema noted. R groin hematoma noted with ecchymosis, swelling and mild induration. Skin: Warm/dry, no rashes.  Labs/imaging that I have personally reviewed: (right click and "Reselect all SmartList Selections" daily)  I-STAT pH 7.319/47.9/169/24.6 Na 144, K 4.1, Ca 1.22, Hgb 11.6/Hct 34.0  Repeat Hgb pending  CT A/P 7/6 IMPRESSION: 1. No retroperitoneal hemorrhage. Extensive right anterior groin, extra-abdominal fat stranding compatible with hematoma in the setting of recent vascular access. No single measurable fluid collection is identified. This study is insufficient to evaluate for pseudoaneurysm or active extravasation. 2.  Aortic Atherosclerosis (ICD10-I70.0). 3. No acute abdominopelvic abnormality.  CT Head 7/6 Read pending  Resolved Hospital Problem List:   Acute respiratory insufficiency postprocedure  Assessment &  Plan:  [name] is a 60 year old female seen in consultation by PCCM at the request of Neuro IR for ventilator management post-procedure.  Right A2/ACA aneurysm and left MCA  aneurysm, s/p intervention S/p intervention with NIR 7/6. Underwent coil and stenting x 2. On DAPT. SBP goal 120-160 Defer rest of the management to neuro IR  Right groin hematoma, post-procedure H&H trended down to 8.3 Ultrasound right groin showed no pseudoaneurysm or AV fistula  Best Practice: (right click and "Reselect all SmartList Selections" daily)   Diet/type: Regular diet DVT prophylaxis: other GI prophylaxis: PPI Lines: N/A Foley:  N/A Code Status:  full code Last date of multidisciplinary goals of care discussion [Per Primary]   PCCM will sign off, please call with questions Labs:   CBC: Recent Labs  Lab 08/25/20 1139 08/25/20 1244 08/25/20 1323 08/25/20 1520 08/26/20 0606  WBC  --   --   --   --  11.6*  HGB 11.6* 9.5* 9.2* 8.7* 8.4*  HCT 34.0* 28.0* 27.0* 28.6* 27.0*  MCV  --   --   --   --  77.6*  PLT  --   --   --   --  850   Basic Metabolic Panel: Recent Labs  Lab 08/25/20 0630 08/25/20 1139 08/25/20 1244 08/25/20 1323 08/26/20 0606  NA 141 144 146* 144 140  K 3.8 4.1 4.0 4.1 3.9  CL 112*  --   --   --  112*  CO2 23  --   --   --  23  GLUCOSE 91  --   --   --  99  BUN 10  --   --   --  5*  CREATININE 0.80  --   --   --  0.58  CALCIUM 9.1  --   --   --  8.7*  MG  --   --   --   --  1.9  PHOS  --   --   --   --  3.6   GFR: Estimated Creatinine Clearance: 80.6 mL/min (by C-G formula based on SCr of 0.58 mg/dL). Recent Labs  Lab 08/26/20 0606  WBC 11.6*    Liver Function Tests: Recent Labs  Lab 08/25/20 0630 08/26/20 0606  AST 15 13*  ALT 16 11  ALKPHOS 60 49  BILITOT 0.6 0.6  PROT 6.7 5.7*  ALBUMIN 3.4* 3.2*   No results for input(s): LIPASE, AMYLASE in the last 168 hours. No results for input(s): AMMONIA in the last 168 hours.  ABG:    Component Value Date/Time   PHART 7.306 (L) 08/25/2020 1323   PCO2ART 44.3 08/25/2020 1323   PO2ART 189 (H) 08/25/2020 1323   HCO3 22.1 08/25/2020 1323   TCO2 23 08/25/2020 1323    ACIDBASEDEF 4.0 (H) 08/25/2020 1323   O2SAT 100.0 08/25/2020 1323    Coagulation Profile: Recent Labs  Lab 08/25/20 0630  INR 1.0   Cardiac Enzymes: No results for input(s): CKTOTAL, CKMB, CKMBINDEX, TROPONINI in the last 168 hours.  HbA1C: Hemoglobin A1C  Date/Time Value Ref Range Status  10/16/2014 04:18 PM 5.3  Final   Hgb A1c MFr Bld  Date/Time Value Ref Range Status  08/12/2013 04:59 PM 5.7 (H) <5.7 % Final    Comment:    (NOTE)  According to the ADA Clinical Practice Recommendations for 2011, when HbA1c is used as a screening test:  >=6.5%   Diagnostic of Diabetes Mellitus           (if abnormal result is confirmed) 5.7-6.4%   Increased risk of developing Diabetes Mellitus References:Diagnosis and Classification of Diabetes Mellitus,Diabetes OHYW,7371,06(YIRSW 1):S62-S69 and Standards of Medical Care in         Diabetes - 2011,Diabetes NIOE,7035,00 (Suppl 1):S11-S61.   CBG: No results for input(s): GLUCAP in the last 168 hours.     Jacky Kindle MD [facility] See Amion for pager If no response to pager, please call [phone] until 7pm After 7pm, Please call E-link [phone]
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
## 2020-08-26 NOTE — Anesthesia Postprocedure Evaluation (Signed)
Anesthesia Post Note  Patient: Betty Archer  Procedure(s) Performed: IR WITH ANESTHESIA EMBOLIZATION     Patient location during evaluation: ICU Anesthesia Type: General Level of consciousness: patient remains intubated per anesthesia plan and sedated Pain management: pain level controlled Vital Signs Assessment: post-procedure vital signs reviewed and stable Respiratory status: patient remains intubated per anesthesia plan Cardiovascular status: blood pressure returned to baseline Postop Assessment: no apparent nausea or vomiting Anesthetic complications: no   No notable events documented.  Last Vitals:  Vitals:   08/26/20 0600 08/26/20 0700  BP: 117/66   Pulse: (!) 55 (!) 50  Resp: 18 15  Temp:    SpO2: 100% 100%    Last Pain:  Vitals:   08/26/20 0400  TempSrc: Oral  PainSc: Asleep                 Betty Archer

## 2020-08-26 NOTE — Progress Notes (Signed)
Limited right lower extremity arterial duplex completed. Refer to "CV Proc" under chart review to view preliminary results.  08/26/2020 10:06 AM Kelby Aline., MHA, RVT, RDCS, RDMS

## 2020-08-26 NOTE — Progress Notes (Signed)
Attempted ambulation with patient in anticipation of discharge. Patient complained of dizziness upon sitting edge of bed and while walking, her right knee seemed to give out occasionally. Nolon Nations PA with Radiology and awaiting next steps.

## 2020-08-27 LAB — CBC
HCT: 26.1 % — ABNORMAL LOW (ref 36.0–46.0)
Hemoglobin: 8.2 g/dL — ABNORMAL LOW (ref 12.0–15.0)
MCH: 24.3 pg — ABNORMAL LOW (ref 26.0–34.0)
MCHC: 31.4 g/dL (ref 30.0–36.0)
MCV: 77.2 fL — ABNORMAL LOW (ref 80.0–100.0)
Platelets: 167 10*3/uL (ref 150–400)
RBC: 3.38 MIL/uL — ABNORMAL LOW (ref 3.87–5.11)
RDW: 15.5 % (ref 11.5–15.5)
WBC: 7.3 10*3/uL (ref 4.0–10.5)
nRBC: 0 % (ref 0.0–0.2)

## 2020-08-27 LAB — LIPID PANEL
Cholesterol: 167 mg/dL (ref 0–200)
HDL: 38 mg/dL — ABNORMAL LOW (ref >40)
LDL Cholesterol: 112 mg/dL — ABNORMAL HIGH (ref 0–99)
Total CHOL/HDL Ratio: 4.4 RATIO
Triglycerides: 87 mg/dL (ref <150)
VLDL: 17 mg/dL (ref 0–40)

## 2020-08-27 NOTE — Plan of Care (Signed)
  RD consulted for nutrition education regarding iron deficiency anemia.  RD provided "Iron Rich Nutrition Therapy" handout from the [employer]. Emphasized the importance of consuming iron rich foods along with vitamin C rich foods and provided examples of high iron and high vitamin C foods. Discouraged tea and coffee consumption with meals and snacks.Teach back method used.  Expect good compliance.  Current diet order is Regular.  Labs and medications reviewed. No further nutrition interventions warranted at this time. RD contact information provided. If additional nutrition issues arise, please re-consult RD. Plan for d/c home today.   Lockie Pares., RD, LDN, CNSC See [facility] for contact information

## 2020-08-27 NOTE — Progress Notes (Signed)
Reviewed patient discharge AVS and supplied printed education on cerebral aneurysm angiogram and aftercare. All belongings accounted for and questions answered. Patient discharged to son's car via wheelchair.
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
## 2020-08-31 ENCOUNTER — Inpatient Hospital Stay (HOSPITAL_COMMUNITY)
Admission: EM | Admit: 2020-08-31 | Discharge: 2020-09-03 | DRG: 064 | Disposition: A | Discharge status: Rehabilitation Facility | Payer: Self-pay | Attending: Student | Admitting: Student

## 2020-08-31 ENCOUNTER — Emergency Department (HOSPITAL_COMMUNITY): Payer: Self-pay

## 2020-08-31 ENCOUNTER — Encounter (HOSPITAL_COMMUNITY): Payer: Self-pay

## 2020-08-31 ENCOUNTER — Other Ambulatory Visit: Payer: Self-pay

## 2020-08-31 DIAGNOSIS — Z20822 Contact with and (suspected) exposure to covid-19: Secondary | ICD-10-CM | POA: Diagnosis present

## 2020-08-31 DIAGNOSIS — G8194 Hemiplegia, unspecified affecting left nondominant side: Secondary | ICD-10-CM | POA: Diagnosis present

## 2020-08-31 DIAGNOSIS — Z7982 Long term (current) use of aspirin: Secondary | ICD-10-CM

## 2020-08-31 DIAGNOSIS — Y92410 Unspecified street and highway as the place of occurrence of the external cause: Secondary | ICD-10-CM

## 2020-08-31 DIAGNOSIS — D509 Iron deficiency anemia, unspecified: Secondary | ICD-10-CM

## 2020-08-31 DIAGNOSIS — R29704 NIHSS score 4: Secondary | ICD-10-CM | POA: Diagnosis present

## 2020-08-31 DIAGNOSIS — E785 Hyperlipidemia, unspecified: Secondary | ICD-10-CM | POA: Diagnosis present

## 2020-08-31 DIAGNOSIS — I5032 Chronic diastolic (congestive) heart failure: Secondary | ICD-10-CM | POA: Diagnosis present

## 2020-08-31 DIAGNOSIS — E782 Mixed hyperlipidemia: Secondary | ICD-10-CM

## 2020-08-31 DIAGNOSIS — Q278 Other specified congenital malformations of peripheral vascular system: Secondary | ICD-10-CM

## 2020-08-31 DIAGNOSIS — I671 Cerebral aneurysm, nonruptured: Secondary | ICD-10-CM | POA: Diagnosis present

## 2020-08-31 DIAGNOSIS — I110 Hypertensive heart disease with heart failure: Secondary | ICD-10-CM | POA: Diagnosis present

## 2020-08-31 DIAGNOSIS — Z8249 Family history of ischemic heart disease and other diseases of the circulatory system: Secondary | ICD-10-CM

## 2020-08-31 DIAGNOSIS — F17200 Nicotine dependence, unspecified, uncomplicated: Secondary | ICD-10-CM

## 2020-08-31 DIAGNOSIS — Z6831 Body mass index (BMI) 31.0-31.9, adult: Secondary | ICD-10-CM

## 2020-08-31 DIAGNOSIS — Z801 Family history of malignant neoplasm of trachea, bronchus and lung: Secondary | ICD-10-CM

## 2020-08-31 DIAGNOSIS — I63421 Cerebral infarction due to embolism of right anterior cerebral artery: Principal | ICD-10-CM | POA: Diagnosis present

## 2020-08-31 DIAGNOSIS — Z7902 Long term (current) use of antithrombotics/antiplatelets: Secondary | ICD-10-CM

## 2020-08-31 DIAGNOSIS — I63522 Cerebral infarction due to unspecified occlusion or stenosis of left anterior cerebral artery: Secondary | ICD-10-CM | POA: Diagnosis present

## 2020-08-31 DIAGNOSIS — I639 Cerebral infarction, unspecified: Secondary | ICD-10-CM | POA: Diagnosis present

## 2020-08-31 DIAGNOSIS — F05 Delirium due to known physiological condition: Secondary | ICD-10-CM | POA: Diagnosis present

## 2020-08-31 DIAGNOSIS — E119 Type 2 diabetes mellitus without complications: Secondary | ICD-10-CM | POA: Diagnosis present

## 2020-08-31 DIAGNOSIS — R29810 Facial weakness: Secondary | ICD-10-CM | POA: Diagnosis present

## 2020-08-31 DIAGNOSIS — G9341 Metabolic encephalopathy: Secondary | ICD-10-CM | POA: Diagnosis present

## 2020-08-31 DIAGNOSIS — F17210 Nicotine dependence, cigarettes, uncomplicated: Secondary | ICD-10-CM | POA: Diagnosis present

## 2020-08-31 DIAGNOSIS — I6300 Cerebral infarction due to thrombosis of unspecified precerebral artery: Secondary | ICD-10-CM | POA: Diagnosis present

## 2020-08-31 DIAGNOSIS — Z8616 Personal history of COVID-19: Secondary | ICD-10-CM

## 2020-08-31 DIAGNOSIS — R531 Weakness: Secondary | ICD-10-CM | POA: Diagnosis present

## 2020-08-31 DIAGNOSIS — Z8673 Personal history of transient ischemic attack (TIA), and cerebral infarction without residual deficits: Secondary | ICD-10-CM

## 2020-08-31 DIAGNOSIS — E669 Obesity, unspecified: Secondary | ICD-10-CM | POA: Diagnosis present

## 2020-08-31 DIAGNOSIS — G936 Cerebral edema: Secondary | ICD-10-CM | POA: Diagnosis present

## 2020-08-31 DIAGNOSIS — Z803 Family history of malignant neoplasm of breast: Secondary | ICD-10-CM

## 2020-08-31 LAB — COMPREHENSIVE METABOLIC PANEL
ALT: 14 U/L (ref 0–44)
AST: 15 U/L (ref 15–41)
Albumin: 3.9 g/dL (ref 3.5–5.0)
Alkaline Phosphatase: 77 U/L (ref 38–126)
Anion gap: 8 (ref 5–15)
BUN: 16 mg/dL (ref 6–20)
CO2: 25 mmol/L (ref 22–32)
Calcium: 9.4 mg/dL (ref 8.9–10.3)
Chloride: 107 mmol/L (ref 98–111)
Creatinine, Ser: 0.86 mg/dL (ref 0.44–1.00)
GFR, Estimated: >60 mL/min (ref >60)
Glucose, Bld: 90 mg/dL (ref 70–99)
Potassium: 3.7 mmol/L (ref 3.5–5.1)
Sodium: 140 mmol/L (ref 135–145)
Total Bilirubin: 0.4 mg/dL (ref 0.3–1.2)
Total Protein: 7.7 g/dL (ref 6.5–8.1)

## 2020-08-31 LAB — RAPID URINE DRUG SCREEN, HOSP PERFORMED
Amphetamines: NOT DETECTED
Barbiturates: NOT DETECTED
Benzodiazepines: NOT DETECTED
Cocaine: NOT DETECTED
Opiates: NOT DETECTED
Tetrahydrocannabinol: NOT DETECTED

## 2020-08-31 LAB — DIFFERENTIAL
Abs Immature Granulocytes: 0.09 10*3/uL — ABNORMAL HIGH (ref 0.00–0.07)
Basophils Absolute: 0.1 10*3/uL (ref 0.0–0.1)
Basophils Relative: 1 %
Eosinophils Absolute: 0.2 10*3/uL (ref 0.0–0.5)
Eosinophils Relative: 2 %
Immature Granulocytes: 1 %
Lymphocytes Relative: 32 %
Lymphs Abs: 3.1 10*3/uL (ref 0.7–4.0)
Monocytes Absolute: 0.7 10*3/uL (ref 0.1–1.0)
Monocytes Relative: 7 %
Neutro Abs: 5.6 10*3/uL (ref 1.7–7.7)
Neutrophils Relative %: 57 %

## 2020-08-31 LAB — CBC
HCT: 30.1 % — ABNORMAL LOW (ref 36.0–46.0)
Hemoglobin: 9.3 g/dL — ABNORMAL LOW (ref 12.0–15.0)
MCH: 24.3 pg — ABNORMAL LOW (ref 26.0–34.0)
MCHC: 30.9 g/dL (ref 30.0–36.0)
MCV: 78.8 fL — ABNORMAL LOW (ref 80.0–100.0)
Platelets: 246 10*3/uL (ref 150–400)
RBC: 3.82 MIL/uL — ABNORMAL LOW (ref 3.87–5.11)
RDW: 16.5 % — ABNORMAL HIGH (ref 11.5–15.5)
WBC: 9.7 10*3/uL (ref 4.0–10.5)
nRBC: 0.3 % — ABNORMAL HIGH (ref 0.0–0.2)

## 2020-08-31 LAB — URINALYSIS, ROUTINE W REFLEX MICROSCOPIC
Bilirubin Urine: NEGATIVE
Glucose, UA: NEGATIVE mg/dL
Hgb urine dipstick: NEGATIVE
Ketones, ur: NEGATIVE mg/dL
Leukocytes,Ua: NEGATIVE
Nitrite: NEGATIVE
Protein, ur: NEGATIVE mg/dL
Specific Gravity, Urine: 1.015 (ref 1.005–1.030)
pH: 6 (ref 5.0–8.0)

## 2020-08-31 LAB — I-STAT CHEM 8, ED
BUN: 17 mg/dL (ref 6–20)
Calcium, Ion: 1.14 mmol/L — ABNORMAL LOW (ref 1.15–1.40)
Chloride: 108 mmol/L (ref 98–111)
Creatinine, Ser: 0.8 mg/dL (ref 0.44–1.00)
Glucose, Bld: 90 mg/dL (ref 70–99)
HCT: 27 % — ABNORMAL LOW (ref 36.0–46.0)
Hemoglobin: 9.2 g/dL — ABNORMAL LOW (ref 12.0–15.0)
Potassium: 4.3 mmol/L (ref 3.5–5.1)
Sodium: 140 mmol/L (ref 135–145)
TCO2: 25 mmol/L (ref 22–32)

## 2020-08-31 LAB — PROTIME-INR
INR: 1 (ref 0.8–1.2)
Prothrombin Time: 13.3 seconds (ref 11.4–15.2)

## 2020-08-31 LAB — APTT: aPTT: 33 seconds (ref 24–36)

## 2020-08-31 LAB — CBG MONITORING, ED: Glucose-Capillary: 90 mg/dL (ref 70–99)

## 2020-08-31 MED ORDER — SODIUM CHLORIDE (PF) 0.9 % IJ SOLN
INTRAMUSCULAR | Status: AC
  Filled 2020-08-31: qty 50

## 2020-08-31 MED ORDER — IOHEXOL 350 MG/ML SOLN
100.0000 mL | Freq: Once | INTRAVENOUS | Status: AC | PRN
  Administered 2020-08-31: 100 mL via INTRAVENOUS

## 2020-08-31 NOTE — H&P (Signed)
History and Physical  Betty Archer PPJ:[id number] DOB: 06-Jun-1960 DOA: 08/31/2020  Referring physician: Lawerance Bach, MD  PCP: Patient, No Pcp Per (Inactive)  Patient coming from: Home  Chief Complaint: Altered mental status, right leg weakness  HPI: Betty Archer is a 60 y.o. female with medical history significant for hypertension, hyperlipidemia who recently had a repair of right ACA aneurysm, as well as left MCA aneurysm (unrepaired) which resulted in right groin hematoma that required prolonged intubation after the procedure.  Patient complained about right leg weakness and heaviness with difficulty in being able to stand or walk around 5 PM.  Patient denies speech or swallowing difficulty.  Per son at bedside, patient was not back to baseline functioning, memory seems not to be at baseline at this time per son at bedside.  ED Course:  In the emergency department, she was intermittently tachypneic, but other vital signs were within normal range.  Work-up in the ED showed microcytic anemia and normal BMP.  Urinalysis was normal and urine drug screen was negative. CT angiography view of head and neck shows no emergent large vessel occlusion or high-grade stenosis and unchanged 8 mm left MCA bifurcation aneurysm CT of head without contrast showed no acute intracranial abnormality Teleneurology was consulted was consulted and recommended further stroke work-up.  Hospitalist was asked to admit patient for further evaluation and management.  Review of Systems: Constitutional: Negative for chills and fever.  HENT: Negative for ear pain and sore throat.   Eyes: Negative for pain and visual disturbance.  Respiratory: Negative for cough, chest tightness and shortness of breath.   Cardiovascular: Negative for chest pain and palpitations.  Gastrointestinal: Negative for abdominal pain and vomiting.  Endocrine: Negative for polyphagia and polyuria.  Genitourinary: Negative for decreased urine  volume, dysuria, enuresis Musculoskeletal: Negative for arthralgias and back pain.  Skin: Negative for color change and rash.  Allergic/Immunologic: Negative for immunocompromised state.  Neurological: Positive for weakness and speech difficulty.  Negative for tremors, syncope, speech difficulty Hematological: Does not bruise/bleed easily.  All other systems reviewed and are negative   Past Medical History:  Diagnosis Date   Hyperlipidemia    Past Surgical History:  Procedure Laterality Date   BREAST BIOPSY      Core biopsy done on April 2003   COLONOSCOPY     IR 3D INDEPENDENT [facility]KST  08/25/2020   IR 3D INDEPENDENT [facility]KST  08/25/2020   IR ANGIO INTRA EXTRACRAN SEL INTERNAL CAROTID BILAT MOD SED  08/25/2020   IR ANGIO VERTEBRAL SEL VERTEBRAL BILAT MOD SED  08/25/2020   IR CT HEAD LTD  08/25/2020   IR INTRA CRAN STENT  08/25/2020   IR TRANSCATH/EMBOLIZ  08/25/2020   IR US GUIDE VASC ACCESS RIGHT  08/25/2020   PARTIAL HYSTERECTOMY   10/22/1999    Still has cervix   RADIOLOGY WITH ANESTHESIA N/A 08/25/2020   Procedure: IR WITH ANESTHESIA EMBOLIZATION;  Surgeon: Pedro Earls, MD;  Location: [place];  Service: Radiology;  Laterality: N/A;   TRIGGER FINGER RELEASE Left 12/25/2013   Procedure: LEFT THUMB TRIGGER RELEASE ;  Surgeon: Leanora Cover, MD;  Location: [place];  Service: Orthopedics;  Laterality: Left;    Social History:  reports that she has been smoking cigarettes. She has a 13.50 pack-year smoking history. She has never used smokeless tobacco. She reports current alcohol use. She reports that she does not use drugs.   No Known Allergies  Family History  Problem Relation  Age of Onset   Breast cancer Maternal Grandmother    Breast cancer Maternal Aunt    Hypertension Mother    Lung cancer Father    Colon cancer Neg Hx    Esophageal cancer Neg Hx    Stomach cancer Neg Hx      Prior to Admission medications   Medication Sig Start Date End Date Taking?  Authorizing Provider  aspirin 81 MG chewable tablet Chew 81 mg by mouth in the morning and at bedtime.    [provider]  docusate sodium (COLACE) 100 MG capsule Take 1 capsule (100 mg total) by mouth 2 (two) times daily. 08/26/20   Docia Barrier, PA  ferrous sulfate 325 (65 FE) MG tablet Take 1 tablet (325 mg total) by mouth daily with breakfast. 08/26/20   Docia Barrier, PA  ticagrelor (BRILINTA) 90 MG TABS tablet Take 90 mg by mouth 2 (two) times daily.    [provider]    Physical Exam: BP (!) 129/97   Pulse 84   Temp 98.7 F (37.1 C)   Resp (!) 22   Ht 5\' 6"  (1.676 m)   Wt 89.8 kg   SpO2 100%   BMI 31.95 kg/m   General: 60 y.o. year-old female well developed well nourished in no acute distress.  Alert and oriented x3. HEENT: NCAT, EOMI Neck: Supple, trachea medial Cardiovascular: Regular rate and rhythm with no rubs or gallops.  No thyromegaly or JVD noted.  No lower extremity edema. 2/4 pulses in all 4 extremities. Respiratory: Clear to auscultation with no wheezes or rales. Good inspiratory effort. Abdomen: Soft, nontender nondistended with normal bowel sounds x4 quadrants. Muskuloskeletal: No cyanosis, clubbing or edema noted bilaterally Neuro: Positive for confusion and motor weakness.  Left leg motor drift (+3) and left arm motor drift (+1). NIHSS 4 sensation, reflexes intact Skin: No ulcerative lesions noted or rashes Psychiatry:Mood is appropriate for condition and setting          Labs on Admission:  Basic Metabolic Panel: Recent Labs  Lab 08/25/20 0630 08/25/20 1139 08/25/20 1323 08/26/20 0606 08/31/20 2100 08/31/20 2148 09/01/20 0500  NA 141   < > 144 140 140 140 138  K 3.8   < > 4.1 3.9 3.7 4.3 4.0  CL 112*  --   --  112* 107 108 108  CO2 23  --   --  23 25  --  22  GLUCOSE 91  --   --  99 90 90 107*  BUN 10  --   --  5* 16 17 13   CREATININE 0.80  --   --  0.58 0.86 0.80 0.59  CALCIUM 9.1  --   --  8.7* 9.4  --   9.1  MG  --   --   --  1.9  --   --  2.0  PHOS  --   --   --  3.6  --   --  3.8   < > = values in this interval not displayed.   Liver Function Tests: Recent Labs  Lab 08/25/20 0630 08/26/20 0606 08/31/20 2100 09/01/20 0500  AST 15 13* 15 14*  ALT 16 11 14 13   ALKPHOS 60 49 77 66  BILITOT 0.6 0.6 0.4 0.5  PROT 6.7 5.7* 7.7 7.1  ALBUMIN 3.4* 3.2* 3.9 3.8   No results for input(s): LIPASE, AMYLASE in the last 168 hours. No results for input(s): AMMONIA in the last 168 hours. CBC:  Recent Labs  Lab 08/26/20 0606 08/27/20 1031 08/31/20 2100 08/31/20 2148 09/01/20 0500  WBC 11.6* 7.3 9.7  --  8.8  NEUTROABS  --   --  5.6  --   --   HGB 8.4* 8.2* 9.3* 9.2* 8.9*  HCT 27.0* 26.1* 30.1* 27.0* 28.5*  MCV 77.6* 77.2* 78.8*  --  78.5*  PLT 155 167 246  --  220   Cardiac Enzymes: No results for input(s): CKTOTAL, CKMB, CKMBINDEX, TROPONINI in the last 168 hours.  BNP (last 3 results) No results for input(s): BNP in the last 8760 hours.  ProBNP (last 3 results) No results for input(s): PROBNP in the last 8760 hours.  CBG: Recent Labs  Lab 08/31/20 2105  GLUCAP 90    Radiological Exams on Admission: CT HEAD CODE STROKE WO CONTRAST  Result Date: 08/31/2020 CLINICAL DATA:  Code stroke. Lower extremity tingling and encephalopathy EXAM: CT HEAD WITHOUT CONTRAST TECHNIQUE: Contiguous axial images were obtained from the base of the skull through the vertex without intravenous contrast. COMPARISON:  Head CT 08/08/2020 FINDINGS: Brain: There is no mass, hemorrhage or extra-axial collection. The size and configuration of the ventricles and extra-axial CSF spaces are normal. The brain parenchyma is normal, without evidence of acute or chronic infarction. Vascular: Aneurysm clips at the A-comm. Skull: The visualized skull base, calvarium and extracranial soft tissues are normal. Sinuses/Orbits: No fluid levels or advanced mucosal thickening of the visualized paranasal sinuses. No mastoid  or middle ear effusion. The orbits are normal. ASPECTS [facility] Stroke Program Early CT Score) - Ganglionic level infarction (caudate, lentiform nuclei, internal capsule, insula, M1-M3 cortex): 7 - Supraganglionic infarction (M4-M6 cortex): 3 Total score (0-10 with 10 being normal): 10 IMPRESSION: 1. No acute intracranial abnormality. 2. ASPECTS is 10. These results were called by telephone at the time of interpretation on 08/31/2020 at 10:07 pm to provider [facility] , who verbally acknowledged these results. Electronically Signed   By: Ulyses Jarred M.D.   On: 08/31/2020 22:07   CT ANGIO HEAD CODE STROKE  Result Date: 08/31/2020 CLINICAL DATA:  Lower extremity tingling and encephalopathy. EXAM: CT ANGIOGRAPHY HEAD AND NECK TECHNIQUE: Multidetector CT imaging of the head and neck was performed using the standard protocol during bolus administration of intravenous contrast. Multiplanar CT image reconstructions and MIPs were obtained to evaluate the vascular anatomy. Carotid stenosis measurements (when applicable) are obtained utilizing NASCET criteria, using the distal internal carotid diameter as the denominator. CONTRAST:  155mL OMNIPAQUE IOHEXOL 350 MG/ML SOLN COMPARISON:  08/08/2020 FINDINGS: CTA NECK FINDINGS SKELETON: There is no bony spinal canal stenosis. No lytic or blastic lesion. OTHER NECK: Normal pharynx, larynx and major salivary glands. No cervical lymphadenopathy. Unremarkable thyroid gland. UPPER CHEST: No pneumothorax or pleural effusion. No nodules or masses. AORTIC ARCH: There is calcific atherosclerosis of the aortic arch. There is no aneurysm, dissection or hemodynamically significant stenosis of the visualized portion of the aorta. Aberrant right subclavian artery. the visualized proximal subclavian arteries are widely patent. RIGHT CAROTID SYSTEM: Normal without aneurysm, dissection or stenosis. LEFT CAROTID SYSTEM: Normal without aneurysm, dissection or stenosis. VERTEBRAL ARTERIES: Left  dominant configuration. Both origins are clearly patent. There is no dissection, occlusion or flow-limiting stenosis to the skull base (V1-V3 segments). CTA HEAD FINDINGS POSTERIOR CIRCULATION: --Vertebral arteries: Normal V4 segments. --Inferior cerebellar arteries: Normal. --Basilar artery: Normal. --Superior cerebellar arteries: Normal. --Posterior cerebral arteries (PCA): Normal. ANTERIOR CIRCULATION: --Intracranial internal carotid arteries: Normal. --Anterior cerebral arteries (ACA): Coil mass at the anterior communicating  artery. No visible residual aneurysm filling. --Middle cerebral arteries (MCA): Unchanged 8 mm aneurysm of the left MCA bifurcation. Normal right MCA. No occlusion. VENOUS SINUSES: As permitted by contrast timing, patent. ANATOMIC VARIANTS: None Review of the MIP images confirms the above findings. IMPRESSION: 1. No emergent large vessel occlusion or high-grade stenosis. 2. 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 3. Coil mass at the anterior communicating artery without visible residual aneurysm filling. 4. Aberrant right subclavian artery. Aortic Atherosclerosis (ICD10-I70.0). Electronically Signed   By: Ulyses Jarred M.D.   On: 08/31/2020 22:49   CT ANGIO NECK CODE STROKE  Result Date: 08/31/2020 CLINICAL DATA:  Lower extremity tingling and encephalopathy. EXAM: CT ANGIOGRAPHY HEAD AND NECK TECHNIQUE: Multidetector CT imaging of the head and neck was performed using the standard protocol during bolus administration of intravenous contrast. Multiplanar CT image reconstructions and MIPs were obtained to evaluate the vascular anatomy. Carotid stenosis measurements (when applicable) are obtained utilizing NASCET criteria, using the distal internal carotid diameter as the denominator. CONTRAST:  129mL OMNIPAQUE IOHEXOL 350 MG/ML SOLN COMPARISON:  08/08/2020 FINDINGS: CTA NECK FINDINGS SKELETON: There is no bony spinal canal stenosis. No lytic or blastic lesion. OTHER NECK: Normal  pharynx, larynx and major salivary glands. No cervical lymphadenopathy. Unremarkable thyroid gland. UPPER CHEST: No pneumothorax or pleural effusion. No nodules or masses. AORTIC ARCH: There is calcific atherosclerosis of the aortic arch. There is no aneurysm, dissection or hemodynamically significant stenosis of the visualized portion of the aorta. Aberrant right subclavian artery. the visualized proximal subclavian arteries are widely patent. RIGHT CAROTID SYSTEM: Normal without aneurysm, dissection or stenosis. LEFT CAROTID SYSTEM: Normal without aneurysm, dissection or stenosis. VERTEBRAL ARTERIES: Left dominant configuration. Both origins are clearly patent. There is no dissection, occlusion or flow-limiting stenosis to the skull base (V1-V3 segments). CTA HEAD FINDINGS POSTERIOR CIRCULATION: --Vertebral arteries: Normal V4 segments. --Inferior cerebellar arteries: Normal. --Basilar artery: Normal. --Superior cerebellar arteries: Normal. --Posterior cerebral arteries (PCA): Normal. ANTERIOR CIRCULATION: --Intracranial internal carotid arteries: Normal. --Anterior cerebral arteries (ACA): Coil mass at the anterior communicating artery. No visible residual aneurysm filling. --Middle cerebral arteries (MCA): Unchanged 8 mm aneurysm of the left MCA bifurcation. Normal right MCA. No occlusion. VENOUS SINUSES: As permitted by contrast timing, patent. ANATOMIC VARIANTS: None Review of the MIP images confirms the above findings. IMPRESSION: 1. No emergent large vessel occlusion or high-grade stenosis. 2. 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 3. Coil mass at the anterior communicating artery without visible residual aneurysm filling. 4. Aberrant right subclavian artery. Aortic Atherosclerosis (ICD10-I70.0). Electronically Signed   By: Ulyses Jarred M.D.   On: 08/31/2020 22:49    EKG: I independently viewed the EKG done and my findings are as followed: Normal sinus rhythm at a rate of 86  bpm  Assessment/Plan Present on Admission:  Acute ischemic stroke ([place])  Aneurysm of anterior communicating artery  Hyperlipidemia  Principal Problem:   Acute ischemic stroke [facility]) Active Problems:   Hyperlipidemia   Microcytic anemia   Aneurysm of anterior communicating artery   Acute ischemic stroke Patient will be admitted to telemetry unit  CT of head without contrast showed no acute intracranial abnormality CT angiography view of head and neck shows no emergent large vessel occlusion or high-grade stenosis  Echocardiogram in the morning MRI of brain without contrast in the morning Continue aspirin and statin s/p MRI Continue fall precautions and neuro checks Lipid panel and hemoglobin A1c will be checked Continue PT/OT eval and treat Consider speech therapy if  patient fails bedside swallow eval Bedside swallow eval by nursing prior to diet Neurology will be consulted and we shall await further recommendation  Microcytic anemia MCV 78.8; H/H 9.3/30.1 Iron studies will be checked  Hyperlipidemia Patient was not on home statin per med rec Consider starting patient on statin  Aneurysm of anterior communicating artery Stable   DVT prophylaxis: SCDs  Code Status: Full code  Family Communication: Son at bedside (all questions answered to satisfaction)  Disposition Plan:  Patient is from:                        home Anticipated DC to:                   SNF or family members home Anticipated DC date:               2-3 days Anticipated DC barriers:          Patient requires inpatient management due to acute stroke requiring further stroke work-up and pending neurology consult    Consults called: Neurology  Admission status: Observation   Bernadette Hoit MD [facility]  09/01/2020, 6:02 AM

## 2020-08-31 NOTE — ED Provider Notes (Signed)
[place] DEPT Provider Note   CSN: [id number] Arrival date & time: 08/31/20  2033     History Chief Complaint  Patient presents with   Altered Mental Status   Numbness    Betty Archer is a 61 y.o. female.  On 08/25/20 she had embolization and stenting of her A2 ACA.  She had a right groin hematoma that required prolonged intubation after the procedure.  She also developed symptomatic anemia.  She has a middle cerebral artery aneurysm as well that was not addressed.  Today, she called one of her sons around 8 PM and reported that her legs were weak.  When her son arrived to the [place], he helped her to the car with assistance.  During the car ride, he noted that she was confused.  She was also unable to get out of the car without assistance.  This is completely different than her typical baseline.  Incidentally positive for COVID-19 on 08/25/20.  The history is provided by the patient and a relative (2 of her sons).  Neurologic Problem This is a new problem. The current episode started 1 to 2 hours ago (approximately 8 pm). The problem has not changed since onset.Pertinent negatives include no chest pain, no abdominal pain, no headaches and no shortness of breath. Nothing aggravates the symptoms. Nothing relieves the symptoms. She has tried nothing for the symptoms. The treatment provided no relief.      Past Medical History:  Diagnosis Date   Hyperlipidemia     Patient Active Problem List   Diagnosis Date Noted   Aneurysm of anterior communicating artery 08/25/2020   Aneurysm, cerebral, nonruptured 08/25/2020   Microcytosis 10/26/2014   Vitamin D deficiency 10/17/2014   Stenosing tenosynovitis of thumb 10/17/2014   Internal hemorrhoids 10/17/2014   History of colon polyps 10/17/2014   Osteoarthritis of carpometacarpal joints of both thumbs 04/04/2014   Pre-diabetes 08/18/2013   History of TIA (transient ischemic attack) 08/12/2013   Routine  health maintenance 08/15/2010   Hyperlipidemia 08/05/2008   Obesity 07/30/2007   TOBACCO DEPENDENCE 04/19/2006    Past Surgical History:  Procedure Laterality Date   BREAST BIOPSY      Core biopsy done on April 2003   COLONOSCOPY     IR 3D INDEPENDENT [facility]ST  08/25/2020   IR 3D INDEPENDENT [facility]ST  08/25/2020   IR ANGIO INTRA EXTRACRAN SEL INTERNAL CAROTID BILAT MOD SED  08/25/2020   IR ANGIO VERTEBRAL SEL VERTEBRAL BILAT MOD SED  08/25/2020   IR CT HEAD LTD  08/25/2020   IR INTRA CRAN STENT  08/25/2020   IR TRANSCATH/EMBOLIZ  08/25/2020   IR US GUIDE VASC ACCESS RIGHT  08/25/2020   PARTIAL HYSTERECTOMY   10/22/1999    Still has cervix   RADIOLOGY WITH ANESTHESIA N/A 08/25/2020   Procedure: IR WITH ANESTHESIA EMBOLIZATION;  Surgeon: Pedro Earls, MD;  Location: [facility];  Service: Radiology;  Laterality: N/A;   TRIGGER FINGER RELEASE Left 12/25/2013   Procedure: LEFT THUMB TRIGGER RELEASE ;  Surgeon: Leanora Cover, MD;  Location: [facility];  Service: Orthopedics;  Laterality: Left;     OB History   No obstetric history on file.     Family History  Problem Relation Age of Onset   Breast cancer Maternal Grandmother    Breast cancer Maternal Aunt    Hypertension Mother    Lung cancer Father    Colon cancer Neg Hx    Esophageal cancer Neg  Hx    Stomach cancer Neg Hx     Social History   Tobacco Use   Smoking status: Every Day    Packs/day: 0.50    Years: 27.00    Pack years: 13.50    Types: Cigarettes   Smokeless tobacco: Never  Vaping Use   Vaping Use: Former  Substance Use Topics   Alcohol use: Yes    Alcohol/week: 0.0 standard drinks    Comment:  patient is a social drinker   Drug use: No    Home Medications Prior to Admission medications   Medication Sig Start Date End Date Taking? Authorizing Provider  aspirin 81 MG chewable tablet Chew 81 mg by mouth in the morning and at bedtime.    [provider]  docusate sodium (COLACE) 100 MG capsule  Take 1 capsule (100 mg total) by mouth 2 (two) times daily. 08/26/20   Docia Barrier, PA  ferrous sulfate 325 (65 FE) MG tablet Take 1 tablet (325 mg total) by mouth daily with breakfast. 08/26/20   Docia Barrier, PA  ticagrelor (BRILINTA) 90 MG TABS tablet Take 90 mg by mouth 2 (two) times daily.    [provider]    Allergies    Patient has no known allergies.  Review of Systems   Review of Systems  Constitutional:  Negative for chills and fever.  HENT:  Negative for ear pain and sore throat.   Eyes:  Negative for pain and visual disturbance.  Respiratory:  Negative for cough and shortness of breath.   Cardiovascular:  Negative for chest pain and palpitations.  Gastrointestinal:  Negative for abdominal pain and vomiting.  Genitourinary:  Negative for dysuria and hematuria.  Musculoskeletal:  Negative for arthralgias and back pain.  Skin:  Negative for color change and rash.  Neurological:  Positive for speech difficulty and weakness. Negative for seizures, syncope and headaches.  Psychiatric/Behavioral:  Positive for confusion.   All other systems reviewed and are negative.  Physical Exam Updated Vital Signs BP (!) 140/100   Pulse 94   Temp 98.7 F (37.1 C)   Resp 15   Ht 5\' 6"  (1.676 m)   Wt 89.8 kg   SpO2 96%   BMI 31.95 kg/m   Physical Exam Vitals and nursing note reviewed.  Constitutional:      Appearance: She is well-developed.  HENT:     Head: Normocephalic and atraumatic.  Cardiovascular:     Rate and Rhythm: Normal rate and regular rhythm.     Heart sounds: Normal heart sounds.  Pulmonary:     Effort: Pulmonary effort is normal. No tachypnea.     Breath sounds: Normal breath sounds.  Abdominal:     Palpations: Abdomen is soft.     Tenderness: There is no abdominal tenderness.  Musculoskeletal:     Right lower leg: No edema.     Left lower leg: No edema.  Skin:    General: Skin is warm and dry.  Neurological:     Mental  Status: She is confused.     Cranial Nerves: Cranial nerve deficit and facial asymmetry present. No dysarthria.     Sensory: Sensation is intact.     Motor: Weakness present.     Coordination: Coordination is intact.     Comments: The patient has difficulty reporting her history.  She is able to participate with the neurologic exam with some prompting.  She appears to have left upper and lower extremity weakness.  She  has a left-sided facial droop involving the mouth.  Psychiatric:        Mood and Affect: Mood normal.        Behavior: Behavior normal.    ED Results / Procedures / Treatments   Labs (all labs ordered are listed, but only abnormal results are displayed) Labs Reviewed  CBC - Abnormal; Notable for the following components:      Result Value   RBC 3.82 (*)    Hemoglobin 9.3 (*)    HCT 30.1 (*)    MCV 78.8 (*)    MCH 24.3 (*)    RDW 16.5 (*)    nRBC 0.3 (*)    All other components within normal limits  DIFFERENTIAL - Abnormal; Notable for the following components:   Abs Immature Granulocytes 0.09 (*)    All other components within normal limits  I-STAT CHEM 8, ED - Abnormal; Notable for the following components:   Calcium, Ion 1.14 (*)    Hemoglobin 9.2 (*)    HCT 27.0 (*)    All other components within normal limits  COMPREHENSIVE METABOLIC PANEL  PROTIME-INR  APTT  RAPID URINE DRUG SCREEN, HOSP PERFORMED  URINALYSIS, ROUTINE W REFLEX MICROSCOPIC  CBG MONITORING, ED    EKG EKG Interpretation  Date/Time:  Tuesday August 31 2020 21:36:18 EDT Ventricular Rate:  86 PR Interval:  135 QRS Duration: 86 QT Interval:  391 QTC Calculation: 468 R Axis:   77 Text Interpretation: Sinus rhythm Right atrial enlargement Probable right ventricular hypertrophy No acute ischemia Confirmed by Lorre Munroe ([id number]) on 08/31/2020 9:50:49 PM  Radiology CT HEAD CODE STROKE WO CONTRAST  Result Date: 08/31/2020 CLINICAL DATA:  Code stroke. Lower extremity tingling and  encephalopathy EXAM: CT HEAD WITHOUT CONTRAST TECHNIQUE: Contiguous axial images were obtained from the base of the skull through the vertex without intravenous contrast. COMPARISON:  Head CT 08/08/2020 FINDINGS: Brain: There is no mass, hemorrhage or extra-axial collection. The size and configuration of the ventricles and extra-axial CSF spaces are normal. The brain parenchyma is normal, without evidence of acute or chronic infarction. Vascular: Aneurysm clips at the A-comm. Skull: The visualized skull base, calvarium and extracranial soft tissues are normal. Sinuses/Orbits: No fluid levels or advanced mucosal thickening of the visualized paranasal sinuses. No mastoid or middle ear effusion. The orbits are normal. ASPECTS [facility] Stroke Program Early CT Score) - Ganglionic level infarction (caudate, lentiform nuclei, internal capsule, insula, M1-M3 cortex): 7 - Supraganglionic infarction (M4-M6 cortex): 3 Total score (0-10 with 10 being normal): 10 IMPRESSION: 1. No acute intracranial abnormality. 2. ASPECTS is 10. These results were called by telephone at the time of interpretation on 08/31/2020 at 10:07 pm to provider [facility] , who verbally acknowledged these results. Electronically Signed   By: Ulyses Jarred M.D.   On: 08/31/2020 22:07   CT ANGIO HEAD CODE STROKE  Result Date: 08/31/2020 CLINICAL DATA:  Lower extremity tingling and encephalopathy. EXAM: CT ANGIOGRAPHY HEAD AND NECK TECHNIQUE: Multidetector CT imaging of the head and neck was performed using the standard protocol during bolus administration of intravenous contrast. Multiplanar CT image reconstructions and MIPs were obtained to evaluate the vascular anatomy. Carotid stenosis measurements (when applicable) are obtained utilizing NASCET criteria, using the distal internal carotid diameter as the denominator. CONTRAST:  123mL OMNIPAQUE IOHEXOL 350 MG/ML SOLN COMPARISON:  08/08/2020 FINDINGS: CTA NECK FINDINGS SKELETON: There is no bony spinal  canal stenosis. No lytic or blastic lesion. OTHER NECK: Normal pharynx, larynx and major salivary glands.  No cervical lymphadenopathy. Unremarkable thyroid gland. UPPER CHEST: No pneumothorax or pleural effusion. No nodules or masses. AORTIC ARCH: There is calcific atherosclerosis of the aortic arch. There is no aneurysm, dissection or hemodynamically significant stenosis of the visualized portion of the aorta. Aberrant right subclavian artery. the visualized proximal subclavian arteries are widely patent. RIGHT CAROTID SYSTEM: Normal without aneurysm, dissection or stenosis. LEFT CAROTID SYSTEM: Normal without aneurysm, dissection or stenosis. VERTEBRAL ARTERIES: Left dominant configuration. Both origins are clearly patent. There is no dissection, occlusion or flow-limiting stenosis to the skull base (V1-V3 segments). CTA HEAD FINDINGS POSTERIOR CIRCULATION: --Vertebral arteries: Normal V4 segments. --Inferior cerebellar arteries: Normal. --Basilar artery: Normal. --Superior cerebellar arteries: Normal. --Posterior cerebral arteries (PCA): Normal. ANTERIOR CIRCULATION: --Intracranial internal carotid arteries: Normal. --Anterior cerebral arteries (ACA): Coil mass at the anterior communicating artery. No visible residual aneurysm filling. --Middle cerebral arteries (MCA): Unchanged 8 mm aneurysm of the left MCA bifurcation. Normal right MCA. No occlusion. VENOUS SINUSES: As permitted by contrast timing, patent. ANATOMIC VARIANTS: None Review of the MIP images confirms the above findings. IMPRESSION: 1. No emergent large vessel occlusion or high-grade stenosis. 2. 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 3. Coil mass at the anterior communicating artery without visible residual aneurysm filling. 4. Aberrant right subclavian artery. Aortic Atherosclerosis (ICD10-I70.0). Electronically Signed   By: Ulyses Jarred M.D.   On: 08/31/2020 22:49   CT ANGIO NECK CODE STROKE  Result Date: 08/31/2020 CLINICAL DATA:   Lower extremity tingling and encephalopathy. EXAM: CT ANGIOGRAPHY HEAD AND NECK TECHNIQUE: Multidetector CT imaging of the head and neck was performed using the standard protocol during bolus administration of intravenous contrast. Multiplanar CT image reconstructions and MIPs were obtained to evaluate the vascular anatomy. Carotid stenosis measurements (when applicable) are obtained utilizing NASCET criteria, using the distal internal carotid diameter as the denominator. CONTRAST:  166mL OMNIPAQUE IOHEXOL 350 MG/ML SOLN COMPARISON:  08/08/2020 FINDINGS: CTA NECK FINDINGS SKELETON: There is no bony spinal canal stenosis. No lytic or blastic lesion. OTHER NECK: Normal pharynx, larynx and major salivary glands. No cervical lymphadenopathy. Unremarkable thyroid gland. UPPER CHEST: No pneumothorax or pleural effusion. No nodules or masses. AORTIC ARCH: There is calcific atherosclerosis of the aortic arch. There is no aneurysm, dissection or hemodynamically significant stenosis of the visualized portion of the aorta. Aberrant right subclavian artery. the visualized proximal subclavian arteries are widely patent. RIGHT CAROTID SYSTEM: Normal without aneurysm, dissection or stenosis. LEFT CAROTID SYSTEM: Normal without aneurysm, dissection or stenosis. VERTEBRAL ARTERIES: Left dominant configuration. Both origins are clearly patent. There is no dissection, occlusion or flow-limiting stenosis to the skull base (V1-V3 segments). CTA HEAD FINDINGS POSTERIOR CIRCULATION: --Vertebral arteries: Normal V4 segments. --Inferior cerebellar arteries: Normal. --Basilar artery: Normal. --Superior cerebellar arteries: Normal. --Posterior cerebral arteries (PCA): Normal. ANTERIOR CIRCULATION: --Intracranial internal carotid arteries: Normal. --Anterior cerebral arteries (ACA): Coil mass at the anterior communicating artery. No visible residual aneurysm filling. --Middle cerebral arteries (MCA): Unchanged 8 mm aneurysm of the left MCA  bifurcation. Normal right MCA. No occlusion. VENOUS SINUSES: As permitted by contrast timing, patent. ANATOMIC VARIANTS: None Review of the MIP images confirms the above findings. IMPRESSION: 1. No emergent large vessel occlusion or high-grade stenosis. 2. 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 3. Coil mass at the anterior communicating artery without visible residual aneurysm filling. 4. Aberrant right subclavian artery. Aortic Atherosclerosis (ICD10-I70.0). Electronically Signed   By: Ulyses Jarred M.D.   On: 08/31/2020 22:49    Procedures .Critical Care  Date/Time: 08/31/2020  11:51 PM Performed by: Arnaldo Natal, MD Authorized by: Arnaldo Natal, MD   Critical care provider statement:    Critical care time (minutes):  30   Critical care time was exclusive of:  Separately billable procedures and treating other patients and teaching time   Critical care was necessary to treat or prevent imminent or life-threatening deterioration of the following conditions:  CNS failure or compromise   Critical care was time spent personally by me on the following activities:  Blood draw for specimens, development of treatment plan with patient or surrogate, discussions with consultants, evaluation of patient's response to treatment, examination of patient, ordering and performing treatments and interventions, ordering and review of laboratory studies, ordering and review of radiographic studies, pulse oximetry, re-evaluation of patient's condition and review of old charts   I assumed direction of critical care for this patient from another provider in my specialty: no     Care discussed with: admitting provider     Medications Ordered in ED Medications  sodium chloride (PF) 0.9 % injection (has no administration in time range)  iohexol (OMNIPAQUE) 350 MG/ML injection 100 mL (100 mLs Intravenous Contrast Given 08/31/20 2224)    ED Course  I have reviewed the triage vital signs and the nursing  notes.  Pertinent labs & imaging results that were available during my care of the patient were reviewed by me and considered in my medical decision making (see chart for details).  Clinical Course as of 08/31/20 2347  Tue Aug 31, 2020  2304 I spoke with teleneurology, and they think that the patient likely had a CVA.  Unfortunately, she is not a candidate for intervention secondary to her recent cerebral aneurysm intervention.  No LVO.  Recommend admission for further stroke work-up. [AW]    Clinical Course User Index [AW] Arnaldo Natal, MD   MDM Rules/Calculators/A&P                          Manual Meier presented with acute onset of left leg weakness, confusion, and facial droop.  She was evaluated as a code stroke.  She did not meet criteria for tPA or large vessel intervention.  She will be admitted for further stroke work-up and risk factor modification. Final Clinical Impression(s) / ED Diagnoses Final diagnoses:  Cerebrovascular accident (CVA), unspecified mechanism ([place])  TOBACCO DEPENDENCE  Aneurysm, cerebral, nonruptured  Hyperlipidemia, unspecified hyperlipidemia type    Rx / DC Orders ED Discharge Orders     None        Arnaldo Natal, MD 08/31/20 2352

## 2020-08-31 NOTE — ED Notes (Signed)
Tele-neurology call placed at 9:39 pm

## 2020-08-31 NOTE — Consult Note (Signed)
[facility] Tele[facility]urology Consult Services   Date of Service:   08/31/2020 21:42:59  Diagnosis:       I63.00 - Cerebrovascular accident (CVA) due to thrombosis of precerebral artery (HCCC)  Impression:      Clinical examination is suggestive of a right brain stroke. Unfortunately she is not a candidate for thrombolytics based on the duration of symptoms. Plain CT of the head is negative. CT angiogram did not show any evidence of large vessel occlusion and showed stable aneurysms. I would recommend admitting her for an MRI of the head and further stroke work-up.  Metrics: Last Known Well: 08/31/2020 17:00:00 [facility] Notification Time: 08/31/2020 21:41:21 Arrival Time: 08/31/2020 20:33:00 Stamp Time: 08/31/2020 21:42:59 Initial Response Time: 08/31/2020 21:49:02 Symptoms: AMS. NIHSS Start Assessment Time: 08/31/2020 22:04:54 Patient is not a candidate for Thrombolytic. Thrombolytic Medical Decision: 08/31/2020 22:13:45 Patient was not deemed candidate for Thrombolytic because of following reasons: Last Well Known Above 4.5 Hours.  CT head was reviewed and results were: I personally Reviewed the CT Head and it [facility]  ED Physician notified of diagnostic impression and management plan on 08/31/2020 22:58:43  Advanced Imaging: CTA Head and Neck Completed.  LVO:No  Patient doesn't meet criteria for emergent NIR consideration   Our recommendations are outlined below.  Recommendations:        Stroke/Telemetry Floor       Neuro Checks       Bedside Swallow Eval       DVT Prophylaxis       IV Fluids, Normal Saline       Head of Bed 30 Degrees       Euglycemia and Avoid Hyperthermia (PRN Acetaminophen)       Initiate or continue Aspirin 81 MG daily       Antihypertensives PRN if Blood pressure is greater than 220/120 or there is a concern for End organ damage/contraindications for permissive HTN. If blood pressure is greater than 220/120 give  labetalol PO or IV or Vasotec IV with a goal of 15% reduction in BP during the first 24 hours.  Routine Consultation with [facility] for Follow up Care  Sign Out:       Discussed with Emergency Department Provider    ------------------------------------------------------------------------------  History of Present Illness: Patient is a 60 year old Female.  Patient was brought by EMS for symptoms of AMS.  Extremely pleasant 60 year old female with past medical history of hypertension, hyperlipidemia, diabetes came to the hospital because of leg weakness. She reports around 5:00 she noticed that her legs were heavy and weak and she had difficulty standing or walking. Patient had a recent right anterior communicating artery aneurysm repair. She also has a left MCA aneurysm that was not repaired. Patient did develop a groin hematoma after the procedure. That was on the right side. She is complaining of leg weakness more so on the left side. Denies any speech or swallowing problems. Denies any headaches.    Past Medical History:      Hypertension      Diabetes Mellitus      Hyperlipidemia      Covid-19      ACA and MCA Aneuyrsm  Social History: Smoking: Yes Alcohol Use: No Drug Use: No  Family History:Unable to obtain due to Patient Status  Review of System:  14 Points Review of Systems was performed and was negative except mentioned in HPI.  Anticoagulant use:  No  Antiplatelet use: Yes Aspirin  Allergies:  Reviewed  Examination: BP(140/100), Pulse(94), 1A: Level of Consciousness - Alert; keenly responsive + 0 1B: Ask Month and Age - Both Questions Right + 0 1C: Blink Eyes & Squeeze Hands - Performs Both Tasks + 0 2: Test Horizontal Extraocular Movements - Normal + 0 3: Test Visual Fields - No Visual Loss + 0 4: Test Facial Palsy (Use Grimace if Obtunded) - Normal symmetry + 0 5A: Test Left Arm Motor Drift - Drift, but doesn't hit bed + 1 5B: Test Right Arm  Motor Drift - No Drift for 10 Seconds + 0 6A: Test Left Leg Motor Drift - No Effort Against Gravity + 3 6B: Test Right Leg Motor Drift - No Drift for 5 Seconds + 0 7: Test Limb Ataxia (FNF/Heel-Shin) - No Ataxia + 0 8: Test Sensation - Normal; No sensory loss + 0 9: Test Language/Aphasia - Normal; No aphasia + 0 10: Test Dysarthria - Normal + 0 11: Test Extinction/Inattention - No abnormality + 0  NIHSS Score: 4   Pre-Morbid Modified Rankin Scale: 2 Points = Slight disability; unable to carry out all previous activities, but able to look after own affairs without assistance   Patient/Family was informed the Neurology Consult would occur via TeleHealth consult by way of interactive audio and video telecommunications and consented to receiving care in this manner.   Patient is being evaluated for possible acute neurologic impairment and high probability of imminent or life-threatening deterioration. I spent total of 40 minutes providing care to this patient, including time for face to face visit via telemedicine, review of medical records, imaging studies and discussion of findings with providers, the patient and/or family.   Dr Faustino Congress   [facility] [phone]  Case [id number]

## 2020-08-31 NOTE — ED Triage Notes (Signed)
Per son, pt called him to pick her up because she was having tingling in her legs. By the time he arrived to pick her up from her home, he states that she was disoriented and kept repeating herself. Per son pt had an aneurysm operated on that was in the front of her head last Wednesday.

## 2020-08-31 NOTE — ED Notes (Signed)
Pt's son states that the pt called him on 08/31/2020 at 8pm and stated that she suddenly could not feel her legs. Pt's son says that her symptoms started at 8pm today.

## 2020-09-01 ENCOUNTER — Observation Stay (HOSPITAL_COMMUNITY): Payer: Self-pay

## 2020-09-01 ENCOUNTER — Other Ambulatory Visit (HOSPITAL_COMMUNITY): Payer: Self-pay

## 2020-09-01 DIAGNOSIS — I639 Cerebral infarction, unspecified: Secondary | ICD-10-CM | POA: Diagnosis present

## 2020-09-01 DIAGNOSIS — E785 Hyperlipidemia, unspecified: Secondary | ICD-10-CM

## 2020-09-01 LAB — COMPREHENSIVE METABOLIC PANEL
ALT: 13 U/L (ref 0–44)
AST: 14 U/L — ABNORMAL LOW (ref 15–41)
Albumin: 3.8 g/dL (ref 3.5–5.0)
Alkaline Phosphatase: 66 U/L (ref 38–126)
Anion gap: 8 (ref 5–15)
BUN: 13 mg/dL (ref 6–20)
CO2: 22 mmol/L (ref 22–32)
Calcium: 9.1 mg/dL (ref 8.9–10.3)
Chloride: 108 mmol/L (ref 98–111)
Creatinine, Ser: 0.59 mg/dL (ref 0.44–1.00)
GFR, Estimated: >60 mL/min (ref >60)
Glucose, Bld: 107 mg/dL — ABNORMAL HIGH (ref 70–99)
Potassium: 4 mmol/L (ref 3.5–5.1)
Sodium: 138 mmol/L (ref 135–145)
Total Bilirubin: 0.5 mg/dL (ref 0.3–1.2)
Total Protein: 7.1 g/dL (ref 6.5–8.1)

## 2020-09-01 LAB — CBC
HCT: 28.5 % — ABNORMAL LOW (ref 36.0–46.0)
Hemoglobin: 8.9 g/dL — ABNORMAL LOW (ref 12.0–15.0)
MCH: 24.5 pg — ABNORMAL LOW (ref 26.0–34.0)
MCHC: 31.2 g/dL (ref 30.0–36.0)
MCV: 78.5 fL — ABNORMAL LOW (ref 80.0–100.0)
Platelets: 220 10*3/uL (ref 150–400)
RBC: 3.63 MIL/uL — ABNORMAL LOW (ref 3.87–5.11)
RDW: 16.5 % — ABNORMAL HIGH (ref 11.5–15.5)
WBC: 8.8 10*3/uL (ref 4.0–10.5)
nRBC: 0 % (ref 0.0–0.2)

## 2020-09-01 LAB — HEMOGLOBIN A1C
Hgb A1c MFr Bld: 5.3 % (ref 4.8–5.6)
Mean Plasma Glucose: 105.41 mg/dL

## 2020-09-01 LAB — PROTIME-INR
INR: 1 (ref 0.8–1.2)
Prothrombin Time: 13.1 seconds (ref 11.4–15.2)

## 2020-09-01 LAB — LIPID PANEL
Cholesterol: 198 mg/dL (ref 0–200)
HDL: 51 mg/dL (ref >40)
LDL Cholesterol: 131 mg/dL — ABNORMAL HIGH (ref 0–99)
Total CHOL/HDL Ratio: 3.9 RATIO
Triglycerides: 82 mg/dL (ref <150)
VLDL: 16 mg/dL (ref 0–40)

## 2020-09-01 LAB — MAGNESIUM: Magnesium: 2 mg/dL (ref 1.7–2.4)

## 2020-09-01 LAB — HIV ANTIBODY (ROUTINE TESTING W REFLEX): HIV Screen 4th Generation wRfx: NONREACTIVE

## 2020-09-01 LAB — FERRITIN: Ferritin: 91 ng/mL (ref 11–307)

## 2020-09-01 LAB — IRON AND TIBC
Iron: 35 ug/dL (ref 28–170)
Saturation Ratios: 16 % (ref 10.4–31.8)
TIBC: 221 ug/dL — ABNORMAL LOW (ref 250–450)
UIBC: 186 ug/dL

## 2020-09-01 LAB — APTT: aPTT: 33 seconds (ref 24–36)

## 2020-09-01 LAB — PHOSPHORUS: Phosphorus: 3.8 mg/dL (ref 2.5–4.6)

## 2020-09-01 LAB — SARS CORONAVIRUS 2 (TAT 6-24 HRS): SARS Coronavirus 2: NEGATIVE

## 2020-09-01 MED ORDER — ACETAMINOPHEN 325 MG PO TABS
650.0000 mg | ORAL_TABLET | Freq: Four times a day (QID) | ORAL | Status: DC | PRN
  Administered 2020-09-01: 650 mg via ORAL
  Filled 2020-09-01: qty 2

## 2020-09-01 MED ORDER — LIDOCAINE 5 % EX PTCH
1.0000 | MEDICATED_PATCH | CUTANEOUS | Status: DC
  Administered 2020-09-01 – 2020-09-03 (×3): 1 via TRANSDERMAL
  Filled 2020-09-01 (×3): qty 1

## 2020-09-01 MED ORDER — LIP MEDEX EX OINT
TOPICAL_OINTMENT | Freq: Once | CUTANEOUS | Status: AC
  Filled 2020-09-01: qty 7

## 2020-09-01 MED ORDER — ASPIRIN 81 MG PO CHEW
81.0000 mg | CHEWABLE_TABLET | Freq: Every day | ORAL | Status: DC
  Administered 2020-09-01 – 2020-09-03 (×3): 81 mg via ORAL
  Filled 2020-09-01 (×3): qty 1

## 2020-09-01 MED ORDER — TICAGRELOR 90 MG PO TABS
90.0000 mg | ORAL_TABLET | Freq: Two times a day (BID) | ORAL | Status: DC
  Administered 2020-09-01 – 2020-09-03 (×5): 90 mg via ORAL
  Filled 2020-09-01 (×5): qty 1

## 2020-09-01 MED ORDER — ONDANSETRON HCL 4 MG/2ML IJ SOLN
4.0000 mg | Freq: Four times a day (QID) | INTRAMUSCULAR | Status: DC | PRN
  Administered 2020-09-03: 4 mg via INTRAVENOUS
  Filled 2020-09-01: qty 2

## 2020-09-01 MED ORDER — ATORVASTATIN CALCIUM 80 MG PO TABS
80.0000 mg | ORAL_TABLET | Freq: Every day | ORAL | Status: DC
  Administered 2020-09-01 – 2020-09-02 (×2): 80 mg via ORAL
  Filled 2020-09-01 (×2): qty 1

## 2020-09-01 NOTE — Progress Notes (Signed)
Patient ID: Betty Archer, female   DOB: 1960/08/07, 60 y.o.   MRN: [id number]  PROGRESS NOTE    Betty Archer  VPX:[id number] DOB: 04/27/1960 DOA: 08/31/2020 PCP: Patient, No Pcp Per (Inactive)   Brief Narrative:   60 y.o. female with medical history significant for hypertension, hyperlipidemia who recently had a repair of right ACA aneurysm, as well as left MCA aneurysm (unrepaired) which resulted in right groin hematoma that required intubation after the procedure with subsequent discharge on 08/27/2020 presented with altered mental status and leg weakness.  On presentation, CT of the head without contrast showed no acute intracranial abnormality.  CTA of head and neck showed no emergent large vessel occlusion or high-grade stenosis and unchanged 8 mm left MCA bifurcation aneurysm.  Teleneurology recommended stroke work-up.  Assessment & Plan:   Acute right ACA territory infarct -Patient presented with altered mental status and leg weakness. -CT of the head without contrast showed no acute intracranial abnormality.  CTA of head and neck showed no emergent large vessel occlusion or high-grade stenosis and unchanged 8 mm left MCA bifurcation aneurysm.  -MRI of brain showed acute right ACA territory infarct -Patient will be transferred to [facility] for neurology evaluation.  I have notified Dr. Bhagat/neurology -Patient was recently discharged on aspirin and Brilinta.  Resume them till neuro evaluates the patient.  Start Lipitor 80 mg daily.  LDL 131.  A1c 5.3. -2D echo. -PT/OT/SLP evaluation. -Mental status improving but still has leg weakness as per the son  Microcytic anemia -hemoglobin stable.  No signs of bleeding.  Outpatient follow-up  Hyperlipidemia -Plan as above  Right ACA aneurysm status post recent repair Left MCA aneurysm -Outpatient follow-up      DVT prophylaxis: SCDs Code Status: Full Family Communication: Son at bedside Disposition Plan: Status is:  Inpatient  Remains inpatient appropriate because:Inpatient level of care appropriate due to severity of illness  Dispo: The patient is from: Home              Anticipated d/c is to: Home              Patient currently is not medically stable to d/c.   Difficult to place patient No  Consultants: Neurology  Procedures: None  Antimicrobials: None   Subjective: Patient seen and examined at bedside.  Son at bedside.  Patient is a poor historian.  Son states that the mental status is improving but left leg is still weak.  No overnight fever or vomiting reported.  Objective: Vitals:   09/01/20 0130 09/01/20 0500 09/01/20 0800 09/01/20 0830  BP: 113/83 (!) 129/97 111/69 121/61  Pulse: 61 84 67 (!) 58  Resp: 20 (!) 22 (!) 22 14  Temp:      SpO2: 100% 100% 99% 98%  Weight:      Height:       No intake or output data in the 24 hours ending 09/01/20 1003 Filed Weights   08/31/20 2056  Weight: 89.8 kg    Examination:  General exam: Appears calm and comfortable.  Appears older than stated age. Respiratory system: Bilateral decreased breath sounds at bases Cardiovascular system: S1 & S2 heard, intermittently bradycardic Gastrointestinal system: Abdomen is nondistended, soft and nontender. Normal bowel sounds heard. Extremities: No cyanosis, clubbing, edema  Central nervous system: Awake, extremely slow to respond. No focal neurological deficits. Moving extremities.  Poor historian.  Left lower extremity weakness present. Skin: No rashes, lesions or ulcers Psychiatry: Mostly flat affect.  Hardly  participates in conversation.  Data Reviewed: I have personally reviewed following labs and imaging studies  CBC: Recent Labs  Lab 08/26/20 0606 08/27/20 1031 08/31/20 2100 08/31/20 2148 09/01/20 0500  WBC 11.6* 7.3 9.7  --  8.8  NEUTROABS  --   --  5.6  --   --   HGB 8.4* 8.2* 9.3* 9.2* 8.9*  HCT 27.0* 26.1* 30.1* 27.0* 28.5*  MCV 77.6* 77.2* 78.8*  --  78.5*  PLT 155 167 246   --  749   Basic Metabolic Panel: Recent Labs  Lab 08/25/20 1323 08/26/20 0606 08/31/20 2100 08/31/20 2148 09/01/20 0500  NA 144 140 140 140 138  K 4.1 3.9 3.7 4.3 4.0  CL  --  112* 107 108 108  CO2  --  23 25  --  22  GLUCOSE  --  99 90 90 107*  BUN  --  5* 16 17 13   CREATININE  --  0.58 0.86 0.80 0.59  CALCIUM  --  8.7* 9.4  --  9.1  MG  --  1.9  --   --  2.0  PHOS  --  3.6  --   --  3.8   GFR: Estimated Creatinine Clearance: 84.4 mL/min (by C-G formula based on SCr of 0.59 mg/dL). Liver Function Tests: Recent Labs  Lab 08/26/20 0606 08/31/20 2100 09/01/20 0500  AST 13* 15 14*  ALT 11 14 13   ALKPHOS 49 77 66  BILITOT 0.6 0.4 0.5  PROT 5.7* 7.7 7.1  ALBUMIN 3.2* 3.9 3.8   No results for input(s): LIPASE, AMYLASE in the last 168 hours. No results for input(s): AMMONIA in the last 168 hours. Coagulation Profile: Recent Labs  Lab 08/31/20 2100 09/01/20 0500  INR 1.0 1.0   Cardiac Enzymes: No results for input(s): CKTOTAL, CKMB, CKMBINDEX, TROPONINI in the last 168 hours. BNP (last 3 results) No results for input(s): PROBNP in the last 8760 hours. HbA1C: Recent Labs    09/01/20 0500  HGBA1C 5.3   CBG: Recent Labs  Lab 08/31/20 2105  GLUCAP 90   Lipid Profile: Recent Labs    09/01/20 0500  CHOL 198  HDL 51  LDLCALC 131*  TRIG 82  CHOLHDL 3.9   Thyroid Function Tests: No results for input(s): TSH, T4TOTAL, FREET4, T3FREE, THYROIDAB in the last 72 hours. Anemia Panel: Recent Labs    09/01/20 0807  FERRITIN 91  TIBC 221*  IRON 35   Sepsis Labs: No results for input(s): PROCALCITON, LATICACIDVEN in the last 168 hours.  Recent Results (from the past 240 hour(s))  SARS CORONAVIRUS 2 (TAT 6-24 HRS) Nasopharyngeal Nasopharyngeal Swab     Status: Abnormal   Collection Time: 08/24/20  9:03 AM   Specimen: Nasopharyngeal Swab  Result Value Ref Range Status   SARS Coronavirus 2 POSITIVE (A) NEGATIVE Final    Comment: (NOTE) SARS-CoV-2 target  nucleic acids are DETECTED.  The SARS-CoV-2 RNA is generally detectable in upper and lower respiratory specimens during the acute phase of infection. Positive results are indicative of the presence of SARS-CoV-2 RNA. Clinical correlation with patient history and other diagnostic information is  necessary to determine patient infection status. Positive results do not rule out bacterial infection or co-infection with other viruses.  The expected result is Negative.  Fact Sheet for Patients: SugarRoll.be  Fact Sheet for Healthcare Providers: https://www.woods-mathews.com/  This test is not yet approved or cleared by the [place] FDA and  has been authorized for detection and/or diagnosis of  SARS-CoV-2 by FDA under an Emergency Use Authorization (EUA). This EUA will remain  in effect (meaning this test can be used) for the duration of the COVID-19 declaration under Section 564(b)(1) of the Act, 21 U. S.C. section 360bbb-3(b)(1), unless the authorization is terminated or revoked sooner.   Performed at [facility], [place] [address] [place], [place] [address]   SARS Coronavirus 2 by RT PCR (hospital order, performed in [facility] hospital lab) Nasopharyngeal Nasopharyngeal Swab     Status: None   Collection Time: 08/25/20  7:47 AM   Specimen: Nasopharyngeal Swab  Result Value Ref Range Status   SARS Coronavirus 2 NEGATIVE NEGATIVE Final    Comment: (NOTE) SARS-CoV-2 target nucleic acids are NOT DETECTED.  The SARS-CoV-2 RNA is generally detectable in upper and lower respiratory specimens during the acute phase of infection. The lowest concentration of SARS-CoV-2 viral copies this assay can detect is 250 copies / mL. A negative result does not preclude SARS-CoV-2 infection and should not be used as the sole basis for treatment or other patient management decisions.  A negative result may occur with improper specimen collection /  handling, submission of specimen other than nasopharyngeal swab, presence of viral mutation(s) within the areas targeted by this assay, and inadequate number of viral copies (<250 copies / mL). A negative result must be combined with clinical observations, patient history, and epidemiological information.  Fact Sheet for Patients:   StrictlyIdeas.no  Fact Sheet for Healthcare Providers: BankingDealers.co.za  This test is not yet approved or  cleared by the [place] FDA and has been authorized for detection and/or diagnosis of SARS-CoV-2 by FDA under an Emergency Use Authorization (EUA).  This EUA will remain in effect (meaning this test can be used) for the duration of the COVID-19 declaration under Section 564(b)(1) of the Act, 21 U.S.C. section 360bbb-3(b)(1), unless the authorization is terminated or revoked sooner.  Performed at [facility], [place] [address], [place], [place] [address]   MRSA Next Gen by PCR, Nasal     Status: None   Collection Time: 08/25/20  3:28 PM   Specimen: Nasal Mucosa; Nasal Swab  Result Value Ref Range Status   MRSA by PCR Next Gen NOT DETECTED NOT DETECTED Final    Comment: (NOTE) The GeneXpert MRSA Assay (FDA approved for NASAL specimens only), is one component of a comprehensive MRSA colonization surveillance program. It is not intended to diagnose MRSA infection nor to guide or monitor treatment for MRSA infections. Test performance is not FDA approved in patients less than 75 years old. Performed at [facility], [place] [address], [place], [place] [address]          Radiology Studies: MR BRAIN WO CONTRAST  Result Date: 09/01/2020 CLINICAL DATA:  60 year old female with lower extremity tingling. Encephalopathy. Status post coil embolization of anterior communicating artery aneurysm 1 week ago. 8 mm left MCA bifurcation aneurysm. EXAM: MRI HEAD WITHOUT CONTRAST TECHNIQUE:  Multiplanar, multiecho pulse sequences of the brain and surrounding structures were obtained without intravenous contrast. COMPARISON:  CTA head and neck yesterday and earlier. Brain MRI 08/13/2013. FINDINGS: Brain: Confluent restricted diffusion in the right ACA territory (series 5, image 48), but also affecting some cortex of the cingulate gyrus on the left (series 12, image 48). And there is also dense restricted diffusion in the anterior right basal ganglia related to penetrating arteries of the proximal ACA. Posteriorly on the right a small area of medial perirolandic cortex is  also affected (series 5, image 89). Questionable also punctate abnormal diffusion in the right superior cerebellum on series 5, image 68, but not correlated on the coronal images. T2 and FLAIR hyperintensity in keeping with cytotoxic edema. No parenchymal hemorrhage or significant mass effect. No midline shift or ventriculomegaly. Small widely scattered cerebral white matter T2 and FLAIR hyperintense foci are in a similar pattern to the 2015 MRI, mildly progressed since that time. No chronic cortical encephalomalacia or definite chronic cerebral blood products. No midline shift, evidence of mass lesion, extra-axial collection or acute intracranial hemorrhage. Basilar cisterns are patent. Cervicomedullary junction and pituitary are within normal limits. Vascular: Major intracranial vascular flow voids are stable since 2015. T2* susceptibility artifact related to the anterior communicating artery aneurysm stent assisted coiling. Skull and upper cervical spine: Negative. Visualized bone marrow signal is within normal limits. Sinuses/Orbits: Negative. Paranasal Visualized paranasal sinuses and mastoids are stable and well aerated. Other: Negative visible scalp and face soft tissues. IMPRESSION: 1. Confluent Acute Right ACA territory infarct. This includes involvement of the anterior basal ganglia (medial striate artery territory). Ischemia  also in the Left ACA territory but limited to cortex of the cingulate gyrus. 2. Cytotoxic edema but no associated hemorrhage or significant mass effect. Electronically Signed   By: Genevie Ann M.D.   On: 09/01/2020 07:53   CT HEAD CODE STROKE WO CONTRAST  Result Date: 08/31/2020 CLINICAL DATA:  Code stroke. Lower extremity tingling and encephalopathy EXAM: CT HEAD WITHOUT CONTRAST TECHNIQUE: Contiguous axial images were obtained from the base of the skull through the vertex without intravenous contrast. COMPARISON:  Head CT 08/08/2020 FINDINGS: Brain: There is no mass, hemorrhage or extra-axial collection. The size and configuration of the ventricles and extra-axial CSF spaces are normal. The brain parenchyma is normal, without evidence of acute or chronic infarction. Vascular: Aneurysm clips at the A-comm. Skull: The visualized skull base, calvarium and extracranial soft tissues are normal. Sinuses/Orbits: No fluid levels or advanced mucosal thickening of the visualized paranasal sinuses. No mastoid or middle ear effusion. The orbits are normal. ASPECTS [facility] Stroke Program Early CT Score) - Ganglionic level infarction (caudate, lentiform nuclei, internal capsule, insula, M1-M3 cortex): 7 - Supraganglionic infarction (M4-M6 cortex): 3 Total score (0-10 with 10 being normal): 10 IMPRESSION: 1. No acute intracranial abnormality. 2. ASPECTS is 10. These results were called by telephone at the time of interpretation on 08/31/2020 at 10:07 pm to provider [facility] , who verbally acknowledged these results. Electronically Signed   By: Ulyses Jarred M.D.   On: 08/31/2020 22:07   CT ANGIO HEAD CODE STROKE  Result Date: 08/31/2020 CLINICAL DATA:  Lower extremity tingling and encephalopathy. EXAM: CT ANGIOGRAPHY HEAD AND NECK TECHNIQUE: Multidetector CT imaging of the head and neck was performed using the standard protocol during bolus administration of intravenous contrast. Multiplanar CT image reconstructions and  MIPs were obtained to evaluate the vascular anatomy. Carotid stenosis measurements (when applicable) are obtained utilizing NASCET criteria, using the distal internal carotid diameter as the denominator. CONTRAST:  171mL OMNIPAQUE IOHEXOL 350 MG/ML SOLN COMPARISON:  08/08/2020 FINDINGS: CTA NECK FINDINGS SKELETON: There is no bony spinal canal stenosis. No lytic or blastic lesion. OTHER NECK: Normal pharynx, larynx and major salivary glands. No cervical lymphadenopathy. Unremarkable thyroid gland. UPPER CHEST: No pneumothorax or pleural effusion. No nodules or masses. AORTIC ARCH: There is calcific atherosclerosis of the aortic arch. There is no aneurysm, dissection or hemodynamically significant stenosis of the visualized portion of the aorta. Aberrant right subclavian  artery. the visualized proximal subclavian arteries are widely patent. RIGHT CAROTID SYSTEM: Normal without aneurysm, dissection or stenosis. LEFT CAROTID SYSTEM: Normal without aneurysm, dissection or stenosis. VERTEBRAL ARTERIES: Left dominant configuration. Both origins are clearly patent. There is no dissection, occlusion or flow-limiting stenosis to the skull base (V1-V3 segments). CTA HEAD FINDINGS POSTERIOR CIRCULATION: --Vertebral arteries: Normal V4 segments. --Inferior cerebellar arteries: Normal. --Basilar artery: Normal. --Superior cerebellar arteries: Normal. --Posterior cerebral arteries (PCA): Normal. ANTERIOR CIRCULATION: --Intracranial internal carotid arteries: Normal. --Anterior cerebral arteries (ACA): Coil mass at the anterior communicating artery. No visible residual aneurysm filling. --Middle cerebral arteries (MCA): Unchanged 8 mm aneurysm of the left MCA bifurcation. Normal right MCA. No occlusion. VENOUS SINUSES: As permitted by contrast timing, patent. ANATOMIC VARIANTS: None Review of the MIP images confirms the above findings. IMPRESSION: 1. No emergent large vessel occlusion or high-grade stenosis. 2. Unchanged 8 mm left  MCA bifurcation aneurysm. 3. Coil mass at the anterior communicating artery without visible residual aneurysm filling. 4. Aberrant right subclavian artery. Aortic Atherosclerosis (ICD10-I70.0). Electronically Signed   By: Ulyses Jarred M.D.   On: 08/31/2020 22:49   CT ANGIO NECK CODE STROKE  Result Date: 08/31/2020 CLINICAL DATA:  Lower extremity tingling and encephalopathy. EXAM: CT ANGIOGRAPHY HEAD AND NECK TECHNIQUE: Multidetector CT imaging of the head and neck was performed using the standard protocol during bolus administration of intravenous contrast. Multiplanar CT image reconstructions and MIPs were obtained to evaluate the vascular anatomy. Carotid stenosis measurements (when applicable) are obtained utilizing NASCET criteria, using the distal internal carotid diameter as the denominator. CONTRAST:  174mL OMNIPAQUE IOHEXOL 350 MG/ML SOLN COMPARISON:  08/08/2020 FINDINGS: CTA NECK FINDINGS SKELETON: There is no bony spinal canal stenosis. No lytic or blastic lesion. OTHER NECK: Normal pharynx, larynx and major salivary glands. No cervical lymphadenopathy. Unremarkable thyroid gland. UPPER CHEST: No pneumothorax or pleural effusion. No nodules or masses. AORTIC ARCH: There is calcific atherosclerosis of the aortic arch. There is no aneurysm, dissection or hemodynamically significant stenosis of the visualized portion of the aorta. Aberrant right subclavian artery. the visualized proximal subclavian arteries are widely patent. RIGHT CAROTID SYSTEM: Normal without aneurysm, dissection or stenosis. LEFT CAROTID SYSTEM: Normal without aneurysm, dissection or stenosis. VERTEBRAL ARTERIES: Left dominant configuration. Both origins are clearly patent. There is no dissection, occlusion or flow-limiting stenosis to the skull base (V1-V3 segments). CTA HEAD FINDINGS POSTERIOR CIRCULATION: --Vertebral arteries: Normal V4 segments. --Inferior cerebellar arteries: Normal. --Basilar artery: Normal. --Superior  cerebellar arteries: Normal. --Posterior cerebral arteries (PCA): Normal. ANTERIOR CIRCULATION: --Intracranial internal carotid arteries: Normal. --Anterior cerebral arteries (ACA): Coil mass at the anterior communicating artery. No visible residual aneurysm filling. --Middle cerebral arteries (MCA): Unchanged 8 mm aneurysm of the left MCA bifurcation. Normal right MCA. No occlusion. VENOUS SINUSES: As permitted by contrast timing, patent. ANATOMIC VARIANTS: None Review of the MIP images confirms the above findings. IMPRESSION: 1. No emergent large vessel occlusion or high-grade stenosis. 2. 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 3. Coil mass at the anterior communicating artery without visible residual aneurysm filling. 4. Aberrant right subclavian artery. Aortic Atherosclerosis (ICD10-I70.0). Electronically Signed   By: Ulyses Jarred M.D.   On: 08/31/2020 22:49        Scheduled Meds:  aspirin  81 mg Oral Daily   atorvastatin  80 mg Oral QHS   lidocaine  1 patch Transdermal Q24H   ticagrelor  90 mg Oral BID   Continuous Infusions:        Aline August, MD [facility] 09/01/2020, 10:03 AM

## 2020-09-01 NOTE — Evaluation (Signed)
SLP Cancellation Note  Patient Details Name: Betty Archer MRN: [id number] DOB: 11-Sep-1960  Pt passed yale swallow screen and ED nurse from [facility] did not report concerns for swallowing, MD please order Speech and Language evaluation if you agree. Thanks.  Kathleen Lime, MS [facility] SLP Acute Rehab Services Office [phone] Pager [phone]   Macario Golds 09/01/2020, 3:30 PM

## 2020-09-01 NOTE — Evaluation (Addendum)
Physical Therapy Evaluation Patient Details Name: Betty Archer MRN: [id number] DOB: April 02, 1960 Today's Date: 09/01/2020   History of Present Illness  60 y.o. female with medical history significant for hypertension, hyperlipidemia who recently had a repair of right ACA aneurysm, as well as left MCA aneurysm (unrepaired) which resulted in right groin hematoma that required intubation after the procedure with subsequent discharge on 08/27/2020 presented 08/31/20  with altered mental status and leg weakness.  CT -unchanged 8 mm left MCA bifurcation aneurysm.  Teleneurology recommended stroke work-up. MRI-Confluent Acute Right ACA territory infarct. This includes  involvement of the anterior basal ganglia (medial striate artery  territory).  Ischemia also in the Left ACA territory but limited to cortex of the  cingulate gyrus.  2. Cytotoxic edema but no associated hemorrhage or significant mass  effect.  Clinical Impression  The patient is very lethargic, son at bedside. Patient required frequent stimulation to arouse. Patient presents with L hemiparesis with increased Left leg extensor  tone, impaired sensation .  Betty Archer reports that at [place] from [facility] 08/27/20 ,  patient had difficulty with ambulation , held onto walls and furniture, had much difficulty negotiating 13 steps to apartment. Pt admitted with above diagnosis.   Pt currently with functional limitations due to the deficits listed below (see PT Problem List). Pt will benefit from skilled PT to increase their independence and safety with mobility to allow discharge to the venue listed below.        Follow Up Recommendations CIR    Equipment Recommendations       Recommendations for Other Services Rehab consult     Precautions / Restrictions Precautions Precautions: Fall      Mobility  Bed Mobility Overal bed mobility: Needs Assistance Bed Mobility: Rolling;Sidelying to Sit;Sit to Sidelying Rolling: Total assist;+2 for physical  assistance;+2 for safety/equipment Sidelying to sit: +2 for physical assistance;+2 for safety/equipment;Total assist     Sit to sidelying: Total assist;+2 for physical assistance;+2 for safety/equipment General bed mobility comments: patient  does not initialte nor assist with  mobility, total assistance    Transfers                 General transfer comment: NT  Ambulation/Gait                Stairs            Wheelchair Mobility    Modified Rankin (Stroke Patients Only)       Balance Overall balance assessment: Needs assistance Sitting-balance support: Feet supported;Bilateral upper extremity supported Sitting balance-Leahy Scale: Zero                                       Pertinent Vitals/Pain Pain Assessment: Faces Faces Pain Scale: Hurts little more Pain Location: head Pain Descriptors / Indicators: Headache    Home Living Family/patient expects to be discharged to:: Private residence Living Arrangements: Children Available Help at Discharge: Family;Available PRN/intermittently Type of Home: Apartment Home Access: Stairs to enter Entrance Stairs-Rails: Right Entrance Stairs-Number of Steps: 15+ Home Layout: One level Home Equipment: None      Prior Function Level of Independence: Needs assistance   Gait / Transfers Assistance Needed: per son, since DC from [facility], cruised on walls and furniture, required assistance up steps, taking sponge baths           Hand Dominance   Dominant Hand: Right  Extremity/Trunk Assessment        Lower Extremity Assessment Lower Extremity Assessment: RLE deficits/detail;LLE deficits/detail RLE Deficits / Details: grossly 4 strength, decreased effort, lethargic LLE Deficits / Details: increased tone into extension when rolling, moving to bed edge.  no dorsiflexion noted, trace knee extension LLE Sensation: decreased proprioception;decreased light touch LLE Coordination: decreased  gross motor;decreased fine motor    Cervical / Trunk Assessment Cervical / Trunk Assessment: Other exceptions Cervical / Trunk Exceptions: listing to right, no balance reactions,  Communication   Communication: No difficulties  Cognition Arousal/Alertness: Lethargic Behavior During Therapy: Flat affect Overall Cognitive Status: Impaired/Different from baseline Area of Impairment: Orientation;Attention;Following commands;Awareness;Problem solving                 Orientation Level: Time;Situation Current Attention Level: Focused   Following Commands: Follows one step commands inconsistently   Awareness: Intellectual Problem Solving: Decreased initiation;Slow processing;Difficulty sequencing;Requires verbal cues;Requires tactile cues General Comments: patient intermittiently not aroused, stimulation to awaken,.      General Comments      Exercises     Assessment/Plan    PT Assessment Patient needs continued PT services  PT Problem List Decreased strength;Decreased balance;Decreased cognition;Decreased knowledge of precautions;Impaired tone;Decreased mobility;Decreased knowledge of use of DME;Decreased activity tolerance;Decreased coordination;Decreased safety awareness;Impaired sensation       PT Treatment Interventions DME instruction;Therapeutic activities;Therapeutic exercise;Patient/family education;Balance training;Functional mobility training;Neuromuscular re-education    PT Goals (Current goals can be found in the Care Plan section)  Acute Rehab PT Goals Patient Stated Goal: per  son, to be able to walk PT Goal Formulation: With patient/family Time For Goal Achievement: 09/15/20 Potential to Achieve Goals: Fair    Frequency Min 3X/week   Barriers to discharge        Co-evaluation               AM-PAC PT "6 Clicks" Mobility  Outcome Measure Help needed turning from your back to your side while in a flat bed without using bedrails?: Total Help  needed moving from lying on your back to sitting on the side of a flat bed without using bedrails?: Total Help needed moving to and from a bed to a chair (including a wheelchair)?: Total Help needed standing up from a chair using your arms (e.g., wheelchair or bedside chair)?: Total Help needed to walk in hospital room?: Total Help needed climbing 3-5 steps with a railing? : Total 6 Click Score: 6    End of Session   Activity Tolerance: Patient limited by lethargy Patient left: in bed;with call bell/phone within reach;with nursing/sitter in room Nurse Communication: Mobility status PT Visit Diagnosis: Unsteadiness on feet (R26.81);Difficulty in walking, not elsewhere classified (R26.2);Other symptoms and signs involving the nervous system (R29.898);Hemiplegia and hemiparesis Hemiplegia - Right/Left: Left Hemiplegia - dominant/non-dominant: Non-dominant Hemiplegia - caused by: Cerebral infarction    Time: 2536-6440 PT Time Calculation (min) (ACUTE ONLY): 23 min   Charges:   PT Evaluation $PT Eval mod Complexity: 63mod          Vega Withrow PT Acute Rehabilitation Services Pager [phone] Office [phone]   Claretha Cooper 09/01/2020, 12:34 PM

## 2020-09-01 NOTE — Progress Notes (Signed)
Pt noted to have positive COVID on 08/24/20, then negative 09/01/20. Dr. Tonie Griffith informed and this RN requested a confirmation on pt's isolation status.

## 2020-09-01 NOTE — ED Notes (Signed)
Patient transported to MRI 

## 2020-09-01 NOTE — Progress Notes (Signed)
Rehab Admissions Coordinator Note:  Patient was screened by Cleatrice Burke for appropriateness for an Inpatient Acute Rehab Consult per therapy recs.  At this time, we are recommending Inpatient Rehab consult. I will place order per protocol.  Cleatrice Burke RN MSN 09/01/2020, 2:24 PM  I can be reached at [phone].

## 2020-09-01 NOTE — Progress Notes (Addendum)
Referring Physician(s): Donnetta Simpers  Supervising Physician: Pedro Earls  Patient Status:  [facility] - In-pt  Chief Complaint:  Weakness on left leg patient is s/p successful endovascular embolization of a 7 mm irregularly-shaped wide neck anterior communicating artery aneurysm with stent assisted coiling using "Y" stenting technique with Dr. Karenann Cai on 03/25/53, case was complicated by periprocedural bleeding from the puncture site, bleeding was treated with extended compression.   Patient was discharged home in stable condition with ASA 81 mg daily and Brilinta 90 mg twice daily.   Subjective:  Dr. Karenann Cai was asked to follow-up with the patient during this hospitalization.  Patient laying in bed, not in acute distress appears to be lethargic.  Patient's son at bedside.  Patient kept falling asleep during assessment, history was obtained from the son and from chart. Son states that patient has been not having that much of appetite for about 3 weeks, she appears that she lost weight to him. Son states that patient was able to walk around the house with small breaks in between after she was discharged on 08/27/2020, but she appeared that she did not have much energy in general. Yesterday, patient appeared in her normal state of health till around 5 PM when the son had to go to work, at around 11 PM when the son checked his phone during his break, he found out that his mother was brought to [place] long ED.  Son states that his mother his younger brother and stated that " it is happening, I cannot feel my leg."  The younger brother brought his mother to the [facility] ED. code stroke was initiated and patient underwent CT angio head and neck which showed:  1. No emergent large vessel occlusion or high-grade stenosis. 2. 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 3. Coil mass at the anterior communicating artery without visible residual aneurysm  filling. 4. Aberrant right subclavian artery.  Neurology was consulted and patient was transferred to [facility], currently hospitalized. Patient underwent MR brain on 09/01/2020 which showed:  1. Confluent Acute Right ACA territory infarct. This includes involvement of the anterior basal ganglia (medial striate artery territory). Ischemia also in the Left ACA territory but limited to cortex of the cingulate gyrus. 2. Cytotoxic edema but no associated hemorrhage or significant mass effect.   Allergies: Patient has no known allergies.  Medications: Prior to Admission medications   Medication Sig Start Date End Date Taking? Authorizing Provider  aspirin 81 MG chewable tablet Chew 81 mg by mouth in the morning and at bedtime.   Yes [provider]  docusate sodium (COLACE) 100 MG capsule Take 1 capsule (100 mg total) by mouth 2 (two) times daily. Patient taking differently: Take 100 mg by mouth 2 (two) times daily as needed for mild constipation. 08/26/20  Yes Docia Barrier, PA  ferrous sulfate 325 (65 FE) MG tablet Take 1 tablet (325 mg total) by mouth daily with breakfast. Patient taking differently: Take 325 mg by mouth daily. 08/26/20  Yes Docia Barrier, PA  ticagrelor (BRILINTA) 90 MG TABS tablet Take 90 mg by mouth 2 (two) times daily.   Yes [provider]     Vital Signs: BP 120/68 (BP Location: Right Arm)   Pulse (!) 54   Temp 98.5 F (36.9 C) (Oral)   Resp 19   Ht 5\' 6"  (1.676 m)   Wt 197 lb 15.6 oz (89.8 kg)   SpO2 100%  BMI 31.95 kg/m   Physical Exam Vitals reviewed.  Constitutional:      General: She is not in acute distress.    Appearance: She is not ill-appearing.     Comments: Appears lethargic, keeps falling asleep during assessment.  HENT:     Head: Normocephalic and atraumatic.  Pulmonary:     Effort: Pulmonary effort is normal.  Skin:    General: Skin is warm and dry.     Coloration: Skin is not jaundiced or pale.   Neurological:     Mental Status: She is disoriented.     Motor: Weakness present.     Comments: Alert and oriented to self and place only. Do not fully understand the situation.  Patient states that she came to the hospital due to RIGHT leg weakness, however son states that she has some weakness on her LEFT leg.  Able to follow simple command Generalized weakness on upper and lower extremities, able to move bilateral upper extremities and right leg, not able to move left leg.    Imaging: MR BRAIN WO CONTRAST  Result Date: 09/01/2020 CLINICAL DATA:  60 year old female with lower extremity tingling. Encephalopathy. Status post coil embolization of anterior communicating artery aneurysm 1 week ago. 8 mm left MCA bifurcation aneurysm. EXAM: MRI HEAD WITHOUT CONTRAST TECHNIQUE: Multiplanar, multiecho pulse sequences of the brain and surrounding structures were obtained without intravenous contrast. COMPARISON:  CTA head and neck yesterday and earlier. Brain MRI 08/13/2013. FINDINGS: Brain: Confluent restricted diffusion in the right ACA territory (series 5, image 38), but also affecting some cortex of the cingulate gyrus on the left (series 12, image 48). And there is also dense restricted diffusion in the anterior right basal ganglia related to penetrating arteries of the proximal ACA. Posteriorly on the right a small area of medial perirolandic cortex is also affected (series 5, image 89). Questionable also punctate abnormal diffusion in the right superior cerebellum on series 5, image 68, but not correlated on the coronal images. T2 and FLAIR hyperintensity in keeping with cytotoxic edema. No parenchymal hemorrhage or significant mass effect. No midline shift or ventriculomegaly. Small widely scattered cerebral white matter T2 and FLAIR hyperintense foci are in a similar pattern to the 2015 MRI, mildly progressed since that time. No chronic cortical encephalomalacia or definite chronic cerebral blood  products. No midline shift, evidence of mass lesion, extra-axial collection or acute intracranial hemorrhage. Basilar cisterns are patent. Cervicomedullary junction and pituitary are within normal limits. Vascular: Major intracranial vascular flow voids are stable since 2015. T2* susceptibility artifact related to the anterior communicating artery aneurysm stent assisted coiling. Skull and upper cervical spine: Negative. Visualized bone marrow signal is within normal limits. Sinuses/Orbits: Negative. Paranasal Visualized paranasal sinuses and mastoids are stable and well aerated. Other: Negative visible scalp and face soft tissues. IMPRESSION: 1. Confluent Acute Right ACA territory infarct. This includes involvement of the anterior basal ganglia (medial striate artery territory). Ischemia also in the Left ACA territory but limited to cortex of the cingulate gyrus. 2. Cytotoxic edema but no associated hemorrhage or significant mass effect. Electronically Signed   By: Genevie Ann M.D.   On: 09/01/2020 07:53   CT HEAD CODE STROKE WO CONTRAST  Result Date: 08/31/2020 CLINICAL DATA:  Code stroke. Lower extremity tingling and encephalopathy EXAM: CT HEAD WITHOUT CONTRAST TECHNIQUE: Contiguous axial images were obtained from the base of the skull through the vertex without intravenous contrast. COMPARISON:  Head CT 08/08/2020 FINDINGS: Brain: There is no mass, hemorrhage or  extra-axial collection. The size and configuration of the ventricles and extra-axial CSF spaces are normal. The brain parenchyma is normal, without evidence of acute or chronic infarction. Vascular: Aneurysm clips at the A-comm. Skull: The visualized skull base, calvarium and extracranial soft tissues are normal. Sinuses/Orbits: No fluid levels or advanced mucosal thickening of the visualized paranasal sinuses. No mastoid or middle ear effusion. The orbits are normal. ASPECTS [facility] Stroke Program Early CT Score) - Ganglionic level infarction  (caudate, lentiform nuclei, internal capsule, insula, M1-M3 cortex): 7 - Supraganglionic infarction (M4-M6 cortex): 3 Total score (0-10 with 10 being normal): 10 IMPRESSION: 1. No acute intracranial abnormality. 2. ASPECTS is 10. These results were called by telephone at the time of interpretation on 08/31/2020 at 10:07 pm to provider [facility] , who verbally acknowledged these results. Electronically Signed   By: Ulyses Jarred M.D.   On: 08/31/2020 22:07   CT ANGIO HEAD CODE STROKE  Result Date: 08/31/2020 CLINICAL DATA:  Lower extremity tingling and encephalopathy. EXAM: CT ANGIOGRAPHY HEAD AND NECK TECHNIQUE: Multidetector CT imaging of the head and neck was performed using the standard protocol during bolus administration of intravenous contrast. Multiplanar CT image reconstructions and MIPs were obtained to evaluate the vascular anatomy. Carotid stenosis measurements (when applicable) are obtained utilizing NASCET criteria, using the distal internal carotid diameter as the denominator. CONTRAST:  157mL OMNIPAQUE IOHEXOL 350 MG/ML SOLN COMPARISON:  08/08/2020 FINDINGS: CTA NECK FINDINGS SKELETON: There is no bony spinal canal stenosis. No lytic or blastic lesion. OTHER NECK: Normal pharynx, larynx and major salivary glands. No cervical lymphadenopathy. Unremarkable thyroid gland. UPPER CHEST: No pneumothorax or pleural effusion. No nodules or masses. AORTIC ARCH: There is calcific atherosclerosis of the aortic arch. There is no aneurysm, dissection or hemodynamically significant stenosis of the visualized portion of the aorta. Aberrant right subclavian artery. the visualized proximal subclavian arteries are widely patent. RIGHT CAROTID SYSTEM: Normal without aneurysm, dissection or stenosis. LEFT CAROTID SYSTEM: Normal without aneurysm, dissection or stenosis. VERTEBRAL ARTERIES: Left dominant configuration. Both origins are clearly patent. There is no dissection, occlusion or flow-limiting stenosis to the  skull base (V1-V3 segments). CTA HEAD FINDINGS POSTERIOR CIRCULATION: --Vertebral arteries: Normal V4 segments. --Inferior cerebellar arteries: Normal. --Basilar artery: Normal. --Superior cerebellar arteries: Normal. --Posterior cerebral arteries (PCA): Normal. ANTERIOR CIRCULATION: --Intracranial internal carotid arteries: Normal. --Anterior cerebral arteries (ACA): Coil mass at the anterior communicating artery. No visible residual aneurysm filling. --Middle cerebral arteries (MCA): Unchanged 8 mm aneurysm of the left MCA bifurcation. Normal right MCA. No occlusion. VENOUS SINUSES: As permitted by contrast timing, patent. ANATOMIC VARIANTS: None Review of the MIP images confirms the above findings. IMPRESSION: 1. No emergent large vessel occlusion or high-grade stenosis. 2. 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 3. Coil mass at the anterior communicating artery without visible residual aneurysm filling. 4. Aberrant right subclavian artery. Aortic Atherosclerosis (ICD10-I70.0). Electronically Signed   By: Ulyses Jarred M.D.   On: 08/31/2020 22:49   CT ANGIO NECK CODE STROKE  Result Date: 08/31/2020 CLINICAL DATA:  Lower extremity tingling and encephalopathy. EXAM: CT ANGIOGRAPHY HEAD AND NECK TECHNIQUE: Multidetector CT imaging of the head and neck was performed using the standard protocol during bolus administration of intravenous contrast. Multiplanar CT image reconstructions and MIPs were obtained to evaluate the vascular anatomy. Carotid stenosis measurements (when applicable) are obtained utilizing NASCET criteria, using the distal internal carotid diameter as the denominator. CONTRAST:  147mL OMNIPAQUE IOHEXOL 350 MG/ML SOLN COMPARISON:  08/08/2020 FINDINGS: CTA NECK FINDINGS SKELETON:  There is no bony spinal canal stenosis. No lytic or blastic lesion. OTHER NECK: Normal pharynx, larynx and major salivary glands. No cervical lymphadenopathy. Unremarkable thyroid gland. UPPER CHEST: No pneumothorax  or pleural effusion. No nodules or masses. AORTIC ARCH: There is calcific atherosclerosis of the aortic arch. There is no aneurysm, dissection or hemodynamically significant stenosis of the visualized portion of the aorta. Aberrant right subclavian artery. the visualized proximal subclavian arteries are widely patent. RIGHT CAROTID SYSTEM: Normal without aneurysm, dissection or stenosis. LEFT CAROTID SYSTEM: Normal without aneurysm, dissection or stenosis. VERTEBRAL ARTERIES: Left dominant configuration. Both origins are clearly patent. There is no dissection, occlusion or flow-limiting stenosis to the skull base (V1-V3 segments). CTA HEAD FINDINGS POSTERIOR CIRCULATION: --Vertebral arteries: Normal V4 segments. --Inferior cerebellar arteries: Normal. --Basilar artery: Normal. --Superior cerebellar arteries: Normal. --Posterior cerebral arteries (PCA): Normal. ANTERIOR CIRCULATION: --Intracranial internal carotid arteries: Normal. --Anterior cerebral arteries (ACA): Coil mass at the anterior communicating artery. No visible residual aneurysm filling. --Middle cerebral arteries (MCA): Unchanged 8 mm aneurysm of the left MCA bifurcation. Normal right MCA. No occlusion. VENOUS SINUSES: As permitted by contrast timing, patent. ANATOMIC VARIANTS: None Review of the MIP images confirms the above findings. IMPRESSION: 1. No emergent large vessel occlusion or high-grade stenosis. 2. 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 3. Coil mass at the anterior communicating artery without visible residual aneurysm filling. 4. Aberrant right subclavian artery. Aortic Atherosclerosis (ICD10-I70.0). Electronically Signed   By: Ulyses Jarred M.D.   On: 08/31/2020 22:49    Labs:  CBC: Recent Labs    08/26/20 0606 08/27/20 1031 08/31/20 2100 08/31/20 2148 09/01/20 0500  WBC 11.6* 7.3 9.7  --  8.8  HGB 8.4* 8.2* 9.3* 9.2* 8.9*  HCT 27.0* 26.1* 30.1* 27.0* 28.5*  PLT 155 167 246  --  220    COAGS: Recent Labs     08/25/20 0630 08/31/20 2100 09/01/20 0500  INR 1.0 1.0 1.0  APTT 31 33 33    BMP: Recent Labs    08/25/20 0630 08/25/20 1139 08/26/20 0606 08/31/20 2100 08/31/20 2148 09/01/20 0500  NA 141   < > 140 140 140 138  K 3.8   < > 3.9 3.7 4.3 4.0  CL 112*  --  112* 107 108 108  CO2 23  --  23 25  --  22  GLUCOSE 91  --  99 90 90 107*  BUN 10  --  5* 16 17 13   CALCIUM 9.1  --  8.7* 9.4  --  9.1  CREATININE 0.80  --  0.58 0.86 0.80 0.59  GFRNONAA >60  --  >60 >60  --  >60   < > = values in this interval not displayed.    LIVER FUNCTION TESTS: Recent Labs    08/25/20 0630 08/26/20 0606 08/31/20 2100 09/01/20 0500  BILITOT 0.6 0.6 0.4 0.5  AST 15 13* 15 14*  ALT 16 11 14 13   ALKPHOS 60 49 77 66  PROT 6.7 5.7* 7.7 7.1  ALBUMIN 3.4* 3.2* 3.9 3.8    Assessment and Plan:  60 yo female s/p successful endovascular embolization of a 7 mm irregularly-shaped wide neck anterior communicating artery aneurysm with stent assisted coiling using "Y" stenting technique with Dr. Karenann Cai on 09/21/40,  case was complicated by periprocedural bleeding from the puncture site, bleeding was treated with extended compression.  Who was brought to [facility] ED due to left leg weakness and AMS, was not found to have acute  right ACA territory infarct.  Patient is currently hospitalized at [facility].   Imaging reviewed by Dr. Karenann Cai, the acute infarct most likely due to development of in-stent thrombosis.   Will obtain P2Y12 to evaluate if the patient is responsive to Brilinta properly. Recommends PT evaluation for left-sided weakness. Continue to take ASA 81 mg daily and Brilinta 90 mg twice daily.  Further treatment plan per TRH/Nerology  Appreciate and agree with the plan.  NIR to follow.    Electronically Signed: Tera Mater, PA-C 09/01/2020, 4:04 PM   I spent a total of 35 Minutes at the the patient's bedside AND on the patient's hospital floor or unit, greater than 50% of which  was counseling/coordinating care for possible in-stent thrombosis

## 2020-09-01 NOTE — Progress Notes (Signed)
Occupational Therapy Evaluation   Patient's son lives with her in an apartment on 2nd floor with 15+ steps to enter. At baseline patient was independent with ADLs tasks and mobility, since recent aneurysm repair at [facility] patient's son having to help her up the steps and patient holding onto walls to steady herself. Patient now with significant functional limitations including L sided weakness and what appears some left inattention although difficult to assess due to patient arousal level and overall impaired direction following. Patient needing increased stimuli initiate tasks on L side with limited follow through compared to R UE. Patient also does not initiate bed mobility despite multimodal cues needing total A x2. Attempt to facilitate trunk seated edge of bed in order for patient to initiate sitting upright more independently however does not respond or try to correct balance despite cues therefore needing total A to maintain upright seated position. Patient is going to need comprehensive rehabilitation at D/C, acute OT to follow.     09/01/20 1234  OT Visit Information  Last OT Received On 09/01/20  Assistance Needed +2  PT/OT/SLP Co-Evaluation/Treatment Yes  Reason for Co-Treatment To address functional/ADL transfers;For patient/therapist safety;Complexity of the patient's impairments (multi-system involvement)  PT goals addressed during session Mobility/safety with mobility  OT goals addressed during session ADL's and self-care  History of Present Illness 60 y.o. female with medical history significant for hypertension, hyperlipidemia who recently had a repair of right ACA aneurysm, as well as left MCA aneurysm (unrepaired) which resulted in right groin hematoma that required intubation after the procedure with subsequent discharge on 08/27/2020 presented 08/31/20  with altered mental status and leg weakness.  CT -unchanged 8 mm left MCA bifurcation aneurysm.  Teleneurology recommended stroke work-up.  MRI-Confluent Acute Right ACA territory infarct. This includes  involvement of the anterior basal ganglia (medial striate artery  territory).  Ischemia also in the Left ACA territory but limited to cortex of the  cingulate gyrus.  2. Cytotoxic edema but no associated hemorrhage or significant mass  effect.  Precautions  Precautions Fall  Restrictions  Weight Bearing Restrictions No  Home Living  Family/patient expects to be discharged to: Private residence  Living Arrangements Children  Available Help at Discharge Family;Available PRN/intermittently  Type of Home Apartment  Home Access Stairs to enter  Entrance Stairs-Number of Steps 15+  Entrance Stairs-Rails Right  Home Layout One level  Bathroom Shower/Tub Tub/shower unit;Walk-in Engineer, materials None  Additional Comments son has been living with patient for past year  Prior Function  Level of Independence Needs assistance  Gait / Transfers Assistance Needed per son, since DC from [facility] [place] on walls and furniture, required assistance up steps, taking sponge baths  ADL's / Homemaking Assistance Needed since [facility] sponge bathing and otherwise mostly independent with self care tasks  Communication  Communication No difficulties (soft spoken)  Pain Assessment  Pain Assessment Faces  Faces Pain Scale 4  Pain Location head  Pain Descriptors / Indicators Headache  Pain Intervention(s) Monitored during session  Cognition  Arousal/Alertness Lethargic  Behavior During Therapy Flat affect  Overall Cognitive Status Impaired/Different from baseline  Area of Impairment Orientation;Attention;Following commands;Awareness;Problem solving  Orientation Level Disoriented to;Time;Situation  Current Attention Level Focused  Following Commands Follows one step commands inconsistently  Awareness Intellectual  Problem Solving Decreased initiation;Slow processing;Difficulty sequencing;Requires verbal  cues;Requires tactile cues  General Comments difficult to assess due to inconsistent direction following however appears patient has some left inattention  and following through less with L UE directions vs R such as during coordination assessment  Upper Extremity Assessment  Upper Extremity Assessment LUE deficits/detail  LUE Deficits / Details grip trace compared to R grip 3+/5, unable to support L UE against gravity without assistance.  LUE Coordination decreased fine motor;decreased gross motor  Lower Extremity Assessment  Lower Extremity Assessment Defer to PT evaluation  Cervical / Trunk Assessment  Cervical / Trunk Assessment Other exceptions  Cervical / Trunk Exceptions listing to left, no balance reactions  ADL  Overall ADL's  Needs assistance/impaired  Grooming Moderate assistance;Bed level  Upper Body Bathing Maximal assistance;Bed level  Lower Body Bathing Total assistance;Bed level  Upper Body Dressing  Maximal assistance;Bed level  Lower Body Dressing Total assistance;Bed level  Toilet Transfer Details (indicate cue type and reason) deferred due to zero sitting balance needing mod to max A to maintain upright position at edge of bed  Toileting- Clothing Manipulation and Hygiene Total assistance;Bed level  General ADL Comments patient requiring significant assistance with all ADL tasks due to decreased strength, ROM, coordination, cognition, balance  Bed Mobility  Overal bed mobility Needs Assistance  Bed Mobility Rolling;Sidelying to Sit;Sit to Sidelying  Rolling Total assist;+2 for physical assistance;+2 for safety/equipment  Sidelying to sit +2 for physical assistance;+2 for safety/equipment;Total assist  Sit to sidelying Total assist;+2 for physical assistance;+2 for safety/equipment  General bed mobility comments patient does not initiate despite max multimodal cues for bed mobility  Transfers  General transfer comment deferred as patient with zero sitting balance at  edge of bed  Balance  Overall balance assessment Needs assistance  Sitting-balance support Feet supported;Bilateral upper extremity supported  Sitting balance-Leahy Scale Zero  Sitting balance - Comments significant posterior lean, does not initiate pulling forward on PT to try and correct balance while OT providing total A to sit upright  OT - End of Session  Activity Tolerance Patient limited by fatigue;Other (comment) (Patient limited 2* cognition)  Patient left in bed;with call bell/phone within reach;with bed alarm set;with nursing/sitter in room  Nurse Communication Mobility status  OT Assessment  OT Recommendation/Assessment Patient needs continued OT Services  OT Visit Diagnosis Other abnormalities of gait and mobility (R26.89);Muscle weakness (generalized) (M62.81);Other symptoms and signs involving cognitive function;Other symptoms and signs involving the nervous system (R29.898);Hemiplegia and hemiparesis  Hemiplegia - Right/Left Left  Hemiplegia - dominant/non-dominant Non-Dominant  Hemiplegia - caused by Cerebral infarction  OT Problem List Decreased strength;Decreased range of motion;Decreased activity tolerance;Impaired balance (sitting and/or standing);Decreased coordination;Decreased cognition;Decreased safety awareness;Decreased knowledge of use of DME or AE;Decreased knowledge of precautions;Impaired UE functional use;Pain  OT Plan  OT Frequency (ACUTE ONLY) Min 2X/week  OT Treatment/Interventions (ACUTE ONLY) Self-care/ADL training;Therapeutic exercise;Neuromuscular education;DME and/or AE instruction;Therapeutic activities;Cognitive remediation/compensation;Patient/family education;Balance training  AM-PAC OT "6 Clicks" Daily Activity Outcome Measure (Version 2)  Help from another person eating meals? 2  Help from another person taking care of personal grooming? 2  Help from another person toileting, which includes using toliet, bedpan, or urinal? 1  Help from another  person bathing (including washing, rinsing, drying)? 2  Help from another person to put on and taking off regular upper body clothing? 2  Help from another person to put on and taking off regular lower body clothing? 1  6 Click Score 10  Progressive Mobility  What is the highest level of mobility based on the progressive mobility assessment? Level 1 (Bedfast) - Unable to balance while sitting on edge of bed  Mobility Sit up  in bed/chair position for meals  OT Recommendation  Recommendations for Other Services Rehab consult  Follow Up Recommendations CIR  OT Equipment Other (comment) (defer to next venue/TBD)  Individuals Consulted  Consulted and Agree with Results and Recommendations Family member/caregiver  Family Member Consulted son  Acute Rehab OT Goals  Patient Stated Goal per son, to be able to walk  OT Goal Formulation With family  Time For Goal Achievement 09/15/20  Potential to Achieve Goals Good  OT Time Calculation  OT Start Time (ACUTE ONLY) 1101  OT Stop Time (ACUTE ONLY) 1124  OT Time Calculation (min) 23 min  OT General Charges  $OT Visit 1 Visit  OT Evaluation  $OT Eval Moderate Complexity 1 Mod  Written Expression  Dominant Hand Right   Delbert Phenix OT OT pager: [phone]

## 2020-09-01 NOTE — ED Notes (Signed)
Pt transferred to hospital bed for more comfort while awaiting admission bed

## 2020-09-01 NOTE — Progress Notes (Signed)
New admit from [facility] ED. Dr. Erlinda Hong paged about arrival.

## 2020-09-01 NOTE — Consult Note (Signed)
NEUROLOGY CONSULTATION NOTE   Date of service: September 01, 2020 Patient Name: Betty Archer MRN:  [id number] DOB:  05/06/1960 Reason for consult: "ACA stroke" Requesting Provider: Aline August, MD _ _ _   _ __   _ __ _ _  __ __   _ __   __ _  History of Present Illness  Betty Archer is a 60 y.o. female with PMH significant for HLD, recent 70mm R ACA aneuysm embolization and coiling, known unrepaired 28mm left MCA aneurysm who presented with AMS and Leg weakness with left leg worse than R leg. Lkw of 1700 on 08/31/20. She was sitting and doing nothing when this started.  CTH was negative for a large hypodensity concerning for a large territory infarct or hyperdensity concerning for an ICH.  CTA of head and neck showed no emergent large vessel occlusion or high-grade stenosis and unchanged 8 mm left MCA bifurcation aneurysm.  She was outside tPA window at presentation and not a candidate fo rthrombectomy due to no LVO.  MRI Brain was obtained and was notable for confluent Right ACA infarct along with small L ACA cortical infarct.   mRS: 0 tPA: outside window Thrombectomy: No LVO NIHSS components Score: Comment  1a Level of Conscious 0[]  1[x]  2[]  3[]      1b LOC Questions 0[x]  1[]  2[]       1c LOC Commands 0[x]  1[]  2[]       2 Best Gaze 0[x]  1[]  2[]       3 Visual 0[x]  1[]  2[]  3[]      4 Facial Palsy 0[x]  1[]  2[]  3[]      5a Motor Arm - left 0[x]  1[]  2[]  3[]  4[]  UN[]    5b Motor Arm - Right 0[x]  1[]  2[]  3[]  4[]  UN[]    6a Motor Leg - Left 0[]  1[]  2[]  3[]  4[x]  UN[]    6b Motor Leg - Right 0[]  1[x]  2[]  3[]  4[]  UN[]    7 Limb Ataxia 0[x]  1[]  2[]  3[]  UN[]     8 Sensory 0[x]  1[]  2[]  UN[]      9 Best Language 0[x]  1[]  2[]  3[]      10 Dysarthria 0[x]  1[]  2[]  UN[]      11 Extinct. and Inattention 0[x]  1[]  2[]       TOTAL: 6    ROS   Constitutional Denies weight loss, fever and chills.   HEENT Denies changes in vision and hearing.   Respiratory Denies SOB and cough.   CV Denies palpitations and CP    GI Denies abdominal pain, nausea, vomiting and diarrhea.   GU Denies dysuria and urinary frequency.   MSK Denies myalgia and joint pain.   Skin Denies rash and pruritus.   Neurological Denies headache and syncope.   Psychiatric Denies recent changes in mood. Denies anxiety and depression.    Past History   Past Medical History:  Diagnosis Date  . Hyperlipidemia    Past Surgical History:  Procedure Laterality Date  . BREAST BIOPSY      Core biopsy done on April 2003  . COLONOSCOPY    . IR 3D INDEPENDENT WKST  08/25/2020  . IR 3D INDEPENDENT WKST  08/25/2020  . IR ANGIO INTRA EXTRACRAN SEL INTERNAL CAROTID BILAT MOD SED  08/25/2020  . IR ANGIO VERTEBRAL SEL VERTEBRAL BILAT MOD SED  08/25/2020  . IR CT HEAD LTD  08/25/2020  . IR INTRA CRAN STENT  08/25/2020  . IR TRANSCATH/EMBOLIZ  08/25/2020  . IR US GUIDE VASC ACCESS RIGHT  08/25/2020  . PARTIAL HYSTERECTOMY  10/22/1999    Still has cervix  . RADIOLOGY WITH ANESTHESIA N/A 08/25/2020   Procedure: IR WITH ANESTHESIA EMBOLIZATION;  Surgeon: Pedro Earls, MD;  Location: [place];  Service: Radiology;  Laterality: N/A;  . TRIGGER FINGER RELEASE Left 12/25/2013   Procedure: LEFT THUMB TRIGGER RELEASE ;  Surgeon: Leanora Cover, MD;  Location: [place];  Service: Orthopedics;  Laterality: Left;   Family History  Problem Relation Age of Onset  . Breast cancer Maternal Grandmother   . Breast cancer Maternal Aunt   . Hypertension Mother   . Lung cancer Father   . Colon cancer Neg Hx   . Esophageal cancer Neg Hx   . Stomach cancer Neg Hx    Social History   Socioeconomic History  . Marital status: Single    Spouse name: Not on file  . Number of children: Not on file  . Years of education: Not on file  . Highest education level: Not on file  Occupational History  . 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 Smoking status: Every Day    Packs/day: 0.50    Years: 27.00     Pack years: 13.50    Types: Cigarettes  . Smokeless tobacco: Never  Vaping Use  . Vaping Use: Former  Substance and Sexual Activity  . Alcohol use: Yes    Alcohol/week: 0.0 standard drinks    Comment:  patient is a social drinker  . Drug use: No  . Sexual activity: Not on file  Other Topics Concern  . 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   Social Determinants of Health   Financial Resource Strain: Not on file  Food Insecurity: Not on file  Transportation Needs: Not on file  Physical Activity: Not on file  Stress: Not on file  Social Connections: Not on file   No Known Allergies  Medications  (Not in a hospital admission)    Vitals   Vitals:   09/01/20 0130 09/01/20 0500 09/01/20 0800 09/01/20 0830  BP: 113/83 (!) 129/97 111/69 121/61  Pulse: 61 84 67 (!) 58  Resp: 20 (!) 22 (!) 22 14  Temp:      SpO2: 100% 100% 99% 98%  Weight:      Height:         Body mass index is 31.95 kg/m.  Physical Exam   General: Laying comfortably in bed; in no acute distress.  HENT: Normal oropharynx and mucosa. Normal external appearance of ears and nose.  Neck: Supple, no pain or tenderness  CV: No JVD. No peripheral edema.  Pulmonary: Symmetric Chest rise. Normal respiratory effort.  Abdomen: Soft to touch, non-tender.  Ext: No cyanosis, edema, or deformity Skin: No rash. Normal palpation of skin.   Musculoskeletal: Normal digits and nails by inspection. No clubbing.   Neurologic Examination  Mental status/Cognition: somnolent, opens eyes to voice but requires a lot of verbal and tactile stimuli to get her to participate with exam, oriented to self, place, month and year, good attention.  Speech/language: Fluent, comprehension intact, object naming intact, repetition intact.  Cranial nerves:   CN II Pupils equal and reactive to light, no VF deficits    CN III,IV,VI EOM  intact, no gaze preference or deviation, no nystagmus  CN V normal sensation in V1, V2, and V3 segments bilaterally    CN VII no asymmetry, no nasolabial fold flattening    CN VIII normal hearing to speech    CN IX & X normal palatal elevation, no uvular deviation    CN XI 5/5 head turn and 5/5 shoulder shrug bilaterally    CN XII midline tongue protrusion    Motor:  Muscle bulk: normal, tone flaccid in LLE, pronator drift none tremor none Mvmt Root Nerve  Muscle Right Left Comments  SA C5/6 Ax Deltoid     EF C5/6 Mc Biceps 5 5   EE C6/7/8 Rad Triceps 5 5   WF C6/7 Med FCR     WE C7/8 PIN ECU     F Ab C8/T1 U ADM/FDI 5 5   HF L1/2/3 Fem Illopsoas 3 0   KE L2/3/4 Fem Quad 4 0   DF L4/5 D Peron Tib Ant 4 0   PF S1/2 Tibial Grc/Sol 4 0    Reflexes:  Right Left Comments  Pectoralis      Biceps (C5/6) 2 2   Brachioradialis (C5/6) 2 2    Triceps (C6/7) 2 2    Patellar (L3/4) 2 2    Achilles (S1)      Hoffman      Plantar     Jaw jerk    Sensation:  Light touch intact   Pin prick    Temperature    Vibration   Proprioception    Coordination/Complex Motor:  - Finger to Nose intact - Heel to shin unable to do - Rapid alternating movement are slowed bilaterally. - Gait: Deferred.  Labs   CBC:  Recent Labs  Lab 08/31/20 2100 08/31/20 2148 09/01/20 0500  WBC 9.7  --  8.8  NEUTROABS 5.6  --   --   HGB 9.3* 9.2* 8.9*  HCT 30.1* 27.0* 28.5*  MCV 78.8*  --  78.5*  PLT 246  --  161    Basic Metabolic Panel:  Lab Results  Component Value Date   NA 138 09/01/2020   K 4.0 09/01/2020   CO2 22 09/01/2020   GLUCOSE 107 (H) 09/01/2020   BUN 13 09/01/2020   CREATININE 0.59 09/01/2020   CALCIUM 9.1 09/01/2020   GFRNONAA >60 09/01/2020   GFRAA >60 02/27/2018   Lipid Panel:  Lab Results  Component Value Date   LDLCALC 131 (H) 09/01/2020   HgbA1c:  Lab Results  Component Value Date   HGBA1C 5.3 09/01/2020   Urine Drug Screen:     Component Value Date/Time    LABOPIA NONE DETECTED 08/31/2020 2133   COCAINSCRNUR NONE DETECTED 08/31/2020 2133   LABBENZ NONE DETECTED 08/31/2020 2133   AMPHETMU NONE DETECTED 08/31/2020 2133   THCU NONE DETECTED 08/31/2020 2133   LABBARB NONE DETECTED 08/31/2020 2133    Alcohol Level No results found for: [place]  CT Head without contrast(personally reviewed): CTH was negative for a large hypodensity concerning for a large territory infarct or hyperdensity concerning for an ICH  CT angio Head and Neck with contrast: 1. No emergent large vessel occlusion or high-grade stenosis. 2. 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 3. Coil mass at the anterior communicating artery without visible residual aneurysm filling. 4. Aberrant right subclavian artery.  MRI Brain(personally reviewed): 1. Confluent Acute Right ACA territory infarct. This includes involvement of the anterior basal ganglia (medial striate artery territory). Ischemia also in the Left ACA territory but limited to cortex of  the cingulate gyrus. 2. Cytotoxic edema but no associated hemorrhage or significant mass effect.  Impression   LYNNETT LANGLINAIS is a 60 y.o. female with PMH significant for HLD who had recent embolization and coiling of the R ACA aneurysm on 08/25/20 who presents with R ACA territory and a small L ACA territory infarct. Her neurologic examination is notable for L lower extremity flaccid with mild weakness of RLE.  Could potentially be an embolic/thrombic infarct of the R ACA secondary to recent aneurysm repair.  Primary Diagnosis:  Cerebral infarction due to embolism of  right anterior cerebral artery.    Recommendations  Plan:  - Frequent Neuro checks per stroke unit protocol - Recommend brain imaging with MRI Brain without contrast - Recommend obtaining TTE - Recommend obtaining Lipid panel with LDL - Please start statin if LDL > 70 - Recommend HbA1c - Antithrombotic - aspirin 81mg  daily and brilinta 90mg  BID -  Recommend DVT ppx - SBP goal - permissive hypertension first 24 h < 220/110. Held home meds.  - Recommend Telemetry monitoring for arrythmia - Recommend bedside swallow screen prior to PO intake. - Stroke education booklet - Recommend PT/OT/SLP consult - Stroke team to follow.   ______________________________________________________________________   Thank you for the opportunity to take part in the care of this patient. If you have any further questions, please contact the neurology consultation attending.  Signed,  [place] Pager Number [phone] _ _ _   _ __   _ __ _ _  __ __   _ __   __ _

## 2020-09-02 ENCOUNTER — Inpatient Hospital Stay (HOSPITAL_COMMUNITY): Payer: Self-pay

## 2020-09-02 DIAGNOSIS — G9341 Metabolic encephalopathy: Secondary | ICD-10-CM

## 2020-09-02 DIAGNOSIS — I6389 Other cerebral infarction: Secondary | ICD-10-CM

## 2020-09-02 DIAGNOSIS — F17200 Nicotine dependence, unspecified, uncomplicated: Secondary | ICD-10-CM

## 2020-09-02 LAB — ECHOCARDIOGRAM COMPLETE
Area-P 1/2: 3.85 cm2
Height: 66 in
S' Lateral: 2 cm
Weight: 3167.57 oz

## 2020-09-02 LAB — CBC WITH DIFFERENTIAL/PLATELET
Abs Immature Granulocytes: 0.1 10*3/uL — ABNORMAL HIGH (ref 0.00–0.07)
Basophils Absolute: 0 10*3/uL (ref 0.0–0.1)
Basophils Relative: 0 %
Eosinophils Absolute: 0 10*3/uL (ref 0.0–0.5)
Eosinophils Relative: 0 %
HCT: 28.3 % — ABNORMAL LOW (ref 36.0–46.0)
Hemoglobin: 8.7 g/dL — ABNORMAL LOW (ref 12.0–15.0)
Immature Granulocytes: 1 %
Lymphocytes Relative: 16 %
Lymphs Abs: 2 10*3/uL (ref 0.7–4.0)
MCH: 24 pg — ABNORMAL LOW (ref 26.0–34.0)
MCHC: 30.7 g/dL (ref 30.0–36.0)
MCV: 78.2 fL — ABNORMAL LOW (ref 80.0–100.0)
Monocytes Absolute: 0.8 10*3/uL (ref 0.1–1.0)
Monocytes Relative: 6 %
Neutro Abs: 9.3 10*3/uL — ABNORMAL HIGH (ref 1.7–7.7)
Neutrophils Relative %: 77 %
Platelets: 262 10*3/uL (ref 150–400)
RBC: 3.62 MIL/uL — ABNORMAL LOW (ref 3.87–5.11)
RDW: 16.3 % — ABNORMAL HIGH (ref 11.5–15.5)
WBC: 12.2 10*3/uL — ABNORMAL HIGH (ref 4.0–10.5)
nRBC: 0.2 % (ref 0.0–0.2)

## 2020-09-02 LAB — BASIC METABOLIC PANEL
Anion gap: 8 (ref 5–15)
BUN: 13 mg/dL (ref 6–20)
CO2: 25 mmol/L (ref 22–32)
Calcium: 9.4 mg/dL (ref 8.9–10.3)
Chloride: 102 mmol/L (ref 98–111)
Creatinine, Ser: 0.83 mg/dL (ref 0.44–1.00)
GFR, Estimated: >60 mL/min (ref >60)
Glucose, Bld: 104 mg/dL — ABNORMAL HIGH (ref 70–99)
Potassium: 3.8 mmol/L (ref 3.5–5.1)
Sodium: 135 mmol/L (ref 135–145)

## 2020-09-02 LAB — MAGNESIUM: Magnesium: 2.1 mg/dL (ref 1.7–2.4)

## 2020-09-02 NOTE — Progress Notes (Signed)
PROGRESS NOTE  Betty Archer ZTI:[id number] DOB: 1960/10/08   PCP: Patient, No Pcp Per (Inactive)  Patient is from: Home.  DOA: 08/31/2020 LOS: 1  Chief complaints:  Chief Complaint  Patient presents with   Altered Mental Status   Numbness     Brief Narrative / Interim history: 60 year old F with PMH of HLD, anemia, right ACA aneurysm status post coil embolization/stent on 7/7, untreated left MCA bifurcation aneurysm and recent COVID-19 infection presenting to [facility] long ED with altered mental status and leg weakness and found to have acute right ACA territory infarct involving anterior BG, left ACA territory ischemia limited to cortex and the cingulate gyrus, and cytotoxic edema without hemorrhage or significant mass-effect.  Neurology and neuro interventionist consulted.  Patient was transferred to [facility] for further evaluation and treatment of acute CVA.  CTA head and neck with no emergent LVO or high-grade stenosis, unchanged 8 mm left MCA bifurcation aneurysm and coil mass at the anterior communicating artery without visible residual aneurysm and aberrant right subclavian artery.   TTE with LVEF of 65 to 70%, G2-DD.  A1c 5.3%.  LDL 131.  Patient is on high intensity statin, Brilinta and aspirin.  Therapy recommended CIR.  Subjective: Seen and examined earlier this morning.  No major events overnight of this morning.  She is a sleepy but wakes to voice easily. She is fully oriented except to date when she wakes but she falls back asleep quickly. She reports numbness and tingling in her legs but could not specify which legs.   Objective: Vitals:   09/02/20 0416 09/02/20 0929 09/02/20 1217 09/02/20 1324  BP: 130/66 114/67 (!) 110/97   Pulse: 92 75 (!) 101   Resp: 16 20 16 20   Temp: 99.4 F (37.4 C) 98 F (36.7 C) 98.3 F (36.8 C)   TempSrc: Oral Oral Oral   SpO2: 100% 100% 100%   Weight:      Height:        Intake/Output Summary (Last 24 hours) at 09/02/2020 1438 Last  data filed at 09/02/2020 0944 Gross per 24 hour  Intake 240 ml  Output 1325 ml  Net -1085 ml   Filed Weights   08/31/20 2056  Weight: 89.8 kg    Examination:  GENERAL: No apparent distress.  Nontoxic. HEENT: MMM.  Vision and hearing grossly intact.  NECK: Supple.  No apparent JVD.  RESP: On RA.  No IWOB.  Fair aeration bilaterally. CVS:  RRR. Heart sounds normal.  ABD/GI/GU: BS+. Abd soft, NTND.  MSK/EXT:  Moves extremities. No apparent deformity. No edema.  SKIN: no apparent skin lesion or wound NEURO: Sleepy but wakes to voice.  Oriented x4 except date.  Follows commands.  No facial asymmetry.  PERRL.  EOM grossly intact.  Motor 2/5 in LLE, 3+/5 in LUE, 4/5 elsewhere.  Sensory grossly intact.  Diminished patellar reflex in LLE compared to RLE. PSYCH: Sleepy but wakes to voice although she falls back asleep.  No distress or agitation.  Procedures:  None  Microbiology summarized: 7/5-COVID-19 PCR positive 7/13-COVID-19 PCR negative  Assessment & Plan: Acute right ACA territory infarct-presented with AMS and leg weakness.  MRI brain and CTA head and neck as above.  LDL 131.  A1c 5.3%.  TTE with LVEF of 65 to 70% and G2 DD.  Still with left-sided weakness, more pronounced at in LLE -Neurology following -Continue high intensity statin -Continue aspirin and Brilinta -Follow P2 Y12 -Therapy recommended CIR.  History of right ACA aneurysm  status post coil embolization on 7/7 Left MCA aneurysm-stable -Continue Brilinta and aspirin per IR  Acute metabolic encephalopathy-likely from CVA and possibly delirium from lack of sleep.  She is a sleepy but wakes to voice easily.  She is oriented x4 except date but falls back to sleep. -Reorientation and delirium precautions -Minimize or avoid sedating medications  Microcytic anemia: H&H relatively stable. Recent Labs    08/25/20 1139 08/25/20 1244 08/25/20 1323 08/25/20 1520 08/26/20 0606 08/27/20 1031 08/31/20 2100  08/31/20 2148 09/01/20 0500 09/02/20 0244  HGB 11.6* 9.5* 9.2* 8.7* 8.4* 8.2* 9.3* 9.2* 8.9* 8.7*  -Check anemia panel in the morning -Continue monitoring  Essential hypertension: Hyperlipidemia -Plan as above  Right ACA aneurysm status post recent repair Left MCA aneurysm -Outpatient follow-up    COVID-19 infection: Incidentally tested positive on 7/5.  No respiratory symptoms.  Repeat COVID-19 PCR negative on 7/13. -Isolation precautions discontinued per infection prevention recommendation  Leukocytosis: Likely demargination versus infection. -Continue monitoring  Body mass index is 31.95 kg/m.         DVT prophylaxis:  SCDs Start: 08/31/20 2355  Code Status: Full code Family Communication: Patient and/or RN.  Updated patient's son at bedside. Level of care: Telemetry Medical Status is: Inpatient  Remains inpatient appropriate because:Altered mental status and Unsafe d/c plan  Dispo: The patient is from: Home              Anticipated d/c is to: CIR              Patient currently is not medically stable to d/c.   Difficult to place patient No       Consultants:  Neurology Interventional radiology   Sch Meds:  Scheduled Meds:  aspirin  81 mg Oral Daily   atorvastatin  80 mg Oral QHS   lidocaine  1 patch Transdermal Q24H   ticagrelor  90 mg Oral BID   Continuous Infusions: PRN Meds:.acetaminophen, ondansetron (ZOFRAN) IV  Antimicrobials: Anti-infectives (From admission, onward)    None        I have personally reviewed the following labs and images: CBC: Recent Labs  Lab 08/27/20 1031 08/31/20 2100 08/31/20 2148 09/01/20 0500 09/02/20 0244  WBC 7.3 9.7  --  8.8 12.2*  NEUTROABS  --  5.6  --   --  9.3*  HGB 8.2* 9.3* 9.2* 8.9* 8.7*  HCT 26.1* 30.1* 27.0* 28.5* 28.3*  MCV 77.2* 78.8*  --  78.5* 78.2*  PLT 167 246  --  220 262   BMP &GFR Recent Labs  Lab 08/31/20 2100 08/31/20 2148 09/01/20 0500 09/02/20 0244  NA 140 140 138  135  K 3.7 4.3 4.0 3.8  CL 107 108 108 102  CO2 25  --  22 25  GLUCOSE 90 90 107* 104*  BUN 16 17 13 13   CREATININE 0.86 0.80 0.59 0.83  CALCIUM 9.4  --  9.1 9.4  MG  --   --  2.0 2.1  PHOS  --   --  3.8  --    Estimated Creatinine Clearance: 81.4 mL/min (by C-G formula based on SCr of 0.83 mg/dL). Liver & Pancreas: Recent Labs  Lab 08/31/20 2100 09/01/20 0500  AST 15 14*  ALT 14 13  ALKPHOS 77 66  BILITOT 0.4 0.5  PROT 7.7 7.1  ALBUMIN 3.9 3.8   No results for input(s): LIPASE, AMYLASE in the last 168 hours. No results for input(s): AMMONIA in the last 168 hours. Diabetic: Recent Labs  09/01/20 0500  HGBA1C 5.3   Recent Labs  Lab 08/31/20 2105  GLUCAP 90   Cardiac Enzymes: No results for input(s): CKTOTAL, CKMB, CKMBINDEX, TROPONINI in the last 168 hours. No results for input(s): PROBNP in the last 8760 hours. Coagulation Profile: Recent Labs  Lab 08/31/20 2100 09/01/20 0500  INR 1.0 1.0   Thyroid Function Tests: No results for input(s): TSH, T4TOTAL, FREET4, T3FREE, THYROIDAB in the last 72 hours. Lipid Profile: Recent Labs    09/01/20 0500  CHOL 198  HDL 51  LDLCALC 131*  TRIG 82  CHOLHDL 3.9   Anemia Panel: Recent Labs    09/01/20 0807  FERRITIN 91  TIBC 221*  IRON 35   Urine analysis:    Component Value Date/Time   COLORURINE YELLOW 08/31/2020 2133   APPEARANCEUR CLEAR 08/31/2020 2133   LABSPEC 1.015 08/31/2020 2133   PHURINE 6.0 08/31/2020 2133   GLUCOSEU NEGATIVE 08/31/2020 2133   HGBUR NEGATIVE 08/31/2020 2133   BILIRUBINUR NEGATIVE 08/31/2020 2133   KETONESUR NEGATIVE 08/31/2020 2133   PROTEINUR NEGATIVE 08/31/2020 2133   NITRITE NEGATIVE 08/31/2020 2133   LEUKOCYTESUR NEGATIVE 08/31/2020 2133   Sepsis Labs: Invalid input(s): PROCALCITONIN, [place]  Microbiology: Recent Results (from the past 240 hour(s))  SARS CORONAVIRUS 2 (TAT 6-24 HRS) Nasopharyngeal Nasopharyngeal Swab     Status: Abnormal   Collection Time:  08/24/20  9:03 AM   Specimen: Nasopharyngeal Swab  Result Value Ref Range Status   SARS Coronavirus 2 POSITIVE (A) NEGATIVE Final    Comment: (NOTE) SARS-CoV-2 target nucleic acids are DETECTED.  The SARS-CoV-2 RNA is generally detectable in upper and lower respiratory specimens during the acute phase of infection. Positive results are indicative of the presence of SARS-CoV-2 RNA. Clinical correlation with patient history and other diagnostic information is  necessary to determine patient infection status. Positive results do not rule out bacterial infection or co-infection with other viruses.  The expected result is Negative.  Fact Sheet for Patients: SugarRoll.be  Fact Sheet for Healthcare Providers: https://www.woods-mathews.com/  This test is not yet approved or cleared by the [place] FDA and  has been authorized for detection and/or diagnosis of SARS-CoV-2 by FDA under an Emergency Use Authorization (EUA). This EUA will remain  in effect (meaning this test can be used) for the duration of the COVID-19 declaration under Section 564(b)(1) of the Act, 21 U. S.C. section 360bbb-3(b)(1), unless the authorization is terminated or revoked sooner.   Performed at [facility], [place] [address], [place], [place] [address]   SARS Coronavirus 2 by RT PCR (hospital order, performed in [facility] hospital lab) Nasopharyngeal Nasopharyngeal Swab     Status: None   Collection Time: 08/25/20  7:47 AM   Specimen: Nasopharyngeal Swab  Result Value Ref Range Status   SARS Coronavirus 2 NEGATIVE NEGATIVE Final    Comment: (NOTE) SARS-CoV-2 target nucleic acids are NOT DETECTED.  The SARS-CoV-2 RNA is generally detectable in upper and lower respiratory specimens during the acute phase of infection. The lowest concentration of SARS-CoV-2 viral copies this assay can detect is 250 copies / mL. A negative result does not preclude SARS-CoV-2  infection and should not be used as the sole basis for treatment or other patient management decisions.  A negative result may occur with improper specimen collection / handling, submission of specimen other than nasopharyngeal swab, presence of viral mutation(s) within the areas targeted by this assay, and inadequate number of viral copies (<250 copies / mL). A negative  result must be combined with clinical observations, patient history, and epidemiological information.  Fact Sheet for Patients:   StrictlyIdeas.no  Fact Sheet for Healthcare Providers: BankingDealers.co.za  This test is not yet approved or  cleared by the [place] FDA and has been authorized for detection and/or diagnosis of SARS-CoV-2 by FDA under an Emergency Use Authorization (EUA).  This EUA will remain in effect (meaning this test can be used) for the duration of the COVID-19 declaration under Section 564(b)(1) of the Act, 21 U.S.C. section 360bbb-3(b)(1), unless the authorization is terminated or revoked sooner.  Performed at [facility], [place] [address], [place], [place] [address]   MRSA Next Gen by PCR, Nasal     Status: None   Collection Time: 08/25/20  3:28 PM   Specimen: Nasal Mucosa; Nasal Swab  Result Value Ref Range Status   MRSA by PCR Next Gen NOT DETECTED NOT DETECTED Final    Comment: (NOTE) The GeneXpert MRSA Assay (FDA approved for NASAL specimens only), is one component of a comprehensive MRSA colonization surveillance program. It is not intended to diagnose MRSA infection nor to guide or monitor treatment for MRSA infections. Test performance is not FDA approved in patients less than 42 years old. Performed at [facility], [place] [address] [place], [place] [address]   SARS CORONAVIRUS 2 (TAT 6-24 HRS) Nasopharyngeal Nasopharyngeal Swab     Status: None   Collection Time: 09/01/20 12:57 PM   Specimen: Nasopharyngeal Swab   Result Value Ref Range Status   SARS Coronavirus 2 NEGATIVE NEGATIVE Final    Comment: (NOTE) SARS-CoV-2 target nucleic acids are NOT DETECTED.  The SARS-CoV-2 RNA is generally detectable in upper and lower respiratory specimens during the acute phase of infection. Negative results do not preclude SARS-CoV-2 infection, do not rule out co-infections with other pathogens, and should not be used as the sole basis for treatment or other patient management decisions. Negative results must be combined with clinical observations, patient history, and epidemiological information. The expected result is Negative.  Fact Sheet for Patients: SugarRoll.be  Fact Sheet for Healthcare Providers: https://www.woods-mathews.com/  This test is not yet approved or cleared by the [place] FDA and  has been authorized for detection and/or diagnosis of SARS-CoV-2 by FDA under an Emergency Use Authorization (EUA). This EUA will remain  in effect (meaning this test can be used) for the duration of the COVID-19 declaration under Se ction 564(b)(1) of the Act, 21 U.S.C. section 360bbb-3(b)(1), unless the authorization is terminated or revoked sooner.  Performed at [facility], [place] [address] [place], [place] [address]     Radiology Studies: ECHOCARDIOGRAM COMPLETE  Result Date: 09/02/2020    ECHOCARDIOGRAM REPORT   Patient Name:   MKAYLA STEELE Date of Exam: 09/02/2020 Medical Rec #:  [id number]      Height:       66.0 in Accession #:    [id number]     Weight:       198.0 lb Date of Birth:  [birth year]      BSA:          1.991 m Patient Age:    2 years       BP:           130/66 mmHg Patient Gender: F              HR:           64 bpm. Exam Location:  Inpatient Procedure:  2D Echo, Color Doppler and Cardiac Doppler Indications:    Stroke I63.9  History:        Patient has prior history of Echocardiogram examinations, most                 recent 08/13/2013.  Risk Factors:Dyslipidemia and Diabetes.  Sonographer:    Bernadene Person RDCS Referring Phys: [id number] OLADAPO ADEFESO IMPRESSIONS  1. Left ventricular ejection fraction, by estimation, is 65 to 70%. The left ventricle has normal function. The left ventricle has no regional wall motion abnormalities. Left ventricular diastolic parameters are consistent with Grade II diastolic dysfunction (pseudonormalization).  2. Right ventricular systolic function is normal. The right ventricular size is normal. Tricuspid regurgitation signal is inadequate for assessing PA pressure.  3. The mitral valve is normal in structure. No evidence of mitral valve regurgitation. No evidence of mitral stenosis.  4. The aortic valve is tricuspid. Aortic valve regurgitation is not visualized. No aortic stenosis is present.  5. The inferior vena cava is normal in size with greater than 50% respiratory variability, suggesting right atrial pressure of 3 mmHg. FINDINGS  Left Ventricle: Left ventricular ejection fraction, by estimation, is 65 to 70%. The left ventricle has normal function. The left ventricle has no regional wall motion abnormalities. The left ventricular internal cavity size was normal in size. There is  no left ventricular hypertrophy. Left ventricular diastolic parameters are consistent with Grade II diastolic dysfunction (pseudonormalization). Right Ventricle: The right ventricular size is normal. No increase in right ventricular wall thickness. Right ventricular systolic function is normal. Tricuspid regurgitation signal is inadequate for assessing PA pressure. Left Atrium: Left atrial size was normal in size. Right Atrium: Right atrial size was normal in size. Pericardium: There is no evidence of pericardial effusion. Mitral Valve: The mitral valve is normal in structure. No evidence of mitral valve regurgitation. No evidence of mitral valve stenosis. Tricuspid Valve: The tricuspid valve is normal in structure. Tricuspid valve  regurgitation is not demonstrated. Aortic Valve: The aortic valve is tricuspid. Aortic valve regurgitation is not visualized. No aortic stenosis is present. Pulmonic Valve: The pulmonic valve was normal in structure. Pulmonic valve regurgitation is not visualized. Aorta: The aortic root is normal in size and structure. Venous: The inferior vena cava is normal in size with greater than 50% respiratory variability, suggesting right atrial pressure of 3 mmHg. IAS/Shunts: No atrial level shunt detected by color flow Doppler.  LEFT VENTRICLE PLAX 2D LVIDd:         3.60 cm  Diastology LVIDs:         2.00 cm  LV e' medial:    6.24 cm/s LV PW:         1.00 cm  LV E/e' medial:  14.6 LV IVS:        1.00 cm  LV e' lateral:   7.50 cm/s LVOT diam:     2.00 cm  LV E/e' lateral: 12.1 LV SV:         84 LV SV Index:   42 LVOT Area:     3.14 cm  RIGHT VENTRICLE RV S prime:     11.60 cm/s TAPSE (M-mode): 2.1 cm LEFT ATRIUM             Index       RIGHT ATRIUM           Index LA diam:        2.20 cm 1.10 cm/m  RA Area:     16.40  cm LA Vol (A2C):   30.4 ml 15.27 ml/m RA Volume:   42.60 ml  21.39 ml/m LA Vol (A4C):   27.5 ml 13.81 ml/m LA Biplane Vol: 31.0 ml 15.57 ml/m  AORTIC VALVE LVOT Vmax:   137.00 cm/s LVOT Vmean:  90.600 cm/s LVOT VTI:    0.267 m  AORTA Ao Root diam: 3.10 cm Ao Asc diam:  3.20 cm MITRAL VALVE MV Area (PHT): 3.85 cm    SHUNTS MV Decel Time: 197 msec    Systemic VTI:  0.27 m MV E velocity: 90.80 cm/s  Systemic Diam: 2.00 cm MV A velocity: 51.00 cm/s MV E/A ratio:  1.78 Loralie Champagne MD Electronically signed by Loralie Champagne MD Signature Date/Time: 09/02/2020/2:38:04 PM    Final        [address] [place] [address]  If 7PM-7AM, please contact night-coverage [email or web address] 09/02/2020, 2:38 PM

## 2020-09-02 NOTE — Progress Notes (Signed)
  Echocardiogram 2D Echocardiogram has been performed.  [name] 09/02/2020, 8:41 AM

## 2020-09-02 NOTE — Progress Notes (Signed)
Physical Therapy Treatment Patient Details Name: Betty Archer MRN: [id number] DOB: Jul 09, 1960 Today's Date: 09/02/2020    History of Present Illness 60 y.o. female with medical history significant for hypertension, hyperlipidemia who recently had a repair of right ACA aneurysm, as well as left MCA aneurysm (unrepaired) which resulted in right groin hematoma that required intubation after the procedure with subsequent discharge on 08/27/2020 presented 08/31/20  with altered mental status and leg weakness.  CT -unchanged 8 mm left MCA bifurcation aneurysm.  Teleneurology recommended stroke work-up. MRI-Confluent Acute Right ACA territory infarct. This includes  involvement of the anterior basal ganglia (medial striate artery  territory).  Ischemia also in the Left ACA territory but limited to cortex of the  cingulate gyrus.  2. Cytotoxic edema but no associated hemorrhage or significant mass  effect.    PT Comments    Despite pt having a hard time staying awake pt with significant progress today. When pt was awake pt oriented x4, able to follow commands with mild delay, and was able to participate in therapy. Pt continues with posterior bias while sitting EOB requiring tactile and verbal cues to maintain midline. Pt was able to complete stand pvt to chair with assistx2 today. Pt is an excellent candidate for CIR as pt is demonstrating great rehab potential. Pt was indep PTA and would benefit from aggressive rehab program. Acute PT to cont to follow.   Follow Up Recommendations  CIR     Equipment Recommendations  Rolling walker with 5" wheels;3in1 (PT) (RW, maybe w/c, BSC)    Recommendations for Other Services Rehab consult     Precautions / Restrictions Precautions Precautions: Fall Precaution Comments: falls asleep easily Restrictions Weight Bearing Restrictions: No    Mobility  Bed Mobility Overal bed mobility: Needs Assistance Bed Mobility: Supine to Sit     Supine to sit: Mod  assist;+2 for physical assistance;HOB elevated     General bed mobility comments: pt able to bring R LE to EOB, maxA for L LE management, with max verbal cues pt attempted to use L UE to assist but ultimately required modAx2 for trunk elevation and pivoting hips to EOB    Transfers Overall transfer level: Needs assistance Equipment used: 2 person hand held assist (face to face transfer) Transfers: Sit to/from [facility] Sit to Stand: Mod assist;+2 physical assistance Stand pivot transfers: Mod assist;+2 physical assistance       General transfer comment: pt with good power up, modA to maintain balance as pt with posterior bias, max directional verbal cues to sequence stepping to chair, L knee blocked due to instability when advancing R LE but able to stand without buckling  Ambulation/Gait                 Stairs             Wheelchair Mobility    Modified Rankin (Stroke Patients Only)       Balance Overall balance assessment: Needs assistance Sitting-balance support: Feet supported;Bilateral upper extremity supported Sitting balance-Leahy Scale: Poor Sitting balance - Comments: pt with posterior lean requiring max directional verbal cues to self correct, utimately requiring modA to return to midline   Standing balance support: During functional activity;Bilateral upper extremity supported Standing balance-Leahy Scale: Poor Standing balance comment: dependent on external support                            Cognition Arousal/Alertness: Awake/alert (but falls asleep  quickly and freq, when awoke pt oriented and able to answer questions appropriately) Behavior During Therapy: Flat affect (easily falls asleep) Overall Cognitive Status: Impaired/Different from baseline Area of Impairment: Attention;Problem solving;Awareness;Safety/judgement;Following commands                   Current Attention Level: Focused   Following  Commands: Follows one step commands with increased time Safety/Judgement: Decreased awareness of deficits Awareness: Intellectual Problem Solving: Decreased initiation;Requires verbal cues;Requires tactile cues General Comments: pt would fall asleep freq t/o session however was appropriate when awake, pt aware she was falling backwards but didn't attempt to self correct requiring max verbal cues      Exercises General Exercises - Lower Extremity Long Arc Quad: AAROM;Left;10 reps;Seated (attempted to have pt hold at top however pt unable to maintain focus or complete) Hip Flexion/Marching: AAROM;Left;10 reps;Seated    General Comments General comments (skin integrity, edema, etc.): VSS, very sleepy      Pertinent Vitals/Pain Pain Assessment: No/denies pain    Home Living                      Prior Function            PT Goals (current goals can now be found in the care plan section) Progress towards PT goals: Progressing toward goals    Frequency    Min 4X/week      PT Plan Frequency needs to be updated    Co-evaluation              AM-PAC PT "6 Clicks" Mobility   Outcome Measure  Help needed turning from your back to your side while in a flat bed without using bedrails?: A Lot Help needed moving from lying on your back to sitting on the side of a flat bed without using bedrails?: A Lot Help needed moving to and from a bed to a chair (including a wheelchair)?: A Lot Help needed standing up from a chair using your arms (e.g., wheelchair or bedside chair)?: A Lot Help needed to walk in hospital room?: Total Help needed climbing 3-5 steps with a railing? : Total 6 Click Score: 10    End of Session Equipment Utilized During Treatment: Gait belt Activity Tolerance: Patient limited by lethargy Patient left: in chair;with call bell/phone within reach;with chair alarm set;with family/visitor present Nurse Communication: Mobility status PT Visit Diagnosis:  Unsteadiness on feet (R26.81);Difficulty in walking, not elsewhere classified (R26.2);Other symptoms and signs involving the nervous system (R29.898);Hemiplegia and hemiparesis Hemiplegia - Right/Left: Left Hemiplegia - dominant/non-dominant: Non-dominant Hemiplegia - caused by: Cerebral infarction     Time: 0930-1005 PT Time Calculation (min) (ACUTE ONLY): 35 min  Charges:  $Therapeutic Exercise: 8-22 mins $Therapeutic Activity: 8-22 mins                     Kittie Plater, PT, DPT Acute Rehabilitation Services Pager #: [phone] Office #: [phone]    Berline Lopes 09/02/2020, 1:50 PM

## 2020-09-02 NOTE — Progress Notes (Addendum)
Inpatient Rehabilitation Admissions Coordinator   Inpatient rehab consult./prescreen received. Noted COVID + 9 days ago. Patients are eligible to be considered for admit to the [facility] when cleared from airborne precautions by acute MD or Infectious disease regardless of onset day.  Please call me with any questions. I will follow up tomorrow. Total assist with therapy on evaluation.   Danne Baxter, RN, MSN Rehab Admissions Coordinator [phone] 09/02/2020 11:14 AM

## 2020-09-02 NOTE — Progress Notes (Addendum)
Referring Physician(s): Donnetta Simpers  Supervising Physician: Pedro Earls  Patient Status:  [facility] - In-pt  Chief Complaint:  Weakness on left leg patient is s/p successful endovascular embolization of a 7 mm irregularly-shaped wide neck anterior communicating artery aneurysm with stent assisted coiling using "Y" stenting technique with Dr. Karenann Cai on 02/23/41, case was complicated by periprocedural bleeding from the puncture site, bleeding was treated with extended compression.   Patient was discharged home in stable condition, neurologically intact, with ASA 81 mg daily and Brilinta 90 mg twice daily.   Subjective:  Patient sitting in bed, finishing her breakfast.  Breakfast placed appears almost empty. Patient still appears to be lethargic but easily arousable with verbal stimuli. States that she has been taking aspirin and Brilinta as directed after the procedure.   Allergies: Patient has no known allergies.  Medications: Prior to Admission medications   Medication Sig Start Date End Date Taking? Authorizing Provider  aspirin 81 MG chewable tablet Chew 81 mg by mouth in the morning and at bedtime.   Yes [provider]  docusate sodium (COLACE) 100 MG capsule Take 1 capsule (100 mg total) by mouth 2 (two) times daily. Patient taking differently: Take 100 mg by mouth 2 (two) times daily as needed for mild constipation. 08/26/20  Yes Docia Barrier, PA  ferrous sulfate 325 (65 FE) MG tablet Take 1 tablet (325 mg total) by mouth daily with breakfast. Patient taking differently: Take 325 mg by mouth daily. 08/26/20  Yes Docia Barrier, PA  ticagrelor (BRILINTA) 90 MG TABS tablet Take 90 mg by mouth 2 (two) times daily.   Yes [provider]     Vital Signs: BP 114/67 (BP Location: Right Arm)   Pulse 75   Temp 98 F (36.7 C) (Oral)   Resp 20   Ht 5\' 6"  (1.676 m)   Wt 197 lb 15.6 oz (89.8 kg)   SpO2 100%    BMI 31.95 kg/m   Physical Exam Vitals reviewed.  Constitutional:      General: She is not in acute distress.    Appearance: She is not ill-appearing.     Comments: Appears lethargic, keeps falling asleep during assessment.  Appears less lethargic than yesterday.  HENT:     Head: Normocephalic and atraumatic.  Pulmonary:     Effort: Pulmonary effort is normal.  Skin:    General: Skin is warm and dry.     Coloration: Skin is not jaundiced or pale.  Neurological:     Mental Status: She is disoriented.     Motor: Weakness present.     Comments: Alert and oriented to self and place only. Do not fully understand the situation, states that she does not know why she came to hospital. No facial asymmetry. Tongue midline. Able to follow simple command. Strength in upper extremity asymmetric, right stronger than left. Positive pronator drift on left arm. Right lower leg strength intact, patient was able to lift the left leg off the bed today.     Imaging: MR BRAIN WO CONTRAST  Result Date: 09/01/2020 CLINICAL DATA:  60 year old female with lower extremity tingling. Encephalopathy. Status post coil embolization of anterior communicating artery aneurysm 1 week ago. 8 mm left MCA bifurcation aneurysm. EXAM: MRI HEAD WITHOUT CONTRAST TECHNIQUE: Multiplanar, multiecho pulse sequences of the brain and surrounding structures were obtained without intravenous contrast. COMPARISON:  CTA head and neck yesterday and earlier. Brain MRI 08/13/2013. FINDINGS: Brain: Confluent restricted  diffusion in the right ACA territory (series 5, image 78), but also affecting some cortex of the cingulate gyrus on the left (series 12, image 48). And there is also dense restricted diffusion in the anterior right basal ganglia related to penetrating arteries of the proximal ACA. Posteriorly on the right a small area of medial perirolandic cortex is also affected (series 5, image 89). Questionable also punctate abnormal  diffusion in the right superior cerebellum on series 5, image 68, but not correlated on the coronal images. T2 and FLAIR hyperintensity in keeping with cytotoxic edema. No parenchymal hemorrhage or significant mass effect. No midline shift or ventriculomegaly. Small widely scattered cerebral white matter T2 and FLAIR hyperintense foci are in a similar pattern to the 2015 MRI, mildly progressed since that time. No chronic cortical encephalomalacia or definite chronic cerebral blood products. No midline shift, evidence of mass lesion, extra-axial collection or acute intracranial hemorrhage. Basilar cisterns are patent. Cervicomedullary junction and pituitary are within normal limits. Vascular: Major intracranial vascular flow voids are stable since 2015. T2* susceptibility artifact related to the anterior communicating artery aneurysm stent assisted coiling. Skull and upper cervical spine: Negative. Visualized bone marrow signal is within normal limits. Sinuses/Orbits: Negative. Paranasal Visualized paranasal sinuses and mastoids are stable and well aerated. Other: Negative visible scalp and face soft tissues. IMPRESSION: 1. Confluent Acute Right ACA territory infarct. This includes involvement of the anterior basal ganglia (medial striate artery territory). Ischemia also in the Left ACA territory but limited to cortex of the cingulate gyrus. 2. Cytotoxic edema but no associated hemorrhage or significant mass effect. Electronically Signed   By: Genevie Ann M.D.   On: 09/01/2020 07:53   CT HEAD CODE STROKE WO CONTRAST  Result Date: 08/31/2020 CLINICAL DATA:  Code stroke. Lower extremity tingling and encephalopathy EXAM: CT HEAD WITHOUT CONTRAST TECHNIQUE: Contiguous axial images were obtained from the base of the skull through the vertex without intravenous contrast. COMPARISON:  Head CT 08/08/2020 FINDINGS: Brain: There is no mass, hemorrhage or extra-axial collection. The size and configuration of the ventricles and  extra-axial CSF spaces are normal. The brain parenchyma is normal, without evidence of acute or chronic infarction. Vascular: Aneurysm clips at the A-comm. Skull: The visualized skull base, calvarium and extracranial soft tissues are normal. Sinuses/Orbits: No fluid levels or advanced mucosal thickening of the visualized paranasal sinuses. No mastoid or middle ear effusion. The orbits are normal. ASPECTS [facility] Stroke Program Early CT Score) - Ganglionic level infarction (caudate, lentiform nuclei, internal capsule, insula, M1-M3 cortex): 7 - Supraganglionic infarction (M4-M6 cortex): 3 Total score (0-10 with 10 being normal): 10 IMPRESSION: 1. No acute intracranial abnormality. 2. ASPECTS is 10. These results were called by telephone at the time of interpretation on 08/31/2020 at 10:07 pm to provider [facility] , who verbally acknowledged these results. Electronically Signed   By: Ulyses Jarred M.D.   On: 08/31/2020 22:07   CT ANGIO HEAD CODE STROKE  Result Date: 08/31/2020 CLINICAL DATA:  Lower extremity tingling and encephalopathy. EXAM: CT ANGIOGRAPHY HEAD AND NECK TECHNIQUE: Multidetector CT imaging of the head and neck was performed using the standard protocol during bolus administration of intravenous contrast. Multiplanar CT image reconstructions and MIPs were obtained to evaluate the vascular anatomy. Carotid stenosis measurements (when applicable) are obtained utilizing NASCET criteria, using the distal internal carotid diameter as the denominator. CONTRAST:  168mL OMNIPAQUE IOHEXOL 350 MG/ML SOLN COMPARISON:  08/08/2020 FINDINGS: CTA NECK FINDINGS SKELETON: There is no bony spinal canal stenosis. No  lytic or blastic lesion. OTHER NECK: Normal pharynx, larynx and major salivary glands. No cervical lymphadenopathy. Unremarkable thyroid gland. UPPER CHEST: No pneumothorax or pleural effusion. No nodules or masses. AORTIC ARCH: There is calcific atherosclerosis of the aortic arch. There is no aneurysm,  dissection or hemodynamically significant stenosis of the visualized portion of the aorta. Aberrant right subclavian artery. the visualized proximal subclavian arteries are widely patent. RIGHT CAROTID SYSTEM: Normal without aneurysm, dissection or stenosis. LEFT CAROTID SYSTEM: Normal without aneurysm, dissection or stenosis. VERTEBRAL ARTERIES: Left dominant configuration. Both origins are clearly patent. There is no dissection, occlusion or flow-limiting stenosis to the skull base (V1-V3 segments). CTA HEAD FINDINGS POSTERIOR CIRCULATION: --Vertebral arteries: Normal V4 segments. --Inferior cerebellar arteries: Normal. --Basilar artery: Normal. --Superior cerebellar arteries: Normal. --Posterior cerebral arteries (PCA): Normal. ANTERIOR CIRCULATION: --Intracranial internal carotid arteries: Normal. --Anterior cerebral arteries (ACA): Coil mass at the anterior communicating artery. No visible residual aneurysm filling. --Middle cerebral arteries (MCA): Unchanged 8 mm aneurysm of the left MCA bifurcation. Normal right MCA. No occlusion. VENOUS SINUSES: As permitted by contrast timing, patent. ANATOMIC VARIANTS: None Review of the MIP images confirms the above findings. IMPRESSION: 1. No emergent large vessel occlusion or high-grade stenosis. 2. 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 3. Coil mass at the anterior communicating artery without visible residual aneurysm filling. 4. Aberrant right subclavian artery. Aortic Atherosclerosis (ICD10-I70.0). Electronically Signed   By: Ulyses Jarred M.D.   On: 08/31/2020 22:49   CT ANGIO NECK CODE STROKE  Result Date: 08/31/2020 CLINICAL DATA:  Lower extremity tingling and encephalopathy. EXAM: CT ANGIOGRAPHY HEAD AND NECK TECHNIQUE: Multidetector CT imaging of the head and neck was performed using the standard protocol during bolus administration of intravenous contrast. Multiplanar CT image reconstructions and MIPs were obtained to evaluate the vascular anatomy.  Carotid stenosis measurements (when applicable) are obtained utilizing NASCET criteria, using the distal internal carotid diameter as the denominator. CONTRAST:  198mL OMNIPAQUE IOHEXOL 350 MG/ML SOLN COMPARISON:  08/08/2020 FINDINGS: CTA NECK FINDINGS SKELETON: There is no bony spinal canal stenosis. No lytic or blastic lesion. OTHER NECK: Normal pharynx, larynx and major salivary glands. No cervical lymphadenopathy. Unremarkable thyroid gland. UPPER CHEST: No pneumothorax or pleural effusion. No nodules or masses. AORTIC ARCH: There is calcific atherosclerosis of the aortic arch. There is no aneurysm, dissection or hemodynamically significant stenosis of the visualized portion of the aorta. Aberrant right subclavian artery. the visualized proximal subclavian arteries are widely patent. RIGHT CAROTID SYSTEM: Normal without aneurysm, dissection or stenosis. LEFT CAROTID SYSTEM: Normal without aneurysm, dissection or stenosis. VERTEBRAL ARTERIES: Left dominant configuration. Both origins are clearly patent. There is no dissection, occlusion or flow-limiting stenosis to the skull base (V1-V3 segments). CTA HEAD FINDINGS POSTERIOR CIRCULATION: --Vertebral arteries: Normal V4 segments. --Inferior cerebellar arteries: Normal. --Basilar artery: Normal. --Superior cerebellar arteries: Normal. --Posterior cerebral arteries (PCA): Normal. ANTERIOR CIRCULATION: --Intracranial internal carotid arteries: Normal. --Anterior cerebral arteries (ACA): Coil mass at the anterior communicating artery. No visible residual aneurysm filling. --Middle cerebral arteries (MCA): Unchanged 8 mm aneurysm of the left MCA bifurcation. Normal right MCA. No occlusion. VENOUS SINUSES: As permitted by contrast timing, patent. ANATOMIC VARIANTS: None Review of the MIP images confirms the above findings. IMPRESSION: 1. No emergent large vessel occlusion or high-grade stenosis. 2. 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 3. Coil mass at the  anterior communicating artery without visible residual aneurysm filling. 4. Aberrant right subclavian artery. Aortic Atherosclerosis (ICD10-I70.0). Electronically Signed   By: Cletus Gash.D.  On: 08/31/2020 22:49    Labs:  CBC: Recent Labs    08/27/20 1031 08/31/20 2100 08/31/20 2148 09/01/20 0500 09/02/20 0244  WBC 7.3 9.7  --  8.8 12.2*  HGB 8.2* 9.3* 9.2* 8.9* 8.7*  HCT 26.1* 30.1* 27.0* 28.5* 28.3*  PLT 167 246  --  220 262     COAGS: Recent Labs    08/25/20 0630 08/31/20 2100 09/01/20 0500  INR 1.0 1.0 1.0  APTT 31 33 33     BMP: Recent Labs    08/26/20 0606 08/31/20 2100 08/31/20 2148 09/01/20 0500 09/02/20 0244  NA 140 140 140 138 135  K 3.9 3.7 4.3 4.0 3.8  CL 112* 107 108 108 102  CO2 23 25  --  22 25  GLUCOSE 99 90 90 107* 104*  BUN 5* 16 17 13 13   CALCIUM 8.7* 9.4  --  9.1 9.4  CREATININE 0.58 0.86 0.80 0.59 0.83  GFRNONAA >60 >60  --  >60 >60     LIVER FUNCTION TESTS: Recent Labs    08/25/20 0630 08/26/20 0606 08/31/20 2100 09/01/20 0500  BILITOT 0.6 0.6 0.4 0.5  AST 15 13* 15 14*  ALT 16 11 14 13   ALKPHOS 60 49 77 66  PROT 6.7 5.7* 7.7 7.1  ALBUMIN 3.4* 3.2* 3.9 3.8     Assessment and Plan:  60 yo female s/p successful endovascular embolization of a 7 mm irregularly-shaped wide neck anterior communicating artery aneurysm with stent assisted coiling using "Y" stenting technique with Dr. Karenann Cai on 08/26/20,  patient discharged home neurologically intact with instructions to continue on ASA 81 mg daily and Brilinta 90 mg b.i.d.  She was brought to [facility] ED due to left leg weakness and AMS, was not found to have acute right ACA territory infarct.  Patient is currently hospitalized at [facility].   Imaging reviewed by Dr. Karenann Cai, the acute infarct most likely due to development of in-stent thrombosis.   Patient still appears to be lethargic, but better than yesterday. Patient's left-sided weakness appears to be better  today.  Patient was not able to move her left leg at all yesterday; however, was able to lift the left leg off the bed today.  No facial asymmetry, tongue midline Positive left pronator drift  P2Y12 was sent out yesterday, results pending. Unclear how reflective P2Y12 would be as it was obtained after giving patient Brilinta during this hospitalization -would not accurately reflect the effect of  Brilinta prior to current hospitalization.  Continue to take ASA 81 mg daily and Brilinta 90 mg twice daily.  Further treatment plan per TRH/Nerology  Appreciate and agree with the plan.  NIR to follow.    Electronically Signed: Tera Mater, PA-C 09/02/2020, 10:17 AM   I spent a total of 35 Minutes at the the patient's bedside AND on the patient's hospital floor or unit, greater than 50% of which was counseling/coordinating care for possible in-stent thrombosis

## 2020-09-02 NOTE — Progress Notes (Addendum)
STROKE TEAM PROGRESS NOTE   ATTENDING NOTE: I reviewed above note and agree with the assessment and plan. Pt was seen and examined.   60 year old female with history of HLD, cerebral aneurysms including recently treated ACOM aneurysm status post coiling and stenting on 08/26/2020, untreated left MCA bifurcation 8 mm aneurysm admitted for bilateral lower extremity weakness left more than right.  MRI showed right ACA confluent large infarct, left ACA small infarct at cingulate gyrus.  CT head and neck showed ACOM aneurysm status post coiling and untreated left MCA bifurcation aneurysm.  EF 65 to 70%.  LDL 131, A1c 5.3, UDS negative.  Creatinine 0.83, WBC 12.2, hemoglobin 8.7.  Patient stated that she is taking aspirin and Brilinta compliant, P2 Y 12 pending.  On exam, patient awake alert, psychomotor slowing, mild abulia, orientated x3.  No gaze palsy, no aphasia, paucity of speech, follows simple commands.  Able to name and repeat.  No facial droop, visual field full.  Bilateral upper extremity 5/5.  Left lower extremity proximal 3+/5, distal 4/5, right lower extremity proximal 5/5, distal 4+/5.  Sensation symmetrical, finger-to-nose intact.  Etiology of patient's stroke likely due to [facility] stent occlusion s/p coiling.  Patient on aspirin and Brilinta at home, not sure how compliant, pending P2 Y 12 level.  Currently on aspirin Brilinta, continue on discharge.  Continue Lipitor 80.  PT/OT recommend CIR.  For detailed assessment and plan, please refer to above as I have made changes wherever appropriate.   Betty Hawking, Betty Archer Stroke Neurology 09/02/2020 6:47 PM    INTERVAL HISTORY No one is at the bedside. Left leg is weaker than right, but she notes improvement from yesterday. Stroke work up underway.   Vitals:   09/01/20 1558 09/01/20 2053 09/01/20 2328 09/02/20 0416  BP: 120/68 132/72 108/66 130/66  Pulse: (!) 54 72 88 92  Resp: 19 20 16 16   Temp:  99.1 F (37.3 C) 98.9 F (37.2 C) 99.4 F  (37.4 C)  TempSrc:  Axillary Oral Oral  SpO2:  100% 100% 100%  Weight:      Height:       CBC:  Recent Labs  Lab 08/31/20 2100 08/31/20 2148 09/01/20 0500 09/02/20 0244  WBC 9.7  --  8.8 12.2*  NEUTROABS 5.6  --   --  9.3*  HGB 9.3*   < > 8.9* 8.7*  HCT 30.1*   < > 28.5* 28.3*  MCV 78.8*  --  78.5* 78.2*  PLT 246  --  220 262   < > = values in this interval not displayed.   Basic Metabolic Panel:  Recent Labs  Lab 09/01/20 0500 09/02/20 0244  NA 138 135  K 4.0 3.8  CL 108 102  CO2 22 25  GLUCOSE 107* 104*  BUN 13 13  CREATININE 0.59 0.83  CALCIUM 9.1 9.4  MG 2.0 2.1  PHOS 3.8  --    Lipid Panel:  Recent Labs  Lab 09/01/20 0500  CHOL 198  TRIG 82  HDL 51  CHOLHDL 3.9  VLDL 16  LDLCALC 131*   HgbA1c:  Recent Labs  Lab 09/01/20 0500  HGBA1C 5.3   Urine Drug Screen:  Recent Labs  Lab 08/31/20 2133  LABOPIA NONE DETECTED  COCAINSCRNUR NONE DETECTED  LABBENZ NONE DETECTED  AMPHETMU NONE DETECTED  THCU NONE DETECTED  LABBARB NONE DETECTED    Alcohol Level No results for input(s): ETH in the last 168 hours.  IMAGING past 24 hours No results found.  PHYSICAL EXAM General: Appears well-developed ; no acute distress. Psych: Affect appropriate to situation; seems flat/depressed today.  Eyes: No scleral injection HENT: No OP obstrucion Head: Normocephalic.  Cardiovascular: Normal rate and regular rhythm.  Respiratory: Effort normal and breath sounds normal to anterior ascultation GI: Soft.  No distension. There is no tenderness.  Skin: WDI    Neurological Examination Mental Status: Alert, oriented, thought content appropriate.  Speech fluent without evidence of aphasia. Able to follow 3 step commands without difficulty. Cranial Nerves: II: Visual fields grossly normal,  III,IV, VI: ptosis not present, extra-ocular motions intact bilaterally, pupils equal, round, reactive to light and accommodation V,VII: smile symmetric, facial light touch  sensation normal bilaterally VIII: hearing normal bilaterally IX,X: uvula rises symmetrically XI: bilateral shoulder shrug XII: midline tongue extension Motor: Right : Upper extremity   5/5    Left:     Upper extremity   5/5  Lower extremity   4+/5    Lower extremity   3/5 Tone and bulk:normal tone throughout; no atrophy noted Sensory: Pinprick and light touch intact throughout, bilaterally Deep Tendon Reflexes: 2+ and symmetric throughout Plantars: Right: downgoing   Left: downgoing Cerebellar: normal finger-to-nose, no ataxia outside of the LE weakness Gait: normal gait and station   ASSESSMENT/PLAN Betty Archer is a 60 y.o. female with history of HLD who had recent embolization and coiling of the R ACA aneurysm on 08/25/20 and d/c home. who presents with R ACA territory and a small L ACA territory infarct. Her neurologic examination is notable for L lower extremity flaccid with mild weakness of RLE. Could potentially be an embolic/thrombic infarct of the R ACA secondary to recent aneurysm repair.  Stroke: large Right ACA and small left ACA infarcts, likely related to recent ACOM aneurysm post coiling and stenting, pt stated compliance with meds Code Stroke CT head No acute abnormality. ASPECTS 10.    CTA head & neck 1. No emergent large vessel occlusion or high-grade stenosis. 2. 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 3. Coil mass at the anterior communicating artery without visible residual aneurysm filling. 4. Aberrant right subclavian artery. MRI  Confluent Acute Right ACA territory infarct. This includes involvement of the anterior basal ganglia (medial striate artery territory). Ischemia also in the Left ACA territory but limited to cortex of the cingulate gyrus. 2. Cytotoxic edema but no associated hemorrhage or significant mass effect. 2D Echo 02% EF, Gr 2 diastolic dysfunction.  LDL 131 HgbA1c 5.3 P2Y12 pending VTE prophylaxis - SCDs aspirin 81 mg daily  and Brilinta (ticagrelor) 90 mg bid prior to admission, now on aspirin 81 mg daily and Brilinta (ticagrelor) 90 mg bid. She states she has not missed any doses at home.  Therapy recommendations:  CIR Disposition:  pending  Cerebral aneurysms Recent coiling and stenting of right ACOM aneurysm with Dr. Norma Fredrickson  untreated left MCA bifurcation 8 mm aneurysm IR following  Hypertension Home meds:  none Stable Permissive hypertension (OK if < 220/120) but gradually normalize in 5-7 days Long-term BP goal normotensive  Hyperlipidemia Home meds:  none LDL 131, goal < 70 Add lipitor 80mg   High intensity statin  Continue statin at discharge  Other Stroke Risk Factors Cigarette smoker; advised to stop smoking ETOH use, alcohol level No results found for requested labs within last 26280 hours., advised to stop daily drinking.  Obesity, Body mass index is 31.95 kg/m., BMI >/= 30 associated with increased stroke risk, recommend weight loss, diet and exercise as appropriate  Other [facility] day # 1  Desiree Metzger-Cihelka, ARNP-C, ANVP-BC Pager: [phone]   To contact Stroke Continuity provider, please refer to [email or web address] After hours, contact General Neurology

## 2020-09-02 NOTE — Progress Notes (Signed)
Inpatient Rehabilitation Admissions Coordinator   I met at bedside with son, [place] and patient . We discussed goal and expectations of a possible [facility] admit .He is in agreement. I await further progress/participation with therapies before pursuing admit. I will contact Med assist to assist with disability and Medicaid applications. I will follow up tomorrow.  Danne Baxter, RN, MSN Rehab Admissions Coordinator [phone] 09/02/2020 1:46 PM

## 2020-09-03 ENCOUNTER — Encounter (HOSPITAL_COMMUNITY): Payer: Self-pay | Admitting: Physical Medicine & Rehabilitation

## 2020-09-03 ENCOUNTER — Other Ambulatory Visit: Payer: Self-pay

## 2020-09-03 ENCOUNTER — Inpatient Hospital Stay (HOSPITAL_COMMUNITY)
Admission: RE | Admit: 2020-09-03 | Discharge: 2020-09-24 | DRG: 057 | Disposition: A | Discharge status: Home / Self Care | Payer: Self-pay | Source: Intra-hospital | Attending: Physical Medicine & Rehabilitation | Admitting: Physical Medicine & Rehabilitation

## 2020-09-03 DIAGNOSIS — Z90711 Acquired absence of uterus with remaining cervical stump: Secondary | ICD-10-CM

## 2020-09-03 DIAGNOSIS — Z803 Family history of malignant neoplasm of breast: Secondary | ICD-10-CM

## 2020-09-03 DIAGNOSIS — G8194 Hemiplegia, unspecified affecting left nondominant side: Secondary | ICD-10-CM

## 2020-09-03 DIAGNOSIS — I69351 Hemiplegia and hemiparesis following cerebral infarction affecting right dominant side: Principal | ICD-10-CM

## 2020-09-03 DIAGNOSIS — Z7982 Long term (current) use of aspirin: Secondary | ICD-10-CM

## 2020-09-03 DIAGNOSIS — I63529 Cerebral infarction due to unspecified occlusion or stenosis of unspecified anterior cerebral artery: Secondary | ICD-10-CM

## 2020-09-03 DIAGNOSIS — Z8673 Personal history of transient ischemic attack (TIA), and cerebral infarction without residual deficits: Secondary | ICD-10-CM | POA: Diagnosis present

## 2020-09-03 DIAGNOSIS — I10 Essential (primary) hypertension: Secondary | ICD-10-CM | POA: Diagnosis present

## 2020-09-03 DIAGNOSIS — E785 Hyperlipidemia, unspecified: Secondary | ICD-10-CM | POA: Diagnosis present

## 2020-09-03 DIAGNOSIS — Z8249 Family history of ischemic heart disease and other diseases of the circulatory system: Secondary | ICD-10-CM

## 2020-09-03 DIAGNOSIS — Z801 Family history of malignant neoplasm of trachea, bronchus and lung: Secondary | ICD-10-CM

## 2020-09-03 DIAGNOSIS — I671 Cerebral aneurysm, nonruptured: Secondary | ICD-10-CM

## 2020-09-03 DIAGNOSIS — I639 Cerebral infarction, unspecified: Secondary | ICD-10-CM

## 2020-09-03 DIAGNOSIS — F17210 Nicotine dependence, cigarettes, uncomplicated: Secondary | ICD-10-CM | POA: Diagnosis present

## 2020-09-03 DIAGNOSIS — K5900 Constipation, unspecified: Secondary | ICD-10-CM | POA: Diagnosis present

## 2020-09-03 LAB — RENAL FUNCTION PANEL
Albumin: 3.4 g/dL — ABNORMAL LOW (ref 3.5–5.0)
Anion gap: 7 (ref 5–15)
BUN: 11 mg/dL (ref 6–20)
CO2: 24 mmol/L (ref 22–32)
Calcium: 9.5 mg/dL (ref 8.9–10.3)
Chloride: 105 mmol/L (ref 98–111)
Creatinine, Ser: 0.64 mg/dL (ref 0.44–1.00)
GFR, Estimated: >60 mL/min (ref >60)
Glucose, Bld: 90 mg/dL (ref 70–99)
Phosphorus: 4.1 mg/dL (ref 2.5–4.6)
Potassium: 3.6 mmol/L (ref 3.5–5.1)
Sodium: 136 mmol/L (ref 135–145)

## 2020-09-03 LAB — CBC
HCT: 28.2 % — ABNORMAL LOW (ref 36.0–46.0)
Hemoglobin: 8.7 g/dL — ABNORMAL LOW (ref 12.0–15.0)
MCH: 24.1 pg — ABNORMAL LOW (ref 26.0–34.0)
MCHC: 30.9 g/dL (ref 30.0–36.0)
MCV: 78.1 fL — ABNORMAL LOW (ref 80.0–100.0)
Platelets: 255 10*3/uL (ref 150–400)
RBC: 3.61 MIL/uL — ABNORMAL LOW (ref 3.87–5.11)
RDW: 16.3 % — ABNORMAL HIGH (ref 11.5–15.5)
WBC: 9.9 10*3/uL (ref 4.0–10.5)
nRBC: 0 % (ref 0.0–0.2)

## 2020-09-03 LAB — TSH: TSH: 1.198 u[IU]/mL (ref 0.350–4.500)

## 2020-09-03 LAB — MAGNESIUM: Magnesium: 2.1 mg/dL (ref 1.7–2.4)

## 2020-09-03 LAB — VITAMIN B12: Vitamin B-12: 329 pg/mL (ref 180–914)

## 2020-09-03 MED ORDER — FLUOXETINE HCL 20 MG PO CAPS
20.0000 mg | ORAL_CAPSULE | Freq: Every day | ORAL | Status: DC
  Administered 2020-09-03: 20 mg via ORAL
  Filled 2020-09-03: qty 1

## 2020-09-03 MED ORDER — FLUOXETINE HCL 20 MG PO CAPS: 20.0000 mg | ORAL_CAPSULE | Freq: Every day | ORAL | 3 refills | Status: DC

## 2020-09-03 MED ORDER — SENNOSIDES-DOCUSATE SODIUM 8.6-50 MG PO TABS: 1.0000 | ORAL_TABLET | Freq: Two times a day (BID) | ORAL | 0 refills | Status: DC | PRN

## 2020-09-03 MED ORDER — ASPIRIN 81 MG PO CHEW
81.0000 mg | CHEWABLE_TABLET | Freq: Every day | ORAL | Status: DC
  Administered 2020-09-04 – 2020-09-24 (×21): 81 mg via ORAL
  Filled 2020-09-03 (×21): qty 1

## 2020-09-03 MED ORDER — FERROUS SULFATE 325 (65 FE) MG PO TABS: 325.0000 mg | ORAL_TABLET | Freq: Two times a day (BID) | ORAL | 3 refills | Status: DC

## 2020-09-03 MED ORDER — ATORVASTATIN CALCIUM 80 MG PO TABS: 80.0000 mg | ORAL_TABLET | Freq: Every day | ORAL | 1 refills | Status: DC

## 2020-09-03 MED ORDER — ATORVASTATIN CALCIUM 80 MG PO TABS
80.0000 mg | ORAL_TABLET | Freq: Every day | ORAL | Status: DC
  Administered 2020-09-03 – 2020-09-23 (×21): 80 mg via ORAL
  Filled 2020-09-03 (×21): qty 1

## 2020-09-03 MED ORDER — TICAGRELOR 90 MG PO TABS
90.0000 mg | ORAL_TABLET | Freq: Two times a day (BID) | ORAL | Status: DC
  Administered 2020-09-03 – 2020-09-24 (×42): 90 mg via ORAL
  Filled 2020-09-03 (×42): qty 1

## 2020-09-03 MED ORDER — ACETAMINOPHEN 325 MG PO TABS
650.0000 mg | ORAL_TABLET | Freq: Four times a day (QID) | ORAL | Status: DC | PRN
  Administered 2020-09-03 – 2020-09-23 (×6): 650 mg via ORAL
  Filled 2020-09-03 (×8): qty 2

## 2020-09-03 MED ORDER — LIDOCAINE 5 % EX PTCH
1.0000 | MEDICATED_PATCH | CUTANEOUS | Status: DC
  Administered 2020-09-04 – 2020-09-24 (×21): 1 via TRANSDERMAL
  Filled 2020-09-03 (×23): qty 1

## 2020-09-03 NOTE — Progress Notes (Addendum)
Referring Physician(s): Donnetta Simpers  Supervising Physician: Pedro Earls  Patient Status:  Betty Archer - In-pt  Chief Complaint:  Weakness on left leg patient is s/p successful endovascular embolization of a 7 mm irregularly-shaped wide neck anterior communicating artery aneurysm with stent assisted coiling using "Y" stenting technique with Dr. Karenann Cai on 10/25/16, case was complicated by periprocedural bleeding from the puncture site, bleeding was treated with extended compression.   Patient was discharged home in stable condition, neurologically intact, with ASA 81 mg daily and Brilinta 90 mg twice daily.   Subjective:  Patient sitting in bed, finishing her breakfast, family member at the bedside.   Breakfast placed appears empty. Patient alert and oriented x4, did not fall asleep during assessment. States that she is doing fine.  Allergies: Patient has no known allergies.  Medications: Prior to Admission medications   Medication Sig Start Date End Date Taking? Authorizing Provider  aspirin 81 MG chewable tablet Chew 81 mg by mouth in the morning and at bedtime.   Yes [provider]  docusate sodium (COLACE) 100 MG capsule Take 1 capsule (100 mg total) by mouth 2 (two) times daily. Patient taking differently: Take 100 mg by mouth 2 (two) times daily as needed for mild constipation. 08/26/20  Yes Docia Barrier, PA  ferrous sulfate 325 (65 FE) MG tablet Take 1 tablet (325 mg total) by mouth daily with breakfast. Patient taking differently: Take 325 mg by mouth daily. 08/26/20  Yes Docia Barrier, PA  ticagrelor (BRILINTA) 90 MG TABS tablet Take 90 mg by mouth 2 (two) times daily.   Yes [provider]     Vital Signs: BP 104/62 (BP Location: Right Arm)   Pulse 60   Temp 98.2 F (36.8 C) (Oral)   Resp 18   Ht 5\' 6"  (1.676 m)   Wt 197 lb 15.6 oz (89.8 kg)   SpO2 100%   BMI 31.95 kg/m   Physical  Exam Vitals reviewed.  Constitutional:      General: She is not in acute distress.    Appearance: Normal appearance. She is not ill-appearing.     Comments: Appears lethargic, keeps falling asleep during assessment.  Appears less lethargic than yesterday.  HENT:     Head: Normocephalic and atraumatic.  Pulmonary:     Effort: Pulmonary effort is normal.  Skin:    General: Skin is warm and dry.     Coloration: Skin is not jaundiced or pale.  Neurological:     Mental Status: She is alert and oriented to person, place, and time.     Motor: Weakness present.     Comments: Alert and oriented to self, place, time, situation.  Able to state the current president of [place] correctly. No facial asymmetry. Tongue midline. Able to follow commands. Strength in upper extremity asymmetric, right stronger than left. Negative pronator drift on left arm. Right lower leg strength intact, patient was able to lift the left leg off the bed today.   Psychiatric:        Mood and Affect: Mood normal.        Behavior: Behavior normal.    Imaging: MR BRAIN WO CONTRAST  Result Date: 09/01/2020 CLINICAL DATA:  60 year old female with lower extremity tingling. Encephalopathy. Status post coil embolization of anterior communicating artery aneurysm 1 week ago. 8 mm left MCA bifurcation aneurysm. EXAM: MRI HEAD WITHOUT CONTRAST TECHNIQUE: Multiplanar, multiecho pulse sequences of the brain and surrounding structures  were obtained without intravenous contrast. COMPARISON:  CTA head and neck yesterday and earlier. Brain MRI 08/13/2013. FINDINGS: Brain: Confluent restricted diffusion in the right ACA territory (series 5, image 63), but also affecting some cortex of the cingulate gyrus on the left (series 12, image 48). And there is also dense restricted diffusion in the anterior right basal ganglia related to penetrating arteries of the proximal ACA. Posteriorly on the right a small area of medial perirolandic  cortex is also affected (series 5, image 89). Questionable also punctate abnormal diffusion in the right superior cerebellum on series 5, image 68, but not correlated on the coronal images. T2 and FLAIR hyperintensity in keeping with cytotoxic edema. No parenchymal hemorrhage or significant mass effect. No midline shift or ventriculomegaly. Small widely scattered cerebral white matter T2 and FLAIR hyperintense foci are in a similar pattern to the 2015 MRI, mildly progressed since that time. No chronic cortical encephalomalacia or definite chronic cerebral blood products. No midline shift, evidence of mass lesion, extra-axial collection or acute intracranial hemorrhage. Basilar cisterns are patent. Cervicomedullary junction and pituitary are within normal limits. Vascular: Major intracranial vascular flow voids are stable since 2015. T2* susceptibility artifact related to the anterior communicating artery aneurysm stent assisted coiling. Skull and upper cervical spine: Negative. Visualized bone marrow signal is within normal limits. Sinuses/Orbits: Negative. Paranasal Visualized paranasal sinuses and mastoids are stable and well aerated. Other: Negative visible scalp and face soft tissues. IMPRESSION: 1. Confluent Acute Right ACA territory infarct. This includes involvement of the anterior basal ganglia (medial striate artery territory). Ischemia also in the Left ACA territory but limited to cortex of the cingulate gyrus. 2. Cytotoxic edema but no associated hemorrhage or significant mass effect. Electronically Signed   By: Genevie Ann M.D.   On: 09/01/2020 07:53   ECHOCARDIOGRAM COMPLETE  Result Date: 09/02/2020    ECHOCARDIOGRAM REPORT   Patient Name:   Betty Archer Date of Exam: 09/02/2020 Medical Rec #:  [id number]      Height:       66.0 in Accession #:    [id number]     Weight:       198.0 lb Date of Birth:  Oct 31, 1960      BSA:          1.991 m Patient Age:    60 years       BP:           130/66 mmHg Patient  Gender: F              HR:           64 bpm. Exam Location:  Inpatient Procedure: 2D Echo, Color Doppler and Cardiac Doppler Indications:    Stroke I63.9  History:        Patient has prior history of Echocardiogram examinations, most                 recent 08/13/2013. Risk Factors:Dyslipidemia and Diabetes.  Sonographer:    Bernadene Person RDCS Referring Phys: [id number] OLADAPO ADEFESO IMPRESSIONS  1. Left ventricular ejection fraction, by estimation, is 65 to 70%. The left ventricle has normal function. The left ventricle has no regional wall motion abnormalities. Left ventricular diastolic parameters are consistent with Grade II diastolic dysfunction (pseudonormalization).  2. Right ventricular systolic function is normal. The right ventricular size is normal. Tricuspid regurgitation signal is inadequate for assessing PA pressure.  3. The mitral valve is normal in structure. No evidence of mitral valve regurgitation. No evidence  of mitral stenosis.  4. The aortic valve is tricuspid. Aortic valve regurgitation is not visualized. No aortic stenosis is present.  5. The inferior vena cava is normal in size with greater than 50% respiratory variability, suggesting right atrial pressure of 3 mmHg. FINDINGS  Left Ventricle: Left ventricular ejection fraction, by estimation, is 65 to 70%. The left ventricle has normal function. The left ventricle has no regional wall motion abnormalities. The left ventricular internal cavity size was normal in size. There is  no left ventricular hypertrophy. Left ventricular diastolic parameters are consistent with Grade II diastolic dysfunction (pseudonormalization). Right Ventricle: The right ventricular size is normal. No increase in right ventricular wall thickness. Right ventricular systolic function is normal. Tricuspid regurgitation signal is inadequate for assessing PA pressure. Left Atrium: Left atrial size was normal in size. Right Atrium: Right atrial size was normal in size.  Pericardium: There is no evidence of pericardial effusion. Mitral Valve: The mitral valve is normal in structure. No evidence of mitral valve regurgitation. No evidence of mitral valve stenosis. Tricuspid Valve: The tricuspid valve is normal in structure. Tricuspid valve regurgitation is not demonstrated. Aortic Valve: The aortic valve is tricuspid. Aortic valve regurgitation is not visualized. No aortic stenosis is present. Pulmonic Valve: The pulmonic valve was normal in structure. Pulmonic valve regurgitation is not visualized. Aorta: The aortic root is normal in size and structure. Venous: The inferior vena cava is normal in size with greater than 50% respiratory variability, suggesting right atrial pressure of 3 mmHg. IAS/Shunts: No atrial level shunt detected by color flow Doppler.  LEFT VENTRICLE PLAX 2D LVIDd:         3.60 cm  Diastology LVIDs:         2.00 cm  LV e' medial:    6.24 cm/s LV PW:         1.00 cm  LV E/e' medial:  14.6 LV IVS:        1.00 cm  LV e' lateral:   7.50 cm/s LVOT diam:     2.00 cm  LV E/e' lateral: 12.1 LV SV:         84 LV SV Index:   42 LVOT Area:     3.14 cm  RIGHT VENTRICLE RV S prime:     11.60 cm/s TAPSE (M-mode): 2.1 cm LEFT ATRIUM             Index       RIGHT ATRIUM           Index LA diam:        2.20 cm 1.10 cm/m  RA Area:     16.40 cm LA Vol (A2C):   30.4 ml 15.27 ml/m RA Volume:   42.60 ml  21.39 ml/m LA Vol (A4C):   27.5 ml 13.81 ml/m LA Biplane Vol: 31.0 ml 15.57 ml/m  AORTIC VALVE LVOT Vmax:   137.00 cm/s LVOT Vmean:  90.600 cm/s LVOT VTI:    0.267 m  AORTA Ao Root diam: 3.10 cm Ao Asc diam:  3.20 cm MITRAL VALVE MV Area (PHT): 3.85 cm    SHUNTS MV Decel Time: 197 msec    Systemic VTI:  0.27 m MV E velocity: 90.80 cm/s  Systemic Diam: 2.00 cm MV A velocity: 51.00 cm/s MV E/A ratio:  1.78 Loralie Champagne MD Electronically signed by Loralie Champagne MD Signature Date/Time: 09/02/2020/2:38:04 PM    Final    CT HEAD CODE STROKE WO CONTRAST  Result Date:  08/31/2020 CLINICAL DATA:  Code stroke. Lower extremity tingling and encephalopathy EXAM: CT HEAD WITHOUT CONTRAST TECHNIQUE: Contiguous axial images were obtained from the base of the skull through the vertex without intravenous contrast. COMPARISON:  Head CT 08/08/2020 FINDINGS: Brain: There is no mass, hemorrhage or extra-axial collection. The size and configuration of the ventricles and extra-axial CSF spaces are normal. The brain parenchyma is normal, without evidence of acute or chronic infarction. Vascular: Aneurysm clips at the A-comm. Skull: The visualized skull base, calvarium and extracranial soft tissues are normal. Sinuses/Orbits: No fluid levels or advanced mucosal thickening of the visualized paranasal sinuses. No mastoid or middle ear effusion. The orbits are normal. ASPECTS [facility] Stroke Program Early CT Score) - Ganglionic level infarction (caudate, lentiform nuclei, internal capsule, insula, M1-M3 cortex): 7 - Supraganglionic infarction (M4-M6 cortex): 3 Total score (0-10 with 10 being normal): 10 IMPRESSION: 1. No acute intracranial abnormality. 2. ASPECTS is 10. These results were called by telephone at the time of interpretation on 08/31/2020 at 10:07 pm to provider [facility] , who verbally acknowledged these results. Electronically Signed   By: Ulyses Jarred M.D.   On: 08/31/2020 22:07   CT ANGIO HEAD CODE STROKE  Result Date: 08/31/2020 CLINICAL DATA:  Lower extremity tingling and encephalopathy. EXAM: CT ANGIOGRAPHY HEAD AND NECK TECHNIQUE: Multidetector CT imaging of the head and neck was performed using the standard protocol during bolus administration of intravenous contrast. Multiplanar CT image reconstructions and MIPs were obtained to evaluate the vascular anatomy. Carotid stenosis measurements (when applicable) are obtained utilizing NASCET criteria, using the distal internal carotid diameter as the denominator. CONTRAST:  139mL OMNIPAQUE IOHEXOL 350 MG/ML SOLN COMPARISON:   08/08/2020 FINDINGS: CTA NECK FINDINGS SKELETON: There is no bony spinal canal stenosis. No lytic or blastic lesion. OTHER NECK: Normal pharynx, larynx and major salivary glands. No cervical lymphadenopathy. Unremarkable thyroid gland. UPPER CHEST: No pneumothorax or pleural effusion. No nodules or masses. AORTIC ARCH: There is calcific atherosclerosis of the aortic arch. There is no aneurysm, dissection or hemodynamically significant stenosis of the visualized portion of the aorta. Aberrant right subclavian artery. the visualized proximal subclavian arteries are widely patent. RIGHT CAROTID SYSTEM: Normal without aneurysm, dissection or stenosis. LEFT CAROTID SYSTEM: Normal without aneurysm, dissection or stenosis. VERTEBRAL ARTERIES: Left dominant configuration. Both origins are clearly patent. There is no dissection, occlusion or flow-limiting stenosis to the skull base (V1-V3 segments). CTA HEAD FINDINGS POSTERIOR CIRCULATION: --Vertebral arteries: Normal V4 segments. --Inferior cerebellar arteries: Normal. --Basilar artery: Normal. --Superior cerebellar arteries: Normal. --Posterior cerebral arteries (PCA): Normal. ANTERIOR CIRCULATION: --Intracranial internal carotid arteries: Normal. --Anterior cerebral arteries (ACA): Coil mass at the anterior communicating artery. No visible residual aneurysm filling. --Middle cerebral arteries (MCA): Unchanged 8 mm aneurysm of the left MCA bifurcation. Normal right MCA. No occlusion. VENOUS SINUSES: As permitted by contrast timing, patent. ANATOMIC VARIANTS: None Review of the MIP images confirms the above findings. IMPRESSION: 1. No emergent large vessel occlusion or high-grade stenosis. 2. 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 3. Coil mass at the anterior communicating artery without visible residual aneurysm filling. 4. Aberrant right subclavian artery. Aortic Atherosclerosis (ICD10-I70.0). Electronically Signed   By: Ulyses Jarred M.D.   On: 08/31/2020 22:49    CT ANGIO NECK CODE STROKE  Result Date: 08/31/2020 CLINICAL DATA:  Lower extremity tingling and encephalopathy. EXAM: CT ANGIOGRAPHY HEAD AND NECK TECHNIQUE: Multidetector CT imaging of the head and neck was performed using the standard protocol during bolus administration of intravenous contrast. Multiplanar CT image reconstructions and  MIPs were obtained to evaluate the vascular anatomy. Carotid stenosis measurements (when applicable) are obtained utilizing NASCET criteria, using the distal internal carotid diameter as the denominator. CONTRAST:  197mL OMNIPAQUE IOHEXOL 350 MG/ML SOLN COMPARISON:  08/08/2020 FINDINGS: CTA NECK FINDINGS SKELETON: There is no bony spinal canal stenosis. No lytic or blastic lesion. OTHER NECK: Normal pharynx, larynx and major salivary glands. No cervical lymphadenopathy. Unremarkable thyroid gland. UPPER CHEST: No pneumothorax or pleural effusion. No nodules or masses. AORTIC ARCH: There is calcific atherosclerosis of the aortic arch. There is no aneurysm, dissection or hemodynamically significant stenosis of the visualized portion of the aorta. Aberrant right subclavian artery. the visualized proximal subclavian arteries are widely patent. RIGHT CAROTID SYSTEM: Normal without aneurysm, dissection or stenosis. LEFT CAROTID SYSTEM: Normal without aneurysm, dissection or stenosis. VERTEBRAL ARTERIES: Left dominant configuration. Both origins are clearly patent. There is no dissection, occlusion or flow-limiting stenosis to the skull base (V1-V3 segments). CTA HEAD FINDINGS POSTERIOR CIRCULATION: --Vertebral arteries: Normal V4 segments. --Inferior cerebellar arteries: Normal. --Basilar artery: Normal. --Superior cerebellar arteries: Normal. --Posterior cerebral arteries (PCA): Normal. ANTERIOR CIRCULATION: --Intracranial internal carotid arteries: Normal. --Anterior cerebral arteries (ACA): Coil mass at the anterior communicating artery. No visible residual aneurysm filling.  --Middle cerebral arteries (MCA): Unchanged 8 mm aneurysm of the left MCA bifurcation. Normal right MCA. No occlusion. VENOUS SINUSES: As permitted by contrast timing, patent. ANATOMIC VARIANTS: None Review of the MIP images confirms the above findings. IMPRESSION: 1. No emergent large vessel occlusion or high-grade stenosis. 2. 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 3. Coil mass at the anterior communicating artery without visible residual aneurysm filling. 4. Aberrant right subclavian artery. Aortic Atherosclerosis (ICD10-I70.0). Electronically Signed   By: Ulyses Jarred M.D.   On: 08/31/2020 22:49    Labs:  CBC: Recent Labs    08/31/20 2100 08/31/20 2148 09/01/20 0500 09/02/20 0244 09/03/20 0157  WBC 9.7  --  8.8 12.2* 9.9  HGB 9.3* 9.2* 8.9* 8.7* 8.7*  HCT 30.1* 27.0* 28.5* 28.3* 28.2*  PLT 246  --  220 262 255     COAGS: Recent Labs    08/25/20 0630 08/31/20 2100 09/01/20 0500  INR 1.0 1.0 1.0  APTT 31 33 33     BMP: Recent Labs    08/31/20 2100 08/31/20 2148 09/01/20 0500 09/02/20 0244 09/03/20 0157  NA 140 140 138 135 136  K 3.7 4.3 4.0 3.8 3.6  CL 107 108 108 102 105  CO2 25  --  22 25 24   GLUCOSE 90 90 107* 104* 90  BUN 16 17 13 13 11   CALCIUM 9.4  --  9.1 9.4 9.5  CREATININE 0.86 0.80 0.59 0.83 0.64  GFRNONAA >60  --  >60 >60 >60     LIVER FUNCTION TESTS: Recent Labs    08/25/20 0630 08/26/20 0606 08/31/20 2100 09/01/20 0500 09/03/20 0157  BILITOT 0.6 0.6 0.4 0.5  --   AST 15 13* 15 14*  --   ALT 16 11 14 13   --   ALKPHOS 60 49 77 66  --   PROT 6.7 5.7* 7.7 7.1  --   ALBUMIN 3.4* 3.2* 3.9 3.8 3.4*     Assessment and Plan:  60 yo female s/p successful endovascular embolization of a 7 mm irregularly-shaped wide neck anterior communicating artery aneurysm with stent assisted coiling using "Y" stenting technique with Dr. Karenann Cai on 08/26/20,  patient discharged home neurologically intact with instructions to continue on ASA  81 mg  daily and Brilinta 90 mg b.i.d.  She was brought to [facility] ED due to left leg weakness and AMS, was found to have acute right ACA territory infarct.  Patient is currently hospitalized at [facility].   Imaging reviewed by Dr. Karenann Cai, the acute infarct most likely due to development of in-stent thrombosis.   Patient appears alert and oriented x4, significantly better than yesterday.   Patient understands the situation, states that she came to the hospital because her left leg gave up.  Persistent left-sided weakness present. No facial asymmetry, tongue midline Negative pronator drift bilaterally today.   P2Y12 was sent out on 09/01/2020, results still pending. Unclear how reflective P2Y12 would be as it was obtained after giving patient Brilinta during this hospitalization -would not accurately reflect the effect of  Brilinta prior to current hospitalization.  Chart review revealed that patient had a COVID-19 testing on 08/24/2020 at the pretesting center the day before the procedure, result was delayed due to malfunctioning testing machine. The rapid COVID-19 testing at short stay on the day of the procedure (08/25/20) was negative, therefore the procedure was executed as planned. However, the COVID-19 test that was obtained at the pretesting center came back positive on 08/25/2020 11:45 am, after the procedure was underway.  The development of in-stent thrombosis is most likely due to hypercoagulable state related to COVID-19 infection.  Plan:  Continue to take ASA 81 mg daily and Brilinta 90 mg twice daily, may consider adjusting once P2Y12 result finalizes. Patient will need rehab for the left leg weakness.   Further treatment plan per [facility]/Nerology  Appreciate and agree with the plan.  Please call NIR for questions and concerns over weekend.   Electronically Signed: Tera Mater, PA-C 09/03/2020, 11:14 AM   I spent a total of 35 Minutes at the the patient's bedside AND on the patient's  hospital floor or unit, greater than 50% of which was counseling/coordinating care for possible in-stent thrombosis

## 2020-09-03 NOTE — Plan of Care (Signed)
  Problem: Education: Goal: Knowledge of disease or condition will improve Outcome: Adequate for Discharge Goal: Knowledge of secondary prevention will improve Outcome: Adequate for Discharge Goal: Knowledge of patient specific risk factors addressed and post discharge goals established will improve Outcome: Adequate for Discharge Goal: Individualized Educational Video(s) Outcome: Adequate for Discharge   Problem: Coping: Goal: Will verbalize positive feelings about self Outcome: Adequate for Discharge Goal: Will identify appropriate support needs Outcome: Adequate for Discharge   Problem: Self-Care: Goal: Ability to participate in self-care as condition permits will improve Outcome: Adequate for Discharge Goal: Verbalization of feelings and concerns over difficulty with self-care will improve Outcome: Adequate for Discharge Goal: Ability to communicate needs accurately will improve Outcome: Adequate for Discharge   Problem: Ischemic Stroke/TIA Tissue Perfusion: Goal: Complications of ischemic stroke/TIA will be minimized Outcome: Adequate for Discharge   Problem: Intracerebral Hemorrhage Tissue Perfusion: Goal: Complications of Intracerebral Hemorrhage will be minimized Outcome: Adequate for Discharge   Problem: Spontaneous Subarachnoid Hemorrhage Tissue Perfusion: Goal: Complications of Spontaneous Subarachnoid Hemorrhage will be minimized Outcome: Adequate for Discharge

## 2020-09-03 NOTE — Progress Notes (Signed)
Inpatient Rehabilitation Admissions Coordinator   CIR bed is available to admit patient to today. I met at bedside with patient and her son, [name] and they are I agreement to admit. Estimate of cost of care provided. I will alert acute team and TOC to make the arrangements to admit today.  Danne Baxter, RN, MSN Rehab Admissions Coordinator [phone] 09/03/2020 11:46 AM

## 2020-09-03 NOTE — PMR Pre-admission (Signed)
PMR Admission Coordinator Pre-Admission Assessment  Patient: Betty Archer is an 60 y.o., female MRN: [id number] DOB: 1961-02-04 Height: '5\' 6"'  (167.6 cm) Weight: 89.8 kg  Insurance Information HMO:     PPO:      PCP:      IPA:      80/20:      OTHER:  PRIMARY: uninsured Estimate of cost of care provided to son, Betty Archer at [employer], [employer]: referred to med assist to help with medicaid and disability applications  The Therapist, art Information Summary" for patients in [place] with attached "Privacy Act [place] Records" was provided and verbally reviewed with: N/A  Emergency Contact Information Contact Information     Name Relation Home Work Mobile   Betty Archer Son   [phone]   Betty Archer [phone]  [phone]   Betty Archer   [phone]       Current Medical History  Patient Admitting Diagnosis: CVA  History of Present Illness: Betty Archer. Pack is a 60 year old right-handed female with history of hypertension, hyperlipidemia, tobacco use as well as history of a 6 mm right A2/ACA aneurysm and an 8 mm left MCA bifurcation aneurysm identified after patient with complaints of headache and right arm numbness in June 2022 followed by neurology services and underwent diagnostic cerebral angiogram and right aneurysmal embolization on 08/25/2020 per interventional radiology and left MCA aneurysm unrepaired with course complicated by right groin hematoma that required prolonged initial intubation was discharged home 08/27/2020 maintained on aspirin and Brilinta..   Presented 08/31/2020 with altered mental status and lower extremity weakness.  Cranial CT scan negative for acute changes.  CT angiogram of head and neck no emergent large vessel occlusion or high-grade stenosis.  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  Coil mass at the anterior communicating artery without visible residual aneurysmal filling.  MRI  of the brain showed confluent right acute ACA territory infarction including involvement of the anterior basal ganglia and ischemia also in the left ACA territory with no associated hemorrhage or mass-effect.  Echocardiogram with ejection fraction of 65 to 90% grade 2 diastolic dysfunction no regional wall motion abnormalities.  Admission chemistries were unremarkable, hemoglobin 9.3, urine drug screen negative.  Neurology follow-up currently maintained on aspirin as well as Brilinta for CVA prophylaxis.  Tolerating a regular consistency diet.  Complete NIHSS TOTAL: 4  Patient's medical record from Betty Archer  has been reviewed by the rehabilitation admission coordinator and physician.  Past Medical History  Past Medical History:  Diagnosis Date   Hyperlipidemia     Family History   family history includes Breast cancer in her maternal aunt and maternal grandmother; Hypertension in her mother; Lung cancer in her father.  Prior Rehab/Hospitalizations Has the patient had prior rehab or hospitalizations prior to admission? Yes  Has the patient had major surgery during 100 days prior to admission? Yes   Current Medications  Current Facility-Administered Medications:    acetaminophen (TYLENOL) tablet 650 mg, 650 mg, Oral, Q6H PRN, Betty Archer, Betty Archer, 650 mg at 09/01/20 1321   aspirin chewable tablet 81 mg, 81 mg, Oral, Daily, Betty Archer, Betty Archer, 81 mg at 09/03/20 0857   atorvastatin (LIPITOR) tablet 80 mg, 80 mg, Oral, QHS, Betty Archer, Betty Archer, 80 mg at 09/02/20 2136   FLUoxetine (PROZAC) capsule 20 mg, 20 mg, Oral, Daily, Betty Archer, Desiree, NP   lidocaine (LIDODERM) 5 % 1 patch, 1 patch, Transdermal, Q24H, [place], [place], Betty Archer, 1 patch at 09/03/20 [phone]  ondansetron (ZOFRAN) injection 4 mg, 4 mg, Intravenous, Q6H PRN, Betty Archer, Betty Archer   ticagrelor (BRILINTA) tablet 90 mg, 90 mg, Oral, BID, Betty Archer, Betty Archer, 90 mg at 09/03/20 0857  Patients Current Diet:  Diet Order              Diet - low sodium heart healthy           Diet Heart Room service appropriate? Yes; Fluid consistency: Thin  Diet effective now                   Precautions / Restrictions Precautions Precautions: Fall Precaution Comments: falls asleep easily Restrictions Weight Bearing Restrictions: No   Has the patient had 2 or more falls or a fall with injury in the past year? 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 Independent  Indoor Mobility: Did the patient need assistance with walking from room to room (with or without device)? Independent  Stairs: Did the patient need assistance with internal or external stairs (with or without device)? Independent  Functional Cognition: Did the patient need help planning regular tasks such as shopping or remembering to take medications? Independent  Home Assistive Devices / Equipment Home Assistive Devices/Equipment: Eyeglasses Home Equipment: None  Prior Device Use: Indicate devices/aids used by the patient prior to current illness, exacerbation or injury? None of the above  Current Functional Level Cognition  Overall Cognitive Status: Impaired/Different from baseline Current Attention Level: Focused Orientation Level: Oriented to person, Oriented to place, Oriented to situation, Disoriented to time Following Commands: Follows one step commands with increased time Safety/Judgement: Decreased awareness of deficits General Comments: pt would fall asleep freq t/o session however was appropriate when awake, pt aware she was falling backwards but didn't attempt to self correct requiring max verbal cues    Extremity Assessment (includes Sensation/Coordination)  Upper Extremity Assessment: LUE deficits/detail LUE Deficits / Details: grip trace compared to R grip 3+/5,  unable to support L UE against gravity without assistance. LUE Coordination: decreased fine motor, decreased gross motor  Lower Extremity Assessment: Defer to PT evaluation RLE Deficits / Details: grossly 4 strength, decreased effort, lethargic LLE Deficits / Details: increased tone into extension when rolling, moving to bed edge.  no dorsiflexion noted, trace knee extension LLE Sensation: decreased proprioception, decreased light touch LLE Coordination: decreased gross motor, decreased fine motor    ADLs  Overall ADL's : Needs assistance/impaired Grooming: Moderate assistance, Bed level Upper Body Bathing: Maximal assistance, Bed level Lower Body Bathing: Total assistance, Bed level Upper Body Dressing : Maximal assistance, Bed level Lower Body Dressing: Total assistance, Bed level Toilet Transfer Details (indicate cue type and reason): deferred due to zero sitting balance needing mod to max A to maintain upright position at edge of bed Toileting- Clothing Manipulation and Hygiene: Total assistance, Bed level General ADL Comments: patient requiring significant assistance with all ADL tasks due to decreased strength, ROM, coordination, cognition, balance    Mobility  Overal bed mobility: Needs Assistance Bed Mobility: Supine to Sit Rolling: Total assist, +2 for physical assistance, +2 for safety/equipment Sidelying to sit: +2 for physical assistance, +2 for safety/equipment, Total assist Supine to sit: Mod assist, +2 for physical assistance, HOB elevated Sit to sidelying: Total assist, +2 for physical assistance, +2 for safety/equipment General bed mobility comments: pt able to bring R LE  to EOB, maxA for L LE management, with max verbal cues pt attempted to use L UE to assist but ultimately required modAx2 for trunk elevation and pivoting hips to EOB    Transfers  Overall transfer level: Needs assistance Equipment used: 2 person hand held assist (face to face transfer) Transfers: Sit  to/from Stand, Stand Pivot Transfers Sit to Stand: Mod assist, +2 physical assistance Stand pivot transfers: Mod assist, +2 physical assistance General transfer comment: pt with good power up, modA to maintain balance as pt with posterior bias, max directional verbal cues to sequence stepping to chair, L knee blocked due to instability when advancing R LE but able to stand without buckling    Ambulation / Gait / Stairs / Wheelchair Mobility       Posture / Balance Dynamic Sitting Balance Sitting balance - Comments: pt with posterior lean requiring max directional verbal cues to self correct, utimately requiring modA to return to midline Balance Overall balance assessment: Needs assistance Sitting-balance support: Feet supported, Bilateral upper extremity supported Sitting balance-Leahy Scale: Poor Sitting balance - Comments: pt with posterior lean requiring max directional verbal cues to self correct, utimately requiring modA to return to midline Standing balance support: During functional activity, Bilateral upper extremity supported Standing balance-Leahy Scale: Poor Standing balance comment: dependent on external support    Special needs/care consideration Covid + 7/5. infection PREVENTION REMOVED ISOLATION PRECAUTIONS ON 7/14   Previous Home Environment  Living Arrangements:  (sons, [place] and [place] live with patient)  Lives With: Son (two sons) Available Help at Discharge: Family, Available 24 hours/day ([place] to take [employer]) Type of Home: Apartment Home Layout: One level Home Access: Stairs to enter Entrance Stairs-Rails: Right Entrance Stairs-Number of Steps:  (second floor apartment) Bathroom Shower/Tub: Public librarian, Multimedia programmer: Standard Bathroom Accessibility: Yes How Accessible: Accessible via walker [place]: No Additional Comments: son has been living with patient for past year  Discharge Living Setting Plans for Discharge Living  Setting: Patient's home, Apartment, Lives with (comment) (2 of her 3 sons live with patient) Type of Home at Discharge: Apartment Discharge Home Layout: One level (second floor apartment) Discharge Home Access: Stairs to enter Entrance Stairs-Rails: Right Entrance Stairs-Number of Steps: 15 + Discharge Bathroom Shower/Tub: Tub/shower unit, Walk-in shower Discharge Bathroom Toilet: Standard Discharge Bathroom Accessibility: Yes How Accessible: Accessible via walker Does the patient have any problems obtaining your medications?: Yes (Describe) (uninsured)  Social/Family/Support Systems 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 Information: sons [place] and Betty Archer are main contacts Anticipated Caregiver: sons and family; [place] to take [employer] Anticipated Ambulance person Information: see above Ability/Limitations of Caregiver: sons works but schedules are opposite of one Careers information officer Availability: 24/7 Discharge Plan Discussed with Primary Caregiver: Yes Is Caregiver In Agreement with Plan?: Yes Does Caregiver/Family have Issues with Lodging/Transportation while Pt is in Rehab?: No  Goals Patient/Family Goal for Rehab: supervision with PT, OT and SLP Expected length of stay: ELOS 10 to 14 days Additional Information: post COVID Pt/Family Agrees to Admission and willing to participate: Yes Program Orientation Provided & Reviewed with Pt/Caregiver Including Roles  & Responsibilities: Yes  Decrease burden of Care through IP rehab admission: N/A  Possible need for SNF placement upon discharge: NOT ANTICIPATED  Patient Condition: I have reviewed medical records from [facility] , spoken with CSW, and patient and son. I met with patient at the bedside for inpatient rehabilitation assessment.  Patient will benefit from ongoing PT, OT, and SLP, can actively  participate in 3 hours of therapy a day 5 days of the week, and can make measurable gains during the admission.  Patient  will also benefit from the coordinated team approach during an Inpatient Acute Rehabilitation admission.  The patient will receive intensive therapy as well as Rehabilitation physician, nursing, social worker, and care management interventions.  Due to bladder management, bowel management, safety, skin/wound care, disease management, medication administration, pain management, and patient education the patient requires 24 hour a day rehabilitation nursing.  The patient is currently MOD ASSIST OVERALL with mobility and basic ADLs.  Discharge setting and therapy post discharge at home with outpatient is anticipated.  Patient has agreed to participate in the Acute Inpatient Rehabilitation Program and will admit today.  Preadmission Screen Completed By:  Cleatrice Burke, 09/03/2020 11:55 AM ______________________________________________________________________   Discussed status with Dr. Naaman Plummer on  09/03/2020 at 1202 and received approval for admission today.  Admission Coordinator:  Cleatrice Burke, RN, time 1202 Date 09/03/2020   Assessment/Plan: Diagnosis: Right ACA/left ACA  infarctions Does the need for close, 24 hr/day Medical supervision in concert with the patient's rehab needs make it unreasonable for this patient to be served in a less intensive setting? Yes Co-Morbidities requiring supervision/potential complications: HTN, post-stroke sequelae, post-covid Due to bladder management, bowel management, safety, skin/wound care, disease management, medication administration, pain management, and patient education, does the patient require 24 hr/day rehab nursing? Yes Does the patient require coordinated care of a physician, rehab nurse, PT, OT, and SLP to address physical and functional deficits in the context of the above medical diagnosis(es)? Yes Addressing deficits in the following areas: balance, endurance, locomotion, strength, transferring, bowel/bladder control, bathing,  dressing, feeding, grooming, toileting, cognition, speech, and psychosocial support Can the patient actively participate in an intensive therapy program of at least 3 hrs of therapy 5 days a week? Yes The potential for patient to make measurable gains while on inpatient rehab is excellent Anticipated functional outcomes upon discharge from inpatient rehab: supervision PT, supervision OT, supervision SLP Estimated rehab length of stay to reach the above functional goals is: 10-14 days Anticipated discharge destination: Home 10. Overall Rehab/Functional Prognosis: excellent   Archer Signature: [name], Archer, [place] [facility] 09/03/2020

## 2020-09-03 NOTE — Progress Notes (Signed)
Patient ID: Betty Archer, female   DOB: [birth year], 60 y.o.   MRN: [id number]  SW made efforts to meet with pt to complete assessment, but pt was sleeping. Pt son Mliss Fritz and sister in room. SW introduced self, explain role, and discussed discharge process. Mliss Fritz states his brother [name] is the primary contact. SW explained SW will f/u with [facility] once aware of pt ELOS.   Loralee Pacas, MSW, [place] Office: [phone] Cell: [phone] Fax: [phone]

## 2020-09-03 NOTE — Progress Notes (Signed)
Occupational Therapy Treatment Patient Details Name: Betty Archer MRN: [id number] DOB: [birth year] Today's Date: 09/03/2020    History of present illness 60 y.o. female with medical history significant for hypertension, hyperlipidemia who recently had a repair of right ACA aneurysm, as well as left MCA aneurysm (unrepaired) which resulted in right groin hematoma that required intubation after the procedure with subsequent discharge on 08/27/2020 presented 08/31/20  with altered mental status and leg weakness.  CT -unchanged 8 mm left MCA bifurcation aneurysm.  Teleneurology recommended stroke work-up. MRI-Confluent Acute Right ACA territory infarct. This includes  involvement of the anterior basal ganglia (medial striate artery  territory).  Ischemia also in the Left ACA territory but limited to cortex of the  cingulate gyrus.  2. Cytotoxic edema but no associated hemorrhage or significant mass  effect.   OT comments  Pt seen briefly prior to transfer to CIR to facilitated safety training and seated ADL's. Noted incontinence at bed level requiring max-dep for hygiene in sitting/standing positioning. Improved arousal with transition to sitting, difficulty with sustaining attention while in bed with HOB elevated. Difficulty with sustaining balance at EOB without trunk support of up to min A with L posterior lean. Limited righting attempts made. Pt requiring increased A for return to supine, transfer in prep for self care activities and transfers seated EOB. Anticipate pt to make excellent gains with participation with skilled OT services at time of d.c    Follow Up Recommendations  CIR    Equipment Recommendations  Other (comment)    Recommendations for Other Services Rehab consult    Precautions / Restrictions Precautions Precautions: Fall Precaution Comments: falls asleep easily Restrictions Weight Bearing Restrictions: No       Mobility Bed Mobility Overal bed mobility: Needs  Assistance Bed Mobility: Supine to Sit Rolling: Mod assist   Supine to sit: Mod assist;HOB elevated   Sit to sidelying: Max assist;HOB elevated      Transfers       Sit to Stand: Mod assist;From elevated surface         General transfer comment: sustained static standing with slight knee block, and cues for wieght shifting and attention to sequencing.    Balance                                           ADL either performed or assessed with clinical judgement   ADL                                               Vision       Perception     Praxis      Cognition Arousal/Alertness: Lethargic                                              Exercises     Shoulder Instructions       General Comments      Pertinent Vitals/ Pain       Pain Assessment: No/denies pain  Home Living  Prior Functioning/Environment              Frequency  Min 2X/week        Progress Toward Goals  OT Goals(current goals can now be found in the care plan section)  Progress towards OT goals: Progressing toward goals     Plan Discharge plan remains appropriate    Co-evaluation                 AM-PAC OT "6 Clicks" Daily Activity     Outcome Measure   Help from another person eating meals?: A Lot Help from another person taking care of personal grooming?: A Lot Help from another person toileting, which includes using toliet, bedpan, or urinal?: Total Help from another person bathing (including washing, rinsing, drying)?: A Lot Help from another person to put on and taking off regular upper body clothing?: A Lot Help from another person to put on and taking off regular lower body clothing?: Total 6 Click Score: 10    End of Session Equipment Utilized During Treatment: Gait belt  OT Visit Diagnosis: Other abnormalities of gait and mobility  (R26.89);Muscle weakness (generalized) (M62.81);Other symptoms and signs involving cognitive function;Other symptoms and signs involving the nervous system (R29.898);Hemiplegia and hemiparesis Hemiplegia - Right/Left: Left Hemiplegia - dominant/non-dominant: Non-Dominant Hemiplegia - caused by: Cerebral infarction   Activity Tolerance Patient limited by fatigue;Other (comment)   Patient Left in bed;with call bell/phone within reach;with bed alarm set;with nursing/sitter in room   Nurse Communication Mobility status        Time: 1444-5848 OT Time Calculation (min): 18 min  Charges: OT General Charges $OT Visit: 1 Visit OT Treatments $Self Care/Home Management : 8-22 mins  Barby Colvard OTR/L acute rehab services Office: [phone]  09/03/2020, 2:10 PM

## 2020-09-03 NOTE — Discharge Summary (Signed)
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   --  220 262 255   BMP &GFR Recent Labs  Lab 08/31/20 2100 08/31/20 2148 09/01/20 0500 09/02/20 0244 09/03/20 0157  NA 140 140 138 135 136  K 3.7 4.3 4.0 3.8 3.6  CL 107 108 108 102 105  CO2 25  --  '22 25 24  ' GLUCOSE 90 90 107* 104* 90  BUN '16 17 13 13 11  ' CREATININE 0.86 0.80 0.59 0.83 0.64  CALCIUM 9.4  --  9.1 9.4 9.5  MG  --   --  2.0 2.1 2.1  PHOS  --   --  3.8  --  4.1   Estimated Creatinine Clearance: 84.4 mL/min (by C-G formula based on SCr of 0.64 mg/dL). Liver & Pancreas: Recent Labs  Lab 08/31/20 2100 09/01/20 0500 09/03/20 0157  AST 15 14*  --   ALT 14 13  --   ALKPHOS 77 66  --   BILITOT 0.4 0.5  --   PROT 7.7 7.1  --   ALBUMIN 3.9 3.8 3.4*   No results for input(s): LIPASE, AMYLASE in the last 168 hours. No results for input(s): AMMONIA in the last 168 hours. Diabetic: Recent Labs    09/01/20 0500  HGBA1C 5.3   Recent Labs  Lab 08/31/20 2105  GLUCAP 90   Cardiac Enzymes: No results for input(s): CKTOTAL, CKMB, CKMBINDEX, TROPONINI in the last 168 hours. No results for input(s): PROBNP in the last 8760 hours. Coagulation Profile: Recent Labs  Lab 08/31/20 2100 09/01/20 0500  INR 1.0 1.0   Thyroid Function Tests: Recent Labs    09/03/20 0157  TSH 1.198   Lipid Profile: Recent Labs    09/01/20 0500  CHOL 198  HDL 51  LDLCALC 131*  TRIG 82  CHOLHDL 3.9   Anemia Panel: Recent Labs    09/01/20 0807 09/03/20 0157  VITAMINB12  --  329  FERRITIN 91  --   TIBC 221*  --   IRON 35  --    Urine analysis:    Component Value Date/Time   COLORURINE YELLOW 08/31/2020 2133   APPEARANCEUR CLEAR 08/31/2020 2133   LABSPEC 1.015 08/31/2020 2133   PHURINE 6.0 08/31/2020 2133   GLUCOSEU NEGATIVE 08/31/2020 2133   HGBUR NEGATIVE 08/31/2020 2133   BILIRUBINUR NEGATIVE 08/31/2020 2133   KETONESUR NEGATIVE 08/31/2020 2133   PROTEINUR NEGATIVE 08/31/2020 2133   NITRITE NEGATIVE 08/31/2020 2133   LEUKOCYTESUR NEGATIVE 08/31/2020 2133    Sepsis Labs: Invalid input(s): PROCALCITONIN, LACTICIDVEN   Time coordinating discharge: 35 minutes  SIGNED:  Mercy Riding, MD  [facility] 09/03/2020, 11:46 AM  If 7PM-7AM, please contact night-coverage [email or web address]

## 2020-09-03 NOTE — H&P (Signed)
Physical Medicine and Rehabilitation Admission H&P        Chief Complaint  Patient presents with   Altered Mental Status   Numbness  : HPI: Betty Archer. Nilsen is a 60 year old right-handed female with history of hypertension, hyperlipidemia, tobacco use as well as history of a 6 mm right A2/ACA aneurysm and an 8 mm left MCA bifurcation aneurysm identified after patient with complaints of headache and right arm numbness in June 2022 followed by neurology services and underwent diagnostic cerebral angiogram and right aneurysmal embolization on 08/25/2020 per interventional radiology and left MCA aneurysm unrepaired with course complicated by right groin hematoma that required prolonged initial intubation was discharged home 08/27/2020 maintained on aspirin and Brilinta..  Per chart review patient lives with her son.  1 level apartment with multiple steps to entry.  Patient had required assistance for mobility since recent discharge she had been taking sponge baths.  Presented 08/31/2020 with altered mental status and lower extremity weakness.  Cranial CT scan negative for acute changes.  CT angiogram of head and neck no emergent large vessel occlusion or high-grade stenosis.  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  Coil mass at the anterior communicating artery without visible residual aneurysmal filling.  MRI of the brain showed confluent right acute ACA territory infarction including involvement of the anterior basal ganglia and ischemia also in the left ACA territory with no associated hemorrhage or mass-effect.  Echocardiogram with ejection fraction of 65 to 85% grade 2 diastolic dysfunction no regional wall motion abnormalities.  Admission chemistries were unremarkable, hemoglobin 9.3, urine drug screen negative.  Neurology follow-up currently maintained on aspirin as well as Brilinta for CVA prophylaxis.  Tolerating a regular consistency diet.  Therapy evaluations completed due to patient's  decreased functional ability altered mental status was admitted for a comprehensive rehab program.   Review of Systems Constitutional:  Negative for chills and fever. HENT:  Negative for hearing loss.   Eyes:  Negative for blurred vision, double vision and discharge. Respiratory:  Negative for cough and shortness of breath.   Cardiovascular:  Positive for leg swelling. Negative for chest pain and palpitations. Gastrointestinal:  Positive for constipation. Negative for heartburn, nausea and vomiting. Genitourinary:  Negative for dysuria, flank pain and hematuria. Musculoskeletal:  Positive for myalgias. Skin:  Negative for rash. Neurological:  Positive for headaches.       Lower extremity weakness left greater than right  All other systems reviewed and are negative.     Past Medical History:  Diagnosis Date   Hyperlipidemia           Past Surgical History:  Procedure Laterality Date   BREAST BIOPSY         Core biopsy done on April 2003   COLONOSCOPY       IR 3D INDEPENDENT WKST   08/25/2020   IR 3D INDEPENDENT WKST   08/25/2020   IR ANGIO INTRA EXTRACRAN SEL INTERNAL CAROTID BILAT MOD SED   08/25/2020   IR ANGIO VERTEBRAL SEL VERTEBRAL BILAT MOD SED   08/25/2020   IR CT HEAD LTD   08/25/2020   IR INTRA CRAN STENT   08/25/2020   IR TRANSCATH/EMBOLIZ   08/25/2020   IR US GUIDE VASC ACCESS RIGHT   08/25/2020   PARTIAL HYSTERECTOMY    10/22/1999     Still has cervix   RADIOLOGY WITH ANESTHESIA N/A 08/25/2020    Procedure: IR WITH ANESTHESIA EMBOLIZATION;  Surgeon: Pedro Earls,  MD;  Location: [facility];  Service: Radiology;  Laterality: N/A;   TRIGGER FINGER RELEASE Left 12/25/2013    Procedure: LEFT THUMB TRIGGER RELEASE ;  Surgeon: Leanora Cover, MD;  Location: [facility];  Service: Orthopedics;  Laterality: Left;         Family History  Problem Relation Age of Onset   Breast cancer Maternal Grandmother     Breast cancer Maternal Aunt     Hypertension Mother     Lung  cancer Father     Colon cancer Neg Hx     Esophageal cancer Neg Hx     Stomach cancer Neg Hx      Social History:  reports that she has been smoking cigarettes. She has a 13.50 pack-year smoking history. She has never used smokeless tobacco. She reports current alcohol use. She reports that she does not use drugs. Allergies: No Known Allergies       Medications Prior to Admission  Medication Sig Dispense Refill   aspirin 81 MG chewable tablet Chew 81 mg by mouth in the morning and at bedtime.       docusate sodium (COLACE) 100 MG capsule Take 1 capsule (100 mg total) by mouth 2 (two) times daily. (Patient taking differently: Take 100 mg by mouth 2 (two) times daily as needed for mild constipation.) 10 capsule 0   ferrous sulfate 325 (65 FE) MG tablet Take 1 tablet (325 mg total) by mouth daily with breakfast. (Patient taking differently: Take 325 mg by mouth daily.) 30 tablet 3   ticagrelor (BRILINTA) 90 MG TABS tablet Take 90 mg by mouth 2 (two) times daily.          Drug Regimen Review Drug regimen was reviewed and remains appropriate with no significant issues identified   Home: Home Living Family/patient expects to be discharged to:: Private residence Living Arrangements: Children Available Help at Discharge: Family, Available PRN/intermittently Type of Home: Apartment Home Access: Stairs to enter Technical brewer of Steps: 15+ Entrance Stairs-Rails: Right Home Layout: One level Bathroom Shower/Tub: Public librarian, Multimedia programmer: Standard Home Equipment: None Additional Comments: son has been living with patient for past year   Functional History: Prior Function Level of Independence: Needs assistance Gait / Transfers Assistance Needed: per son, since DC from [facility] [place] on walls and furniture, required assistance up steps, taking sponge baths ADL's / Homemaking Assistance Needed: since [facility] patient sponge bathing and otherwise mostly  independent with self care tasks   Functional Status:  Mobility: Bed Mobility Overal bed mobility: Needs Assistance Bed Mobility: Supine to Sit Rolling: Total assist, +2 for physical assistance, +2 for safety/equipment Sidelying to sit: +2 for physical assistance, +2 for safety/equipment, Total assist Supine to sit: Mod assist, +2 for physical assistance, HOB elevated Sit to sidelying: Total assist, +2 for physical assistance, +2 for safety/equipment General bed mobility comments: pt able to bring R LE to EOB, maxA for L LE management, with max verbal cues pt attempted to use L UE to assist but ultimately required modAx2 for trunk elevation and pivoting hips to EOB Transfers Overall transfer level: Needs assistance Equipment used: 2 person hand held assist (face to face transfer) Transfers: Sit to/from Stand, Stand Pivot Transfers Sit to Stand: Mod assist, +2 physical assistance Stand pivot transfers: Mod assist, +2 physical assistance General transfer comment: pt with good power up, modA to maintain balance as pt with posterior bias, max directional verbal cues to sequence stepping to chair, L  knee blocked due to instability when advancing R LE but able to stand without buckling   ADL: ADL Overall ADL's : Needs assistance/impaired Grooming: Moderate assistance, Bed level Upper Body Bathing: Maximal assistance, Bed level Lower Body Bathing: Total assistance, Bed level Upper Body Dressing : Maximal assistance, Bed level Lower Body Dressing: Total assistance, Bed level Toilet Transfer Details (indicate cue type and reason): deferred due to zero sitting balance needing mod to max A to maintain upright position at edge of bed Toileting- Clothing Manipulation and Hygiene: Total assistance, Bed level General ADL Comments: patient requiring significant assistance with all ADL tasks due to decreased strength, ROM, coordination, cognition, balance   Cognition: Cognition Overall Cognitive  Status: Impaired/Different from baseline Orientation Level: (P) Oriented to person, Oriented to place, Oriented to situation Cognition Arousal/Alertness: Awake/alert (but falls asleep quickly and freq, when awoke pt oriented and able to answer questions appropriately) Behavior During Therapy: Flat affect (easily falls asleep) Overall Cognitive Status: Impaired/Different from baseline Area of Impairment: Attention, Problem solving, Awareness, Safety/judgement, Following commands Orientation Level: Disoriented to, Time, Situation Current Attention Level: Focused Following Commands: Follows one step commands with increased time Safety/Judgement: Decreased awareness of deficits Awareness: Intellectual Problem Solving: Decreased initiation, Requires verbal cues, Requires tactile cues General Comments: pt would fall asleep freq t/o session however was appropriate when awake, pt aware she was falling backwards but didn't attempt to self correct requiring max verbal cues   Physical Exam: Blood pressure (!) 103/91, pulse 61, temperature 98.4 F (36.9 C), temperature source Oral, resp. rate 18, height 5\' 6"  (1.676 m), weight 89.8 kg, SpO2 100 %. Physical Exam Constitutional:      General: She is not in acute distress. HENT:    Head: Normocephalic and atraumatic.    Nose: Nose normal.    Mouth/Throat:    Mouth: Mucous membranes are moist. Eyes:    General: No scleral icterus.    Conjunctiva/sclera: Conjunctivae normal. Cardiovascular:    Rate and Rhythm: Normal rate and regular rhythm.    Heart sounds: No murmur heard.   No gallop. Pulmonary:    Effort: Pulmonary effort is normal. No respiratory distress.    Breath sounds: No wheezing. Abdominal:    General: Bowel sounds are normal. There is no distension.    Palpations: Abdomen is soft.    Tenderness: There is no abdominal tenderness. Musculoskeletal:        General: No swelling or tenderness. Normal range of motion.    Cervical back:  Neck supple. Skin:    General: Skin is warm.    Coloration: Skin is not jaundiced or pale. Neurological:    Comments: Patient is slow to arouse  No acute distress.  Makes eye contact with examiner.  Provides name and age.  Follows commands.  Fair awareness of deficits. Tracks to left and right. Moves RUE and RLE spontaneously. 3-4/5 LUE and 2-3/5LLE without consistent activation. Sensed pain on left side. No abnormal tone.   Psychiatric:    Comments: Flat, slowed      Lab Results Last 48 Hours        Results for orders placed or performed during the hospital encounter of 08/31/20 (from the past 48 hour(s))  Ferritin     Status: None    Collection Time: 09/01/20  8:07 AM  Result Value Ref Range    Ferritin 91 11 - 307 ng/mL      Comment: Performed at [facility], [place] [address], [place], [place] [address]  Iron  and TIBC     Status: Abnormal    Collection Time: 09/01/20  8:07 AM  Result Value Ref Range    Iron 35 28 - 170 ug/dL    TIBC 221 (L) 250 - 450 ug/dL    Saturation Ratios 16 10.4 - 31.8 %    UIBC 186 ug/dL      Comment: Performed at [facility], [place] [address] [place], [place] [address]  SARS CORONAVIRUS 2 (TAT 6-24 HRS) Nasopharyngeal Nasopharyngeal Swab     Status: None    Collection Time: 09/01/20 12:57 PM    Specimen: Nasopharyngeal Swab  Result Value Ref Range    SARS Coronavirus 2 NEGATIVE NEGATIVE      Comment: (NOTE) SARS-CoV-2 target nucleic acids are NOT DETECTED.   The SARS-CoV-2 RNA is generally detectable in upper and lower respiratory specimens during the acute phase of infection. Negative results do not preclude SARS-CoV-2 infection, do not rule out co-infections with other pathogens, and should not be used as the sole basis for treatment or other patient management decisions. Negative results must be combined with clinical observations, patient history, and epidemiological information. The expected result is  Negative.   Fact Sheet for Patients: SugarRoll.be   Fact Sheet for Healthcare Providers: https://www.woods-mathews.com/   This test is not yet approved or cleared by the [place] FDA and has been authorized for detection and/or diagnosis of SARS-CoV-2 by FDA under an Emergency Use Authorization (EUA). This EUA will remain in effect (meaning this test can be used) for the duration of the COVID-19 declaration under Se ction 564(b)(1) of the Act, 21 U.S.C. section 360bbb-3(b)(1), unless the authorization is terminated or revoked sooner.   Performed at [facility], [place] [address], [place], [place] [address]    CBC with Differential/Platelet     Status: Abnormal    Collection Time: 09/02/20  2:44 AM  Result Value Ref Range    WBC 12.2 (H) 4.0 - 10.5 K/uL    RBC 3.62 (L) 3.87 - 5.11 MIL/uL    Hemoglobin 8.7 (L) 12.0 - 15.0 g/dL    HCT 28.3 (L) 36.0 - 46.0 %    MCV 78.2 (L) 80.0 - 100.0 fL    MCH 24.0 (L) 26.0 - 34.0 pg    MCHC 30.7 30.0 - 36.0 g/dL    RDW 16.3 (H) 11.5 - 15.5 %    Platelets 262 150 - 400 K/uL    nRBC 0.2 0.0 - 0.2 %    Neutrophils Relative % 77 %    Neutro Abs 9.3 (H) 1.7 - 7.7 K/uL    Lymphocytes Relative 16 %    Lymphs Abs 2.0 0.7 - 4.0 K/uL    Monocytes Relative 6 %    Monocytes Absolute 0.8 0.1 - 1.0 K/uL    Eosinophils Relative 0 %    Eosinophils Absolute 0.0 0.0 - 0.5 K/uL    Basophils Relative 0 %    Basophils Absolute 0.0 0.0 - 0.1 K/uL    Immature Granulocytes 1 %    Abs Immature Granulocytes 0.10 (H) 0.00 - 0.07 K/uL      Comment: Performed at [facility] [address], [place], [place] [address]  Basic metabolic panel     Status: Abnormal    Collection Time: 09/02/20  2:44 AM  Result Value Ref Range    Sodium 135 135 - 145 mmol/L    Potassium 3.8 3.5 - 5.1 mmol/L    Chloride 102 98 -  111 mmol/L    CO2 25 22 - 32 mmol/L    Glucose, Bld 104 (H) 70 - 99 mg/dL      Comment: Glucose  reference range applies only to samples taken after fasting for at least 8 hours.    BUN 13 6 - 20 mg/dL    Creatinine, Ser 0.83 0.44 - 1.00 mg/dL    Calcium 9.4 8.9 - 10.3 mg/dL    GFR, Estimated >60 >60 mL/min      Comment: (NOTE) Calculated using the CKD-EPI Creatinine Equation (2021)      Anion gap 8 5 - 15      Comment: Performed at [facility] [address] [place], [place] [address]  Magnesium     Status: None    Collection Time: 09/02/20  2:44 AM  Result Value Ref Range    Magnesium 2.1 1.7 - 2.4 mg/dL      Comment: Performed at [facility] [address], [place], [place] [address]  Renal function panel     Status: Abnormal    Collection Time: 09/03/20  1:57 AM  Result Value Ref Range    Sodium 136 135 - 145 mmol/L    Potassium 3.6 3.5 - 5.1 mmol/L    Chloride 105 98 - 111 mmol/L    CO2 24 22 - 32 mmol/L    Glucose, Bld 90 70 - 99 mg/dL      Comment: Glucose reference range applies only to samples taken after fasting for at least 8 hours.    BUN 11 6 - 20 mg/dL    Creatinine, Ser 0.64 0.44 - 1.00 mg/dL    Calcium 9.5 8.9 - 10.3 mg/dL    Phosphorus 4.1 2.5 - 4.6 mg/dL    Albumin 3.4 (L) 3.5 - 5.0 g/dL    GFR, Estimated >60 >60 mL/min      Comment: (NOTE) Calculated using the CKD-EPI Creatinine Equation (2021)      Anion gap 7 5 - 15      Comment: Performed at [facility] [address], [place], [place] [address]  Magnesium     Status: None    Collection Time: 09/03/20  1:57 AM  Result Value Ref Range    Magnesium 2.1 1.7 - 2.4 mg/dL      Comment: Performed at [facility] [address] [place], [place] [address]  CBC     Status: Abnormal    Collection Time: 09/03/20  1:57 AM  Result Value Ref Range    WBC 9.9 4.0 - 10.5 K/uL    RBC 3.61 (L) 3.87 - 5.11 MIL/uL    Hemoglobin 8.7 (L) 12.0 - 15.0 g/dL    HCT 28.2 (L) 36.0 - 46.0 %    MCV 78.1 (L) 80.0 - 100.0 fL    MCH 24.1 (L) 26.0 - 34.0 pg    MCHC 30.9 30.0 - 36.0 g/dL     RDW 16.3 (H) 11.5 - 15.5 %    Platelets 255 150 - 400 K/uL    nRBC 0.0 0.0 - 0.2 %      Comment: Performed at [facility] [address] [place], [place] [address]  TSH     Status: None    Collection Time: 09/03/20  1:57 AM  Result Value Ref Range    TSH 1.198 0.350 - 4.500 uIU/mL      Comment: Performed by a 3rd Generation assay with a functional sensitivity of <=  0.01 uIU/mL. Performed at [facility], [place] [address] [place], [place] [address]    Vitamin B12     Status: None    Collection Time: 09/03/20  1:57 AM  Result Value Ref Range    Vitamin B-12 329 180 - 914 pg/mL      Comment: (NOTE) This assay is not validated for testing neonatal or myeloproliferative syndrome specimens for Vitamin B12 levels. Performed at [facility], [place] [address], [place], [place] [address]         Imaging Results (Last 48 hours)  MR BRAIN WO CONTRAST   Result Date: 09/01/2020 CLINICAL DATA:  60 year old female with lower extremity tingling. Encephalopathy. Status post coil embolization of anterior communicating artery aneurysm 1 week ago. 8 mm left MCA bifurcation aneurysm. EXAM: MRI HEAD WITHOUT CONTRAST TECHNIQUE: Multiplanar, multiecho pulse sequences of the brain and surrounding structures were obtained without intravenous contrast. COMPARISON:  CTA head and neck yesterday and earlier. Brain MRI 08/13/2013. FINDINGS: Brain: Confluent restricted diffusion in the right ACA territory (series 5, image 65), but also affecting some cortex of the cingulate gyrus on the left (series 12, image 48). And there is also dense restricted diffusion in the anterior right basal ganglia related to penetrating arteries of the proximal ACA. Posteriorly on the right a small area of medial perirolandic cortex is also affected (series 5, image 89). Questionable also punctate abnormal diffusion in the right superior cerebellum on series 5, image 68, but not correlated on the coronal images. T2 and FLAIR  hyperintensity in keeping with cytotoxic edema. No parenchymal hemorrhage or significant mass effect. No midline shift or ventriculomegaly. Small widely scattered cerebral white matter T2 and FLAIR hyperintense foci are in a similar pattern to the 2015 MRI, mildly progressed since that time. No chronic cortical encephalomalacia or definite chronic cerebral blood products. No midline shift, evidence of mass lesion, extra-axial collection or acute intracranial hemorrhage. Basilar cisterns are patent. Cervicomedullary junction and pituitary are within normal limits. Vascular: Major intracranial vascular flow voids are stable since 2015. T2* susceptibility artifact related to the anterior communicating artery aneurysm stent assisted coiling. Skull and upper cervical spine: Negative. Visualized bone marrow signal is within normal limits. Sinuses/Orbits: Negative. Paranasal Visualized paranasal sinuses and mastoids are stable and well aerated. Other: Negative visible scalp and face soft tissues. IMPRESSION: 1. Confluent Acute Right ACA territory infarct. This includes involvement of the anterior basal ganglia (medial striate artery territory). Ischemia also in the Left ACA territory but limited to cortex of the cingulate gyrus. 2. Cytotoxic edema but no associated hemorrhage or significant mass effect. Electronically Signed   By: Genevie Ann M.D.   On: 09/01/2020 07:53   ECHOCARDIOGRAM COMPLETE   Result Date: 09/02/2020    ECHOCARDIOGRAM REPORT   Patient Name:   Betty Archer Date of Exam: 09/02/2020 Medical Rec #:  [id number]      Height:       66.0 in Accession #:    [id number]     Weight:       198.0 lb Date of Birth:  1960/10/04      BSA:          1.991 m Patient Age:    59 years       BP:           130/66 mmHg Patient Gender: F              HR:  64 bpm. Exam Location:  Inpatient Procedure: 2D Echo, Color Doppler and Cardiac Doppler Indications:    Stroke I63.9  History:        Patient has prior history of  Echocardiogram examinations, most                 recent 08/13/2013. Risk Factors:Dyslipidemia and Diabetes.  Sonographer:    Bernadene Person RDCS Referring Phys: [id number] OLADAPO ADEFESO IMPRESSIONS  1. Left ventricular ejection fraction, by estimation, is 65 to 70%. The left ventricle has normal function. The left ventricle has no regional wall motion abnormalities. Left ventricular diastolic parameters are consistent with Grade II diastolic dysfunction (pseudonormalization).  2. Right ventricular systolic function is normal. The right ventricular size is normal. Tricuspid regurgitation signal is inadequate for assessing PA pressure.  3. The mitral valve is normal in structure. No evidence of mitral valve regurgitation. No evidence of mitral stenosis.  4. The aortic valve is tricuspid. Aortic valve regurgitation is not visualized. No aortic stenosis is present.  5. The inferior vena cava is normal in size with greater than 50% respiratory variability, suggesting right atrial pressure of 3 mmHg. FINDINGS  Left Ventricle: Left ventricular ejection fraction, by estimation, is 65 to 70%. The left ventricle has normal function. The left ventricle has no regional wall motion abnormalities. The left ventricular internal cavity size was normal in size. There is  no left ventricular hypertrophy. Left ventricular diastolic parameters are consistent with Grade II diastolic dysfunction (pseudonormalization). Right Ventricle: The right ventricular size is normal. No increase in right ventricular wall thickness. Right ventricular systolic function is normal. Tricuspid regurgitation signal is inadequate for assessing PA pressure. Left Atrium: Left atrial size was normal in size. Right Atrium: Right atrial size was normal in size. Pericardium: There is no evidence of pericardial effusion. Mitral Valve: The mitral valve is normal in structure. No evidence of mitral valve regurgitation. No evidence of mitral valve stenosis. Tricuspid  Valve: The tricuspid valve is normal in structure. Tricuspid valve regurgitation is not demonstrated. Aortic Valve: The aortic valve is tricuspid. Aortic valve regurgitation is not visualized. No aortic stenosis is present. Pulmonic Valve: The pulmonic valve was normal in structure. Pulmonic valve regurgitation is not visualized. Aorta: The aortic root is normal in size and structure. Venous: The inferior vena cava is normal in size with greater than 50% respiratory variability, suggesting right atrial pressure of 3 mmHg. IAS/Shunts: No atrial level shunt detected by color flow Doppler.  LEFT VENTRICLE PLAX 2D LVIDd:         3.60 cm  Diastology LVIDs:         2.00 cm  LV e' medial:    6.24 cm/s LV PW:         1.00 cm  LV E/e' medial:  14.6 LV IVS:        1.00 cm  LV e' lateral:   7.50 cm/s LVOT diam:     2.00 cm  LV E/e' lateral: 12.1 LV SV:         84 LV SV Index:   42 LVOT Area:     3.14 cm  RIGHT VENTRICLE RV S prime:     11.60 cm/s TAPSE (M-mode): 2.1 cm LEFT ATRIUM             Index       RIGHT ATRIUM           Index LA diam:        2.20 cm 1.10 cm/m  RA Area:     16.40 cm LA Vol (A2C):   30.4 ml 15.27 ml/m RA Volume:   42.60 ml  21.39 ml/m LA Vol (A4C):   27.5 ml 13.81 ml/m LA Biplane Vol: 31.0 ml 15.57 ml/m  AORTIC VALVE LVOT Vmax:   137.00 cm/s LVOT Vmean:  90.600 cm/s LVOT VTI:    0.267 m  AORTA Ao Root diam: 3.10 cm Ao Asc diam:  3.20 cm MITRAL VALVE MV Area (PHT): 3.85 cm    SHUNTS MV Decel Time: 197 msec    Systemic VTI:  0.27 m MV E velocity: 90.80 cm/s  Systemic Diam: 2.00 cm MV A velocity: 51.00 cm/s MV E/A ratio:  1.78 Loralie Champagne MD Electronically signed by Loralie Champagne MD Signature Date/Time: 09/02/2020/2:38:04 PM    Final              Medical Problem List and Plan: 1.  Altered mental status with decreased functional mobility/left lower extremity weakness secondary to large right ACA and small left ACA infarct likely related to recent ACOM aneurysm post coiling and stenting              -patient may10-1 shower             -ELOS/Goals: 10-14 days, supervision goals with PT, OT, SLP 2.  Antithrombotics: -DVT/anticoagulation: SCDs             -antiplatelet therapy: Brilinta 90 mg twice daily, aspirin 81 mg daily 3. Pain Management: Lidoderm patch as directed, Tylenol as needed 4. Mood: Team to provide emotional support as necessary             -antipsychotic agents: N/A 5. Neuropsych: This patient is capable of making decisions on her own behalf. 6. Skin/Wound Care: Routine skin checks 7. Fluids/Electrolytes/Nutrition: Routine in and outs with follow-up chemistries             -encourage appropriate PO 8.  Hyperlipidemia.  Lipitor 9.  Hypertension.                -BP's for the most part controlled off meds currently.   -will monitor as she progresses with rehab 10.  History of tobacco abuse.  Counseling       Cathlyn Parsons, PA-C 09/03/2020

## 2020-09-03 NOTE — Progress Notes (Signed)
Patient ID: Betty Archer, female   DOB: [birth year], 60 y.o.   MRN: [id number] Patient escorted to floor via bed with discharging nurse. During assessment patient was in and out of sleeping but able to be aroused and answer questions. Reinforcement needed for rules and regulations of the unit. Sanda Linger, LPN

## 2020-09-03 NOTE — Progress Notes (Addendum)
Patient transferred with glasses, cell phone and charger and white ball cap

## 2020-09-03 NOTE — H&P (Signed)
Physical Medicine and Rehabilitation Admission H&P    Chief Complaint  Patient presents with   Altered Mental Status   Numbness  : HPI: Betty Archer. Roma is a 60 year old right-handed female with history of hypertension, hyperlipidemia, tobacco use as well as history of a 6 mm right A2/ACA aneurysm and an 8 mm left MCA bifurcation aneurysm identified after patient with complaints of headache and right arm numbness in June 2022 followed by neurology services and underwent diagnostic cerebral angiogram and right aneurysmal embolization on 08/25/2020 per interventional radiology and left MCA aneurysm unrepaired with course complicated by right groin hematoma that required prolonged initial intubation was discharged home 08/27/2020 maintained on aspirin and Brilinta..  Per chart review patient lives with her son.  1 level apartment with multiple steps to entry.  Patient had required assistance for mobility since recent discharge she had been taking sponge baths.  Presented 08/31/2020 with altered mental status and lower extremity weakness.  Cranial CT scan negative for acute changes.  CT angiogram of head and neck no emergent large vessel occlusion or high-grade stenosis.  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  Coil mass at the anterior communicating artery without visible residual aneurysmal filling.  MRI of the brain showed confluent right acute ACA territory infarction including involvement of the anterior basal ganglia and ischemia also in the left ACA territory with no associated hemorrhage or mass-effect.  Echocardiogram with ejection fraction of 65 to 80% grade 2 diastolic dysfunction no regional wall motion abnormalities.  Admission chemistries were unremarkable, hemoglobin 9.3, urine drug screen negative.  Neurology follow-up currently maintained on aspirin as well as Brilinta for CVA prophylaxis.  Tolerating a regular consistency diet.  Therapy evaluations completed due to patient's decreased  functional ability altered mental status was admitted for a comprehensive rehab program.  Review of Systems  Constitutional:  Negative for chills and fever.  HENT:  Negative for hearing loss.   Eyes:  Negative for blurred vision, double vision and discharge.  Respiratory:  Negative for cough and shortness of breath.   Cardiovascular:  Positive for leg swelling. Negative for chest pain and palpitations.  Gastrointestinal:  Positive for constipation. Negative for heartburn, nausea and vomiting.  Genitourinary:  Negative for dysuria, flank pain and hematuria.  Musculoskeletal:  Positive for myalgias.  Skin:  Negative for rash.  Neurological:  Positive for headaches.       Lower extremity weakness left greater than right  All other systems reviewed and are negative. Past Medical History:  Diagnosis Date   Hyperlipidemia    Past Surgical History:  Procedure Laterality Date   BREAST BIOPSY      Core biopsy done on April 2003   COLONOSCOPY     IR 3D INDEPENDENT [facility]  08/25/2020   IR 3D INDEPENDENT [facility]  08/25/2020   IR ANGIO INTRA EXTRACRAN SEL INTERNAL CAROTID BILAT MOD SED  08/25/2020   IR ANGIO VERTEBRAL SEL VERTEBRAL BILAT MOD SED  08/25/2020   IR CT HEAD LTD  08/25/2020   IR INTRA CRAN STENT  08/25/2020   IR TRANSCATH/EMBOLIZ  08/25/2020   IR US GUIDE VASC ACCESS RIGHT  08/25/2020   PARTIAL HYSTERECTOMY   10/22/1999    Still has cervix   RADIOLOGY WITH ANESTHESIA N/A 08/25/2020   Procedure: IR WITH ANESTHESIA EMBOLIZATION;  Surgeon: Pedro Earls, MD;  Location: [facility];  Service: Radiology;  Laterality: N/A;   TRIGGER FINGER RELEASE Left 12/25/2013   Procedure: LEFT THUMB TRIGGER RELEASE ;  Surgeon: Leanora Cover, MD;  Location: [facility];  Service: Orthopedics;  Laterality: Left;   Family History  Problem Relation Age of Onset   Breast cancer Maternal Grandmother    Breast cancer Maternal Aunt    Hypertension Mother    Lung cancer Father    Colon cancer Neg Hx     Esophageal cancer Neg Hx    Stomach cancer Neg Hx    Social History:  reports that she has been smoking cigarettes. She has a 13.50 pack-year smoking history. She has never used smokeless tobacco. She reports current alcohol use. She reports that she does not use drugs. Allergies: No Known Allergies Medications Prior to Admission  Medication Sig Dispense Refill   aspirin 81 MG chewable tablet Chew 81 mg by mouth in the morning and at bedtime.     docusate sodium (COLACE) 100 MG capsule Take 1 capsule (100 mg total) by mouth 2 (two) times daily. (Patient taking differently: Take 100 mg by mouth 2 (two) times daily as needed for mild constipation.) 10 capsule 0   ferrous sulfate 325 (65 FE) MG tablet Take 1 tablet (325 mg total) by mouth daily with breakfast. (Patient taking differently: Take 325 mg by mouth daily.) 30 tablet 3   ticagrelor (BRILINTA) 90 MG TABS tablet Take 90 mg by mouth 2 (two) times daily.      Drug Regimen Review Drug regimen was reviewed and remains appropriate with no significant issues identified  Home: Home Living Family/patient expects to be discharged to:: Private residence Living Arrangements: Children Available Help at Discharge: Family, Available PRN/intermittently Type of Home: Apartment Home Access: Stairs to enter Technical brewer of Steps: 15+ Entrance Stairs-Rails: Right Home Layout: One level Bathroom Shower/Tub: Public librarian, Multimedia programmer: Standard Home Equipment: None Additional Comments: son has been living with patient for past year   Functional History: Prior Function Level of Independence: Needs assistance Gait / Transfers Assistance Needed: per son, since DC from [facility] [place] on walls and furniture, required assistance up steps, taking sponge baths ADL's / Homemaking Assistance Needed: since [facility] patient sponge bathing and otherwise mostly independent with self care tasks  Functional Status:   Mobility: Bed Mobility Overal bed mobility: Needs Assistance Bed Mobility: Supine to Sit Rolling: Total assist, +2 for physical assistance, +2 for safety/equipment Sidelying to sit: +2 for physical assistance, +2 for safety/equipment, Total assist Supine to sit: Mod assist, +2 for physical assistance, HOB elevated Sit to sidelying: Total assist, +2 for physical assistance, +2 for safety/equipment General bed mobility comments: pt able to bring R LE to EOB, maxA for L LE management, with max verbal cues pt attempted to use L UE to assist but ultimately required modAx2 for trunk elevation and pivoting hips to EOB Transfers Overall transfer level: Needs assistance Equipment used: 2 person hand held assist (face to face transfer) Transfers: Sit to/from Stand, Stand Pivot Transfers Sit to Stand: Mod assist, +2 physical assistance Stand pivot transfers: Mod assist, +2 physical assistance General transfer comment: pt with good power up, modA to maintain balance as pt with posterior bias, max directional verbal cues to sequence stepping to chair, L knee blocked due to instability when advancing R LE but able to stand without buckling      ADL: ADL Overall ADL's : Needs assistance/impaired Grooming: Moderate assistance, Bed level Upper Body Bathing: Maximal assistance, Bed level Lower Body Bathing: Total assistance, Bed level Upper Body Dressing : Maximal assistance, Bed level Lower Body  Dressing: Total assistance, Bed level Toilet Transfer Details (indicate cue type and reason): deferred due to zero sitting balance needing mod to max A to maintain upright position at edge of bed Toileting- Clothing Manipulation and Hygiene: Total assistance, Bed level General ADL Comments: patient requiring significant assistance with all ADL tasks due to decreased strength, ROM, coordination, cognition, balance  Cognition: Cognition Overall Cognitive Status: Impaired/Different from baseline Orientation  Level: (P) Oriented to person, Oriented to place, Oriented to situation Cognition Arousal/Alertness: Awake/alert (but falls asleep quickly and freq, when awoke pt oriented and able to answer questions appropriately) Behavior During Therapy: Flat affect (easily falls asleep) Overall Cognitive Status: Impaired/Different from baseline Area of Impairment: Attention, Problem solving, Awareness, Safety/judgement, Following commands Orientation Level: Disoriented to, Time, Situation Current Attention Level: Focused Following Commands: Follows one step commands with increased time Safety/Judgement: Decreased awareness of deficits Awareness: Intellectual Problem Solving: Decreased initiation, Requires verbal cues, Requires tactile cues General Comments: pt would fall asleep freq t/o session however was appropriate when awake, pt aware she was falling backwards but didn't attempt to self correct requiring max verbal cues  Physical Exam: Blood pressure (!) 103/91, pulse 61, temperature 98.4 F (36.9 C), temperature source Oral, resp. rate 18, height 5\' 6"  (1.676 m), weight 89.8 kg, SpO2 100 %. Physical Exam Constitutional:      General: She is not in acute distress. HENT:     Head: Normocephalic and atraumatic.     Nose: Nose normal.     Mouth/Throat:     Mouth: Mucous membranes are moist.  Eyes:     General: No scleral icterus.    Conjunctiva/sclera: Conjunctivae normal.  Cardiovascular:     Rate and Rhythm: Normal rate and regular rhythm.     Heart sounds: No murmur heard.   No gallop.  Pulmonary:     Effort: Pulmonary effort is normal. No respiratory distress.     Breath sounds: No wheezing.  Abdominal:     General: Bowel sounds are normal. There is no distension.     Palpations: Abdomen is soft.     Tenderness: There is no abdominal tenderness.  Musculoskeletal:        General: No swelling or tenderness. Normal range of motion.     Cervical back: Neck supple.  Skin:    General:  Skin is warm.     Coloration: Skin is not jaundiced or pale.  Neurological:     Comments: Patient is slow to arouse  No acute distress.  Makes eye contact with examiner.  Provides name and age.  Follows commands.  Fair awareness of deficits. Tracks to left and right. Moves RUE and RLE spontaneously. 3-4/5 LUE and 2-3/5LLE without consistent activation. Sensed pain on left side. No abnormal tone.   Psychiatric:     Comments: Flat, slowed    Results for orders placed or performed during the hospital encounter of 08/31/20 (from the past 48 hour(s))  Ferritin     Status: None   Collection Time: 09/01/20  8:07 AM  Result Value Ref Range   Ferritin 91 11 - 307 ng/mL    Comment: Performed at [facility], [place] [address], [place], [place] [address]  Iron and TIBC     Status: Abnormal   Collection Time: 09/01/20  8:07 AM  Result Value Ref Range   Iron 35 28 - 170 ug/dL   TIBC 221 (L) 250 - 450 ug/dL   Saturation Ratios 16 10.4 - 31.8 %   UIBC  186 ug/dL    Comment: Performed at [facility], [place] [address], [place], [place] [address]  SARS CORONAVIRUS 2 (TAT 6-24 HRS) Nasopharyngeal Nasopharyngeal Swab     Status: None   Collection Time: 09/01/20 12:57 PM   Specimen: Nasopharyngeal Swab  Result Value Ref Range   SARS Coronavirus 2 NEGATIVE NEGATIVE    Comment: (NOTE) SARS-CoV-2 target nucleic acids are NOT DETECTED.  The SARS-CoV-2 RNA is generally detectable in upper and lower respiratory specimens during the acute phase of infection. Negative results do not preclude SARS-CoV-2 infection, do not rule out co-infections with other pathogens, and should not be used as the sole basis for treatment or other patient management decisions. Negative results must be combined with clinical observations, patient history, and epidemiological information. The expected result is Negative.  Fact Sheet for Patients: SugarRoll.be  Fact  Sheet for Healthcare Providers: https://www.woods-mathews.com/  This test is not yet approved or cleared by the [place] FDA and  has been authorized for detection and/or diagnosis of SARS-CoV-2 by FDA under an Emergency Use Authorization (EUA). This EUA will remain  in effect (meaning this test can be used) for the duration of the COVID-19 declaration under Se ction 564(b)(1) of the Act, 21 U.S.C. section 360bbb-3(b)(1), unless the authorization is terminated or revoked sooner.  Performed at [facility], [place] [address] [place], [place] [address]   CBC with Differential/Platelet     Status: Abnormal   Collection Time: 09/02/20  2:44 AM  Result Value Ref Range   WBC 12.2 (H) 4.0 - 10.5 K/uL   RBC 3.62 (L) 3.87 - 5.11 MIL/uL   Hemoglobin 8.7 (L) 12.0 - 15.0 g/dL   HCT 28.3 (L) 36.0 - 46.0 %   MCV 78.2 (L) 80.0 - 100.0 fL   MCH 24.0 (L) 26.0 - 34.0 pg   MCHC 30.7 30.0 - 36.0 g/dL   RDW 16.3 (H) 11.5 - 15.5 %   Platelets 262 150 - 400 K/uL   nRBC 0.2 0.0 - 0.2 %   Neutrophils Relative % 77 %   Neutro Abs 9.3 (H) 1.7 - 7.7 K/uL   Lymphocytes Relative 16 %   Lymphs Abs 2.0 0.7 - 4.0 K/uL   Monocytes Relative 6 %   Monocytes Absolute 0.8 0.1 - 1.0 K/uL   Eosinophils Relative 0 %   Eosinophils Absolute 0.0 0.0 - 0.5 K/uL   Basophils Relative 0 %   Basophils Absolute 0.0 0.0 - 0.1 K/uL   Immature Granulocytes 1 %   Abs Immature Granulocytes 0.10 (H) 0.00 - 0.07 K/uL    Comment: Performed at [facility] [address] [place], [place] [address]  Basic metabolic panel     Status: Abnormal   Collection Time: 09/02/20  2:44 AM  Result Value Ref Range   Sodium 135 135 - 145 mmol/L   Potassium 3.8 3.5 - 5.1 mmol/L   Chloride 102 98 - 111 mmol/L   CO2 25 22 - 32 mmol/L   Glucose, Bld 104 (H) 70 - 99 mg/dL    Comment: Glucose reference range applies only to samples taken after fasting for at least 8 hours.   BUN 13 6 - 20 mg/dL   Creatinine, Ser 0.83  0.44 - 1.00 mg/dL   Calcium 9.4 8.9 - 10.3 mg/dL   GFR, Estimated >60 >60 mL/min    Comment: (NOTE) Calculated using the CKD-EPI Creatinine Equation (2021)    Anion gap 8 5 - 15  Comment: Performed at [facility], [place] [address], [place], [place] [address]  Magnesium     Status: None   Collection Time: 09/02/20  2:44 AM  Result Value Ref Range   Magnesium 2.1 1.7 - 2.4 mg/dL    Comment: Performed at [facility] [address], [place], [place] [address]  Renal function panel     Status: Abnormal   Collection Time: 09/03/20  1:57 AM  Result Value Ref Range   Sodium 136 135 - 145 mmol/L   Potassium 3.6 3.5 - 5.1 mmol/L   Chloride 105 98 - 111 mmol/L   CO2 24 22 - 32 mmol/L   Glucose, Bld 90 70 - 99 mg/dL    Comment: Glucose reference range applies only to samples taken after fasting for at least 8 hours.   BUN 11 6 - 20 mg/dL   Creatinine, Ser 0.64 0.44 - 1.00 mg/dL   Calcium 9.5 8.9 - 10.3 mg/dL   Phosphorus 4.1 2.5 - 4.6 mg/dL   Albumin 3.4 (L) 3.5 - 5.0 g/dL   GFR, Estimated >60 >60 mL/min    Comment: (NOTE) Calculated using the CKD-EPI Creatinine Equation (2021)    Anion gap 7 5 - 15    Comment: Performed at [facility] [address] [place], [place] [address]  Magnesium     Status: None   Collection Time: 09/03/20  1:57 AM  Result Value Ref Range   Magnesium 2.1 1.7 - 2.4 mg/dL    Comment: Performed at [facility] [address] [place], [place] [address]  CBC     Status: Abnormal   Collection Time: 09/03/20  1:57 AM  Result Value Ref Range   WBC 9.9 4.0 - 10.5 K/uL   RBC 3.61 (L) 3.87 - 5.11 MIL/uL   Hemoglobin 8.7 (L) 12.0 - 15.0 g/dL   HCT 28.2 (L) 36.0 - 46.0 %   MCV 78.1 (L) 80.0 - 100.0 fL   MCH 24.1 (L) 26.0 - 34.0 pg   MCHC 30.9 30.0 - 36.0 g/dL   RDW 16.3 (H) 11.5 - 15.5 %   Platelets 255 150 - 400 K/uL   nRBC 0.0 0.0 - 0.2 %    Comment: Performed at [facility] [address], [place], [place] [address]  TSH      Status: None   Collection Time: 09/03/20  1:57 AM  Result Value Ref Range   TSH 1.198 0.350 - 4.500 uIU/mL    Comment: Performed by a 3rd Generation assay with a functional sensitivity of <=0.01 uIU/mL. Performed at [facility], [place] [address], [place], [place] [address]   Vitamin B12     Status: None   Collection Time: 09/03/20  1:57 AM  Result Value Ref Range   Vitamin B-12 329 180 - 914 pg/mL    Comment: (NOTE) This assay is not validated for testing neonatal or myeloproliferative syndrome specimens for Vitamin B12 levels. Performed at [facility], [place] [address], [place], [place] [address]    MR BRAIN WO CONTRAST  Result Date: 09/01/2020 CLINICAL DATA:  60 year old female with lower extremity tingling. Encephalopathy. Status post coil embolization of anterior communicating artery aneurysm 1 week ago. 8 mm left MCA bifurcation aneurysm. EXAM: MRI HEAD WITHOUT CONTRAST TECHNIQUE: Multiplanar, multiecho pulse sequences of the brain and surrounding structures were obtained without intravenous contrast. COMPARISON:  CTA head and neck yesterday and earlier. Brain MRI 08/13/2013. FINDINGS: Brain:  Confluent restricted diffusion in the right ACA territory (series 5, image 87), but also affecting some cortex of the cingulate gyrus on the left (series 12, image 48). And there is also dense restricted diffusion in the anterior right basal ganglia related to penetrating arteries of the proximal ACA. Posteriorly on the right a small area of medial perirolandic cortex is also affected (series 5, image 89). Questionable also punctate abnormal diffusion in the right superior cerebellum on series 5, image 68, but not correlated on the coronal images. T2 and FLAIR hyperintensity in keeping with cytotoxic edema. No parenchymal hemorrhage or significant mass effect. No midline shift or ventriculomegaly. Small widely scattered cerebral white matter T2 and FLAIR hyperintense foci are in a similar  pattern to the 2015 MRI, mildly progressed since that time. No chronic cortical encephalomalacia or definite chronic cerebral blood products. No midline shift, evidence of mass lesion, extra-axial collection or acute intracranial hemorrhage. Basilar cisterns are patent. Cervicomedullary junction and pituitary are within normal limits. Vascular: Major intracranial vascular flow voids are stable since 2015. T2* susceptibility artifact related to the anterior communicating artery aneurysm stent assisted coiling. Skull and upper cervical spine: Negative. Visualized bone marrow signal is within normal limits. Sinuses/Orbits: Negative. Paranasal Visualized paranasal sinuses and mastoids are stable and well aerated. Other: Negative visible scalp and face soft tissues. IMPRESSION: 1. Confluent Acute Right ACA territory infarct. This includes involvement of the anterior basal ganglia (medial striate artery territory). Ischemia also in the Left ACA territory but limited to cortex of the cingulate gyrus. 2. Cytotoxic edema but no associated hemorrhage or significant mass effect. Electronically Signed   By: Genevie Ann M.D.   On: 09/01/2020 07:53   ECHOCARDIOGRAM COMPLETE  Result Date: 09/02/2020    ECHOCARDIOGRAM REPORT   Patient Name:   Betty Archer Date of Exam: 09/02/2020 Medical Rec #:  [id number]      Height:       66.0 in Accession #:    [id number]     Weight:       198.0 lb Date of Birth:  10/18/1960      BSA:          1.991 m Patient Age:    27 years       BP:           130/66 mmHg Patient Gender: F              HR:           64 bpm. Exam Location:  Inpatient Procedure: 2D Echo, Color Doppler and Cardiac Doppler Indications:    Stroke I63.9  History:        Patient has prior history of Echocardiogram examinations, most                 recent 08/13/2013. Risk Factors:Dyslipidemia and Diabetes.  Sonographer:    Bernadene Person RDCS Referring Phys: [id number] OLADAPO ADEFESO IMPRESSIONS  1. Left ventricular ejection fraction,  by estimation, is 65 to 70%. The left ventricle has normal function. The left ventricle has no regional wall motion abnormalities. Left ventricular diastolic parameters are consistent with Grade II diastolic dysfunction (pseudonormalization).  2. Right ventricular systolic function is normal. The right ventricular size is normal. Tricuspid regurgitation signal is inadequate for assessing PA pressure.  3. The mitral valve is normal in structure. No evidence of mitral valve regurgitation. No evidence of mitral stenosis.  4. The aortic valve is tricuspid. Aortic valve regurgitation is not visualized. No aortic stenosis  is present.  5. The inferior vena cava is normal in size with greater than 50% respiratory variability, suggesting right atrial pressure of 3 mmHg. FINDINGS  Left Ventricle: Left ventricular ejection fraction, by estimation, is 65 to 70%. The left ventricle has normal function. The left ventricle has no regional wall motion abnormalities. The left ventricular internal cavity size was normal in size. There is  no left ventricular hypertrophy. Left ventricular diastolic parameters are consistent with Grade II diastolic dysfunction (pseudonormalization). Right Ventricle: The right ventricular size is normal. No increase in right ventricular wall thickness. Right ventricular systolic function is normal. Tricuspid regurgitation signal is inadequate for assessing PA pressure. Left Atrium: Left atrial size was normal in size. Right Atrium: Right atrial size was normal in size. Pericardium: There is no evidence of pericardial effusion. Mitral Valve: The mitral valve is normal in structure. No evidence of mitral valve regurgitation. No evidence of mitral valve stenosis. Tricuspid Valve: The tricuspid valve is normal in structure. Tricuspid valve regurgitation is not demonstrated. Aortic Valve: The aortic valve is tricuspid. Aortic valve regurgitation is not visualized. No aortic stenosis is present. Pulmonic  Valve: The pulmonic valve was normal in structure. Pulmonic valve regurgitation is not visualized. Aorta: The aortic root is normal in size and structure. Venous: The inferior vena cava is normal in size with greater than 50% respiratory variability, suggesting right atrial pressure of 3 mmHg. IAS/Shunts: No atrial level shunt detected by color flow Doppler.  LEFT VENTRICLE PLAX 2D LVIDd:         3.60 cm  Diastology LVIDs:         2.00 cm  LV e' medial:    6.24 cm/s LV PW:         1.00 cm  LV E/e' medial:  14.6 LV IVS:        1.00 cm  LV e' lateral:   7.50 cm/s LVOT diam:     2.00 cm  LV E/e' lateral: 12.1 LV SV:         84 LV SV Index:   42 LVOT Area:     3.14 cm  RIGHT VENTRICLE RV S prime:     11.60 cm/s TAPSE (M-mode): 2.1 cm LEFT ATRIUM             Index       RIGHT ATRIUM           Index LA diam:        2.20 cm 1.10 cm/m  RA Area:     16.40 cm LA Vol (A2C):   30.4 ml 15.27 ml/m RA Volume:   42.60 ml  21.39 ml/m LA Vol (A4C):   27.5 ml 13.81 ml/m LA Biplane Vol: 31.0 ml 15.57 ml/m  AORTIC VALVE LVOT Vmax:   137.00 cm/s LVOT Vmean:  90.600 cm/s LVOT VTI:    0.267 m  AORTA Ao Root diam: 3.10 cm Ao Asc diam:  3.20 cm MITRAL VALVE MV Area (PHT): 3.85 cm    SHUNTS MV Decel Time: 197 msec    Systemic VTI:  0.27 m MV E velocity: 90.80 cm/s  Systemic Diam: 2.00 cm MV A velocity: 51.00 cm/s MV E/A ratio:  1.78 Loralie Champagne MD Electronically signed by Loralie Champagne MD Signature Date/Time: 09/02/2020/2:38:04 PM    Final        Medical Problem List and Plan: 1.  Altered mental status with decreased functional mobility/left lower extremity weakness secondary to large right ACA and small left ACA infarct likely  related to recent ACOM aneurysm post coiling and stenting  -patient may10-1 shower  -ELOS/Goals: 10-14 days, supervision goals with PT, OT, SLP 2.  Antithrombotics: -DVT/anticoagulation: SCDs  -antiplatelet therapy: Brilinta 90 mg twice daily, aspirin 81 mg daily 3. Pain Management: Lidoderm patch  as directed, Tylenol as needed 4. Mood: Team to provide emotional support as necessary  -antipsychotic agents: N/A 5. Neuropsych: This patient is capable of making decisions on her own behalf. 6. Skin/Wound Care: Routine skin checks 7. Fluids/Electrolytes/Nutrition: Routine in and outs with follow-up chemistries  -encourage appropriate PO 8.  Hyperlipidemia.  Lipitor 9.  Hypertension.  Monitor with increased mobility   10.  History of tobacco abuse.  Counseling     Cathlyn Parsons, PA-C 09/03/2020

## 2020-09-03 NOTE — Progress Notes (Signed)
Patient transferred to 4w-02

## 2020-09-03 NOTE — Progress Notes (Addendum)
STROKE TEAM PROGRESS NOTE  INTERVAL HISTORY Her son is at the bedside. Left leg is weaker than right, but she notes improvement from yesterday. WE discussed post stroke depression and will start Prozac. Stroke work up complete and she is ready to go to CIR.   Vitals:   09/02/20 1934 09/02/20 2332 09/03/20 0429 09/03/20 0905  BP: 119/78 114/62 (!) 103/91 104/62  Pulse: 85 79 61 60  Resp: 20 16 18 18   Temp: 98 F (36.7 C) 98.4 F (36.9 C) 98.4 F (36.9 C) 98.2 F (36.8 C)  TempSrc: Oral Oral Oral Oral  SpO2: 100% 100% 100% 100%  Weight:      Height:       CBC:  Recent Labs  Lab 08/31/20 2100 08/31/20 2148 09/02/20 0244 09/03/20 0157  WBC 9.7   < > 12.2* 9.9  NEUTROABS 5.6  --  9.3*  --   HGB 9.3*   < > 8.7* 8.7*  HCT 30.1*   < > 28.3* 28.2*  MCV 78.8*   < > 78.2* 78.1*  PLT 246   < > 262 255   < > = values in this interval not displayed.    Basic Metabolic Panel:  Recent Labs  Lab 09/01/20 0500 09/02/20 0244 09/03/20 0157  NA 138 135 136  K 4.0 3.8 3.6  CL 108 102 105  CO2 22 25 24   GLUCOSE 107* 104* 90  BUN 13 13 11   CREATININE 0.59 0.83 0.64  CALCIUM 9.1 9.4 9.5  MG 2.0 2.1 2.1  PHOS 3.8  --  4.1    Lipid Panel:  Recent Labs  Lab 09/01/20 0500  CHOL 198  TRIG 82  HDL 51  CHOLHDL 3.9  VLDL 16  LDLCALC 131*    HgbA1c:  Recent Labs  Lab 09/01/20 0500  HGBA1C 5.3    Urine Drug Screen:  Recent Labs  Lab 08/31/20 2133  LABOPIA NONE DETECTED  COCAINSCRNUR NONE DETECTED  LABBENZ NONE DETECTED  AMPHETMU NONE DETECTED  THCU NONE DETECTED  LABBARB NONE DETECTED     Alcohol Level No results for input(s): ETH in the last 168 hours.  IMAGING past 24 hours No results found.  PHYSICAL EXAM General: Appears well-developed ; no acute distress. Psych: Affect appropriate to situation; seems flat/depressed today.  Eyes: No scleral injection HENT: No OP obstrucion Head: Normocephalic.  Cardiovascular: Normal rate and regular rhythm.   Respiratory: Effort normal and breath sounds normal to anterior ascultation GI: Soft.  No distension. There is no tenderness.  Skin: WDI    Neurological Examination Mental Status: Alert, oriented, thought content appropriate.  Speech fluent without evidence of aphasia. Able to follow 3 step commands without difficulty. Cranial Nerves: II: Visual fields grossly normal,  III,IV, VI: ptosis not present, extra-ocular motions intact bilaterally, pupils equal, round, reactive to light and accommodation V,VII: smile symmetric, facial light touch sensation normal bilaterally VIII: hearing normal bilaterally IX,X: uvula rises symmetrically XI: bilateral shoulder shrug XII: midline tongue extension Motor: Right : Upper extremity   5/5    Left:     Upper extremity   5/5  Lower extremity   4+/5    Lower extremity   3/5 Tone and bulk:normal tone throughout; no atrophy noted Sensory: Pinprick and light touch intact throughout, bilaterally Deep Tendon Reflexes: 2+ and symmetric throughout Plantars: Right: downgoing   Left: downgoing Cerebellar: normal finger-to-nose, no ataxia outside of the LE weakness Gait: normal gait and station   ASSESSMENT/PLAN Betty Archer  is a 60 y.o. female with history of HLD who had recent embolization and coiling of the R ACA aneurysm on 08/25/20 and d/c home. who presents with R ACA territory and a small L ACA territory infarct. Her neurologic examination is notable for L lower extremity flaccid with mild weakness of RLE. Could potentially be an embolic/thrombic infarct of the R ACA secondary to recent aneurysm repair.  Stroke: large Right ACA and small left ACA infarcts, likely related to recent ACOM aneurysm post coiling and stenting, pt stated compliance with meds Code Stroke CT head No acute abnormality. ASPECTS 10.    CTA head & neck 1. No emergent large vessel occlusion or high-grade stenosis. 2. 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 3. Coil mass  at the anterior communicating artery without visible residual aneurysm filling. 4. Aberrant right subclavian artery. MRI  Confluent Acute Right ACA territory infarct. This includes involvement of the anterior basal ganglia (medial striate artery territory). Ischemia also in the Left ACA territory but limited to cortex of the cingulate gyrus. 2. Cytotoxic edema but no associated hemorrhage or significant mass effect. 2D Echo 40% EF, Gr 2 diastolic dysfunction.  LDL 131 HgbA1c 5.3 P2Y12 pending still today VTE prophylaxis - SCDs aspirin 81 mg daily and Brilinta (ticagrelor) 90 mg bid prior to admission, now on aspirin 81 mg daily and Brilinta (ticagrelor) 90 mg bid. She states she has not missed any doses at home.  Therapy recommendations:  CIR Disposition:  today is fine from stroke stand point  Cerebral aneurysms Recent coiling and stenting of right ACOM aneurysm with Dr. Norma Fredrickson  untreated left MCA bifurcation 8 mm aneurysm IR following  Hypertension Home meds:  none Stable Permissive hypertension (OK if < 220/120) but gradually normalize in 5-7 days Long-term BP goal normotensive  Hyperlipidemia Home meds:  none LDL 131, goal < 70 Add lipitor 80mg   High intensity statin  Continue statin at discharge  Other Stroke Risk Factors Cigarette smoker; advised to stop smoking ETOH use, alcohol level No results found for requested labs within last 26280 hours., advised to stop daily drinking.  Obesity, Body mass index is 31.95 kg/m., BMI >/= 30 associated with increased stroke risk, recommend weight loss, diet and exercise as appropriate   Other Active Problems Post stroke depression- will start Prozac today prior to dc    Out pt f/u with [facility] in 4-6 weeks.  Reviewed stroke education with pt and son at bedside.  We will s/o. Please call back if needed.   Hospital day # 2  Desiree Metzger-Cihelka, ARNP-C, ANVP-BC Pager: [phone]   ATTENDING NOTE: I reviewed above  note and agree with the assessment and plan. Pt was seen and examined.   No acute event overnight.  Patient neuro stable.  Still has bilateral lower extremity weakness left more than right, however continue to improve.  PT/OT recommended CIR.  P2 Y 12 still pending today, but patient stated compliant with medication.  Continue aspirin Brilinta and statin.  She will continue follow-up with Dr. Norma Fredrickson in IR and neurology stroke clinic in [place] in 4 weeks.  For detailed assessment and plan, please refer to above as I have made changes wherever appropriate.   Neurology will sign off. Please call with questions. Pt will follow up with stroke clinic NP at [facility] in about 4 weeks. Thanks for the consult.   Rosalin Hawking, MD PhD Stroke Neurology 09/03/2020 6:57 PM     To contact Stroke Continuity provider, please refer to [email or web address]  After hours, contact General Neurology

## 2020-09-03 NOTE — Progress Notes (Signed)
Inpatient Rehabilitation Medication Review by a Pharmacist  A complete drug regimen review was completed for this patient to identify any potential clinically significant medication issues.  Clinically significant medication issues were identified:  no  Check AMION for pharmacist assigned to patient if future medication questions/issues arise during this admission.  Pharmacist comments:   Time spent performing this drug regimen review (minutes):  5 minutes   Lolita Rieger 09/03/2020 6:54 PM

## 2020-09-03 NOTE — Progress Notes (Signed)
Show:Clear all _0 Written_1 Templated_2 Copied  Added by: _3 Cristina Gong, RN_4 [name], MD   _5 Hover for details                                                                                                                                                                                                                                                                                                                                                                             PMR Admission Coordinator Pre-Admission Assessment   Patient: Betty Archer is an 60 y.o., female MRN: [id number] DOB: 1960-11-03 Height: _6  (167.6 cm) Weight: 89.8 kg   Insurance Information HMO:     PPO:      PCP:      IPA:      80/20:      OTHER: PRIMARY: uninsured Estimate of cost of care provided to son, [name] at [employer]: referred to med assist to help with medicaid and disability applications   The Therapist, art Information Summary" for patients in [place] with attached "Privacy Act [place] Records" was provided and verbally reviewed with: N/A   Emergency Contact Information Contact Information       Name Relation Home Work Mobile    Bonsignore,[place] Son     [phone]    Ruberta, [place] [phone]   [phone]    [name]     [phone]           Current Medical History  Patient Admitting Diagnosis: CVA   History of Present Illness: Betty Archer. Beavin is a 60 year old right-handed female with history of hypertension, hyperlipidemia, tobacco use as well as history of  a 6 mm right A2/ACA aneurysm and an 8 mm left MCA bifurcation aneurysm identified after patient with complaints of headache and right arm numbness in June 2022 followed by neurology services and  underwent diagnostic cerebral angiogram and right aneurysmal embolization on 08/25/2020 per interventional radiology and left MCA aneurysm unrepaired with course complicated by right groin hematoma that required prolonged initial intubation was discharged home 08/27/2020 maintained on aspirin and Brilinta..   Presented 08/31/2020 with altered mental status and lower extremity weakness.  Cranial CT scan negative for acute changes.  CT angiogram of head and neck no emergent large vessel occlusion or high-grade stenosis.  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  Coil mass at the anterior communicating artery without visible residual aneurysmal filling.  MRI of the brain showed confluent right acute ACA territory infarction including involvement of the anterior basal ganglia and ischemia also in the left ACA territory with no associated hemorrhage or mass-effect.  Echocardiogram with ejection fraction of 65 to 51% grade 2 diastolic dysfunction no regional wall motion abnormalities.  Admission chemistries were unremarkable, hemoglobin 9.3, urine drug screen negative.  Neurology follow-up currently maintained on aspirin as well as Brilinta for CVA prophylaxis.  Tolerating a regular consistency diet.   Complete NIHSS TOTAL: 4   Patient's medical record from [facility]  has been reviewed by the rehabilitation admission coordinator and physician.   Past Medical History      Past Medical History:  Diagnosis Date   Hyperlipidemia        Family History   family history includes Breast cancer in her maternal aunt and maternal grandmother; Hypertension in her mother; Lung cancer in her father.   Prior Rehab/Hospitalizations Has the patient had prior rehab or hospitalizations prior to admission? Yes   Has the patient had major surgery during 100 days prior to admission? Yes              Current Medications   Current Facility-Administered Medications:   acetaminophen (TYLENOL) tablet 650 mg, 650 mg, Oral, Q6H  PRN, Starla Link, Kshitiz, MD, 650 mg at 09/01/20 1321   aspirin chewable tablet 81 mg, 81 mg, Oral, Daily, Alekh, Kshitiz, MD, 81 mg at 09/03/20 0857   atorvastatin (LIPITOR) tablet 80 mg, 80 mg, Oral, QHS, Alekh, Kshitiz, MD, 80 mg at 09/02/20 2136   FLUoxetine (PROZAC) capsule 20 mg, 20 mg, Oral, Daily, Metzger-Cihelka, Desiree, NP   lidocaine (LIDODERM) 5 % 1 patch, 1 patch, Transdermal, Q24H, Adefeso, Oladapo, DO, 1 patch at 09/03/20 0558   ondansetron (ZOFRAN) injection 4 mg, 4 mg, Intravenous, Q6H PRN, Starla Link, Kshitiz, MD   ticagrelor (BRILINTA) tablet 90 mg, 90 mg, Oral, BID, Alekh, Kshitiz, MD, 90 mg at 09/03/20 0857   Patients Current Diet:  Diet Order                  Diet - low sodium heart healthy             Diet Heart Room service appropriate? Yes; Fluid consistency: Thin  Diet effective now                         Precautions / Restrictions Precautions Precautions: Fall Precaution Comments: falls asleep easily Restrictions Weight Bearing Restrictions: No    Has the patient had 2 or more falls or a fall with injury in the past year? 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 Independent   Indoor Mobility: Did the patient need assistance with walking from room to room (with or without device)? Independent   Stairs: Did the patient need assistance with internal or external stairs (with or without device)? Independent   Functional Cognition: Did the patient need help planning regular tasks such as shopping or remembering to take medications? Independent   Home Assistive Devices / Equipment Home Assistive Devices/Equipment: Eyeglasses Home Equipment: None   Prior Device Use: Indicate devices/aids used by the patient prior to current illness, exacerbation or injury? None of the above    Current Functional Level Cognition   Overall Cognitive Status: Impaired/Different from baseline Current Attention Level: Focused Orientation Level: Oriented to person, Oriented to place, Oriented to situation, Disoriented to time Following Commands: Follows one step commands with increased time Safety/Judgement: Decreased awareness of deficits General Comments: pt would fall asleep freq t/o session however was appropriate when awake, pt aware she was falling backwards but didn't attempt to self correct requiring max verbal cues    Extremity Assessment (includes Sensation/Coordination)   Upper Extremity Assessment: LUE deficits/detail LUE Deficits / Details: grip trace compared to R grip 3+/5, unable to support L UE against gravity without assistance. LUE Coordination: decreased fine motor, decreased gross motor  Lower Extremity Assessment: Defer to PT evaluation RLE Deficits / Details: grossly 4 strength, decreased effort, lethargic LLE Deficits / Details: increased tone into extension when rolling, moving to bed edge.  no dorsiflexion noted, trace knee extension LLE Sensation: decreased proprioception, decreased light touch LLE Coordination: decreased gross motor, decreased fine motor     ADLs   Overall ADL's : Needs assistance/impaired Grooming: Moderate assistance, Bed level Upper Body Bathing: Maximal assistance, Bed level Lower Body Bathing: Total assistance, Bed level Upper Body Dressing : Maximal assistance, Bed level Lower Body Dressing: Total assistance, Bed level Toilet Transfer Details (indicate cue type and reason): deferred due to zero sitting balance needing mod to max A to maintain upright position at edge of bed Toileting- Clothing Manipulation and Hygiene: Total assistance, Bed level General ADL Comments: patient requiring significant assistance with all ADL tasks due to decreased strength, ROM, coordination, cognition, balance     Mobility   Overal bed mobility:  Needs Assistance Bed Mobility: Supine to Sit Rolling: Total assist, +2 for physical assistance, +2 for safety/equipment Sidelying to sit: +2 for physical assistance, +2 for safety/equipment, Total assist Supine to sit: Mod assist, +2 for physical assistance, HOB elevated Sit to sidelying: Total assist, +2 for physical assistance, +2 for safety/equipment General bed mobility comments: pt able to bring R LE to EOB, maxA for L LE management, with max verbal cues pt attempted to use L UE to assist but ultimately required modAx2 for trunk elevation and pivoting hips to EOB     Transfers   Overall transfer level: Needs assistance Equipment used: 2 person hand held assist (face to face transfer) Transfers: Sit to/from Stand, Stand Pivot Transfers Sit to Stand: Mod assist, +2 physical assistance Stand pivot transfers: Mod assist, +2 physical assistance General transfer comment: pt with good power up, modA to maintain balance as pt with posterior bias, max directional verbal cues to sequence stepping to chair, L knee blocked due to instability when advancing R LE but able to stand without buckling     Ambulation / Gait / Stairs / Proofreader /  Balance Dynamic Sitting Balance Sitting balance - Comments: pt with posterior lean requiring max directional verbal cues to self correct, utimately requiring modA to return to midline Balance Overall balance assessment: Needs assistance Sitting-balance support: Feet supported, Bilateral upper extremity supported Sitting balance-Leahy Scale: Poor Sitting balance - Comments: pt with posterior lean requiring max directional verbal cues to self correct, utimately requiring modA to return to midline Standing balance support: During functional activity, Bilateral upper extremity supported Standing balance-Leahy Scale: Poor Standing balance comment: dependent on external support     Special needs/care consideration Covid + 7/5. infection  PREVENTION REMOVED ISOLATION PRECAUTIONS ON 7/14    Previous Home Environment  Living Arrangements:  (sons, [place] and [place] live with patient)  Lives With: Son (two sons) Available Help at Discharge: Family, Available 24 hours/day ([place] to take [employer]) Type of Home: Apartment Home Layout: One level Home Access: Stairs to enter Entrance Stairs-Rails: Right Entrance Stairs-Number of Steps:  (second floor apartment) Bathroom Shower/Tub: Public librarian, Multimedia programmer: Standard Bathroom Accessibility: Yes How Accessible: Accessible via walker [place]: No Additional Comments: son has been living with patient for past year   Discharge Living Setting Plans for Discharge Living Setting: Patient's home, Apartment, Lives with (comment) (2 of her 3 sons live with patient) Type of Home at Discharge: Apartment Discharge Home Layout: One level (second floor apartment) Discharge Home Access: Stairs to enter Entrance Stairs-Rails: Right Entrance Stairs-Number of Steps: 15 + Discharge Bathroom Shower/Tub: Tub/shower unit, Walk-in shower Discharge Bathroom Toilet: Standard Discharge Bathroom Accessibility: Yes How Accessible: Accessible via walker Does the patient have any problems obtaining your medications?: Yes (Describe) (uninsured)   Social/Family/Support Systems 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 Information: sons [employer] and [name] are main contacts Anticipated Caregiver: sons and family; [place] to take [employer] Anticipated Ambulance person Information: see above Ability/Limitations of Caregiver: sons works but schedules are opposite of one Careers information officer Availability: 24/7 Discharge Plan Discussed with Primary Caregiver: Yes Is Caregiver In Agreement with Plan?: Yes Does Caregiver/Family have Issues with Lodging/Transportation while Pt is in Rehab?: No   Goals Patient/Family Goal for Rehab: supervision with PT, OT and SLP Expected length of  stay: ELOS 10 to 14 days Additional Information: post COVID Pt/Family Agrees to Admission and willing to participate: Yes Program Orientation Provided & Reviewed with Pt/Caregiver Including Roles  & Responsibilities: Yes   Decrease burden of Care through IP rehab admission: N/A   Possible need for SNF placement upon discharge: NOT ANTICIPATED   Patient Condition: I have reviewed medical records from [facility] , spoken with CSW, and patient and son. I met with patient at the bedside for inpatient rehabilitation assessment.  Patient will benefit from ongoing PT, OT, and SLP, can actively participate in 3 hours of therapy a day 5 days of the week, and can make measurable gains during the admission.  Patient will also benefit from the coordinated team approach during an Inpatient Acute Rehabilitation admission.  The patient will receive intensive therapy as well as Rehabilitation physician, nursing, social worker, and care management interventions.  Due to bladder management, bowel management, safety, skin/wound care, disease management, medication administration, pain management, and patient education the patient requires 24 hour a day rehabilitation nursing.  The patient is currently MOD ASSIST OVERALL with mobility and basic ADLs.  Discharge setting and therapy post discharge at home with outpatient is anticipated.  Patient has agreed to participate in the Acute Inpatient Rehabilitation Program and will admit today.  Preadmission Screen Completed By:  Cleatrice Burke, 09/03/2020 11:55 AM ______________________________________________________________________   Discussed status with Dr. Naaman Plummer on  09/03/2020 at 1202 and received approval for admission today.   Admission Coordinator:  Cleatrice Burke, RN, time 1202 Date 09/03/2020    Assessment/Plan: Diagnosis: Right ACA/left ACA  infarctions Does the need for close, 24 hr/day Medical supervision in concert with the patient's  rehab needs make it unreasonable for this patient to be served in a less intensive setting? Yes Co-Morbidities requiring supervision/potential complications: HTN, post-stroke sequelae, post-covid Due to bladder management, bowel management, safety, skin/wound care, disease management, medication administration, pain management, and patient education, does the patient require 24 hr/day rehab nursing? Yes Does the patient require coordinated care of a physician, rehab nurse, PT, OT, and SLP to address physical and functional deficits in the context of the above medical diagnosis(es)? Yes Addressing deficits in the following areas: balance, endurance, locomotion, strength, transferring, bowel/bladder control, bathing, dressing, feeding, grooming, toileting, cognition, speech, and psychosocial support Can the patient actively participate in an intensive therapy program of at least 3 hrs of therapy 5 days a week? Yes The potential for patient to make measurable gains while on inpatient rehab is excellent Anticipated functional outcomes upon discharge from inpatient rehab: supervision PT, supervision OT, supervision SLP Estimated rehab length of stay to reach the above functional goals is: 10-14 days Anticipated discharge destination: Home 10. Overall Rehab/Functional Prognosis: excellent     MD Signature: [name], MD, [place] [facility] 09/03/2020           Revision History                                  Note Details  Author [name], MD File Time 09/03/2020 12:10 PM  Author Type Physician Status Signed  Last Editor [name], MD Service Physical Medicine and [place] # [id number] Admit Date 09/03/2020

## 2020-09-03 NOTE — Progress Notes (Signed)
Physical Therapy Treatment Patient Details Name: Betty Archer MRN: [id number] DOB: January 02, 1961 Today's Date: 09/03/2020    History of Present Illness 60 y.o. female with medical history significant for hypertension, hyperlipidemia who recently had a repair of right ACA aneurysm, as well as left MCA aneurysm (unrepaired) which resulted in right groin hematoma that required intubation after the procedure with subsequent discharge on 08/27/2020 presented 08/31/20  with altered mental status and leg weakness.  CT -unchanged 8 mm left MCA bifurcation aneurysm.  Teleneurology recommended stroke work-up. MRI-Confluent Acute Right ACA territory infarct. This includes  involvement of the anterior basal ganglia (medial striate artery  territory).  Ischemia also in the Left ACA territory but limited to cortex of the  cingulate gyrus.  2. Cytotoxic edema but no associated hemorrhage or significant mass  effect.    PT Comments    Patient received in bed, still lethargic and sleepy but was appropriate (although very distracted by telebox and end of gait belt) when awake. Still needs heavy +2 assist for mobility due to posterior lean, lethargy, and limited initiation today. Session focus on practicing multiple sit to stand transfers, maintaining sitting balance, and cross-midline reaches/weight shifts in standing. Really has very limited awareness of deficits and LOB with limited initiation which makes it difficult for her to correct when she is leaning posteriorly or to one side. Also question if she may have some level of possible apraxia? Left in bed with all needs met, bed alarm active awaiting transfer to CIR.     Follow Up Recommendations  CIR     Equipment Recommendations  Rolling walker with 5" wheels;3in1 (PT) (maybe WC)    Recommendations for Other Services       Precautions / Restrictions Precautions Precautions: Fall Precaution Comments: falls asleep easily Restrictions Weight Bearing  Restrictions: No    Mobility  Bed Mobility Overal bed mobility: Needs Assistance Bed Mobility: Supine to Sit;Sit to Supine Rolling: Mod assist   Supine to sit: Mod assist;+2 for physical assistance Sit to supine: Max assist;+2 for physical assistance  General bed mobility comments: needed Mod-MaxAx2 for bed mobility today, partially due to being so sleepy and partly for initiation. Did try to use L UE and move L LE today.    Transfers Overall transfer level: Needs assistance Equipment used: 2 person hand held assist Transfers: Sit to/from Stand Sit to Stand: Mod assist;+2 physical assistance         General transfer comment: ModAx2 for multiple sit to stands, min L knee block when transitioning to standing, cues for sequeuncing. Strong posterior lean with minimal awareness or attempts to correct once standing.  Ambulation/Gait             General Gait Details: deferred- strong posterior lean   Stairs             Wheelchair Mobility    Modified Rankin (Stroke Patients Only)       Balance Overall balance assessment: Needs assistance Sitting-balance support: Feet supported;Bilateral upper extremity supported Sitting balance-Leahy Scale: Poor Sitting balance - Comments: periods of posterior lean, one period of right then left lean with no initiation to correct and required ModA to return to midline   Standing balance support: During functional activity;Bilateral upper extremity supported Standing balance-Leahy Scale: Poor Standing balance comment: dependent on external support                            Cognition Arousal/Alertness: Lethargic  Behavior During Therapy: Flat affect (easily falls asleep) Overall Cognitive Status: Impaired/Different from baseline Area of Impairment: Attention;Problem solving;Awareness;Safety/judgement;Following commands                   Current Attention Level: Focused   Following Commands: Follows one  step commands with increased time;Follows one step commands inconsistently Safety/Judgement: Decreased awareness of deficits Awareness: Intellectual Problem Solving: Decreased initiation;Requires verbal cues;Requires tactile cues General Comments: often falling asleep during session but appropriate when awake; made no efforts to correct falling backwards or laterally in sitting but did state "you're not catching me" when asked what happened/why is she leaning. Very distracted by wires on telebox and gait belt when awake today.      Exercises      General Comments        Pertinent Vitals/Pain Pain Assessment: Faces Faces Pain Scale: No hurt Pain Intervention(s): Limited activity within patient's tolerance;Monitored during session    Home Living                      Prior Function            PT Goals (current goals can now be found in the care plan section) Acute Rehab PT Goals Patient Stated Goal: per son, to be able to walk PT Goal Formulation: With patient/family Time For Goal Achievement: 09/15/20 Potential to Achieve Goals: Fair Progress towards PT goals: Progressing toward goals    Frequency    Min 4X/week      PT Plan Current plan remains appropriate    Co-evaluation              AM-PAC PT "6 Clicks" Mobility   Outcome Measure  Help needed turning from your back to your side while in a flat bed without using bedrails?: A Lot Help needed moving from lying on your back to sitting on the side of a flat bed without using bedrails?: Total Help needed moving to and from a bed to a chair (including a wheelchair)?: Total Help needed standing up from a chair using your arms (e.g., wheelchair or bedside chair)?: Total Help needed to walk in hospital room?: Total Help needed climbing 3-5 steps with a railing? : Total 6 Click Score: 7    End of Session Equipment Utilized During Treatment: Gait belt Activity Tolerance: Patient limited by  lethargy Patient left: in bed;with call bell/phone within reach;with bed alarm set Nurse Communication: Mobility status PT Visit Diagnosis: Unsteadiness on feet (R26.81);Difficulty in walking, not elsewhere classified (R26.2);Other symptoms and signs involving the nervous system (R29.898);Hemiplegia and hemiparesis Hemiplegia - Right/Left: Left Hemiplegia - dominant/non-dominant: Non-dominant Hemiplegia - caused by: Cerebral infarction     Time: 0722-5750 PT Time Calculation (min) (ACUTE ONLY): 21 min  Charges:  $Neuromuscular Re-education: 8-22 mins                    Windell Norfolk, DPT, PN1   Supplemental Physical Therapist [place]    Pager [phone] Acute Rehab Office [phone]

## 2020-09-04 MED ORDER — AMANTADINE HCL 100 MG PO CAPS
100.0000 mg | ORAL_CAPSULE | Freq: Every day | ORAL | Status: DC
  Administered 2020-09-05 – 2020-09-06 (×2): 100 mg via ORAL
  Filled 2020-09-04 (×2): qty 1

## 2020-09-04 NOTE — Plan of Care (Signed)
  Problem: Consults Goal: RH STROKE PATIENT EDUCATION Description: See Patient Education module for education specifics  Outcome: Progressing   Problem: RH BOWEL ELIMINATION Goal: RH STG MANAGE BOWEL WITH ASSISTANCE Description: STG Manage Bowel with Supervision Assistance. Outcome: Progressing   Problem: RH SKIN INTEGRITY Goal: RH STG MAINTAIN SKIN INTEGRITY WITH ASSISTANCE Description: STG Maintain Skin Integrity With Supervision Assistance. Outcome: Progressing   Problem: RH SAFETY Goal: RH STG ADHERE TO SAFETY PRECAUTIONS W/ASSISTANCE/DEVICE Description: STG Adhere to Safety Precautions With supervision Assistance/Device. Outcome: Progressing Goal: RH STG DECREASED RISK OF FALL WITH ASSISTANCE Description: STG Decreased Risk of Fall With supervision Assistance. Outcome: Progressing   Problem: RH PAIN MANAGEMENT Goal: RH STG PAIN MANAGED AT OR BELOW PT'S PAIN GOAL Description: < 3 on a 0-10 pain scale. Outcome: Progressing   Problem: RH KNOWLEDGE DEFICIT Goal: RH STG INCREASE KNOWLEDGE OF STROKE PROPHYLAXIS Description: Patient will be able to demonstrate knowledge of medications used to prevent future strokes with educational materials and handouts provided by staff independently at discharge. Outcome: Progressing

## 2020-09-04 NOTE — Evaluation (Signed)
Speech Language Pathology Assessment and Plan  Patient Details  Name: Betty Archer MRN: [id number] Date of Birth: Mar 02, 1960  SLP Diagnosis: Cognitive Impairments  Rehab Potential: Good ELOS: 10-12 days    Today's Date: 09/04/2020 SLP Individual Time: 1300-1355 SLP Individual Time Calculation (min): 45 min   Hospital Problem: Principal Problem:   Acute ischemic cerebrovascular accident (CVA) involving anterior cerebral artery territory [facility])  Past Medical History:  Past Medical History:  Diagnosis Date   Hyperlipidemia    Past Surgical History:  Past Surgical History:  Procedure Laterality Date   BREAST BIOPSY      Core biopsy done on April 2003   COLONOSCOPY     IR 3D INDEPENDENT [facility]KST  08/25/2020   IR 3D INDEPENDENT [facility]KST  08/25/2020   IR ANGIO INTRA EXTRACRAN SEL INTERNAL CAROTID BILAT MOD SED  08/25/2020   IR ANGIO VERTEBRAL SEL VERTEBRAL BILAT MOD SED  08/25/2020   IR CT HEAD LTD  08/25/2020   IR INTRA CRAN STENT  08/25/2020   IR TRANSCATH/EMBOLIZ  08/25/2020   IR US GUIDE VASC ACCESS RIGHT  08/25/2020   PARTIAL HYSTERECTOMY   10/22/1999    Still has cervix   RADIOLOGY WITH ANESTHESIA N/A 08/25/2020   Procedure: IR WITH ANESTHESIA EMBOLIZATION;  Surgeon: Pedro Earls, MD;  Location: [facility];  Service: Radiology;  Laterality: N/A;   TRIGGER FINGER RELEASE Left 12/25/2013   Procedure: LEFT THUMB TRIGGER RELEASE ;  Surgeon: Leanora Cover, MD;  Location: [facility];  Service: Orthopedics;  Laterality: Left;    Assessment / Plan / Recommendation Clinical Impression  Betty Archer is a 60 year old right-handed female with history of hypertension, hyperlipidemia, tobacco use as well as history of a 6 mm right A2/ACA aneurysm and an 8 mm left MCA bifurcation aneurysm identified after patient with complaints of headache and right arm numbness in June 2022 followed by neurology services and underwent diagnostic cerebral angiogram and right aneurysmal embolization on  08/25/2020 per interventional radiology and left MCA aneurysm unrepaired with course complicated by right groin hematoma that required prolonged initial intubation was discharged home 08/27/2020 maintained on aspirin and Brilinta..  Per chart review patient lives with her son.  1 level apartment with multiple steps to entry.  Patient had required assistance for mobility since recent discharge she had been taking sponge baths.  Presented 08/31/2020 with altered mental status and lower extremity weakness.  Cranial CT scan negative for acute changes.  CT angiogram of head and neck no emergent large vessel occlusion or high-grade stenosis.  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  Coil mass at the anterior communicating artery without visible residual aneurysmal filling.  MRI of the brain showed confluent right acute ACA territory infarction including involvement of the anterior basal ganglia and ischemia also in the left ACA territory with no associated hemorrhage or mass-effect.  Echocardiogram with ejection fraction of 65 to 01% grade 2 diastolic dysfunction no regional wall motion abnormalities.  Admission chemistries were unremarkable, hemoglobin 9.3, urine drug screen negative.  Neurology follow-up currently maintained on aspirin as well as Brilinta for CVA prophylaxis.  Tolerating a regular consistency diet.  Therapy evaluations completed due to patient's decreased functional ability altered mental status was admitted for a comprehensive rehab program.  Pt presents with moderate cognitive linguistic impairment characterized by deficits in memory, attention, intellectual awareness, and problem solving. She demonstrated flat affect throughout entire evaluation and required cues to expand on details and responses. She required increased time  with processing verbal information and multi step commands. She required max A with problem solving subtests including mental calculation and visual design subtest. Pt was  unable to recall purpose for rehab stay and stated "my legs just gave out." Pt demonstrated decreased judgment/reasoning on 3/3 safety scenarios in judgment subtest. She stated she does not notice any changes in cognition or physical ability since hospitalization. Pt was oriented x4 and was able to recall 3/4 unrelated words after a delay when given a category cue.  She will benefit from skilled SLP intervention to increase independence, safety, and cognitive linguistic skills.    Skilled Therapeutic Interventions          Cognitive linguistic evaluation completed.  Plan of care and goals reviewed with patient and she is in agreement.   SLP Assessment  Patient will need skilled [facility] during CIR admission    Recommendations  Patient destination: Home Follow up Recommendations: Home Health SLP Equipment Recommended: None recommended by SLP    SLP Frequency 3 to 5 out of 7 days   SLP Duration  SLP Intensity  SLP Treatment/Interventions 10-12 days  Minumum of 1-2 x/day, 30 to 90 minutes  Cognitive remediation/compensation;Patient/family education;Therapeutic Activities;Internal/external aids    Pain Pain Assessment Pain Scale: Faces Faces Pain Scale: Hurts a little bit Pain Location: Leg Pain Orientation: Left  Prior Functioning Cognitive/Linguistic Baseline: Within functional limits Type of Home: House  Lives With: Son Available Help at Discharge: Family;Available 24 hours/day  SLP Evaluation Cognition Overall Cognitive Status: Impaired/Different from baseline Arousal/Alertness: Awake/alert Memory: Impaired Memory Impairment: Storage deficit;Retrieval deficit Immediate Memory Recall: Sock;Blue;Bed Memory Recall Sock: Without Cue Memory Recall Blue: Without Cue Memory Recall Bed: With Cue Awareness: Impaired Awareness Impairment: Intellectual impairment Problem Solving: Impaired Problem Solving Impairment: Verbal basic;Functional  basic Behaviors: Perseveration Safety/Judgment: Impaired  Comprehension Auditory Comprehension Overall Auditory Comprehension: Appears within functional limits for tasks assessed Expression Expression Primary Mode of Expression: Verbal Verbal Expression Overall Verbal Expression: Appears within functional limits for tasks assessed Written Expression Dominant Hand: Right     Care Tool Care Tool Cognition Expression of Ideas and Wants Expression of Ideas and Wants: Some difficulty - exhibits some difficulty with expressing needs and ideas (e.g, some words or finishing thoughts) or speech is not clear   Understanding Verbal and Non-Verbal Content Understanding Verbal and Non-Verbal Content: Usually understands - understands most conversations, but misses some part/intent of message. Requires cues at times to understand   Memory/Recall Ability *first 3 days only Memory/Recall Ability *first 3 days only: That he or she is in a hospital/hospital unit;Current season         Short Term Goals: Week 1: SLP Short Term Goal 1 (Week 1): Pt will sustain attention for 6-7 minutes with min A for redirection. SLP Short Term Goal 2 (Week 1): Pt will recall relevant medical information with use of external aids with min A verbal cues. SLP Short Term Goal 3 (Week 1): Pt will complete basic problem solving tasks related to medication management and basic finances with mod A verbal and visual cues. SLP Short Term Goal 4 (Week 1): Pt will demonstrate intellectual awareness of cognitive and physical limitations with mod A verbal and visual cues.  Refer to Care Plan for Long Term Goals  Recommendations for other services: None   Discharge Criteria: Patient will be discharged from SLP if patient refuses treatment 3 consecutive times without medical reason, if treatment goals not met, if there is a change in medical  status, if patient makes no progress towards goals or if patient is discharged from  hospital.  The above assessment, treatment plan, treatment alternatives and goals were discussed and mutually agreed upon: by patient  Lane 09/04/2020, 3:39 PM

## 2020-09-04 NOTE — Plan of Care (Signed)
Problem: RH Balance Goal: LTG: Patient will maintain dynamic sitting balance (OT) Description: LTG:  Patient will maintain dynamic sitting balance with assistance during activities of daily living (OT) Flowsheets (Taken 09/04/2020 1229) LTG: Pt will maintain dynamic sitting balance during ADLs with: Supervision/Verbal cueing Goal: LTG Patient will maintain dynamic standing with ADLs (OT) Description: LTG:  Patient will maintain dynamic standing balance with assist during activities of daily living (OT)  Flowsheets (Taken 09/04/2020 1229) LTG: Pt will maintain dynamic standing balance during ADLs with: Contact Guard/Touching assist   Problem: Sit to Stand Goal: LTG:  Patient will perform sit to stand in prep for activites of daily living with assistance level (OT) Description: LTG:  Patient will perform sit to stand in prep for activites of daily living with assistance level (OT) Flowsheets (Taken 09/04/2020 1229) LTG: PT will perform sit to stand in prep for activites of daily living with assistance level: Contact Guard/Touching assist   Problem: RH Eating Goal: LTG Patient will perform eating w/assist, cues/equip (OT) Description: LTG: Patient will perform eating with assist, with/without cues using equipment (OT) Flowsheets (Taken 09/04/2020 1229) LTG: Pt will perform eating with assistance level of: Independent with assistive device    Problem: RH Grooming Goal: LTG Patient will perform grooming w/assist,cues/equip (OT) Description: LTG: Patient will perform grooming with assist, with/without cues using equipment (OT) Flowsheets (Taken 09/04/2020 1229) LTG: Pt will perform grooming with assistance level of: Supervision/Verbal cueing   Problem: RH Bathing Goal: LTG Patient will bathe all body parts with assist levels (OT) Description: LTG: Patient will bathe all body parts with assist levels (OT) Flowsheets (Taken 09/04/2020 1229) LTG: Pt will perform bathing with assistance  level/cueing: Minimal Assistance - Patient > 75%   Problem: RH Dressing Goal: LTG Patient will perform upper body dressing (OT) Description: LTG Patient will perform upper body dressing with assist, with/without cues (OT). Flowsheets (Taken 09/04/2020 1229) LTG: Pt will perform upper body dressing with assistance level of: Supervision/Verbal cueing Goal: LTG Patient will perform lower body dressing w/assist (OT) Description: LTG: Patient will perform lower body dressing with assist, with/without cues in positioning using equipment (OT) Flowsheets (Taken 09/04/2020 1229) LTG: Pt will perform lower body dressing with assistance level of: Contact Guard/Touching assist   Problem: RH Toileting Goal: LTG Patient will perform toileting task (3/3 steps) with assistance level (OT) Description: LTG: Patient will perform toileting task (3/3 steps) with assistance level (OT)  Flowsheets (Taken 09/04/2020 1229) LTG: Pt will perform toileting task (3/3 steps) with assistance level: Contact Guard/Touching assist   Problem: RH Functional Use of Upper Extremity Goal: LTG Patient will use RT/LT upper extremity as a (OT) Description: LTG: Patient will use right/left upper extremity as a stabilizer/gross assist/diminished/nondominant/dominant level with assist, with/without cues during functional activity (OT) Flowsheets (Taken 09/04/2020 1229) LTG: Use of upper extremity in functional activities: LUE as nondominant level LTG: Pt will use upper extremity in functional activity with assistance level of: Supervision/Verbal cueing   Problem: RH Toilet Transfers Goal: LTG Patient will perform toilet transfers w/assist (OT) Description: LTG: Patient will perform toilet transfers with assist, with/without cues using equipment (OT) Flowsheets (Taken 09/04/2020 1229) LTG: Pt will perform toilet transfers with assistance level of: Contact Guard/Touching assist   Problem: RH Tub/Shower Transfers Goal: LTG Patient will  perform tub/shower transfers w/assist (OT) Description: LTG: Patient will perform tub/shower transfers with assist, with/without cues using equipment (OT) Flowsheets (Taken 09/04/2020 1229) LTG: Pt will perform tub/shower stall transfers with assistance level of: Contact Guard/Touching assist
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
## 2020-09-04 NOTE — Progress Notes (Signed)
PROGRESS NOTE   Subjective/Complaints:  Pt reports "fine"- but kept falling asleep- denies pain; denies dizziness- slept "OK"- LBM yesterday   ROS: Limited by sedation/cognition  Objective:   No results found. Recent Labs    09/02/20 0244 09/03/20 0157  WBC 12.2* 9.9  HGB 8.7* 8.7*  HCT 28.3* 28.2*  PLT 262 255   Recent Labs    09/02/20 0244 09/03/20 0157  NA 135 136  K 3.8 3.6  CL 102 105  CO2 25 24  GLUCOSE 104* 90  BUN 13 11  CREATININE 0.83 0.64  CALCIUM 9.4 9.5    Intake/Output Summary (Last 24 hours) at 09/04/2020 1752 Last data filed at 09/04/2020 1300 Gross per 24 hour  Intake 720 ml  Output --  Net 720 ml        Physical Exam: Vital Signs Blood pressure 120/70, pulse 98, temperature 98.4 F (36.9 C), temperature source Oral, resp. rate 18, height 5\' 6"  (1.676 m), weight 83 kg, SpO2 97 %.   General: awake, but kept falling asleep in middle of a sentence; and dazed/delayed with answering even when awake; PT in room;  NAD HENT: conjugate gaze; oropharynx moist CV: regular rhythm; borderline tachycardia rate; no JVD Pulmonary: CTA B/L; no W/R/R- good air movement GI: soft, NT, ND, (+)BS Psychiatric: appropriate/but very flat Neurological: Sedated; somewhat alert Musculoskeletal:        General: No swelling or tenderness. Normal range of motion.    Cervical back: Neck supple. Skin:    General: Skin is warm.    Coloration: Skin is not jaundiced or pale. Neurological:    Comments: Patient is slow to arouse  No acute distress.  Makes eye contact with examiner.  Provides name and age.  Follows commands.  Fair awareness of deficits. Tracks to left and right. Moves RUE and RLE spontaneously. 3-4/5 LUE and 2-3/5LLE without consistent activation. Sensed pain on left side. No abnormal tone.        Assessment/Plan: 1. Functional deficits which require 3+ hours per day of interdisciplinary therapy  in a comprehensive inpatient rehab setting. Physiatrist is providing close team supervision and 24 hour management of active medical problems listed below. Physiatrist and rehab team continue to assess barriers to discharge/monitor patient progress toward functional and medical goals  Care Tool:  Bathing    Body parts bathed by patient: Right arm, Left arm, Chest, Abdomen, Face, Right upper leg, Left upper leg     Body parts n/a: Front perineal area, Buttocks, Right lower leg, Left lower leg   Bathing assist Assist Level: Moderate Assistance - Patient 50 - 74%     Upper Body Dressing/Undressing Upper body dressing   What is the patient wearing?: Pull over shirt    Upper body assist Assist Level: Moderate Assistance - Patient 50 - 74%    Lower Body Dressing/Undressing Lower body dressing      What is the patient wearing?: Incontinence brief, Pants     Lower body assist Assist for lower body dressing: Dependent - Patient 0% (stedy)     Toileting Toileting    Toileting assist Assist for toileting: Total Assistance - Patient < 25%  Transfers Chair/bed transfer  Transfers assist     Chair/bed transfer assist level: Dependent - mechanical lift (stedy)     Locomotion Ambulation   Ambulation assist              Walk 10 feet activity   Assist           Walk 50 feet activity   Assist           Walk 150 feet activity   Assist           Walk 10 feet on uneven surface  activity   Assist           Wheelchair     Assist               Wheelchair 50 feet with 2 turns activity    Assist            Wheelchair 150 feet activity     Assist          Blood pressure 120/70, pulse 98, temperature 98.4 F (36.9 C), temperature source Oral, resp. rate 18, height 5\' 6"  (1.676 m), weight 83 kg, SpO2 97 %.  Medical Problem List and Plan: 1.  Altered mental status with decreased functional mobility/left lower  extremity weakness secondary to large right ACA and small left ACA infarct likely related to recent ACOM aneurysm post coiling and stenting             -patient may10-1 shower             -ELOS/Goals: 10-14 days, supervision goals with PT, OT, SLP  -first day of evaluations of PT, OT and SLP 2.  Antithrombotics: -DVT/anticoagulation: SCDs             -antiplatelet therapy: Brilinta 90 mg twice daily, aspirin 81 mg daily 3. Pain Management: Lidoderm patch as directed, Tylenol as needed  7/16- denies pain- con't regimen prn 4. Mood: Team to provide emotional support as necessary             -antipsychotic agents: N/A 5. Neuropsych: This patient is? capable of making decisions on her own behalf. 6. Skin/Wound Care: Routine skin checks 7. Fluids/Electrolytes/Nutrition: Routine in and outs with follow-up chemistries             -encourage appropriate PO 8.  Hyperlipidemia.  Lipitor 9.  Hypertension.  Monitor with increased mobility              10.  History of tobacco abuse.  Counseling    11. Poor initiation/sleepy-   7/16- will add Amantadine 100  mg daily. See if that helps?     LOS: 1 days A FACE TO FACE EVALUATION WAS PERFORMED  Jailen Lung 09/04/2020, 5:52 PM

## 2020-09-05 MED ORDER — SENNA 8.6 MG PO TABS
1.0000 | ORAL_TABLET | Freq: Every day | ORAL | Status: DC
  Administered 2020-09-05 – 2020-09-08 (×4): 8.6 mg via ORAL
  Filled 2020-09-05 (×4): qty 1

## 2020-09-05 MED ORDER — SORBITOL 70 % SOLN
30.0000 mL | Freq: Once | Status: AC
  Administered 2020-09-05: 30 mL via ORAL
  Filled 2020-09-05: qty 30

## 2020-09-05 NOTE — Plan of Care (Signed)
  Problem: RH Balance Goal: LTG Patient will maintain dynamic sitting balance (PT) Description: LTG:  Patient will maintain dynamic sitting balance with assistance during mobility activities (PT) Flowsheets (Taken 09/04/2020 0916) LTG: Pt will maintain dynamic sitting balance during mobility activities with:: Supervision/Verbal cueing Goal: LTG Patient will maintain dynamic standing balance (PT) Description: LTG:  Patient will maintain dynamic standing balance with assistance during mobility activities (PT) Flowsheets (Taken 09/04/2020 0916) LTG: Pt will maintain dynamic standing balance during mobility activities with:: Supervision/Verbal cueing   Problem: Sit to Stand Goal: LTG:  Patient will perform sit to stand with assistance level (PT) Description: LTG:  Patient will perform sit to stand with assistance level (PT) Flowsheets (Taken 09/04/2020 0916) LTG: PT will perform sit to stand in preparation for functional mobility with assistance level: Supervision/Verbal cueing   Problem: RH Bed Mobility Goal: LTG Patient will perform bed mobility with assist (PT) Description: LTG: Patient will perform bed mobility with assistance, with/without cues (PT). Flowsheets (Taken 09/04/2020 0916) LTG: Pt will perform bed mobility with assistance level of: Independent with assistive device    Problem: RH Bed to Chair Transfers Goal: LTG Patient will perform bed/chair transfers w/assist (PT) Description: LTG: Patient will perform bed to chair transfers with assistance (PT). Flowsheets (Taken 09/04/2020 0916) LTG: Pt will perform Bed to Chair Transfers with assistance level: Supervision/Verbal cueing   Problem: RH Car Transfers Goal: LTG Patient will perform car transfers with assist (PT) Description: LTG: Patient will perform car transfers with assistance (PT). Flowsheets (Taken 09/04/2020 0916) LTG: Pt will perform car transfers with assist:: Contact Guard/Touching assist   Problem: RH Furniture  Transfers Goal: LTG Patient will perform furniture transfers w/assist (OT/PT) Description: LTG: Patient will perform furniture transfers  with assistance (OT/PT). Flowsheets (Taken 09/04/2020 0916) LTG: Pt will perform furniture transfers with assist:: Contact Guard/Touching assist   Problem: RH Ambulation Goal: LTG Patient will ambulate in controlled environment (PT) Description: LTG: Patient will ambulate in a controlled environment, # of feet with assistance (PT). Flowsheets (Taken 09/04/2020 0916) LTG: Pt will ambulate in controlled environ  assist needed:: Contact Guard/Touching assist LTG: Ambulation distance in controlled environment: 100 feet using LRAD Goal: LTG Patient will ambulate in home environment (PT) Description: LTG: Patient will ambulate in home environment, # of feet with assistance (PT). Flowsheets (Taken 09/04/2020 0916) LTG: Pt will ambulate in home environ  assist needed:: Contact Guard/Touching assist LTG: Ambulation distance in home environment: at least 50 feet using LRAD   Problem: RH Wheelchair Mobility Goal: LTG Patient will propel w/c in controlled environment (PT) Description: LTG: Patient will propel wheelchair in controlled environment, # of feet with assist (PT) Flowsheets (Taken 09/04/2020 0916) LTG: Pt will propel w/c in controlled environ  assist needed:: Supervision/Verbal cueing LTG: Propel w/c distance in controlled environment: up to 100 feet   Problem: RH Stairs Goal: LTG Patient will ambulate up and down stairs w/assist (PT) Description: LTG: Patient will ambulate up and down # of stairs with assistance (PT) Flowsheets (Taken 09/04/2020 0916) LTG: Pt will ambulate up/down stairs assist needed:: Contact Guard/Touching assist LTG: Pt will  ambulate up and down number of stairs: 16 with BHR setup as per home environment

## 2020-09-05 NOTE — Plan of Care (Signed)
  Problem: Consults Goal: RH STROKE PATIENT EDUCATION Description: See Patient Education module for education specifics  Outcome: Progressing   Problem: RH BOWEL ELIMINATION Goal: RH STG MANAGE BOWEL WITH ASSISTANCE Description: STG Manage Bowel with Supervision Assistance. Outcome: Progressing   Problem: RH SKIN INTEGRITY Goal: RH STG MAINTAIN SKIN INTEGRITY WITH ASSISTANCE Description: STG Maintain Skin Integrity With Supervision Assistance. Outcome: Progressing   Problem: RH SAFETY Goal: RH STG ADHERE TO SAFETY PRECAUTIONS W/ASSISTANCE/DEVICE Description: STG Adhere to Safety Precautions With supervision Assistance/Device. Outcome: Progressing Goal: RH STG DECREASED RISK OF FALL WITH ASSISTANCE Description: STG Decreased Risk of Fall With supervision Assistance. Outcome: Progressing   Problem: RH PAIN MANAGEMENT Goal: RH STG PAIN MANAGED AT OR BELOW PT'S PAIN GOAL Description: < 3 on a 0-10 pain scale. Outcome: Progressing   Problem: RH KNOWLEDGE DEFICIT Goal: RH STG INCREASE KNOWLEDGE OF STROKE PROPHYLAXIS Description: Patient will be able to demonstrate knowledge of medications used to prevent future strokes with educational materials and handouts provided by staff independently at discharge. Outcome: Progressing

## 2020-09-05 NOTE — Discharge Instructions (Addendum)
Inpatient Rehab Discharge Instructions  Betty Archer Discharge date and time: 09/24/20   Activities/Precautions/ Functional Status: Activity: As tolerated Diet: Regular Wound Care: Routine skin checks  Functional status:  ___ No restrictions     ___ Walk up steps independently _X__ 24/7 supervision/assistance   ___ Walk up steps with assistance ___ Intermittent supervision/assistance  ___ Bathe/dress independently ___ Walk with walker     __X_ Bathe/dress with assistance ___ Walk Independently    ___ Shower independently ___ Walk with assistance    ___ Shower with assistance _X__ No alcohol     ___ Return to work/school ________   COMMUNITY REFERRALS UPON DISCHARGE:    Outpatient: PT     OT    ST                 Agency: [facility]: [phone]             Appointment Date/Time: *Please expect follow-up within 7-10 business days to schedule your home visit. If you have not received follow-up, be sure to contact the site directly. Be sure to discuss financial assistance.*  Medical Equipment/Items Ordered: rolling walker, shower chair and 3in1 bedside commode (family to get)                                                 Agency/Supplier: N/A  GENERAL COMMUNITY RESOURCES FOR PATIENT/FAMILY: NEW PATIENT/HOSPITAL FOLLOW-UP- [place] [facility] [phone]). Appt scheduled for 9/6 at 1:30pm with Gates Rigg, NP. If unable to keep this appointment, please be sure to call the office!  [facility] [address] [place],  [place]  [address] [phone]  Special Instructions: No driving smoking or alcohol   My questions have been answered and I understand these instructions. I will adhere to these goals and the provided educational materials after my discharge from the hospital.  Patient/Caregiver Signature _______________________________ Date __________  Clinician Signature  _______________________________________ Date __________  Please bring this form and your medication list with you to all your follow-up doctor's appointments.  STROKE/TIA DISCHARGE INSTRUCTIONS SMOKING Cigarette smoking nearly doubles your risk of having a stroke & is the single most alterable risk factor  If you smoke or have smoked in the last 12 months, you are advised to quit smoking for your health. Most of the excess cardiovascular risk related to smoking disappears within a year of stopping. Ask you doctor about anti-smoking medications [place] Quit Line: 1-800-QUIT NOW Free Smoking Cessation Classes [phone]  CHOLESTEROL Know your levels; limit fat & cholesterol in your diet  Lipid Panel     Component Value Date/Time   CHOL 198 09/01/2020 0500   CHOL 268 (H) 03/23/2015 1523   TRIG 82 09/01/2020 0500   HDL 51 09/01/2020 0500   HDL 48 03/23/2015 1523   CHOLHDL 3.9 09/01/2020 0500   VLDL 16 09/01/2020 0500   LDLCALC 131 (H) 09/01/2020 0500   LDLCALC 185 (H) 03/23/2015 1523     Many patients benefit from treatment even if their cholesterol is at goal. Goal: Total Cholesterol (CHOL) less than 160 Goal:  Triglycerides (TRIG) less than 150 Goal:  HDL greater than 40 Goal:  LDL (LDLCALC) less than 100   BLOOD PRESSURE American Stroke Association blood pressure target is less that 120/80 mm/Hg  Your  discharge blood pressure is:  BP: 127/70 Monitor your blood pressure Limit your salt and alcohol intake Many individuals will require more than one medication for high blood pressure  DIABETES (A1c is a blood sugar average for last 3 months) Goal HGBA1c is under 7% (HBGA1c is blood sugar average for last 3 months)  Diabetes: No known diagnosis of diabetes    Lab Results  Component Value Date   HGBA1C 5.3 09/01/2020    Your HGBA1c can be lowered with medications, healthy diet, and exercise. Check your blood sugar as directed by your physician Call your physician if you experience  unexplained or low blood sugars.  PHYSICAL ACTIVITY/REHABILITATION Goal is 30 minutes at least 4 days per week  Activity: Increase activity slowly, Therapies: Physical Therapy: Home Health Return to work:  Activity decreases your risk of heart attack and stroke and makes your heart stronger.  It helps control your weight and blood pressure; helps you relax and can improve your mood. Participate in a regular exercise program. Talk with your doctor about the best form of exercise for you (dancing, walking, swimming, cycling).  DIET/WEIGHT Goal is to maintain a healthy weight  Your discharge diet is:  Diet Order             Diet Heart Room service appropriate? Yes; Fluid consistency: Thin  Diet effective now                   liquids Your height is:  Height: 5\' 6"  (167.6 cm) Your current weight is: Weight: 83 kg Your Body Mass Index (BMI) is:  BMI (Calculated): 29.55 Following the type of diet specifically designed for you will help prevent another stroke. Your goal weight range is:   Your goal Body Mass Index (BMI) is 19-24. Healthy food habits can help reduce 3 risk factors for stroke:  High cholesterol, hypertension, and excess weight.  RESOURCES Stroke/Support Group:  Call [phone]   STROKE EDUCATION PROVIDED/REVIEWED AND GIVEN TO PATIENT Stroke warning signs and symptoms How to activate emergency medical system (call 911). Medications prescribed at discharge. Need for follow-up after discharge. Personal risk factors for stroke. Pneumonia vaccine given: No Flu vaccine given: No My questions have been answered, the writing is legible, and I understand these instructions.  I will adhere to these goals & educational materials that have been provided to me after my discharge from the hospital.

## 2020-09-05 NOTE — Progress Notes (Signed)
PROGRESS NOTE   Subjective/Complaints:  Pt reports she doesn't know when had LBM.  She constantly is squeezing a small ball in R hand, however it appears to be automatic behavior.  Pt admits to feeling a little more awake, however still "not normal".  ROS: Limited by sedation/cognition  Objective:   No results found. Recent Labs    09/03/20 0157  WBC 9.9  HGB 8.7*  HCT 28.2*  PLT 255   Recent Labs    09/03/20 0157  NA 136  K 3.6  CL 105  CO2 24  GLUCOSE 90  BUN 11  CREATININE 0.64  CALCIUM 9.5    Intake/Output Summary (Last 24 hours) at 09/05/2020 1103 Last data filed at 09/05/2020 0743 Gross per 24 hour  Intake 400 ml  Output --  Net 400 ml        Physical Exam: Vital Signs Blood pressure 107/60, pulse 60, temperature 98.4 F (36.9 C), temperature source Oral, resp. rate 20, height 5\' 6"  (1.676 m), weight 83 kg, SpO2 100 %.    General: awake, alert, didn't fall asleep- but has delayed responses and sleepy/lethargic; better than yesterday;- saw at 8:15am NAD HENT: conjugate gaze; oropharynx moist CV: regular rate; no JVD Pulmonary: CTA B/L; no W/R/R- good air movement GI: soft, NT, ND, (+)BS- hypoactive Psychiatric: avery flat; delayed responses Neurological: more alert, but still slowed/delayed; still appears dazed  Musculoskeletal:        General: No swelling or tenderness. Normal range of motion.    Cervical back: Neck supple. Skin:    General: Skin is warm.    Coloration: Skin is not jaundiced or pale. Neurological:    Comments: Patient is slow to arouse  No acute distress.  Makes eye contact with examiner.  Provides name and age.  Follows commands.  Fair awareness of deficits. Tracks to left and right. Moves RUE and RLE spontaneously. 3-4/5 LUE and 2-3/5LLE without consistent activation. Sensed pain on left side. No abnormal tone.        Assessment/Plan: 1. Functional deficits which  require 3+ hours per day of interdisciplinary therapy in a comprehensive inpatient rehab setting. Physiatrist is providing close team supervision and 24 hour management of active medical problems listed below. Physiatrist and rehab team continue to assess barriers to discharge/monitor patient progress toward functional and medical goals  Care Tool:  Bathing    Body parts bathed by patient: Right arm, Left arm, Chest, Abdomen, Face, Right upper leg, Left upper leg     Body parts n/a: Front perineal area, Buttocks, Right lower leg, Left lower leg   Bathing assist Assist Level: Moderate Assistance - Patient 50 - 74%     Upper Body Dressing/Undressing Upper body dressing   What is the patient wearing?: Pull over shirt    Upper body assist Assist Level: Moderate Assistance - Patient 50 - 74%    Lower Body Dressing/Undressing Lower body dressing      What is the patient wearing?: Incontinence brief, Pants     Lower body assist Assist for lower body dressing: Dependent - Patient 0% (stedy)     Toileting Toileting    Toileting assist Assist for toileting: Dependent -  Patient 0%     Transfers Chair/bed transfer  Transfers assist     Chair/bed transfer assist level: Maximal Assistance - Patient 25 - 49%     Locomotion Ambulation   Ambulation assist      Assist level: Maximal Assistance - Patient 25 - 49% Assistive device: Walker-rolling Max distance: 43ft   Walk 10 feet activity   Assist  Walk 10 feet activity did not occur: Safety/medical concerns        Walk 50 feet activity   Assist Walk 50 feet with 2 turns activity did not occur: Safety/medical concerns         Walk 150 feet activity   Assist Walk 150 feet activity did not occur: Safety/medical concerns         Walk 10 feet on uneven surface  activity   Assist Walk 10 feet on uneven surfaces activity did not occur: Safety/medical concerns         Wheelchair     Assist Will  patient use wheelchair at discharge?: Yes Type of Wheelchair: Manual Wheelchair activity did not occur: Safety/medical concerns         Wheelchair 50 feet with 2 turns activity    Assist    Wheelchair 50 feet with 2 turns activity did not occur: Safety/medical concerns       Wheelchair 150 feet activity     Assist  Wheelchair 150 feet activity did not occur: Safety/medical concerns       Blood pressure 107/60, pulse 60, temperature 98.4 F (36.9 C), temperature source Oral, resp. rate 20, height 5\' 6"  (1.676 m), weight 83 kg, SpO2 100 %.  Medical Problem List and Plan: 1.  Altered mental status with decreased functional mobility/left lower extremity weakness secondary to large right ACA and small left ACA infarct likely related to recent ACOM aneurysm post coiling and stenting             -patient may shower             -ELOS/Goals: 10-14 days, supervision goals with PT, OT, SLP  -Continue CIR- PT, OT and SLP 2.  Antithrombotics: -DVT/anticoagulation: SCDs             -antiplatelet therapy: Brilinta 90 mg twice daily, aspirin 81 mg daily 3. Pain Management: Lidoderm patch as directed, Tylenol as needed  7/16- denies pain- con't regimen prn 4. Mood: Team to provide emotional support as necessary             -antipsychotic agents: N/A 5. Neuropsych: This patient is? capable of making decisions on her own behalf. 6. Skin/Wound Care: Routine skin checks 7. Fluids/Electrolytes/Nutrition: Routine in and outs with follow-up chemistries             -encourage appropriate PO 8.  Hyperlipidemia.  Lipitor 9.  Hypertension.  Monitor with increased mobility    7/17- BP a little soft, but denies dizziness- con't regimen         10.  History of tobacco abuse.  Counseling  11. Poor initiation/sleepy-   7/16- will add Amantadine 100  mg daily. See if that helps?  7/17- more awake this AM, but just got first dose 15 minutes prior. Don't think it helped so far. 12.  Constipation 7/17- LBM at least 4_ days ago- will give Sorbitol x1 and add Senokot 1 tab daily to med list.      LOS: 2 days A FACE TO FACE EVALUATION WAS PERFORMED  Betty Archer 09/05/2020, 11:03 AM

## 2020-09-06 DIAGNOSIS — K5901 Slow transit constipation: Secondary | ICD-10-CM

## 2020-09-06 DIAGNOSIS — R5383 Other fatigue: Secondary | ICD-10-CM

## 2020-09-06 LAB — CBC WITH DIFFERENTIAL/PLATELET
Abs Immature Granulocytes: 0.07 10*3/uL (ref 0.00–0.07)
Basophils Absolute: 0.1 10*3/uL (ref 0.0–0.1)
Basophils Relative: 1 %
Eosinophils Absolute: 0.1 10*3/uL (ref 0.0–0.5)
Eosinophils Relative: 1 %
HCT: 30.2 % — ABNORMAL LOW (ref 36.0–46.0)
Hemoglobin: 9.4 g/dL — ABNORMAL LOW (ref 12.0–15.0)
Immature Granulocytes: 1 %
Lymphocytes Relative: 22 %
Lymphs Abs: 2.3 10*3/uL (ref 0.7–4.0)
MCH: 24.6 pg — ABNORMAL LOW (ref 26.0–34.0)
MCHC: 31.1 g/dL (ref 30.0–36.0)
MCV: 79.1 fL — ABNORMAL LOW (ref 80.0–100.0)
Monocytes Absolute: 0.6 10*3/uL (ref 0.1–1.0)
Monocytes Relative: 6 %
Neutro Abs: 7.1 10*3/uL (ref 1.7–7.7)
Neutrophils Relative %: 69 %
Platelets: 306 10*3/uL (ref 150–400)
RBC: 3.82 MIL/uL — ABNORMAL LOW (ref 3.87–5.11)
RDW: 16.7 % — ABNORMAL HIGH (ref 11.5–15.5)
WBC: 10.2 10*3/uL (ref 4.0–10.5)
nRBC: 0 % (ref 0.0–0.2)

## 2020-09-06 LAB — COMPREHENSIVE METABOLIC PANEL
ALT: 23 U/L (ref 0–44)
AST: 23 U/L (ref 15–41)
Albumin: 3.3 g/dL — ABNORMAL LOW (ref 3.5–5.0)
Alkaline Phosphatase: 68 U/L (ref 38–126)
Anion gap: 9 (ref 5–15)
BUN: 11 mg/dL (ref 6–20)
CO2: 25 mmol/L (ref 22–32)
Calcium: 9.3 mg/dL (ref 8.9–10.3)
Chloride: 104 mmol/L (ref 98–111)
Creatinine, Ser: 0.71 mg/dL (ref 0.44–1.00)
GFR, Estimated: >60 mL/min (ref >60)
Glucose, Bld: 89 mg/dL (ref 70–99)
Potassium: 4.1 mmol/L (ref 3.5–5.1)
Sodium: 138 mmol/L (ref 135–145)
Total Bilirubin: 0.7 mg/dL (ref 0.3–1.2)
Total Protein: 6.8 g/dL (ref 6.5–8.1)

## 2020-09-06 MED ORDER — METHYLPHENIDATE HCL 5 MG PO TABS
5.0000 mg | ORAL_TABLET | Freq: Two times a day (BID) | ORAL | Status: DC
  Administered 2020-09-07 – 2020-09-09 (×5): 5 mg via ORAL
  Filled 2020-09-06 (×5): qty 1

## 2020-09-06 NOTE — Progress Notes (Signed)
PROGRESS NOTE   Subjective/Complaints:  Still a little sedated in appearance. Says she was able to sleep last night  ROS: Limited due to cognitive/behavioral     Objective:   No results found. Recent Labs    09/06/20 0725  WBC 10.2  HGB 9.4*  HCT 30.2*  PLT 306   Recent Labs    09/06/20 0725  NA 138  K 4.1  CL 104  CO2 25  GLUCOSE 89  BUN 11  CREATININE 0.71  CALCIUM 9.3    Intake/Output Summary (Last 24 hours) at 09/06/2020 1240 Last data filed at 09/06/2020 0858 Gross per 24 hour  Intake 600 ml  Output --  Net 600 ml        Physical Exam: Vital Signs Blood pressure 127/70, pulse 79, temperature 98.4 F (36.9 C), temperature source Oral, resp. rate 17, height 5\' 6"  (1.676 m), weight 83 kg, SpO2 100 %.    Constitutional: No distress . Vital signs reviewed. HEENT: EOMI, oral membranes moist Neck: supple Cardiovascular: RRR without murmur. No JVD    Respiratory/Chest: CTA Bilaterally without wheezes or rales. Normal effort    GI/Abdomen: BS +, non-tender, non-distended Ext: no clubbing, cyanosis, or edema Psych: pleasant and cooperative  Musculoskeletal:        General: No swelling or tenderness. Normal range of motion.    Cervical back: Neck supple. Skin:      Skin is warm.    Coloration: Skin is not jaundiced or pale. Neurological:    Comments: lethargic, delayed.  Makes eye contact with examiner.  Provides name and age.  Follows commands.  Fair awareness of deficits. Tracks to left and right. Moves RUE and RLE spontaneously. 3-4/5 LUE and 2-3/5LLE still without consistent activation. Sensed pain on left side. No abnormal tone.        Assessment/Plan: 1. Functional deficits which require 3+ hours per day of interdisciplinary therapy in a comprehensive inpatient rehab setting. Physiatrist is providing close team supervision and 24 hour management of active medical problems listed  below. Physiatrist and rehab team continue to assess barriers to discharge/monitor patient progress toward functional and medical goals  Care Tool:  Bathing    Body parts bathed by patient: Right arm, Left arm, Chest, Abdomen, Face, Right upper leg, Left upper leg, Front perineal area, Buttocks, Right lower leg, Left lower leg     Body parts n/a: Front perineal area, Buttocks, Right lower leg, Left lower leg   Bathing assist Assist Level: Minimal Assistance - Patient > 75%     Upper Body Dressing/Undressing Upper body dressing   What is the patient wearing?: Pull over shirt    Upper body assist Assist Level: Contact Guard/Touching assist    Lower Body Dressing/Undressing Lower body dressing      What is the patient wearing?: Incontinence brief     Lower body assist Assist for lower body dressing: Moderate Assistance - Patient 50 - 74%     Toileting Toileting    Toileting assist Assist for toileting: Minimal Assistance - Patient > 75%     Transfers Chair/bed transfer  Transfers assist     Chair/bed transfer assist level: Minimal Assistance - Patient >  75%     Locomotion Ambulation   Ambulation assist      Assist level: Minimal Assistance - Patient > 75% Assistive device: Hand held assist Max distance: >200 ft   Walk 10 feet activity   Assist  Walk 10 feet activity did not occur: Safety/medical concerns  Assist level: Minimal Assistance - Patient > 75% Assistive device: Hand held assist   Walk 50 feet activity   Assist Walk 50 feet with 2 turns activity did not occur: Safety/medical concerns  Assist level: Minimal Assistance - Patient > 75% Assistive device: Hand held assist    Walk 150 feet activity   Assist Walk 150 feet activity did not occur: Safety/medical concerns  Assist level: Minimal Assistance - Patient > 75% Assistive device: Hand held assist    Walk 10 feet on uneven surface  activity   Assist Walk 10 feet on uneven  surfaces activity did not occur: Safety/medical concerns         Wheelchair     Assist Will patient use wheelchair at discharge?: Yes Type of Wheelchair: Manual Wheelchair activity did not occur: Safety/medical concerns         Wheelchair 50 feet with 2 turns activity    Assist    Wheelchair 50 feet with 2 turns activity did not occur: Safety/medical concerns       Wheelchair 150 feet activity     Assist  Wheelchair 150 feet activity did not occur: Safety/medical concerns       Blood pressure 127/70, pulse 79, temperature 98.4 F (36.9 C), temperature source Oral, resp. rate 17, height 5\' 6"  (1.676 m), weight 83 kg, SpO2 100 %.  Medical Problem List and Plan: 1.  Altered mental status with decreased functional mobility/left lower extremity weakness secondary to large right ACA and small left ACA infarct likely related to recent ACOM aneurysm post coiling and stenting             -patient may shower             -ELOS/Goals: 10-14 days, supervision goals with PT, OT, SLP  -Continue CIR therapies including PT, OT, and SLP  2.  Antithrombotics: -DVT/anticoagulation: SCDs             -antiplatelet therapy: Brilinta 90 mg twice daily, aspirin 81 mg daily 3. Pain Management: Lidoderm patch as directed, Tylenol as needed  7/18- denies pain- con't regimen prn 4. Mood: Team to provide emotional support as necessary             -antipsychotic agents: N/A 5. Neuropsych: This patient is? capable of making decisions on her own behalf. 6. Skin/Wound Care: Routine skin checks 7. Fluids/Electrolytes/Nutrition: Routine in and outs with follow-up chemistries             -encourage appropriate PO  I personally reviewed the patient's labs today.   8.  Hyperlipidemia.  Lipitor 9.  Hypertension.  Monitor with increased mobility    7/18- BP controlled         10.  History of tobacco abuse.  Counseling  11. Poor initiation/sleepy-   7/18 amantadine with  middling results.  Will try ritalin instead. 12. Constipation 7/18-  had bm Sorbitol x1   -Senokot 1 tab daily for daily regimen      LOS: 3 days A FACE TO FACE EVALUATION WAS PERFORMED  [name] 09/06/2020, 12:40 PM

## 2020-09-06 NOTE — Progress Notes (Signed)
Speech Language Pathology Daily Session Note  Patient Details  Name: Betty Archer MRN: [id number] Date of Birth: 1960/06/08  Today's Date: 09/06/2020 SLP Individual Time: 1230-1255 SLP Individual Time Calculation (min): 25 min and Today's Date: 09/06/2020 SLP Missed Time: 20 Minutes Missed Time Reason: Fatigue  Short Term Goals: Week 1: SLP Short Term Goal 1 (Week 1): Pt will sustain attention for 6-7 minutes with min A for redirection. SLP Short Term Goal 2 (Week 1): Pt will recall relevant medical information with use of external aids with min A verbal cues. SLP Short Term Goal 3 (Week 1): Pt will complete basic problem solving tasks related to medication management and basic finances with mod A verbal and visual cues. SLP Short Term Goal 4 (Week 1): Pt will demonstrate intellectual awareness of cognitive and physical limitations with mod A verbal and visual cues.  Skilled Therapeutic Interventions: Skilled treatment session focused on cognitive goals. This session was the 2nd attempt at seeing the patient due to fatigue with decreased arousal. SLP facilitated session by administering the [place] (SLUMS). Patient scored  16/30 points with a score of 27 or above considered normal. Patient demonstrated deficits in attention and recall. Throughout session, patient with minimal social engagement with a flat affect with decreased awareness of deficits. Patient left upright in recliner with alarm on and all needs within reach. Continue with current plan of care.       Pain No/Denies Pain   Therapy/Group: Individual Therapy  Tanaja Ganger 09/06/2020, 3:13 PM

## 2020-09-06 NOTE — Care Management (Signed)
[facility] Individual Statement of Services  Patient Name:  Betty Archer  Date:  09/06/2020  Welcome to the [facility].  Our goal is to provide you with an individualized program based on your diagnosis and situation, designed to meet your specific needs.  With this comprehensive rehabilitation program, you will be expected to participate in at least 3 hours of rehabilitation therapies Monday-Friday, with modified therapy programming on the weekends.  Your rehabilitation program will include the following services:  Physical Therapy (PT), Occupational Therapy (OT), Speech Therapy (ST), 24 hour per day rehabilitation nursing, Therapeutic Recreaction (TR), Psychology, Neuropsychology, Care Coordinator, Rehabilitation Medicine, [place], and Other  Weekly team conferences will be held on Tuesdays to discuss your progress.  Your Inpatient Rehabilitation Care Coordinator will talk with you frequently to get your input and to update you on team discussions.  Team conferences with you and your family in attendance may also be held.  Expected length of stay: 16-18 days    Overall anticipated outcome: Supervision to Contact Guard  Depending on your progress and recovery, your program may change. Your Inpatient Rehabilitation Care Coordinator will coordinate services and will keep you informed of any changes. Your Inpatient Rehabilitation Care Coordinator's name and contact numbers are listed  below.  The following services may also be recommended but are not provided by the [facility] will be made to provide these services after discharge if needed.  Arrangements include referral to agencies that provide these services.  Your insurance has been verified to be:   Uninsured  Your primary doctor is:  No PCP  Pertinent information will be shared with your doctor and your insurance company.  Inpatient Rehabilitation Care Coordinator:  Cathleen Corti [phone] or ([phone]  Information discussed with and copy given to patient by: Rana Snare, 09/06/2020, 11:04 AM

## 2020-09-06 NOTE — Progress Notes (Signed)
Patient and family educated to the nuber of visitors allowed each day. Patient family still insist on coming with 3-4 visitors per day. Family members were also educated to the need to where mask in the room with patient at all times but continue to ask why and pull down mask. Patient family will continue to be educated and front desk notified. Sanda Linger, LPN

## 2020-09-06 NOTE — IPOC Note (Signed)
Overall Plan of Care [facility]) Patient Details Name: Betty Archer MRN: [id number] DOB: 03-21-1960  Admitting Diagnosis: Acute ischemic cerebrovascular accident (CVA) involving anterior cerebral artery territory [facility])  Hospital Problems: Principal Problem:   Acute ischemic cerebrovascular accident (CVA) involving anterior cerebral artery territory [facility])     Functional Problem List: Nursing Bowel, Endurance, Medication Management, Safety, Pain, Motor  PT Balance, Behavior, Endurance, Motor, Safety  OT Balance, Motor, Pain, Cognition, Behavior, Endurance, Safety, Perception, Vision, Skin Integrity  SLP Cognition  TR         Basic ADL's: OT Eating, Grooming, Bathing, Dressing, Toileting     Advanced  ADL's: OT       Transfers: PT Bed Mobility, Bed to Chair, Car, Manufacturing systems engineer, Metallurgist: PT Ambulation, Emergency planning/management officer, Stairs     Additional Impairments: OT Fuctional Use of Upper Extremity  SLP Social Cognition      TR      Anticipated Outcomes Item Anticipated Outcome  Self Feeding S  Swallowing      Basic self-care  S  Toileting  S   Bathroom Transfers CGA  Bowel/Bladder  Supervision assist with bowels  Transfers  supervision/ CGA  Locomotion  supervision/ CGA  Communication     Cognition  mod I  Pain  < 3  Safety/Judgment  supervision assist and no falls   Therapy Plan: PT Intensity: Minimum of 1-2 x/day ,45 to 90 minutes PT Frequency: 5 out of 7 days PT Duration Estimated Length of Stay: 2-3 weeks OT Intensity: Minimum of 1-2 x/day, 45 to 90 minutes OT Frequency: 5 out of 7 days OT Duration/Estimated Length of Stay: 16 to 18 days SLP Intensity: Minumum of 1-2 x/day, 30 to 90 minutes SLP Frequency: 3 to 5 out of 7 days SLP Duration/Estimated Length of Stay: 10-12 days   Due to the current state of emergency, patients may not be receiving their 3-hours of Medicare-mandated therapy.   Team Interventions: Nursing  Interventions Patient/Family Education, Bowel Management, Disease Management/Prevention, Pain Management, Medication Management, Psychosocial Support, Discharge Planning  PT interventions Ambulation/gait training, Balance/vestibular training, Cognitive remediation/compensation, Community reintegration, Discharge planning, Disease management/prevention, DME/adaptive equipment instruction, Functional electrical stimulation, Functional mobility training, Neuromuscular re-education, Pain management, Patient/family education, Psychosocial support, Splinting/orthotics, Stair training, Therapeutic Activities, Therapeutic Exercise, UE/LE Strength taining/ROM, UE/LE Coordination activities, Visual/perceptual remediation/compensation, Wheelchair propulsion/positioning  OT Interventions Training and development officer, Community reintegration, Disease mangement/prevention, Neuromuscular re-education, Barrister's clerk education, Self Care/advanced ADL retraining, Splinting/orthotics, Therapeutic Exercise, UE/LE Coordination activities, Wheelchair propulsion/positioning, Visual/perceptual remediation/compensation, UE/LE Strength taining/ROM, Therapeutic Activities, Skin care/wound managment, Psychosocial support, Pain management, Functional mobility training, DME/adaptive equipment instruction, Discharge planning, Cognitive remediation/compensation, Functional electrical stimulation  SLP Interventions Cognitive remediation/compensation, Patient/family education, Therapeutic Activities, Internal/external aids  TR Interventions    SW/CM Interventions     Barriers to Discharge MD  Medical stability  Nursing Decreased caregiver support, Home environment access/layout, Lack of/limited family support, Weight, Medication compliance, Behavior 1 level apartment home on 2nd level with 15+ steps to enter, right hand rail. Lives with 2 of 3 sons. Son [place] plans to take [employer]. Sons will be available 24/7.  PT Inaccessible home  environment, Decreased caregiver support, Lack of/limited family support, Home environment access/layout, Incontinence, Insurance for SNF coverage, Medication compliance, Behavior, Nutrition means cognitive deficits on eval, complete flight of steps to enter home, unsure of 24hr sup on d/c  OT Inaccessible home environment 2nd floor apartment  SLP      SW  Team Discharge Planning: Destination: PT-Home ,OT- Home , SLP-Home Projected Follow-up: PT-Home health PT, 24 hour supervision/assistance, OT-  Home health OT, SLP-Home Health SLP Projected Equipment Needs: PT-To be determined, OT- To be determined, SLP-None recommended by SLP Equipment Details: PT- , OT-  Patient/family involved in discharge planning: PT- Patient,  OT-Family member/caregiver, SLP-Patient  MD ELOS: 14-`17 weeks Medical Rehab Prognosis:  Excellent Assessment: The patient has been admitted for CIR therapies with the diagnosis of 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 The team will be addressing functional mobility, strength, stamina, balance, safety, adaptive techniques and equipment, self-care, bowel and bladder mgt, patient and caregiver education, NMR, arousal, cognition, community reentry. Goals have been set at supervision for self-care and mobility and mod I for cognition.   Due to the current state of emergency, patients may not be receiving their 3 hours per day of Medicare-mandated therapy.    [name], MD, FAAPMR     See Team Conference Notes for weekly updates to the plan of care

## 2020-09-06 NOTE — Progress Notes (Signed)
Occupational Therapy Session Note  Patient Details  Name: Betty Archer MRN: [id number] Date of Birth: 26-May-1960  Today's Date: 09/06/2020 OT Individual Time: 0940-7680 OT Individual Time Calculation (min): 55 min   Session 2: OT Individual Time: 8811-0315 OT Individual Time Calculation (min): 45 min    Short Term Goals: Week 1:  OT Short Term Goal 1 (Week 1): Pt will complete STS in prep for standing ADL with consistent mod A in 2/3 trials. OT Short Term Goal 2 (Week 1): Pt will don pants with mod A + AE PRN. OT Short Term Goal 3 (Week 1): Pt will req no more than min VCs to terminate grooming task. OT Short Term Goal 4 (Week 1): Pt will maintain attention to task for at least 3 min with no more than min VCs.  Skilled Therapeutic Interventions/Progress Updates:    Pt received supine with 5/10 in her abdomen, reporting it is from constipation. Pt agreeable to OT session with focus on shower level ADLs. Pt completed bed mobility with min A to EOB. She completed sit > stand in the stedy with CGA. She was transferred to [facility] over toilet with stedy and she was able to void BM. Pt heavily incontinent of urine and small BM in brief. She requires mod cueing for initiation and often sequencing at times. She was able to complete peri hygiene with min guard, leaning forward on BSC with slight squat. Pt completed transfer into shower via stedy. UB bathing with set up assist, min A for LB bathing sit <> stand. Occasional abrupt lowering in standing but overall stable. Cueing for initiation required. Pt transferred back to w/c via stedy. She required cueing for initiation of grooming tasks at the sink. She donned shirt with CGA seated. Mod A to don incontinence brief, fastened like pants. Pt completed a stand pivot transfer back to bed with min A using bed rail. Min A to bring BLE into bed. Pt was left supine with all needs met, bed alarm set.   Session 2: Pt received sitting in the recliner with her son  and sister present, no c/o pain. Pt completed sit > stand with min a and then mod A to pivot to the w/c. From EOM pt completed activities focused on dynamic standing balance, challenging UE support and BOS. Min-mod A overall with graded challenges. Challenges in sustained attention to task, requiring cueing for maintaining pattern d/t being distracted from others in the gym. Odor of BM became obvious and pt was taken back to her room. She required min A and mod cueing for stand pivot transfer to the [facility]. Mod A for toileting tasks. Difficulty initiating motor plan of peri hygiene- likely combination of both attention and motor planning deficits. Pt unable to void BM any further but was slightly incontinent of urine and BM. Pt was left sitting up in the recliner with her son and sister still present. Discussed conference tomorrow with them both. Chair alarm belt fastened.    Therapy Documentation Precautions:  Precautions Precautions: Fall Precaution Comments: L hemi with LLE>LUE, cognitive deficits, falls asleep easily Restrictions Weight Bearing Restrictions: No   Therapy/Group: Individual Therapy  Curtis Sites 09/06/2020, 5:59 AM

## 2020-09-06 NOTE — Progress Notes (Signed)
Inpatient Rehabilitation  Patient information reviewed and entered into eRehab system by Victorino Fatzinger Louden Houseworth, OTR/L.   Information including medical coding, functional ability and quality indicators will be reviewed and updated through discharge.    

## 2020-09-06 NOTE — Plan of Care (Signed)
  Problem: Consults Goal: RH STROKE PATIENT EDUCATION Description: See Patient Education module for education specifics  Outcome: Progressing   Problem: RH BOWEL ELIMINATION Goal: RH STG MANAGE BOWEL WITH ASSISTANCE Description: STG Manage Bowel with Supervision Assistance. Outcome: Progressing   Problem: RH SKIN INTEGRITY Goal: RH STG MAINTAIN SKIN INTEGRITY WITH ASSISTANCE Description: STG Maintain Skin Integrity With Supervision Assistance. Outcome: Progressing   Problem: RH SAFETY Goal: RH STG ADHERE TO SAFETY PRECAUTIONS W/ASSISTANCE/DEVICE Description: STG Adhere to Safety Precautions With supervision Assistance/Device. Outcome: Progressing Goal: RH STG DECREASED RISK OF FALL WITH ASSISTANCE Description: STG Decreased Risk of Fall With supervision Assistance. Outcome: Progressing   Problem: RH PAIN MANAGEMENT Goal: RH STG PAIN MANAGED AT OR BELOW PT'S PAIN GOAL Description: < 3 on a 0-10 pain scale. Outcome: Progressing   Problem: RH KNOWLEDGE DEFICIT Goal: RH STG INCREASE KNOWLEDGE OF STROKE PROPHYLAXIS Description: Patient will be able to demonstrate knowledge of medications used to prevent future strokes with educational materials and handouts provided by staff independently at discharge. Outcome: Progressing

## 2020-09-06 NOTE — Progress Notes (Signed)
Physical Therapy Session Note  Patient Details  Name: Betty Archer MRN: [id number] Date of Birth: 10/16/1960  Today's Date: 09/06/2020 PT Individual Time: 1105-1205 PT Individual Time Calculation (min): 60 min   Short Term Goals: Week 1:  PT Short Term Goal 1 (Week 1): Pt will performbed mobility with Min A. PT Short Term Goal 2 (Week 1): Pt will perform STS transfers with overall Mod A. PT Short Term Goal 3 (Week 1): Pt will perform seat-to-seat transfers with overall Mod A. PT Short Term Goal 4 (Week 1): Pt will initiate gait training and complete 25 feet with Mod A using LRAD. PT Short Term Goal 5 (Week 1): Pt will initiate w/c mobility training.  Skilled Therapeutic Interventions/Progress Updates:     Patient in bed on the phone upon PT arrival. Patient alert and agreeable to PT session. Patient denied pain during session.   Patient minimally verbal during session, did begin to respond with one word answers to questions throughout session. Patient presents with increased external distraction and delayed processing with functional tasks.  Therapeutic Activity: Bed Mobility: Patient donned pants bed level for time/energy management with mod A, performed bridging x2 to pull pants over her hips independently. She performed supine to sit with min A for trunk support. Provided verbal cues for rolling on her side to bring her trunk forward to sit up. Patient sat EOB with supervision for sitting balance >1 min. Transfers: Patient performed stand pivot bed>w/c and sit to/from stand x3 with min A HHA. Provided verbal cues for scooting forward, forward weight shift, and increased hip/knee/trunk extension to come to standing.  Gait Training:  Patient ambulated 110 feet, 200 feet in 3 min and 17 sec during attempted 6 Min Walk Test, however, she became distracted by the chair and returned to sitting during the test, and 350 feet using HHA with min A and mod A with increased fatigue. Ambulated with  decreased L stance time, step height, step length, increased L knee flexion with fatigue, very slow gait speed and increased external distraction throughout. Provided mod-max verbal cues for attention to task, patient stopped at each sign on the wall and would stop to look at other people or obstacles in the hallway. Also provided cues for leading with her L heel at initial contact for increased step height and length, and increased knee extension in stance.  Patient in recliner at end of session with breaks locked, seat belt alarm set, and all needs within reach.   Therapy Documentation Precautions:  Precautions Precautions: Fall Precaution Comments: L hemi with LLE>LUE, cognitive deficits, falls asleep easily Restrictions Weight Bearing Restrictions: No    Therapy/Group: Individual Therapy  Betty Archer PT, DPT  09/06/2020, 12:37 PM

## 2020-09-07 NOTE — Progress Notes (Signed)
Inpatient Rehabilitation Care Coordinator Assessment and Plan Patient Details  Name: Betty Archer MRN: [id number] Date of Birth: 1960/10/29  Today's Date: 09/07/2020  Hospital Problems: Principal Problem:   Acute ischemic cerebrovascular accident (CVA) involving anterior cerebral artery territory [facility])  Past Medical History:  Past Medical History:  Diagnosis Date   Hyperlipidemia    Past Surgical History:  Past Surgical History:  Procedure Laterality Date   BREAST BIOPSY      Core biopsy done on April 2003   COLONOSCOPY     IR 3D INDEPENDENT WKST  08/25/2020   IR 3D INDEPENDENT WKST  08/25/2020   IR ANGIO INTRA EXTRACRAN SEL INTERNAL CAROTID BILAT MOD SED  08/25/2020   IR ANGIO VERTEBRAL SEL VERTEBRAL BILAT MOD SED  08/25/2020   IR CT HEAD LTD  08/25/2020   IR INTRA CRAN STENT  08/25/2020   IR TRANSCATH/EMBOLIZ  08/25/2020   IR US GUIDE VASC ACCESS RIGHT  08/25/2020   PARTIAL HYSTERECTOMY   10/22/1999    Still has cervix   RADIOLOGY WITH ANESTHESIA N/A 08/25/2020   Procedure: IR WITH ANESTHESIA EMBOLIZATION;  Surgeon: Pedro Earls, MD;  Location: [place];  Service: Radiology;  Laterality: N/A;   TRIGGER FINGER RELEASE Left 12/25/2013   Procedure: LEFT THUMB TRIGGER RELEASE ;  Surgeon: Leanora Cover, MD;  Location: [place];  Service: Orthopedics;  Laterality: Left;   Social History:  reports that she has been smoking cigarettes. She has a 13.50 pack-year smoking history. She has never used smokeless tobacco. She reports current alcohol use. She reports that she does not use drugs.  Family / Support Systems Marital Status: Single Patient Roles: Parent Spouse/Significant Other: N/A Children: 3 adult sons: Betty Archer, Betty Archer, and Betty Archer. Primary contact for admission is Betty Archer. Other Supports: various family members Anticipated Caregiver: Betty Archer (son) as he lives in the home with pt. Ability/Limitations of Caregiver: Betty Archer works [employer]. States otehr family will step  in while he is at work. Caregiver Availability: 24/7 Family Dynamics: Pt and son Betty Archer live in the same home.  Social History Preferred language: English Religion: Baptist Cultural Background: Pt worked at [employer] up until her first medical procedure in June 2022 Education: high school Read: Yes Write: Yes Employment Status: Unemployed Date Retired/Disabled/Unemployed: Pt was working at [employer] through [employer] until first medical procedure- June 2022 Legal History/Current Legal Issues: Denies Guardian/Conservator: N/A   Abuse/Neglect Abuse/Neglect Assessment Can Be Completed: Yes Physical Abuse: Denies Verbal Abuse: Denies Sexual Abuse: Denies Exploitation of patient/patient's resources: Denies Self-Neglect: Denies  Emotional Status Pt's affect, behavior and adjustment status: Pt appeared to be in good spirits at time of visit. Pt slightly distracted as she was coloring in coloring book during visit, however, was able to continue to engage in conversation with SW.Some mild confusion at times when answering complex questions, but son was able ot help clarify (i.e. employment, and how often she smokes cigarettes). Recent Psychosocial Issues: Denies Psychiatric History: Denies Substance Abuse History: Pt admits that she was smoking up to 5 cigarettes per day until first medical procedure in June 2022. Pt has not had a cigarette in 3-4 weeks.  Patient / Family Perceptions, Expectations & Goals Pt/Family understanding of illness & functional limitations: Patient's family has a general understanding of pt care needs Premorbid pt/family roles/activities: Independent Anticipated changes in roles/activities/participation: Assistance with ADLs/IADLs Pt/family expectations/goals: Pt was unable to tell SW her goal. Pt family would like for patient to be as independent  as possible (walking, strength, speech).  Community Resources Express Scripts:  None Premorbid Home Care/DME Agencies: None Transportation available at discharge: Family Resource referrals recommended: Neuropsychology  Discharge Planning Living Arrangements: Children Support Systems: Children, Other relatives Type of Residence: Private residence Insurance Resources: Self-pay (Pt son Betty Archer states they have submitted Medicaid application and are working on completing all requested information.) Pensions consultant: Employment, Secondary school teacher Screen Referred: Yes (First source already involved) Living Expenses: Rent Money Management: Patient, Family Does the patient have any problems obtaining your medications?: No Home Management: Pt managed all home care needs Patient/Family Preliminary Plans: TBD Care Coordinator Barriers to Discharge: Home environment access/layout, Other (comments) Care Coordinator Barriers to Discharge Comments: Pt is uninsured. Pt lives on second floor apartment and there are 10-15 steps to unit. Care Coordinator Anticipated Follow Up Needs: HH/OP Expected length of stay: 16-18 days  Clinical Impression SW met with pt, pt son Betty Archer, and sister in room to introduce self, explain role, and discuss discharge process. Pt has no PCP but would like SW to establish. Pt is not a English as a second language teacher. Pt has no HCPOA. Pt has no DME. Family aware SW spoke with [facility] this morning. SW explained charity DME, HH, and MATCH. SW explained process for outpatient therapies if suggested and must establish payment arrangement prior to services. SW encouraged family to look for general DME such as w/c, RW, TTB, and 3in1 BSC. SW informed will confirm what DME is recommended closer towards d/c. SW also informed family edu will be discussed closer towards d/c as well.   1105am-SW spoke with pt son Betty Archer to introduce self, explain role, and discuss discharge process. SW informed on ELOS, and SW will f/u tomorrow after team conference to give updates. Betty Archer confirms [place]  application has been submitted.   Betty Archer 09/07/2020, 8:07 AM

## 2020-09-07 NOTE — Progress Notes (Signed)
Physical Therapy Session Note  Patient Details  Name: Betty Archer MRN: [id number] Date of Birth: 1960/11/05  Today's Date: 09/07/2020 PT Individual Time: 0803-0859 PT Individual Time Calculation (min): 56 min   Short Term Goals: Week 1:  PT Short Term Goal 1 (Week 1): Pt will performbed mobility with Min A. PT Short Term Goal 2 (Week 1): Pt will perform STS transfers with overall Mod A. PT Short Term Goal 3 (Week 1): Pt will perform seat-to-seat transfers with overall Mod A. PT Short Term Goal 4 (Week 1): Pt will initiate gait training and complete 25 feet with Mod A using LRAD. PT Short Term Goal 5 (Week 1): Pt will initiate w/c mobility training.  Skilled Therapeutic Interventions/Progress Updates:     Pt received supine in bed and agrees to therapy. No complaint of pain. PT threads pants on both legs and pt assisted in bridging position to pull up pants. Supine to sit with verbal cues on sequencing and with use of bed features. Stand pivot transfer to [employer] [place] with minA. WC transport to gym for time management. Pt performs stand pivot to mat table with minA. PT then notes that pt has been incontinent of urine. Pt ambulates x120' back to room with modA and consistent verbal cues to increase L step height and length. Pt transfers to toilet and PT provides totalA to doff pants and soiled brief. Pt able to use washcloths for anterior and posterior pericare with PT providing minA when pt performs partial stands during self hygiene. PT places clean brief around pt's thighs and pt requires modA to pull up brief and new pants. Pt then ambulates back to gym, 120', with modA. Pt performs Berg balance assessment, as detailed below, indicating she is at high risk for falls. Pt consistently demonstrates posterior bias and loses balance backward multiple times during test. Mirror positioned for visual feedback, though pt becomes distracted very easily, often losing balance backward when not cued to look at self  in mirror. Pt gradually improves balance with consistent multimodal cueing. Multiple seated rest breaks taken throughout. Pt performs stand step transfer from mat>WC>recliner with minA. Left seated with alarm intact and all needs within reach.   Therapy Documentation Precautions:  Precautions Precautions: Fall Precaution Comments: L hemi with LLE>LUE, cognitive deficits, falls asleep easily Restrictions Weight Bearing Restrictions: No Balance: Balance Balance Assessed: Yes Standardized Balance Assessment Standardized Balance Assessment: Berg Balance Test Berg Balance Test Sit to Stand: Needs minimal aid to stand or to stabilize Standing Unsupported: Needs several tries to stand 30 seconds unsupported Sitting with Back Unsupported but Feet Supported on Floor or Stool: Able to sit 2 minutes under supervision Stand to Sit: Uses backs of legs against chair to control descent Transfers: Needs one person to assist Standing Unsupported with Eyes Closed: Able to stand 10 seconds with supervision Standing Ubsupported with Feet Together: Needs help to attain position and unable to hold for 15 seconds From Standing, Reach Forward with Outstretched Arm: Reaches forward but needs supervision From Standing Position, Pick up Object from Floor: Unable to try/needs assist to keep balance From Standing Position, Turn to Look Behind Over each Shoulder: Needs supervision when turning Turn 360 Degrees: Needs assistance while turning Standing Unsupported, Alternately Place Feet on Step/Stool: Needs assistance to keep from falling or unable to try Standing Unsupported, One Foot in Front: Loses balance while stepping or standing Standing on One Leg: Unable to try or needs assist to prevent fall Total Score: 13   Therapy/Group: Individual  Therapy  Breck Coons, PT, DPT 09/07/2020, 4:09 PM

## 2020-09-07 NOTE — Progress Notes (Signed)
Occupational Therapy Session Note  Patient Details  Name: Betty Archer MRN: [id number] Date of Birth: 1960-06-07  Today's Date: 09/07/2020 OT Individual Time: 6226-3335 OT Individual Time Calculation (min): 58 min   Session 2: OT Individual Time: 4562-5638 OT Individual Time Calculation (min): 45 min    Short Term Goals: Week 1:  OT Short Term Goal 1 (Week 1): Pt will complete STS in prep for standing ADL with consistent mod A in 2/3 trials. OT Short Term Goal 2 (Week 1): Pt will don pants with mod A + AE PRN. OT Short Term Goal 3 (Week 1): Pt will req no more than min VCs to terminate grooming task. OT Short Term Goal 4 (Week 1): Pt will maintain attention to task for at least 3 min with no more than min VCs.  Skilled Therapeutic Interventions/Progress Updates:    Pt recevied sitting in recliner doing word search puzzle. Pt with flat affect but polite and participatory in all of session. She was encouraged to attempt toileting at start of session. Pt completed stand pivot transfer with min A > w/c > BSC over toilet. Pt required min cueing for initiation and thoroughness of toileting tasks- BM smear but no void. Pt required min A for clothing management. Perseverative washing of the sink during oral care at the sink. Pt taken via w/c to the therapy gym. She completed standing level BITS, visual scanning task for 2 min, with focus on sustained attention to task, functional reaching and static/dynamic standing balance. Pt required only min cueing for sustained attention for 2 min- no distractions in line of sight or gym, but hallway conversations present. Pt with very poor righting reactions and protective responses, statically able to maintain balance with CGA but when a small perturbation is present or pt reaches outside of BOS, she required quick heavy mod A to remain standing. Second trial with letter sequence proved to be more difficult, pt with increased L lean/LOB when dual processing  demands increased. She required mod A throughout 2 min trial to remain standing. She also was unable to sequence letters after "M" despite quickly moving through A-M. She was also unable to verbally process with OT through these challenges. Following rest break, pt was able to complete the same activity with [facility] improved accuracy and was assisted greatly by instruction to verbally state sequence. Pt ended sequence with 50 ft of functional mobility without AD- min-mod A overall, cueing required for pacing and attention to task. Pt was left sitting in the recliner with all needs met, chair alarm set.   Session 2:  Pt received sitting in the recliner with her sister present, agreeable to shower. Pt completed ambulatory transfer into the bathroom and shower with min A. She demonstrated poor awareness of deficits and was unable to identify errors and ask for assistance throughout session. Improved sustained/selective attention at shower level. Good automatic motor planning of bathing, some perseveration but pt able to move on without cueing. L LOB during standing level peri hygiene and UB bathing. Pt also frequently has seated L LOB, min A to correct and poor awareness. She donned shirt with set up assist. Min A to don pants. Improvement in BUE coordination and strength as pt was able to braid her hair. Increased fatigue following in her BUE and pt required several brief rest breaks to braid two sections of her hair. Pt was left sitting up in the recliner with all needs met, chair alarm set.    Therapy Documentation Precautions:  Precautions Precautions: Fall Precaution Comments: L hemi with LLE>LUE, cognitive deficits, falls asleep easily Restrictions Weight Bearing Restrictions: No   Therapy/Group: Individual Therapy  Curtis Sites 09/07/2020, 6:06 AM

## 2020-09-07 NOTE — Progress Notes (Signed)
PROGRESS NOTE   Subjective/Complaints:  Pt up in chair watching tv. Just finished breakfast. Denies any problems. Asked her how therapy was going and she said "fine". Didn't recall what she did yesterday  ROS: Limited due to cognitive/behavioral     Objective:   No results found. Recent Labs    09/06/20 0725  WBC 10.2  HGB 9.4*  HCT 30.2*  PLT 306   Recent Labs    09/06/20 0725  NA 138  K 4.1  CL 104  CO2 25  GLUCOSE 89  BUN 11  CREATININE 0.71  CALCIUM 9.3    Intake/Output Summary (Last 24 hours) at 09/07/2020 0956 Last data filed at 09/07/2020 0937 Gross per 24 hour  Intake 436 ml  Output --  Net 436 ml        Physical Exam: Vital Signs Blood pressure 106/69, pulse 84, temperature 98.9 F (37.2 C), temperature source Oral, resp. rate 16, height 5\' 6"  (1.676 m), weight 83 kg, SpO2 100 %.    Constitutional: No distress . Vital signs reviewed. HEENT: EOMI, oral membranes moist Neck: supple Cardiovascular: RRR without murmur. No JVD    Respiratory/Chest: CTA Bilaterally without wheezes or rales. Normal effort    GI/Abdomen: BS +, non-tender, non-distended Ext: no clubbing, cyanosis, or edema Psych: flat, cooperative Musculoskeletal:        General: No swelling or tenderness. Normal range of motion.    Cervical back: Neck supple. Skin:      Skin is warm.    Coloration: Skin is not jaundiced or pale. Neurological:    Comments: very alert. More attentive.    Tracks to left and right. Moves RUE and RLE spontaneously. 3-4/5 LUE and 3- to 3/5LLE. Sensed pain on left side. No abnormal tone.        Assessment/Plan: 1. Functional deficits which require 3+ hours per day of interdisciplinary therapy in a comprehensive inpatient rehab setting. Physiatrist is providing close team supervision and 24 hour management of active medical problems listed below. Physiatrist and rehab team continue to assess  barriers to discharge/monitor patient progress toward functional and medical goals  Care Tool:  Bathing    Body parts bathed by patient: Right arm, Left arm, Chest, Abdomen, Face, Right upper leg, Left upper leg, Front perineal area, Buttocks, Right lower leg, Left lower leg     Body parts n/a: Front perineal area, Buttocks, Right lower leg, Left lower leg   Bathing assist Assist Level: Minimal Assistance - Patient > 75%     Upper Body Dressing/Undressing Upper body dressing   What is the patient wearing?: Pull over shirt    Upper body assist Assist Level: Contact Guard/Touching assist    Lower Body Dressing/Undressing Lower body dressing      What is the patient wearing?: Incontinence brief     Lower body assist Assist for lower body dressing: Moderate Assistance - Patient 50 - 74%     Toileting Toileting    Toileting assist Assist for toileting: Minimal Assistance - Patient > 75%     Transfers Chair/bed transfer  Transfers assist     Chair/bed transfer assist level: Minimal Assistance - Patient > 75%  Locomotion Ambulation   Ambulation assist      Assist level: Minimal Assistance - Patient > 75% Assistive device: Hand held assist Max distance: >200 ft   Walk 10 feet activity   Assist  Walk 10 feet activity did not occur: Safety/medical concerns  Assist level: Minimal Assistance - Patient > 75% Assistive device: Hand held assist   Walk 50 feet activity   Assist Walk 50 feet with 2 turns activity did not occur: Safety/medical concerns  Assist level: Minimal Assistance - Patient > 75% Assistive device: Hand held assist    Walk 150 feet activity   Assist Walk 150 feet activity did not occur: Safety/medical concerns  Assist level: Minimal Assistance - Patient > 75% Assistive device: Hand held assist    Walk 10 feet on uneven surface  activity   Assist Walk 10 feet on uneven surfaces activity did not occur: Safety/medical  concerns         Wheelchair     Assist Will patient use wheelchair at discharge?: Yes Type of Wheelchair: Manual Wheelchair activity did not occur: Safety/medical concerns         Wheelchair 50 feet with 2 turns activity    Assist    Wheelchair 50 feet with 2 turns activity did not occur: Safety/medical concerns       Wheelchair 150 feet activity     Assist  Wheelchair 150 feet activity did not occur: Safety/medical concerns       Blood pressure 106/69, pulse 84, temperature 98.9 F (37.2 C), temperature source Oral, resp. rate 16, height 5\' 6"  (1.676 m), weight 83 kg, SpO2 100 %.  Medical Problem List and Plan: 1.  Altered mental status with decreased functional mobility/left lower extremity weakness secondary to large right ACA and small left ACA infarct likely related to recent ACOM aneurysm post coiling and stenting             -patient may shower             -ELOS/Goals: 10-14 days, supervision goals with PT, OT, SLP  -Continue CIR therapies including PT, OT, and SLP, team conf today 2.  Antithrombotics: -DVT/anticoagulation: SCDs             -antiplatelet therapy: Brilinta 90 mg twice daily, aspirin 81 mg daily 3. Pain Management: Lidoderm patch as directed, Tylenol as needed  7/19- denies pain-   4. Mood: Team to provide emotional support as necessary             -antipsychotic agents: N/A 5. Neuropsych: This patient is not capable of making decisions on her own behalf. 6. Skin/Wound Care: Routine skin checks 7. Fluids/Electrolytes/Nutrition: Routine in and outs with follow-up chemistries             -encourage appropriate PO    +1700cc since admit 8.  Hyperlipidemia.  Lipitor 9.  Hypertension.  Monitor with increased mobility    7/19- BP controlled         10.  History of tobacco abuse.  Counseling  11. Poor initiation/sleepy-   7/18 amantadine with  middling results.    7/19 more alert today. Continue ritalin 12. Constipation 7/18-  had bm  Sorbitol x1   -Senokot 1 tab daily for daily regimen      LOS: 4 days A FACE TO FACE EVALUATION WAS PERFORMED  [name] 09/07/2020, 9:56 AM

## 2020-09-07 NOTE — Progress Notes (Signed)
Speech Language Pathology Daily Session Note  Patient Details  Name: Betty Archer MRN: [id number] Date of Birth: [birth year]  Today's Date: 09/07/2020 SLP Individual Time: 0910-0950 SLP Individual Time Calculation (min): 40 min  Short Term Goals: Week 1: SLP Short Term Goal 1 (Week 1): Pt will sustain attention for 6-7 minutes with min A for redirection. SLP Short Term Goal 2 (Week 1): Pt will recall relevant medical information with use of external aids with min A verbal cues. SLP Short Term Goal 3 (Week 1): Pt will complete basic problem solving tasks related to medication management and basic finances with mod A verbal and visual cues. SLP Short Term Goal 4 (Week 1): Pt will demonstrate intellectual awareness of cognitive and physical limitations with mod A verbal and visual cues.  Skilled Therapeutic Interventions: Skilled treatment session focused on cognitive goals. Upon arrival, patient was upright in the recliner consuming her breakfast meal. Patient required Mod verbal cues for selective attention to self-feeding and required more than a reasonable amount of time for mastication, suspect due to poor awareness of bolus. SLP also facilitated session by providing Mod A verbal cues for problem solving and error awareness during a basic money management task. Patient demonstrated sustained attention to task for ~2 minutes with Mod verbal cues. At end of session, patient was still masticating eggs and pocketing them in her cheek, suspect due to poor attention while attempting to focus on money management task. Patient with mildly improved length of utterance with open-ended questions but continues with a flat affect. Patient left upright in recliner with alarm on and all needs within reach. Continue with current plan of care.      Pain Pain Assessment Pain Scale: 0-10 Pain Score: 0-No pain Faces Pain Scale: No hurt  Therapy/Group: Individual Therapy  Rudean Icenhour 09/07/2020, 10:00  AM

## 2020-09-07 NOTE — Progress Notes (Signed)
Patient ID: Betty Archer, female   DOB: December 30, 1960, 60 y.o.   MRN: [id number]  SW met with pt and pt sisters: Ezzard Flax and Shirlean Mylar (on phone) to provide updates from team conference, and d/c date 8/5. PT aware SW to f/u with his son [place].  SW spoke with [name] [phone]) to provide above updates. SW informed on pt currently being Mid A and Goal is CG/Supervision. SW reviewed [facility] and/or outpatient. SW informed will discuss family edu closer towards d/c date. SW will f/u after team conference next week.   SW spoke with [facility] [phone]) to schedule new pt appt/hospital follow-up. Appt scheduled for 9/6 at 1:30pm with Gates Rigg, NP. Option for mobile clinic which is first come/first serve and can have medication needs addressed. New schedule does not come out until August. Will present this option to family.  Loralee Pacas, MSW, [place] Office: [phone] Cell: [phone] Fax: [phone]

## 2020-09-07 NOTE — Patient Care Conference (Signed)
Inpatient RehabilitationTeam Conference and Plan of Care Update Date: 09/07/2020   Time: 10:02 AM    Patient Name: Betty Archer      Medical Record Number: [id number]  Date of Birth: [birth year] Sex: Female         Room/Bed: 4W02C/4W02C-01 Payor Info: Payor: /    Admit Date/Time:  09/03/2020  2:06 PM  Primary Diagnosis:  Acute ischemic cerebrovascular accident (CVA) involving anterior cerebral artery territory [facility])  Hospital Problems: Principal Problem:   Acute ischemic cerebrovascular accident (CVA) involving anterior cerebral artery territory [facility])    Expected Discharge Date: Expected Discharge Date: 09/24/20  Team Members Present: Physician leading conference: Dr. Alger Simons Care Coodinator Present: Loralee Pacas, LCSWA;Mischelle Reeg Creig Hines, RN, BSN, CRRN Nurse Present: Dorthula Nettles, RN PT Present: Tereasa Coop, PT OT Present: Laverle Hobby, OT SLP Present: Weston Anna, SLP PPS Coordinator present : Gunnar Fusi, SLP     Current Status/Progress Goal Weekly Team Focus  Bowel/Bladder   Patient Is Incontinent of Bladder and Bowel. Toilet Frequently (Q2h)  Regain Bowel and Bladder control.      Swallow/Nutrition/ Hydration             ADL's   Min A UB ADLs, mod A LB ADLs, min A transfers, flat affect and poor initiation  CGA- supervision  initiation/termination of tasks, ADLs, transfers, cognition   Mobility   Min A overall, mod A with fatigue/lethargy, gait 350 ft HHA, very externally distracted with all mobility  CGA-supervision overall  Functional mobility, gait and stair training (1 flight to apartment), activity tolerance, arousal, attention, balance, patient/caregiver education   Communication             Safety/Cognition/ Behavioral Observations  Mod A  Supervision  complex problem solving, recall, attention and awareness   Pain   Denies Pain  Will remain pain free.      Skin   Skin Intergrity Intact.  To Maintain Intact Skin.        Discharge  Planning:  Pt uninsured. Pt to d/c to home with support from sons/various family. Will confirm no barriers to discharge.   Team Discussion: Fairly lethargic, added Ritalin, BP good. Incontinent B/B, bruising to the right groin and thigh. Patient informs family that she is wet and not staff. Family does a lot of speaking for the patient. Patient on target to meet rehab goals: Poor initiation, attention, flat affect. Contact guard goals. Min assist when engaged, mod assist when fatigued. SLP reports she has mod/severe inattention, and poor initiation.  *See Care Plan and progress notes for long and short-term goals.   Revisions to Treatment Plan:  Added Ritalin to maximize cognition/arousal.  Teaching Needs: Family education, medication management, skin/wound care, transfer training, gait training, balance training, endurance training, stair training, safety awareness.  Current Barriers to Discharge: Decreased caregiver support, Medical stability, Home enviroment access/layout, Incontinence, Wound care, Lack of/limited family support, Weight, Medication compliance, and Behavior  Possible Resolutions to Barriers: Continue current medications, provide emotional support.     Medical Summary Current Status: R>L ACA infarcts after ACOM aneurysm. lethargic, incontinent. bp better controlled  Barriers to Discharge: Medical stability   Possible Resolutions to [employer] Focus: daily assessment of labs and pt data. maximize cognition/arousal.   Continued Need for Acute Rehabilitation Level of Care: The patient requires daily medical management by a physician with specialized training in physical medicine and rehabilitation for the following reasons: Direction of a multidisciplinary physical rehabilitation program to maximize functional independence : Yes Medical management  of patient stability for increased activity during participation in an intensive rehabilitation regime.: Yes Analysis of  laboratory values and/or radiology reports with any subsequent need for medication adjustment and/or medical intervention. : Yes   I attest that I was present, lead the team conference, and concur with the assessment and plan of the team.   Cristi Loron 09/07/2020, 3:25 PM

## 2020-09-07 NOTE — Plan of Care (Signed)
  Problem: Consults Goal: RH STROKE PATIENT EDUCATION Description: See Patient Education module for education specifics  Outcome: Progressing   Problem: RH BOWEL ELIMINATION Goal: RH STG MANAGE BOWEL WITH ASSISTANCE Description: STG Manage Bowel with Supervision Assistance. Outcome: Progressing   Problem: RH SKIN INTEGRITY Goal: RH STG MAINTAIN SKIN INTEGRITY WITH ASSISTANCE Description: STG Maintain Skin Integrity With Supervision Assistance. Outcome: Progressing   Problem: RH SAFETY Goal: RH STG ADHERE TO SAFETY PRECAUTIONS W/ASSISTANCE/DEVICE Description: STG Adhere to Safety Precautions With supervision Assistance/Device. Outcome: Progressing Goal: RH STG DECREASED RISK OF FALL WITH ASSISTANCE Description: STG Decreased Risk of Fall With supervision Assistance. Outcome: Progressing   Problem: RH PAIN MANAGEMENT Goal: RH STG PAIN MANAGED AT OR BELOW PT'S PAIN GOAL Description: < 3 on a 0-10 pain scale. Outcome: Progressing   Problem: RH KNOWLEDGE DEFICIT Goal: RH STG INCREASE KNOWLEDGE OF STROKE PROPHYLAXIS Description: Patient will be able to demonstrate knowledge of medications used to prevent future strokes with educational materials and handouts provided by staff independently at discharge. Outcome: Progressing

## 2020-09-08 LAB — PLATELET INHIBITION P2Y12: Platelet Function  P2Y12: 32 [PRU] — ABNORMAL LOW (ref 182–335)

## 2020-09-08 MED ORDER — SENNA 8.6 MG PO TABS
1.0000 | ORAL_TABLET | Freq: Two times a day (BID) | ORAL | Status: DC
  Administered 2020-09-08 – 2020-09-09 (×2): 8.6 mg via ORAL
  Filled 2020-09-08 (×2): qty 1

## 2020-09-08 MED ORDER — POLYETHYLENE GLYCOL 3350 17 G PO PACK
17.0000 g | PACK | Freq: Every day | ORAL | Status: DC
  Administered 2020-09-08 – 2020-09-24 (×16): 17 g via ORAL
  Filled 2020-09-08 (×17): qty 1

## 2020-09-08 MED ORDER — BISACODYL 10 MG RE SUPP
10.0000 mg | Freq: Every day | RECTAL | Status: DC | PRN
  Administered 2020-09-08 – 2020-09-09 (×2): 10 mg via RECTAL
  Filled 2020-09-08 (×2): qty 1

## 2020-09-08 NOTE — Progress Notes (Signed)
Occupational Therapy Session Note  Patient Details  Name: Betty Archer MRN: [id number] Date of Birth: 1960/04/14  Today's Date: 09/08/2020 OT Individual Time: 1430-1445 OT Individual Time Calculation (min): 15 min  and Today's Date: 09/08/2020 OT Missed Time: 15 Minutes Missed Time Reason: Nursing care   Short Term Goals: Week 1:  OT Short Term Goal 1 (Week 1): Pt will complete STS in prep for standing ADL with consistent mod A in 2/3 trials. OT Short Term Goal 2 (Week 1): Pt will don pants with mod A + AE PRN. OT Short Term Goal 3 (Week 1): Pt will req no more than min VCs to terminate grooming task. OT Short Term Goal 4 (Week 1): Pt will maintain attention to task for at least 3 min with no more than min VCs.  Skilled Therapeutic Interventions/Progress Updates:    Pt received sitting on the toilet with RN supervising. Pt unable to void and RN reporting pt is due for suppository. Pt stood from the toilet with mod cueing for initiation and required mod A for clothing management. Ambulatory transfer back to bed with min A. She returned to supine and OT assisted RN in clothing removal and positioning for suppository placement. Pt left supine per RN request. 15 min missed d/t nursing care.   Therapy Documentation Precautions:  Precautions Precautions: Fall Precaution Comments: L hemi with LLE>LUE, cognitive deficits, falls asleep easily Restrictions Weight Bearing Restrictions: No   Therapy/Group: Individual Therapy  Curtis Sites 09/08/2020, 6:26 AM

## 2020-09-08 NOTE — Progress Notes (Signed)
Physical Therapy Session Note  Patient Details  Name: Betty Archer MRN: [id number] Date of Birth: May 28, 1960  Today's Date: 09/08/2020 PT Individual Time: 1310-1410 PT Individual Time Calculation (min): 60 min   Short Term Goals: Week 1:  PT Short Term Goal 1 (Week 1): Pt will performbed mobility with Min A. PT Short Term Goal 2 (Week 1): Pt will perform STS transfers with overall Mod A. PT Short Term Goal 3 (Week 1): Pt will perform seat-to-seat transfers with overall Mod A. PT Short Term Goal 4 (Week 1): Pt will initiate gait training and complete 25 feet with Mod A using LRAD. PT Short Term Goal 5 (Week 1): Pt will initiate w/c mobility training.  Skilled Therapeutic Interventions/Progress Updates:     Patient in w/c in the room on the phone upon PT arrival. Patient alert and agreeable to PT session. Patient reported 5-6/10 rectal pain from constipation during session, RN made aware. PT provided repositioning, rest breaks, and distraction as pain interventions throughout session.   Therapeutic Activity: Transfers: Patient performed sit to/from stand x6 with CGA-min A without AD and increased time for coming to erect position. Provided verbal cues for forward weight shift and gluteal and quad activation for increased trunk/hip/knee extension to come to standing.  Gait Training:  Patient ambulated >200 feet x2 and >25 feet x2 using HHA with min A. Ambulated with crouched gait initially, but improved with increased repetition and cues, continues to demonstrate intermittent decreased L foot clearance due to decreased DF and hamstring activation in swing, very slow gait speed and small step length with improved attention to task this session. Provided verbal cues for leading with her heels for foot clearance, facilitation for R weight shift, and cues for increased gluteal activation in stance. Patient able to path find back to her room without cues this session.  Patient ascended/descended 6"  steps x16 using B rails with min A-CGA. Performed reciprocal gait pattern throughout. Provided cues for technique and sequencing.  Patient ascended/descended 8" step/curb using HHA and reaching out for a rail for balance when stepping up and down with min A. Performed step-to gait pattern leading with R while ascending and L while descending. Provided cues for technique and sequencing.   Neuromuscular Re-ed: Patient performed the following standing balance and limits of stability activities: -standing balance x5 min matching cards to standing mirror card boards, PT removed 9 cards from 3 of the 6 boards to assess matching with busy visual environment, patient matched 26/27 correctly, increased matching time with fatigue, patient dropped one card and picked it up with min A, required CGA-min A for standing balance throughout with intermittent posterior LOB, able to self-correct with min A -[employer] limits of stability challenge on level 12 x2: trial 1: 2:37 13%, trial 2: 2:20 19% with B upper extremity support and min cues for technique, required min A to step up/down and CGA for balance throughout activity  Played patient selected music (90's R&B) during latter half of session for increased patient engagement. Patient began to sing lyrics x3 during activities and showed good verbal skills with this.  Patient on the toilet handed off to RN due to constipation at end of session.    Therapy Documentation Precautions:  Precautions Precautions: Fall Precaution Comments: L hemi with LLE>LUE, cognitive deficits, falls asleep easily Restrictions Weight Bearing Restrictions: No    Therapy/Group: Individual Therapy  Jaren Kearn L Tyger Oka PT, DPT  09/08/2020, 4:45 PM

## 2020-09-08 NOTE — Progress Notes (Signed)
Speech Language Pathology Daily Session Note  Patient Details  Name: JAIDY COTTAM MRN: [id number] Date of Birth: 07-11-1960  Today's Date: 09/08/2020 SLP Individual Time: 0930-1025 SLP Individual Time Calculation (min): 55 min  Short Term Goals: Week 1: SLP Short Term Goal 1 (Week 1): Pt will sustain attention for 6-7 minutes with min A for redirection. SLP Short Term Goal 2 (Week 1): Pt will recall relevant medical information with use of external aids with min A verbal cues. SLP Short Term Goal 3 (Week 1): Pt will complete basic problem solving tasks related to medication management and basic finances with mod A verbal and visual cues. SLP Short Term Goal 4 (Week 1): Pt will demonstrate intellectual awareness of cognitive and physical limitations with mod A verbal and visual cues.  Skilled Therapeutic Interventions: Skilled treatment session focused on cognitive goals. SLP facilitated session by providing extra time and overall supervision verbal cues for initiation and Min-Mod A verbal cues for problem solving and error awareness during a complex medication task in which she had to identify administration errors. Patient demonstrated sustained attention to task for ~35 minutes with supervision verbal cues for redirection. Overall, patient demonstrated improved attention, initiation, verbal expression and social engagement. Patient left upright in the wheelchair with alarm on and all needs within reach. Continue with current plan of care.      Pain No/Denies Pain   Therapy/Group: Individual Therapy  Mckenzey Parcell 09/08/2020, 12:10 PM

## 2020-09-08 NOTE — Progress Notes (Signed)
Occupational Therapy Session Note  Patient Details  Name: Betty Archer MRN: [id number] Date of Birth: 08-21-1960  Today's Date: 09/08/2020 OT Individual Time: 0800-0900 OT Individual Time Calculation (min): 60 min    Short Term Goals: Week 1:  OT Short Term Goal 1 (Week 1): Pt will complete STS in prep for standing ADL with consistent mod A in 2/3 trials. OT Short Term Goal 2 (Week 1): Pt will don pants with mod A + AE PRN. OT Short Term Goal 3 (Week 1): Pt will req no more than min VCs to terminate grooming task. OT Short Term Goal 4 (Week 1): Pt will maintain attention to task for at least 3 min with no more than min VCs.  Skilled Therapeutic Interventions/Progress Updates:     Pt received in bed with no pain reported. Agreeable to full shower level ADL. ADL:  Pt completes bathing with supervision seated bathing in shower with VC for termination of rinsing, but able to sequence bathing body parts. Pt requires MOD A for standing balance d/t post lean with grab bar and L knee block in shower while standing to wash buttocks and peri area Pt completes UB dressing with supervision Pt completes LB dressing with MOD A for pants d/t decreased attention to threading LLE fully Pt completes footwear with S to cross into figure 4 with no VC Pt incontinent of bladder in brief. Pt states she can feel when need to have bladder movement, however likely lacks initiation to call. Pt completes shower/Tub transfer with Supervision sit to stand in stedy to transfer onto TTB in shower Pt completes grooming with setup for face washing and oral care pt demo poor termination with face washing >2 min. Toileting/toilet transfers for efficiency with grab bar in prep for nursing staff ability to toilet patient. MIN-MOD A with grab bar overall. Small BM in toilet with increased time to wipe and guarding A   Pt left at end of session in bed with exit alarm on, call light in reach and all needs met   Therapy  Documentation Precautions:  Precautions Precautions: Fall Precaution Comments: L hemi with LLE>LUE, cognitive deficits, falls asleep easily Restrictions Weight Bearing Restrictions: No General:   Vital Signs: Therapy Vitals Temp: 98.6 F (37 C) Temp Source: Oral Pulse Rate: 73 Resp: 18 BP: 116/71 Patient Position (if appropriate): Lying Oxygen Therapy SpO2: 100 % O2 Device: Room Air Pain:   ADL: ADL Eating: Supervision/safety Where Assessed-Eating: Wheelchair Grooming: Supervision/safety Where Assessed-Grooming: Sitting at sink Upper Body Bathing: Supervision/safety Where Assessed-Upper Body Bathing: Wheelchair Lower Body Bathing: Minimal assistance Where Assessed-Lower Body Bathing: Bed level Upper Body Dressing: Moderate assistance Where Assessed-Upper Body Dressing: Edge of bed Lower Body Dressing: Dependent Where Assessed-Lower Body Dressing: Edge of bed Toileting: Dependent Where Assessed-Toileting: Bed level Toilet Transfer: Dependent Toilet Transfer Method: Other (comment) (stedy) Tub/Shower Transfer: Not assessed Social research officer, government: Not assessed Vision   Perception    Praxis   Exercises:   Other Treatments:     Therapy/Group: Individual Therapy  Tonny Branch 09/08/2020, 6:46 AM

## 2020-09-08 NOTE — Plan of Care (Signed)
  Problem: Consults Goal: RH STROKE PATIENT EDUCATION Description: See Patient Education module for education specifics  Outcome: Progressing   Problem: RH BOWEL ELIMINATION Goal: RH STG MANAGE BOWEL WITH ASSISTANCE Description: STG Manage Bowel with Supervision Assistance. Outcome: Progressing   Problem: RH SKIN INTEGRITY Goal: RH STG MAINTAIN SKIN INTEGRITY WITH ASSISTANCE Description: STG Maintain Skin Integrity With Supervision Assistance. Outcome: Progressing   Problem: RH SAFETY Goal: RH STG ADHERE TO SAFETY PRECAUTIONS W/ASSISTANCE/DEVICE Description: STG Adhere to Safety Precautions With supervision Assistance/Device. Outcome: Progressing Goal: RH STG DECREASED RISK OF FALL WITH ASSISTANCE Description: STG Decreased Risk of Fall With supervision Assistance. Outcome: Progressing   Problem: RH PAIN MANAGEMENT Goal: RH STG PAIN MANAGED AT OR BELOW PT'S PAIN GOAL Description: < 3 on a 0-10 pain scale. Outcome: Progressing   Problem: RH KNOWLEDGE DEFICIT Goal: RH STG INCREASE KNOWLEDGE OF STROKE PROPHYLAXIS Description: Patient will be able to demonstrate knowledge of medications used to prevent future strokes with educational materials and handouts provided by staff independently at discharge. Outcome: Progressing

## 2020-09-09 MED ORDER — METHYLPHENIDATE HCL 5 MG PO TABS
10.0000 mg | ORAL_TABLET | Freq: Two times a day (BID) | ORAL | Status: DC
  Administered 2020-09-09 – 2020-09-13 (×8): 10 mg via ORAL
  Filled 2020-09-09 (×8): qty 2

## 2020-09-09 MED ORDER — SENNA 8.6 MG PO TABS
1.0000 | ORAL_TABLET | Freq: Every day | ORAL | Status: DC
  Administered 2020-09-10 – 2020-09-20 (×11): 8.6 mg via ORAL
  Filled 2020-09-09 (×11): qty 1

## 2020-09-09 NOTE — Progress Notes (Signed)
Occupational Therapy Session Note  Patient Details  Name: Betty Archer MRN: [id number] Date of Birth: 07-30-1960  Today's Date: 09/09/2020 OT Individual Time: 1000-1056 OT Individual Time Calculation (min): 56 min    Short Term Goals: Week 1:  OT Short Term Goal 1 (Week 1): Pt will complete STS in prep for standing ADL with consistent mod A in 2/3 trials. OT Short Term Goal 2 (Week 1): Pt will don pants with mod A + AE PRN. OT Short Term Goal 3 (Week 1): Pt will req no more than min VCs to terminate grooming task. OT Short Term Goal 4 (Week 1): Pt will maintain attention to task for at least 3 min with no more than min VCs.  Skilled Therapeutic Interventions/Progress Updates:     Pt received in w/c with no pain but reporting brief is wet. Brief is soaked and so are pants. Pt agreeable to changing and OT educates on calling for A to change and not sitting in urine d/t UTI risk  ADL:  Pt completes bathing with MIN A for standing balance ot wash peri area, buttocks an upper thighs d/t large incontinent bladder void in brief with malodorous smell- RN alerted. Pt able to stand with MIN A and guarding LLE for buckling. 3 minor LOB backwaeds with MOD A to recover Pt completes LB dressing with MIN A to don pats at sit to stand with VC for hand palcemnet. MIN A to don shoes and maintain LLE in figure 4 for fastening laces  Therapeutic activity Box and blocks assessment: LUE 34 RUE 35 - pt demo difficulty with following 1 block at a time rule during first round requiring re start of assessment.  9HPT LUE 1 min 25 seconds- mild tremulous movement noted with FMC RUE: 40.3 seconds  Dynamometer: RUE: 45, 40, 40 LUE: 30, 32, 32  Provided soft theraputty and pt able to follow cues to complete squeeze, pull apart and flatten activity in tx space. Pt able to recall 3/3 when returned to room at end of session.  Pt left at end of session in w/c with exit alarm on, call light in reach and all needs  met   Therapy Documentation Precautions:  Precautions Precautions: Fall Precaution Comments: L hemi with LLE>LUE, cognitive deficits, falls asleep easily Restrictions Weight Bearing Restrictions: No General:   Vital Signs: Therapy Vitals Temp: 98.6 F (37 C) Temp Source: Oral Pulse Rate: 75 Resp: 18 BP: 103/65 Patient Position (if appropriate): Lying Oxygen Therapy SpO2: 100 % O2 Device: Room Air Pain:   ADL: ADL Eating: Supervision/safety Where Assessed-Eating: Wheelchair Grooming: Supervision/safety Where Assessed-Grooming: Sitting at sink Upper Body Bathing: Supervision/safety Where Assessed-Upper Body Bathing: Wheelchair Lower Body Bathing: Minimal assistance Where Assessed-Lower Body Bathing: Bed level Upper Body Dressing: Moderate assistance Where Assessed-Upper Body Dressing: Edge of bed Lower Body Dressing: Dependent Where Assessed-Lower Body Dressing: Edge of bed Toileting: Dependent Where Assessed-Toileting: Bed level Toilet Transfer: Dependent Toilet Transfer Method: Other (comment) (stedy) Tub/Shower Transfer: Not assessed Social research officer, government: Not assessed Vision   Perception    Praxis   Exercises:   Other Treatments:     Therapy/Group: Individual Therapy  Tonny Branch 09/09/2020, 6:50 AM

## 2020-09-09 NOTE — Progress Notes (Signed)
Speech Language Pathology Weekly Progress and Session Note  Patient Details  Name: Betty Archer MRN: [id number] Date of Birth: [birth year]  Beginning of progress report period: September 03, 2020 End of progress report period: September 09, 2020  Today's Date: 09/09/2020 SLP Individual Time: 0725-0825 SLP Individual Time Calculation (min): 60 min  Short Term Goals: Week 1: SLP Short Term Goal 1 (Week 1): Pt will sustain attention for 6-7 minutes with min A for redirection. SLP Short Term Goal 1 - Progress (Week 1): Met SLP Short Term Goal 2 (Week 1): Pt will recall relevant medical information with use of external aids with min A verbal cues. SLP Short Term Goal 2 - Progress (Week 1): Not met SLP Short Term Goal 3 (Week 1): Pt will complete basic problem solving tasks related to medication management and basic finances with mod A verbal and visual cues. SLP Short Term Goal 3 - Progress (Week 1): Met SLP Short Term Goal 4 (Week 1): Pt will demonstrate intellectual awareness of cognitive and physical limitations with mod A verbal and visual cues. SLP Short Term Goal 4 - Progress (Week 1): Met    New Short Term Goals: Week 2: SLP Short Term Goal 1 (Week 2): Patient will demonstrate sustained attention to functional tasks for 10 minutes with Min verbal cues for redirection. SLP Short Term Goal 2 (Week 2): Patient will complete basic problem solving tasks with min A verbal and visual cues. SLP Short Term Goal 3 (Week 2): Patient will self-monitor and correct errors during functional tasks with Min verbal and visual cues. SLP Short Term Goal 4 (Week 2): Patient will recall new and functional information with Mod A multimodal cues.  Weekly Progress Updates: Patient has made functional gains and has met 3 of 4 STGs this reporting period. As the week has progressed, patient's overall cognitive functioning has improved, suspect due to improve arousal and attention. Currently, patient requires overall Mod A  multimodal cues to complete functional and familiar tasks safely in regards to sustained attention, functional problem solving, recall of daily information and awareness. Patient also demonstrates improved initiation of functional tasks as well as improved social and verbal engagement. Patient and family education ongoing. Patient would benefit from continued skilled SLP intervention to maximize her cognitive functioning and overall functional independence prior to discharge.      Intensity: Minumum of 1-2 x/day, 30 to 90 minutes Frequency: 3 to 5 out of 7 days Duration/Length of Stay: 09/24/20 Treatment/Interventions: Cognitive remediation/compensation;Patient/family education;Therapeutic Activities;Internal/external aids;Cueing hierarchy;Environmental controls;Functional tasks   Daily Session  Skilled Therapeutic Interventions:  Skilled treatment session focused on cognitive goals. Upon arrival, patient was awake and bed and reported she had already completed her breakfast meal. With extra time, patient sat EOB to donn pants. Patient required Min A for standing balance while pulling up her pants and was transferred to the wheelchair. While at the sink, it appeared the patient was chewing something in her oral cavity. Patient initiated oral care with Mod verbal cues and would only rinse out her mouth despite cues for brushing. A large amount of food was cleared from her oral cavity. SLP also facilitated session by providing extra time and Max A verbal cues problem solving, organization and error awareness during a mildly complex scheduling task. Min A verbal cues were needed for sustained attention to task for ~30 minutes. The task was not completed due to time constraints. Patient left upright in the wheelchair with alarm on and all needs within reach. Continue  with current plan of care.     Pain No/Denies Pain   Therapy/Group: Individual Therapy  Sharai Overbay 09/09/2020, 6:38 AM

## 2020-09-09 NOTE — Progress Notes (Signed)
PROGRESS NOTE   Subjective/Complaints:  Just wrapped up with SLP. Recalled what she worked on with therapist. Denies any problems this morning  ROS: Limited due to cognitive/behavioral      Objective:   No results found. No results for input(s): WBC, HGB, HCT, PLT in the last 72 hours.  No results for input(s): NA, K, CL, CO2, GLUCOSE, BUN, CREATININE, CALCIUM in the last 72 hours.   Intake/Output Summary (Last 24 hours) at 09/09/2020 0926 Last data filed at 09/09/2020 0740 Gross per 24 hour  Intake 360 ml  Output --  Net 360 ml        Physical Exam: Vital Signs Blood pressure 103/65, pulse 75, temperature 98.6 F (37 C), temperature source Oral, resp. rate 18, height 5\' 6"  (1.676 m), weight 83 kg, SpO2 100 %.    Constitutional: No distress . Vital signs reviewed. HEENT: EOMI, oral membranes moist Neck: supple Cardiovascular: RRR without murmur. No JVD    Respiratory/Chest: CTA Bilaterally without wheezes or rales. Normal effort    GI/Abdomen: BS +, non-tender, non-distended Ext: no clubbing, cyanosis, or edema Psych: flat but cooperative Musculoskeletal:        General: No swelling or tenderness. Normal range of motion.    Cervical back: Neck supple. Skin:      Skin is warm.    Coloration: Skin is not jaundiced or pale. Neurological:    Comments: very alert. More attentive.  Improved insight and awareness, memory.   Tracks to left and right. Moves RUE and RLE spontaneously. 4-/5 LUE and   3/5LLE. Sensed pain on left side. No abnormal tone.        Assessment/Plan: 1. Functional deficits which require 3+ hours per day of interdisciplinary therapy in a comprehensive inpatient rehab setting. Physiatrist is providing close team supervision and 24 hour management of active medical problems listed below. Physiatrist and rehab team continue to assess barriers to discharge/monitor patient progress toward  functional and medical goals  Care Tool:  Bathing    Body parts bathed by patient: Right arm, Left arm, Chest, Abdomen, Face, Right upper leg, Left upper leg, Front perineal area, Buttocks, Right lower leg, Left lower leg     Body parts n/a: Front perineal area, Buttocks, Right lower leg, Left lower leg   Bathing assist Assist Level: Minimal Assistance - Patient > 75%     Upper Body Dressing/Undressing Upper body dressing   What is the patient wearing?: Pull over shirt    Upper body assist Assist Level: Supervision/Verbal cueing    Lower Body Dressing/Undressing Lower body dressing      What is the patient wearing?: Pants, Incontinence brief     Lower body assist Assist for lower body dressing: Minimal Assistance - Patient > 75%     Toileting Toileting    Toileting assist Assist for toileting: Minimal Assistance - Patient > 75%     Transfers Chair/bed transfer  Transfers assist     Chair/bed transfer assist level: Minimal Assistance - Patient > 75%     Locomotion Ambulation   Ambulation assist      Assist level: Minimal Assistance - Patient > 75% Assistive device: Hand held assist Max  distance: >200 ft   Walk 10 feet activity   Assist  Walk 10 feet activity did not occur: Safety/medical concerns  Assist level: Minimal Assistance - Patient > 75% Assistive device: Hand held assist   Walk 50 feet activity   Assist Walk 50 feet with 2 turns activity did not occur: Safety/medical concerns  Assist level: Minimal Assistance - Patient > 75% Assistive device: Hand held assist    Walk 150 feet activity   Assist Walk 150 feet activity did not occur: Safety/medical concerns  Assist level: Minimal Assistance - Patient > 75% Assistive device: Hand held assist    Walk 10 feet on uneven surface  activity   Assist Walk 10 feet on uneven surfaces activity did not occur: Safety/medical concerns         Wheelchair     Assist Will patient  use wheelchair at discharge?: Yes Type of Wheelchair: Manual Wheelchair activity did not occur: Safety/medical concerns         Wheelchair 50 feet with 2 turns activity    Assist    Wheelchair 50 feet with 2 turns activity did not occur: Safety/medical concerns       Wheelchair 150 feet activity     Assist  Wheelchair 150 feet activity did not occur: Safety/medical concerns       Blood pressure 103/65, pulse 75, temperature 98.6 F (37 C), temperature source Oral, resp. rate 18, height 5\' 6"  (1.676 m), weight 83 kg, SpO2 100 %.  Medical Problem List and Plan: 1.  Altered mental status with decreased functional mobility/left lower extremity weakness secondary to large right ACA and small left ACA infarct likely related to recent ACOM aneurysm post coiling and stenting             -patient may shower             -ELOS/Goals: 10-14 days, supervision goals with PT, OT, SLP  -Continue CIR therapies including PT, OT, and SLP  2.  Antithrombotics: -DVT/anticoagulation: SCDs             -antiplatelet therapy: Brilinta 90 mg twice daily, aspirin 81 mg daily 3. Pain Management: Lidoderm patch as directed, Tylenol as needed  7/20- denies pain-   4. Mood: Team to provide emotional support as necessary             -antipsychotic agents: N/A 5. Neuropsych: This patient is not capable of making decisions on her own behalf. 6. Skin/Wound Care: Routine skin checks 7. Fluids/Electrolytes/Nutrition: Routine in and outs               -eating well 8.  Hyperlipidemia.  Lipitor 9.  Hypertension.  Monitor with increased mobility    7/20- BP controlled         10.  History of tobacco abuse.  Counseling  11. Poor initiation/sleepy-   7/21 ritalin seems to have helped initiation and arousal  -will increase to 10mg  bid and observe 12. Constipation 7/18-  had bm Sorbitol x1   -Senokot 1 tab daily for daily regimen      LOS: 6 days A FACE TO FACE EVALUATION WAS PERFORMED  [name] 09/09/2020, 9:26 AM

## 2020-09-09 NOTE — Progress Notes (Signed)
Physical Therapy Session Note  Patient Details  Name: Betty Archer MRN: [id number] Date of Birth: 1960-05-14  Today's Date: 09/09/2020 PT Individual Time: 8832-5498 PT Individual Time Calculation (min): 56 min   Short Term Goals: Week 1:  PT Short Term Goal 1 (Week 1): Pt will performbed mobility with Min A. PT Short Term Goal 2 (Week 1): Pt will perform STS transfers with overall Mod A. PT Short Term Goal 3 (Week 1): Pt will perform seat-to-seat transfers with overall Mod A. PT Short Term Goal 4 (Week 1): Pt will initiate gait training and complete 25 feet with Mod A using LRAD. PT Short Term Goal 5 (Week 1): Pt will initiate w/c mobility training.  Skilled Therapeutic Interventions/Progress Updates:     Patient ambulating without assist with seat belt alarm going off upon PT arrival. Patient's sister in the room calling for assist. Patient was incontinent of bowl and got up impulsively to go to the bathroom. Patient with poor recall of new information and will need increased reinforcement for use of call bell. Discussed moving patient closer to the nurses station and frequent checks on patient and offers for toileting with LPN. Patient alert and agreeable to PT session. Patient reported 6/10 rectal pain while having a BM during session and reported constipation, LPN made aware and administered a suppository. PT provided repositioning, rest breaks, and distraction as pain interventions throughout session.   Therapeutic Activity: Transfers: Patient performed sit to/from stand with supervision-CGA from w/c, BSC over the toilet, and an arm chair. Provided verbal cues for forward weight shift and safety awareness. Patient without further demonstration of impulsivity with standing throughout session.  Patient was incontinent and continent of bowl during session. Performed lower body clothing management with total A for doffing/donning brief and supervision for pulling down/up pants. Provided  verbal cues for rocking and relaxation during BM to reduce straining.   Gait Training:  Patient ambulated >300 feet x2 using HHA and progressing to no AD with min A, progressing to CGA with only intermittent min A due to LOB with distraction and looking side-to side. Provided cues for stopping to look at anything rather than looking while walking, required max cues for this during gait trials. Ambulated with decreased L foot clearance, narrow BOS, mild crouched gait, and significantly decreased gait speed. Provided max multimodal cues for increased gait speed, attention to task, increased L foot clearance, and increased BOS.  Neuromuscular Re-ed: Patient performed the following standing balance activities for improved righting reactions while performing a dual task: -standing at the BITS the patient performed the memory task for 5 min with 60% accuracy, demonstrated most challenge with 4 item recall, noted increased external distraction with other people in the room and messing with gait belt during task; had intermittent posterior LOB during task x3 requiring min A to correct, otherwise patient performed task with CGA -standing dancing 3x1 min with patient selected music focused on dynamic balance with a salient task with CGA-supervision  Patient in w/c with her sister in the room at end of session with breaks locked, seat belt alarm set, and all needs within reach.   Therapy Documentation Precautions:  Precautions Precautions: Fall Precaution Comments: L hemi with LLE>LUE, cognitive deficits, falls asleep easily Restrictions Weight Bearing Restrictions: No   Therapy/Group: Individual Therapy  Betty Archer PT, DPT  09/09/2020, 4:12 PM

## 2020-09-10 MED ORDER — HYDROCORTISONE (PERIANAL) 2.5 % EX CREA
TOPICAL_CREAM | Freq: Two times a day (BID) | CUTANEOUS | Status: DC | PRN
  Filled 2020-09-10: qty 28.35

## 2020-09-10 NOTE — Progress Notes (Signed)
Physical Therapy Session Note  Patient Details  Name: Betty Archer MRN: [id number] Date of Birth: [birth year]  Today's Date: 09/10/2020 PT Individual Time: [phone] and 1405-1500 PT Individual Time Calculation (min): 25 min and 55 min  Short Term Goals: Week 1:  PT Short Term Goal 1 (Week 1): Pt will performbed mobility with Min A. PT Short Term Goal 2 (Week 1): Pt will perform STS transfers with overall Mod A. PT Short Term Goal 3 (Week 1): Pt will perform seat-to-seat transfers with overall Mod A. PT Short Term Goal 4 (Week 1): Pt will initiate gait training and complete 25 feet with Mod A using LRAD. PT Short Term Goal 5 (Week 1): Pt will initiate w/c mobility training.  Skilled Therapeutic Interventions/Progress Updates:     1st Session: Pt received supine in bed and agrees to therapy. PT asks if pt needs to use restroom and pt says no. Pt asked if she has voided in brief and pt says no. Pt performs supine to sit with verbal cues on sequencing. CGA for sit to stand and PT notes that pt's shirt is saturated with urine. Pt performs ambulatory transfer to toilet with CGA and cues for sequencing and positioning. Pt continent of bowel on toilet and requires verbal cues for safety due to attempting to stand up several times without warning. PT assists to doff shirt and pants and pt performs pericare and upper body and lower body wipe down with setup assistance. Sit to stand with CGA and modA to don clean brief, pants, and shirt PT ambulates to sink to wash hands then ambulates around bed to chair with CGA and cues for upright gaze to improve posture and balance. Pt left seated in recliner with alarm intact and all needs within reach.  2nd Session: Pt received standing with RN attempting to ambulate to restroom. Handoff to PT and pt ambulates to toilet with CGA and cues for sequencing. Pt had been incontinent of bladder in brief. Pt is continent of bowel and partially continent of bladder, and  performs pericare with close supervision for safety due to pt's impulsivity. Pt performs ambulatory transfer to sink with CGA. Pt then ambulates >300' with CGA, with PT cueing for upright gaze to improve posture and balance, and increasing L stride length and stance time for reciprocal gait pattern. Pt tasked with counting doors in hallway on R, then counting same doorways when walking in the opposite direction and doors on L side. Pt requiring minimal cueing to attend to task and complete correctly when doors on R side, but does become distracted very easily and requires redirection  to task. With doors on L side, pt requires consistent cueing to attend to task and routinely counts doors on the R instead.  Pt performs Nustep for strength and endurance training as well as reciprocal coordination. PT utilizes music to encourage increased energy levels from pt as her baseline affect is very flat. Pt demonstrates good energy and activity performance with music, completing 15:00 on Nustep at workload of 6 with average steps per minute >50. Pt ambulates back to room with CGA. Left seated in recliner with alarm intact and and all needs within reach.  Therapy Documentation Precautions:  Precautions Precautions: Fall Precaution Comments: L hemi with LLE>LUE, cognitive deficits, falls asleep easily Restrictions Weight Bearing Restrictions: No    Therapy/Group: Individual Therapy  Breck Coons, PT, DPT 09/10/2020, 3:45 PM

## 2020-09-10 NOTE — Progress Notes (Signed)
Speech Language Pathology Daily Session Note  Patient Details  Name: Betty Archer MRN: [id number] Date of Birth: [birth year]  Today's Date: 09/10/2020 SLP Individual Time: [id number] SLP Individual Time Calculation (min): 41 min  Short Term Goals: Week 2: SLP Short Term Goal 1 (Week 2): Patient will demonstrate sustained attention to functional tasks for 10 minutes with Min verbal cues for redirection. SLP Short Term Goal 2 (Week 2): Patient will complete basic problem solving tasks with min A verbal and visual cues. SLP Short Term Goal 3 (Week 2): Patient will self-monitor and correct errors during functional tasks with Min verbal and visual cues. SLP Short Term Goal 4 (Week 2): Patient will recall new and functional information with Mod A multimodal cues.  Skilled Therapeutic Interventions: Patient agreeable to skilled ST intervention with focus on cognitive goals. Patient was oriented x4. Facilitated problem solving through medication management task with identifying medication administration errors in weekly pillbox. Patient completed with 70% accuracy with mod A verbal cues for recalling which medication she was assessing, alternating attention from one medication to the next, and self monitoring errors. Once errors were identified, patient was able to repair errors through generating appropriate solutions with sup-to-min A verbal cues. Patient was able to sustain attention for approximately 15 minutes with min A verbal cues and eventually required mod A verbal cues for redirection for remainder of session. Patient was left in recliner chair with alarm activated and immediate needs within reach at end of session. Continue per current plan of care.      Pain Pain Assessment Pain Scale: 0-10 Pain Score: 0-No pain  Therapy/Group: Individual Therapy  Patty Sermons 09/10/2020, 11:29 AM

## 2020-09-10 NOTE — Progress Notes (Signed)
Occupational Therapy Weekly Progress Note  Patient Details  Name: Betty Archer MRN: [id number] Date of Birth: Oct 23, 1960  Beginning of progress report period: September 04, 2020 End of progress report period: September 10, 2020  Today's Date: 09/10/2020 OT Individual Time: 1520-1605 OT Individual Time Calculation (min): 45 min    Patient has met 3 of 4 short term goals.  Pt has demoed excellent progress this reporting period improving from MOD-MAX A overall for mobility and ADLs to MIN A overall for ADL performance and functional mobility. Pt demo improved return of L extremity strength but continues to have significant sustained/selective attention deficits impacting performance of BADLs  Patient continues to demonstrate the following deficits: muscle weakness, decreased cardiorespiratoy endurance, impaired timing and sequencing, unbalanced muscle activation, and decreased coordination, decreased attention to left, decreased initiation, decreased attention, decreased awareness, decreased problem solving, decreased safety awareness, decreased memory, and delayed processing, and decreased sitting balance, decreased standing balance, decreased postural control, hemiplegia, and decreased balance strategies and therefore will continue to benefit from skilled OT intervention to enhance overall performance with BADL and iADL.  Patient progressing toward long term goals..  Continue plan of care.  OT Short Term Goals Week 1:  OT Short Term Goal 1 (Week 1): Pt will complete STS in prep for standing ADL with consistent mod A in 2/3 trials. OT Short Term Goal 1 - Progress (Week 1): Met OT Short Term Goal 2 (Week 1): Pt will don pants with mod A + AE PRN. OT Short Term Goal 2 - Progress (Week 1): Met OT Short Term Goal 3 (Week 1): Pt will req no more than min VCs to terminate grooming task. OT Short Term Goal 3 - Progress (Week 1): Met OT Short Term Goal 4 (Week 1): Pt will maintain attention to task for at  least 3 min with no more than min VCs. OT Short Term Goal 4 - Progress (Week 1): Progressing toward goal Week 2:  OT Short Term Goal 1 (Week 2): Pt will maintian attention to task for 3 min or greater with no more than min VC OT Short Term Goal 2 (Week 2): Pt will complete grooming with set up in semi distracting environment to dmeo improved selective attention OT Short Term Goal 3 (Week 2): Pt will don pants wiht CGA OT Short Term Goal 4 (Week 2): Pt will complete ambulatory bathroom transfers with CGA  Skilled Therapeutic Interventions/Progress Updates:     Pt received in recliner with no pain. Pt and sister present listening to music. Singing and dancing along to music with most animation this clinician has seen  Therapeutic activity Pt compmeltes standing drumming activity to target coordination, timing, working memory of patterns, balance, selective attention and dual task processing. Pt requires overall MIN A for mobilty during lateral stepping and walking in a circle around the table while drumming to beat of the music. Overall with more complicated patterns pt requires mild increased cuing especially when also coordinating stepping for dual task. Pt demo most difficulty with distraction with other pts walking into gym d/t selective attention deficits.   Pt left at end of session in recliner with exit alarm on, call light in reach and all needs met   Therapy Documentation Precautions:  Precautions Precautions: Fall Precaution Comments: L hemi with LLE>LUE, cognitive deficits, falls asleep easily Restrictions Weight Bearing Restrictions: No General:   Vital Signs: Therapy Vitals Temp: 98.4 F (36.9 C) Pulse Rate: 73 Resp: 16 BP: 98/65 Patient Position (if appropriate):  Lying Oxygen Therapy SpO2: 95 % O2 Device: Room Air Pain:   ADL: ADL Eating: Supervision/safety Where Assessed-Eating: Wheelchair Grooming: Supervision/safety Where Assessed-Grooming: Sitting at  sink Upper Body Bathing: Supervision/safety Where Assessed-Upper Body Bathing: Wheelchair Lower Body Bathing: Minimal assistance Where Assessed-Lower Body Bathing: Bed level Upper Body Dressing: Moderate assistance Where Assessed-Upper Body Dressing: Edge of bed Lower Body Dressing: Dependent Where Assessed-Lower Body Dressing: Edge of bed Toileting: Dependent Where Assessed-Toileting: Bed level Toilet Transfer: Dependent Toilet Transfer Method: Other (comment) (stedy) Tub/Shower Transfer: Not assessed Social research officer, government: Not assessed Vision   Perception    Praxis   Exercises:   Other Treatments:     Therapy/Group: Individual Therapy  Tonny Branch 09/10/2020, 6:47 AM

## 2020-09-10 NOTE — Progress Notes (Signed)
PROGRESS NOTE   Subjective/Complaints:  Up in chair. No new complaints. Denies any pain. Says she's sleeping.   ROS: Patient denies fever, rash, sore throat, blurred vision, nausea, vomiting, diarrhea, cough, shortness of breath or chest pain, joint or back pain, headache, or mood change.      Objective:   No results found. No results for input(s): WBC, HGB, HCT, PLT in the last 72 hours.  No results for input(s): NA, K, CL, CO2, GLUCOSE, BUN, CREATININE, CALCIUM in the last 72 hours.   Intake/Output Summary (Last 24 hours) at 09/10/2020 1102 Last data filed at 09/10/2020 0700 Gross per 24 hour  Intake 660 ml  Output --  Net 660 ml        Physical Exam: Vital Signs Blood pressure 98/65, pulse 73, temperature 98.4 F (36.9 C), resp. rate 16, height '5\' 6"'$  (1.676 m), weight 83 kg, SpO2 95 %.    Constitutional: No distress . Vital signs reviewed. HEENT: EOMI, oral membranes moist Neck: supple Cardiovascular: RRR without murmur. No JVD    Respiratory/Chest: CTA Bilaterally without wheezes or rales. Normal effort    GI/Abdomen: BS +, non-tender, non-distended Ext: no clubbing, cyanosis, or edema Psych: cooperative but remains very flat.  Musculoskeletal:        General: No swelling or tenderness. Normal range of motion.    Cervical back: Neck supple. Skin:      Skin is warm.    Coloration: Skin is not jaundiced or pale. Neurological:    Comments: very alert. More attentive. Fair insight and awareness.   Tracks to left and right. Moves RUE and RLE spontaneously. 4-/5 LUE and   3/5LLE. Sensed pain on left side. No abnormal tone.        Assessment/Plan: 1. Functional deficits which require 3+ hours per day of interdisciplinary therapy in a comprehensive inpatient rehab setting. Physiatrist is providing close team supervision and 24 hour management of active medical problems listed below. Physiatrist and rehab  team continue to assess barriers to discharge/monitor patient progress toward functional and medical goals  Care Tool:  Bathing    Body parts bathed by patient: Right arm, Left arm, Chest, Abdomen, Face, Right upper leg, Left upper leg, Front perineal area, Buttocks, Right lower leg, Left lower leg     Body parts n/a: Front perineal area, Buttocks, Right lower leg, Left lower leg   Bathing assist Assist Level: Minimal Assistance - Patient > 75%     Upper Body Dressing/Undressing Upper body dressing   What is the patient wearing?: Pull over shirt    Upper body assist Assist Level: Supervision/Verbal cueing    Lower Body Dressing/Undressing Lower body dressing      What is the patient wearing?: Pants, Incontinence brief     Lower body assist Assist for lower body dressing: Minimal Assistance - Patient > 75%     Toileting Toileting    Toileting assist Assist for toileting: Minimal Assistance - Patient > 75%     Transfers Chair/bed transfer  Transfers assist     Chair/bed transfer assist level: Minimal Assistance - Patient > 75%     Locomotion Ambulation   Ambulation assist  Assist level: Minimal Assistance - Patient > 75% Assistive device: Hand held assist Max distance: >200 ft   Walk 10 feet activity   Assist  Walk 10 feet activity did not occur: Safety/medical concerns  Assist level: Minimal Assistance - Patient > 75% Assistive device: Hand held assist   Walk 50 feet activity   Assist Walk 50 feet with 2 turns activity did not occur: Safety/medical concerns  Assist level: Minimal Assistance - Patient > 75% Assistive device: Hand held assist    Walk 150 feet activity   Assist Walk 150 feet activity did not occur: Safety/medical concerns  Assist level: Minimal Assistance - Patient > 75% Assistive device: Hand held assist    Walk 10 feet on uneven surface  activity   Assist Walk 10 feet on uneven surfaces activity did not occur:  Safety/medical concerns         Wheelchair     Assist Will patient use wheelchair at discharge?: Yes Type of Wheelchair: Manual Wheelchair activity did not occur: Safety/medical concerns         Wheelchair 50 feet with 2 turns activity    Assist    Wheelchair 50 feet with 2 turns activity did not occur: Safety/medical concerns       Wheelchair 150 feet activity     Assist  Wheelchair 150 feet activity did not occur: Safety/medical concerns       Blood pressure 98/65, pulse 73, temperature 98.4 F (36.9 C), resp. rate 16, height '5\' 6"'$  (1.676 m), weight 83 kg, SpO2 95 %.  Medical Problem List and Plan: 1.  Altered mental status with decreased functional mobility/left lower extremity weakness secondary to large right ACA and small left ACA infarct likely related to recent ACOM aneurysm post coiling and stenting             -patient may shower             -ELOS/Goals: 10-14 days, supervision goals with PT, OT, SLP  -Continue CIR therapies including PT, OT, and SLP  2.  Antithrombotics: -DVT/anticoagulation: SCDs             -antiplatelet therapy: Brilinta 90 mg twice daily, aspirin 81 mg daily 3. Pain Management: Lidoderm patch as directed, Tylenol as needed  7/22- denies pain-   4. Mood: Team to provide emotional support as necessary             -antipsychotic agents: N/A 5. Neuropsych: This patient is not capable of making decisions on her own behalf. 6. Skin/Wound Care: Routine skin checks 7. Fluids/Electrolytes/Nutrition: Routine in and outs               -eating well 8.  Hyperlipidemia.  Lipitor 9.  Hypertension.  Monitor with increased mobility    7/20- BP controlled         10.  History of tobacco abuse.  Counseling  11. Poor initiation/sleepy-   7/21 ritalin increased to '10mg'$  bid to maximize engagement and attention 12. Constipation -Senokot 1 tab daily for daily regimen      LOS: 7 days A FACE TO FACE EVALUATION WAS PERFORMED  [name] 09/10/2020, 11:02 AM

## 2020-09-11 NOTE — Progress Notes (Signed)
PROGRESS NOTE   Subjective/Complaints:  No issues overnight, patient states she has not had therapy today.  ROS: Patient denies CP, SOB, N/V/D   Objective:   No results found. No results for input(s): WBC, HGB, HCT, PLT in the last 72 hours.  No results for input(s): NA, K, CL, CO2, GLUCOSE, BUN, CREATININE, CALCIUM in the last 72 hours.   Intake/Output Summary (Last 24 hours) at 09/11/2020 1102 Last data filed at 09/11/2020 0700 Gross per 24 hour  Intake 592 ml  Output --  Net 592 ml         Physical Exam: Vital Signs Blood pressure 105/67, pulse 97, temperature 98.2 F (36.8 C), temperature source Oral, resp. rate 16, height '5\' 6"'$  (1.676 m), weight 83 kg, SpO2 100 %.  General: No acute distress Mood and affect are appropriate Heart: Regular rate and rhythm no rubs murmurs or extra sounds Lungs: Clear to auscultation, breathing unlabored, no rales or wheezes Abdomen: Positive bowel sounds, soft nontender to palpation, nondistended Extremities: No clubbing, cyanosis, or edema Skin: No evidence of breakdown, no evidence of rash   Musculoskeletal:        General: No swelling or tenderness. Normal range of motion.    Cervical back: Neck supple. Skin:      Skin is warm.    Coloration: Skin is not jaundiced or pale. Neurological:    Comments: very alert. More attentive. Fair insight and awareness.   Tracks to left and right. Moves RUE and RLE 5/5. 4-/5 LUE and   4-/5LLE. Sensed pain on left side. No abnormal tone.        Assessment/Plan: 1. Functional deficits which require 3+ hours per day of interdisciplinary therapy in a comprehensive inpatient rehab setting. Physiatrist is providing close team supervision and 24 hour management of active medical problems listed below. Physiatrist and rehab team continue to assess barriers to discharge/monitor patient progress toward functional and medical goals  Care  Tool:  Bathing    Body parts bathed by patient: Right arm, Left arm, Chest, Abdomen, Face, Right upper leg, Left upper leg, Front perineal area, Buttocks, Right lower leg, Left lower leg     Body parts n/a: Front perineal area, Buttocks, Right lower leg, Left lower leg   Bathing assist Assist Level: Minimal Assistance - Patient > 75%     Upper Body Dressing/Undressing Upper body dressing   What is the patient wearing?: Pull over shirt    Upper body assist Assist Level: Supervision/Verbal cueing    Lower Body Dressing/Undressing Lower body dressing      What is the patient wearing?: Pants, Incontinence brief     Lower body assist Assist for lower body dressing: Minimal Assistance - Patient > 75%     Toileting Toileting    Toileting assist Assist for toileting: Minimal Assistance - Patient > 75%     Transfers Chair/bed transfer  Transfers assist     Chair/bed transfer assist level: Minimal Assistance - Patient > 75%     Locomotion Ambulation   Ambulation assist      Assist level: Minimal Assistance - Patient > 75% Assistive device: Hand held assist Max distance: >200 ft  Walk 10 feet activity   Assist  Walk 10 feet activity did not occur: Safety/medical concerns  Assist level: Minimal Assistance - Patient > 75% Assistive device: Hand held assist   Walk 50 feet activity   Assist Walk 50 feet with 2 turns activity did not occur: Safety/medical concerns  Assist level: Minimal Assistance - Patient > 75% Assistive device: Hand held assist    Walk 150 feet activity   Assist Walk 150 feet activity did not occur: Safety/medical concerns  Assist level: Minimal Assistance - Patient > 75% Assistive device: Hand held assist    Walk 10 feet on uneven surface  activity   Assist Walk 10 feet on uneven surfaces activity did not occur: Safety/medical concerns         Wheelchair     Assist Will patient use wheelchair at discharge?:  Yes Type of Wheelchair: Manual Wheelchair activity did not occur: Safety/medical concerns         Wheelchair 50 feet with 2 turns activity    Assist    Wheelchair 50 feet with 2 turns activity did not occur: Safety/medical concerns       Wheelchair 150 feet activity     Assist  Wheelchair 150 feet activity did not occur: Safety/medical concerns       Blood pressure 105/67, pulse 97, temperature 98.2 F (36.8 C), temperature source Oral, resp. rate 16, height '5\' 6"'$  (1.676 m), weight 83 kg, SpO2 100 %.  Medical Problem List and Plan: 1.  Altered mental status with decreased functional mobility/left lower extremity weakness secondary to large right ACA and small left ACA infarct likely related to recent ACOM aneurysm post coiling and stenting             -patient may shower             -ELOS/Goals: 10-14 days, supervision goals with PT, OT, SLP  -Continue CIR therapies including PT, OT, and SLP  2.  Antithrombotics: -DVT/anticoagulation: SCDs             -antiplatelet therapy: Brilinta 90 mg twice daily, aspirin 81 mg daily 3. Pain Management: Lidoderm patch as directed, Tylenol as needed  7/22- denies pain-   4. Mood: Team to provide emotional support as necessary             -antipsychotic agents: N/A 5. Neuropsych: This patient is not capable of making decisions on her own behalf. 6. Skin/Wound Care: Routine skin checks 7. Fluids/Electrolytes/Nutrition: Routine in and outs               -eating well 8.  Hyperlipidemia.  Lipitor 9.  Hypertension.  Monitor with increased mobility     Vitals:   09/10/20 1947 09/11/20 0353  BP: 116/71 105/67  Pulse: 85 97  Resp: 15 16  Temp: 98.5 F (36.9 C) 98.2 F (36.8 C)  SpO2: 100% 100%         Controlled, 09/11/2020 10.  History of tobacco abuse.  Counseling  11. Poor initiation/sleepy-   7/21 ritalin increased to '10mg'$  bid to maximize engagement and attention 12. Constipation -Senokot 1 tab daily for daily regimen       LOS: 8 days A FACE TO FACE EVALUATION WAS PERFORMED  Charlett Blake 09/11/2020, 11:02 AM

## 2020-09-12 NOTE — Progress Notes (Signed)
Speech Language Pathology Daily Session Note  Patient Details  Name: Betty Archer MRN: [id number] Date of Birth: Jul 11, 1960  Today's Date: 09/12/2020 SLP Individual Time: [id number] SLP Individual Time Calculation (min): 30 min  Short Term Goals: Week 2: SLP Short Term Goal 1 (Week 2): Patient will demonstrate sustained attention to functional tasks for 10 minutes with Min verbal cues for redirection. SLP Short Term Goal 2 (Week 2): Patient will complete basic problem solving tasks with min A verbal and visual cues. SLP Short Term Goal 3 (Week 2): Patient will self-monitor and correct errors during functional tasks with Min verbal and visual cues. SLP Short Term Goal 4 (Week 2): Patient will recall new and functional information with Mod A multimodal cues.  Skilled Therapeutic Interventions:  Pt was seen for skilled ST targeting cognitive goals.  Upon arrival, pt was working on a word search puzzle but was agreeable to participating in treatment.  Pt required mod-max assist question cues to recall activities from previous treatment sessions but she denies any overt cognitive changes.  During conversations with SLP, pt required min verbal cues for redirection to topic.  SLP introduced the concept of incorporating a memory notebook into therapy sessions to maximize carryover of daily information.  Pt recorded activities from AM OT and speech therapy appointments as well as visits with family members into memory notebook with min assist for organization of information.  Pt wrote in her notebook that she was waiting on her son to come do her hair and when SLP asked what she wanted her son to do with her hair she stated "Dye it blond."  Pt was left in recliner with chair alarm set and call bell within reach.   Pain Pain Assessment Pain Scale: 0-10 Pain Score: 0-No pain  Therapy/Group: Individual Therapy  Aric Jost, Selinda Orion 09/12/2020, 11:06 AM

## 2020-09-12 NOTE — Progress Notes (Signed)
Occupational Therapy Session Note  Patient Details  Name: Betty Archer MRN: [id number] Date of Birth: 1960-10-13  Today's Date: 09/12/2020 OT Individual Time: [id number] OT Individual Time Calculation (min): 57 min   Skilled Therapeutic Interventions/Progress Updates:    Pt greeted in bed, requesting to shower. Supervision for bed mobility and Min A for stand pivot<w/c without AD. CGA for stand pivot<TTB while using the grab bar. She then bathed at sit<stand level. Noted several instances of posterior LOBs onto the bench when she removed both hands from the grab bar to engage in bathing activity while standing. Min cues for sequencing, able to use figure 4 position bilaterally to wash feet. She then ambulated a short distance to the toilet using grab bars with Min A. Dressing completed sit<stand from toilet without AD, CGA for sit<stands and CGA-Min A for dynamic standing balance. Note that pt wanted to wear tight leggings, Mod A to don them. Min A to fasten buttons on shirt. Note that pt exhibits perseverative tendencies and deficits with sustained attention to task, requires cues for redirection and attention. Also decreased insight into deficits, pt reporting she wanted her son to take her out to get her nails done today, stated if she had her car here, she'd "be gone." Oral care/grooming tasks completed while seated at the sink afterwards, pt refusing to stand when provided with the opportunity. Min A for stand pivot<recliner at close of session, all needs within reach and safety belt fastened. Tx focus placed on ADL retraining, functional cognition, sit<stands, standing balance, and Lt NMR.   Therapy Documentation Precautions:  Precautions Precautions: Fall Precaution Comments: L hemi with LLE>LUE, cognitive deficits, falls asleep easily Restrictions Weight Bearing Restrictions: No  Pain: ingrown toenail pain/soreness, notified RN Pain Assessment Pain Scale: 0-10 Pain Score: 0-No  pain ADL: ADL Eating: Supervision/safety Where Assessed-Eating: Wheelchair Grooming: Supervision/safety Where Assessed-Grooming: Sitting at sink Upper Body Bathing: Supervision/safety Where Assessed-Upper Body Bathing: Wheelchair Lower Body Bathing: Minimal assistance Where Assessed-Lower Body Bathing: Bed level Upper Body Dressing: Moderate assistance Where Assessed-Upper Body Dressing: Edge of bed Lower Body Dressing: Dependent Where Assessed-Lower Body Dressing: Edge of bed Toileting: Dependent Where Assessed-Toileting: Bed level Toilet Transfer: Dependent Toilet Transfer Method: Other (comment) (stedy) Tub/Shower Transfer: Not assessed Walk-In Shower Transfer: Not assessed      Therapy/Group: Individual Therapy  [name] A Cam Harnden 09/12/2020, 12:39 PM

## 2020-09-12 NOTE — Progress Notes (Signed)
Patient refused toileting this pm asked several times patient family in room while visiting. Will pass this information to night shift nurse to follow up with pt

## 2020-09-13 MED ORDER — METHYLPHENIDATE HCL 5 MG PO TABS
15.0000 mg | ORAL_TABLET | Freq: Two times a day (BID) | ORAL | Status: DC
  Administered 2020-09-13 – 2020-09-24 (×23): 15 mg via ORAL
  Filled 2020-09-13 (×23): qty 3

## 2020-09-13 NOTE — Progress Notes (Signed)
Occupational Therapy Session Note  Patient Details  Name: Betty Archer MRN: [id number] Date of Birth: 1960-03-11  Today's Date: 09/13/2020 OT Individual Time: 1400-1425 OT Individual Time Calculation (min): 25 min    Short Term Goals: Week 1:  OT Short Term Goal 1 (Week 1): Pt will complete STS in prep for standing ADL with consistent mod A in 2/3 trials. OT Short Term Goal 1 - Progress (Week 1): Met OT Short Term Goal 2 (Week 1): Pt will don pants with mod A + AE PRN. OT Short Term Goal 2 - Progress (Week 1): Met OT Short Term Goal 3 (Week 1): Pt will req no more than min VCs to terminate grooming task. OT Short Term Goal 3 - Progress (Week 1): Met OT Short Term Goal 4 (Week 1): Pt will maintain attention to task for at least 3 min with no more than min VCs. OT Short Term Goal 4 - Progress (Week 1): Progressing toward goal Week 2:  OT Short Term Goal 1 (Week 2): Pt will maintian attention to task for 3 min or greater with no more than min VC OT Short Term Goal 2 (Week 2): Pt will complete grooming with set up in semi distracting environment to dmeo improved selective attention OT Short Term Goal 3 (Week 2): Pt will don pants wiht CGA OT Short Term Goal 4 (Week 2): Pt will complete ambulatory bathroom transfers with CGA   Skilled Therapeutic Interventions/Progress Updates:    Pt greeted at time of session sitting up in recliner, just finished PT session with son [name] present in room. Pt declined toileting/ADL politely but agreeable to OT session in gym. No pain throughout. Ambulated room <> main gym with RW and no rest breaks. Decreased safety with transfers needing cues for RW and hand/foot placement but otherwise CGA/close supervision. Focus on cognitive task of alternating attention between dynamic seated ball toss + following alphabet in order, able to complete 1 trial with 100% accuracy A-Z with ball toss but on second round of same activity and instructions, having significant  difficulty following order of letters and sequencing toss/letters. Changed activity to ball toss + word finding activity with improved performance. After walking back to room, set up with alarm on call bell in reach and telesitter as well as son present.   Therapy Documentation Precautions:  Precautions Precautions: Fall Precaution Comments: L hemi with LLE>LUE, cognitive deficits, falls asleep easily Restrictions Weight Bearing Restrictions: No     Therapy/Group: Individual Therapy  Viona Gilmore 09/13/2020, 12:52 PM

## 2020-09-13 NOTE — Progress Notes (Signed)
Occupational Therapy Session Note  Patient Details  Name: Betty Archer MRN: [id number] Date of Birth: [birth year]  Today's Date: 09/13/2020 OT Individual Time: 7005-2591 OT Individual Time Calculation (min): 54 min    Short Term Goals: Week 2:  OT Short Term Goal 1 (Week 2): Pt will maintian attention to task for 3 min or greater with no more than min VC OT Short Term Goal 2 (Week 2): Pt will complete grooming with set up in semi distracting environment to dmeo improved selective attention OT Short Term Goal 3 (Week 2): Pt will don pants wiht CGA OT Short Term Goal 4 (Week 2): Pt will complete ambulatory bathroom transfers with CGA  Skilled Therapeutic Interventions/Progress Updates:    Pt sitting in recliner with no c/o pain. Pt completed ambulatory transfer into the bathroom with CGA overall but occasional min A for balance perturbations to the L. Pt was incontinent of urine in her brief and had continent void on toilet as well. She transferred into shower with min guard. Subtle improvements in functional problem solving this session. Pt completed majority of shower standing with at least one UE on the grab bar and posterior support from shower chair. She required min A several times for balance recovery with posterior LOB. She demonstrated some perseverative washing of the shower itself and then the sink. UB dressing with supervision, min A LB. Standing level oral care with CGA. Pt demonstrated STM deficits, unaware she had applied deodorant already and required mod cueing for completion of memory notebook. Pt was left sitting up in the recliner with all needs met, chair alarm set.   Therapy Documentation Precautions:  Precautions Precautions: Fall Precaution Comments: L hemi with LLE>LUE, cognitive deficits, falls asleep easily Restrictions Weight Bearing Restrictions: No   Therapy/Group: Individual Therapy  Curtis Sites 09/13/2020, 6:05 AM

## 2020-09-13 NOTE — Progress Notes (Signed)
Speech Language Pathology Daily Session Note  Patient Details  Name: ZALIYA PEEPLES MRN: [id number] Date of Birth: [birth year]  Today's Date: 09/13/2020 SLP Individual Time: 0715-0800 SLP Individual Time Calculation (min): 45 min  Short Term Goals: Week 2: SLP Short Term Goal 1 (Week 2): Patient will demonstrate sustained attention to functional tasks for 10 minutes with Min verbal cues for redirection. SLP Short Term Goal 2 (Week 2): Patient will complete basic problem solving tasks with min A verbal and visual cues. SLP Short Term Goal 3 (Week 2): Patient will self-monitor and correct errors during functional tasks with Min verbal and visual cues. SLP Short Term Goal 4 (Week 2): Patient will recall new and functional information with Mod A multimodal cues.  Skilled Therapeutic Interventions: Skilled treatment session focused on cognitive goals. Upon arrival, patient was awake in bed and agreeable to participate. With extra time, patient initiated getting out of the bed to the recliner. Min A was needed due to backwards lean while standing. SLP facilitated session by providing extra time and overall Min A verbal cues for problem solving and error awareness during a complex calendar task. Max A verbal and visual cues were needed for organization of information within the task. Patient required assistance to locate her memory notebook and required Mod verbal cues for utilization for recall of previous events as well as recording events from morning SLP session. Patient left upright in recliner with alarm on and all needs within reach. Continue with current plan of care.      Pain Pain Assessment Pain Scale: 0-10 Pain Score: 0-No pain  Therapy/Group: Individual Therapy  Amadi Frady 09/13/2020, 8:03 AM

## 2020-09-13 NOTE — Progress Notes (Signed)
Physical Therapy Weekly Progress Note  Patient Details  Name: Betty Archer MRN: [id number] Date of Birth: 08/15/1960  Beginning of progress report period: September 04, 2020 End of progress report period: September 13, 2020  Today's Date: 09/13/2020 PT Individual Time: 1305-1400 PT Individual Time Calculation (min): 55 min   Patient has met 4 of 4 short term goals.  Patient performs all mobility with min A-CGA, assist provided due to decreased attention and external distraction. Patient has been impulsive, spontaneously standing in the room and during therapy sessions without assist. Patient able to use RW with improved safety this week. Ambulated >750 ft with CGA over level surfaces and performed 16 steps with B rails with CGA this week. Improved Berg Balance Test score from 13 to 33/56 this week. Continues to present as high fall risk with present score.   Patient continues to demonstrate the following deficits muscle weakness, decreased cardiorespiratoy endurance, decreased coordination and decreased motor planning, decreased attention to left, decreased attention, decreased awareness, decreased problem solving, decreased safety awareness, and decreased memory, and decreased sitting balance, decreased standing balance, decreased postural control, decreased balance strategies, and difficulty maintaining precautions and therefore will continue to benefit from skilled PT intervention to increase functional independence with mobility.  Patient progressing toward long term goals..  Continue plan of care.  PT Short Term Goals Week 1:  PT Short Term Goal 1 (Week 1): Pt will performbed mobility with Min A. PT Short Term Goal 1 - Progress (Week 1): Met PT Short Term Goal 2 (Week 1): Pt will perform STS transfers with overall Mod A. PT Short Term Goal 2 - Progress (Week 1): Met PT Short Term Goal 3 (Week 1): Pt will perform seat-to-seat transfers with overall Mod A. PT Short Term Goal 3 - Progress (Week 1):  Met PT Short Term Goal 4 (Week 1): Pt will initiate gait training and complete 25 feet with Mod A using LRAD. PT Short Term Goal 4 - Progress (Week 1): Met PT Short Term Goal 5 (Week 1): Pt will initiate w/c mobility training. PT Short Term Goal 5 - Progress (Week 1): Discontinued (comment) (patient is a functional ambulator) Week 2:  PT Short Term Goal 1 (Week 2): Patient will perform basic transfers with supervision >75% of the time. PT Short Term Goal 2 (Week 2): Patient will perform 16 steps using 1 rail with min A. PT Short Term Goal 3 (Week 2): Patient will improve Berg Balance scale by at least 5 point sto meet the MCID.  Skilled Therapeutic Interventions/Progress Updates:     Patient in recliner with her son in the room upon PT arrival. Patient alert and agreeable to PT session. Patient denied pain during session.  Therapeutic Activity: Transfers: Patient performed sit to/from stand with CGA-supervision with and without RW. Provided verbal cues for hand placement on RW, controlled descent for safety, and management of impulsivity with spontaneous standing x2 during session.  Gait Training:  Patient ambulated ~150 feet without AD with min A and 2 LOB requiring mod A with distraction. She then ambulated 778 feet using RW with close supervision and min A x2 due to R LOB due to distraction. Ambulated with decreased gait speed, decreased step length and height, increased B hip and knee flexion in stance, and mild forward trunk lean. Initially patient with veering to the R with RW, improved with increased time with device. Provided verbal cues for increased gait speed, step length and height, erect posture, and attention to task to  reduce LOB. 6 Min Walk Test:  Instructed patient to ambulate as quickly and as safely as possible for 6 minutes using LRAD. Patient was allowed to take standing rest breaks without stopping the test, but if the patient required a sitting rest break the clock would be  stopped and the test would be over.  Results: 572 feet (174 meters, Avg speed 0.48 m/s) using a RW, RPE 3-4/10 at end of test. Results indicate that the patient has reduced endurance with ambulation compared to age matched norms (538 meters).   Neuromuscular Re-ed: Patient performed the Berg Balance Test: Patient demonstrates increased fall risk as noted by score of   /56 on Berg Balance Scale.  (<36= high risk for falls, close to 100%; 37-45 significant >80%; 46-51 moderate >50%; 52-55 lower >25%) Improved from 13/56 on 7/19. Educated on results and interpretation. Reinforced need for assist with all mobility due to impulsivity with getting up without assist in the room.  Educated patient and her son on results of testing and patient's progress at end of session. Reinforced calling for assistance to get up in the room and fall risk with patient throughout session. Patient stated understanding.   Patient in recliner with her son in the room handed off to NT at end of session .   Therapy Documentation Precautions:  Precautions Precautions: Fall Precaution Comments: L hemi with LLE>LUE, cognitive deficits, falls asleep easily Restrictions Weight Bearing Restrictions: No Balance: Balance Balance Assessed: Yes Standardized Balance Assessment Standardized Balance Assessment: Berg Balance Test Berg Balance Test Sit to Stand: Able to stand without using hands and stabilize independently Standing Unsupported: Able to stand safely 2 minutes Sitting with Back Unsupported but Feet Supported on Floor or Stool: Able to sit safely and securely 2 minutes Stand to Sit: Controls descent by using hands Transfers: Able to transfer with verbal cueing and /or supervision Standing Unsupported with Eyes Closed: Able to stand 10 seconds with supervision Standing Ubsupported with Feet Together: Able to place feet together independently and stand for 1 minute with supervision From Standing, Reach Forward with  Outstretched Arm: Can reach forward >5 cm safely (2") From Standing Position, Pick up Object from Floor: Able to pick up shoe, needs supervision From Standing Position, Turn to Look Behind Over each Shoulder: Needs supervision when turning Turn 360 Degrees: Needs assistance while turning Standing Unsupported, Alternately Place Feet on Step/Stool: Able to complete >2 steps/needs minimal assist Standing Unsupported, One Foot in Front: Needs help to step but can hold 15 seconds Standing on One Leg: Able to lift leg independently and hold equal to or more than 3 seconds Total Score: 33/56    Therapy/Group: Individual Therapy  Imaad Reuss L Norvell Caswell PT, DPT  09/13/2020, 5:09 PM

## 2020-09-13 NOTE — Progress Notes (Signed)
PROGRESS NOTE   Subjective/Complaints:  Pt up in chair. No new issues. Trying to figure out password for her google acct  ROS: Limited due to cognitive/behavioral   Objective:   No results found. No results for input(s): WBC, HGB, HCT, PLT in the last 72 hours.  No results for input(s): NA, K, CL, CO2, GLUCOSE, BUN, CREATININE, CALCIUM in the last 72 hours.   Intake/Output Summary (Last 24 hours) at 09/13/2020 0851 Last data filed at 09/13/2020 0810 Gross per 24 hour  Intake 360 ml  Output --  Net 360 ml        Physical Exam: Vital Signs Blood pressure 116/75, pulse 82, temperature (!) 97.5 F (36.4 C), temperature source Oral, resp. rate 16, height '5\' 6"'$  (1.676 m), weight 83 kg, SpO2 100 %.  Constitutional: No distress . Vital signs reviewed. HEENT: EOMI, oral membranes moist Neck: supple Cardiovascular: RRR without murmur. No JVD    Respiratory/Chest: CTA Bilaterally without wheezes or rales. Normal effort    GI/Abdomen: BS +, non-tender, non-distended Ext: no clubbing, cyanosis, or edema Psych: flat, but cooperative  Skin: No evidence of breakdown, no evidence of rash Musculoskeletal:        General: No swelling or tenderness. Normal range of motion.    Cervical back: Neck supple. Neurological:    Comments: very alert. More attentive but still only engages when first spoken to. Fair insight and awareness.   Tracks to left and right. Moves RUE and RLE 5/5. 4-/5 LUE and   4-/5LLE. Sensed pain on left side. No abnormal tone.        Assessment/Plan: 1. Functional deficits which require 3+ hours per day of interdisciplinary therapy in a comprehensive inpatient rehab setting. Physiatrist is providing close team supervision and 24 hour management of active medical problems listed below. Physiatrist and rehab team continue to assess barriers to discharge/monitor patient progress toward functional and medical  goals  Care Tool:  Bathing    Body parts bathed by patient: Right arm, Left arm, Chest, Abdomen, Face, Right upper leg, Left upper leg, Front perineal area, Buttocks, Right lower leg, Left lower leg     Body parts n/a: Front perineal area, Buttocks, Right lower leg, Left lower leg   Bathing assist Assist Level: Contact Guard/Touching assist     Upper Body Dressing/Undressing Upper body dressing   What is the patient wearing?: Button up shirt    Upper body assist Assist Level: Minimal Assistance - Patient > 75%    Lower Body Dressing/Undressing Lower body dressing      What is the patient wearing?: Pants, Incontinence brief     Lower body assist Assist for lower body dressing: Moderate Assistance - Patient 50 - 74% (tight leggings)     Toileting Toileting Toileting Activity did not occur (Clothing management and hygiene only): N/A (no void or bm)  Toileting assist Assist for toileting: Minimal Assistance - Patient > 75%     Transfers Chair/bed transfer  Transfers assist     Chair/bed transfer assist level: Minimal Assistance - Patient > 75%     Locomotion Ambulation   Ambulation assist      Assist level: Minimal Assistance -  Patient > 75% Assistive device: Hand held assist Max distance: >200 ft   Walk 10 feet activity   Assist  Walk 10 feet activity did not occur: Safety/medical concerns  Assist level: Minimal Assistance - Patient > 75% Assistive device: Hand held assist   Walk 50 feet activity   Assist Walk 50 feet with 2 turns activity did not occur: Safety/medical concerns  Assist level: Minimal Assistance - Patient > 75% Assistive device: Hand held assist    Walk 150 feet activity   Assist Walk 150 feet activity did not occur: Safety/medical concerns  Assist level: Minimal Assistance - Patient > 75% Assistive device: Hand held assist    Walk 10 feet on uneven surface  activity   Assist Walk 10 feet on uneven surfaces activity  did not occur: Safety/medical concerns         Wheelchair     Assist Will patient use wheelchair at discharge?: Yes Type of Wheelchair: Manual Wheelchair activity did not occur: Safety/medical concerns         Wheelchair 50 feet with 2 turns activity    Assist    Wheelchair 50 feet with 2 turns activity did not occur: Safety/medical concerns       Wheelchair 150 feet activity     Assist  Wheelchair 150 feet activity did not occur: Safety/medical concerns       Blood pressure 116/75, pulse 82, temperature (!) 97.5 F (36.4 C), temperature source Oral, resp. rate 16, height '5\' 6"'$  (1.676 m), weight 83 kg, SpO2 100 %.  Medical Problem List and Plan: 1.  Altered mental status with decreased functional mobility/left lower extremity weakness secondary to large right ACA and small left ACA infarct likely related to recent ACOM aneurysm post coiling and stenting             -patient may shower             -ELOS/Goals: 10-14 days, supervision goals with PT, OT, SLP  -Continue CIR therapies including PT, OT, and SLP   2.  Antithrombotics: -DVT/anticoagulation: SCDs             -antiplatelet therapy: Brilinta 90 mg twice daily, aspirin 81 mg daily 3. Pain Management: Lidoderm patch as directed, Tylenol as needed  7/25- denies pain-   4. Mood: Team to provide emotional support as necessary             -antipsychotic agents: N/A 5. Neuropsych: This patient is not capable of making decisions on her own behalf. 6. Skin/Wound Care: Routine skin checks 7. Fluids/Electrolytes/Nutrition: Routine in and outs               -eating well 8.  Hyperlipidemia.  Lipitor 9.  Hypertension.  Monitor with increased mobility     Vitals:   09/12/20 1950 09/13/20 0600  BP: (!) 107/45 116/75  Pulse: 80 82  Resp: 18 16  Temp: 98.6 F (37 C) (!) 97.5 F (36.4 C)  SpO2: 100% 100%         Controlled, 09/13/2020 10.  History of tobacco abuse.  Counseling  11. Poor  initiation/concentration-   7/25 increase ritalin to '15mg'$  12. Constipation -Senokot 1 tab daily for daily regimen      LOS: 10 days A FACE TO FACE EVALUATION WAS PERFORMED  [name] 09/13/2020, 8:51 AM

## 2020-09-14 NOTE — Plan of Care (Signed)
  Problem: RH Problem Solving Goal: LTG Patient will demonstrate problem solving for (SLP) Description: LTG:  Patient will demonstrate problem solving for basic/complex daily situations with cues  (SLP) Flowsheets (Taken 09/14/2020 [phone]) LTG Patient will demonstrate problem solving for: Minimal Assistance - Patient > 75% Note: Goal downgraded due to slow progress   Problem: RH Memory Goal: LTG Patient will demonstrate ability for day to day (SLP) Description: LTG:   Patient will demonstrate ability for day to day recall/carryover during cognitive/linguistic activities with assist  (SLP) Flowsheets (Taken 09/14/2020 0648) LTG: Patient will demonstrate ability for day to day recall/carryover during cognitive/linguistic activities with assist (SLP): Minimal Assistance - Patient > 75% Note: Goal downgraded due to slow progress Goal: LTG Patient will use memory compensatory aids to (SLP) Description: LTG:  Patient will use memory compensatory aids to recall biographical/new, daily complex information with cues (SLP) Flowsheets (Taken 09/14/2020 0648) LTG: Patient will use memory compensatory aids to (SLP): Minimal Assistance - Patient > 75% Note: Goal downgraded due to slow progress   Problem: RH Attention Goal: LTG Patient will demonstrate this level of attention during functional activites (SLP) Description: LTG:  Patient will will demonstrate this level of attention during functional activites (SLP) Flowsheets (Taken 09/14/2020 [phone]) Patient will demonstrate during cognitive/linguistic activities the attention type of: Selective LTG: Patient will demonstrate this level of attention during cognitive/linguistic activities with assistance of (SLP): Minimal Assistance - Patient > 75% Number of minutes patient will demonstrate attention during cognitive/linguistic activities: 30 Note: Goal downgraded due to slow progress   Problem: RH Awareness Goal: LTG: Patient will demonstrate awareness during  functional activites type of (SLP) Description: LTG: Patient will demonstrate awareness during functional activites type of (SLP) Flowsheets (Taken 09/14/2020 0648) LTG: Patient will demonstrate awareness during cognitive/linguistic activities with assistance of (SLP): Minimal Assistance - Patient > 75% Note: Goal downgraded due to slow progress

## 2020-09-14 NOTE — Progress Notes (Signed)
Occupational Therapy Session Note  Patient Details  Name: Betty Archer MRN: [id number] Date of Birth: 1960/05/08  Today's Date: 09/14/2020 OT Individual Time: 1300-1415 OT Individual Time Calculation (min): 75 min    Short Term Goals: Week 2:  OT Short Term Goal 1 (Week 2): Pt will maintian attention to task for 3 min or greater with no more than min VC OT Short Term Goal 2 (Week 2): Pt will complete grooming with set up in semi distracting environment to dmeo improved selective attention OT Short Term Goal 3 (Week 2): Pt will don pants wiht CGA OT Short Term Goal 4 (Week 2): Pt will complete ambulatory bathroom transfers with CGA  Skilled Therapeutic Interventions/Progress Updates:    Pt sitting in recliner with no c/o pain, her son and sister present. Pt agreeable to take shower. Pt completed amublatory transfer into the bathroom with CGA. She completed toileting tasks with CGA overall, voiding urine. She transferred into shower with supervision. She completed all bathing standing with occasional min A for posterior LOB. She was able to use grab bar appropriately with alternating BUE. She donned shirt following shower with supervision. Mod A to don pants d/t pants being very tight. Pt completed oral care in standing with cueing required for termination. She took a seated rest break before completing 200 ft of functional mobility ot the therapy gym with CGA, using RW. In the gym pt completed visual scanning and dynamic standing balance activity, activity graded to increase visual scanning demands and removal of RW for increased balance demands. Pt required very close CGA for reaching distally to the floor with UE support on the rail. Pt returned to her room and was left sitting in the recliner, asleep quickly. Family with questions re sleeping at night and passed off questioning to RN to verify sleep quality tonight. Chair alarm set.   Therapy Documentation Precautions:   Precautions Precautions: Fall Precaution Comments: L hemi with LLE>LUE, cognitive deficits, falls asleep easily Restrictions Weight Bearing Restrictions: No  Therapy/Group: Individual Therapy  Curtis Sites 09/14/2020, 6:40 AM

## 2020-09-14 NOTE — Progress Notes (Signed)
Speech Language Pathology Daily Session Note  Patient Details  Name: Betty Archer MRN: [id number] Date of Birth: Feb 03, 1961  Today's Date: 09/14/2020 SLP Individual Time: 0820-0900 SLP Individual Time Calculation (min): 40 min  Short Term Goals: Week 2: SLP Short Term Goal 1 (Week 2): Patient will demonstrate sustained attention to functional tasks for 10 minutes with Min verbal cues for redirection. SLP Short Term Goal 2 (Week 2): Patient will complete basic problem solving tasks with min A verbal and visual cues. SLP Short Term Goal 3 (Week 2): Patient will self-monitor and correct errors during functional tasks with Min verbal and visual cues. SLP Short Term Goal 4 (Week 2): Patient will recall new and functional information with Mod A multimodal cues.  Skilled Therapeutic Interventions: Skilled treatment session focused on cognitive goals. Upon arrival, patient was awake and reported her bed was wet. It appears her bed had been soiled for an extended period of time but patient never called for assistance. SLP assisted in donning a new brief with Mod verbal cues needed for problem solving and safety due to impulsivity. Patient attempted to utilize her memory notebook yesterday but instead used it like a personal diary and wrote multiple continuous strings of thought that were unrelated to therapy or her current hospitalization. SLP attempted to help patient organize her memory notebook to record today's events with minimal success. Patient began to push the table away and asked what was wrongs, she reported that she needed to use the bathroom. Patient ambulated to the bathroom and had already been incontinent. Patient perseverative on cleaning the sink and required Max verbal cues to redirect. Patient left upright in recliner with alarm on and all needs within reach. Continue with current plan of care.      Pain No/Denies Pain  Therapy/Group: Individual Therapy  Evann Erazo, [place] 09/14/2020,  12:14 PM

## 2020-09-14 NOTE — Progress Notes (Signed)
PROGRESS NOTE   Subjective/Complaints:  No new issues. Slept fairly well last night.   ROS: Limited due to cognitive/behavioral     Objective:   No results found. No results for input(s): WBC, HGB, HCT, PLT in the last 72 hours.  No results for input(s): NA, K, CL, CO2, GLUCOSE, BUN, CREATININE, CALCIUM in the last 72 hours.   Intake/Output Summary (Last 24 hours) at 09/14/2020 0928 Last data filed at 09/14/2020 0853 Gross per 24 hour  Intake 540 ml  Output --  Net 540 ml        Physical Exam: Vital Signs Blood pressure 109/69, pulse 76, temperature 98.4 F (36.9 C), temperature source Oral, resp. rate 18, height '5\' 6"'$  (1.676 m), weight 83 kg, SpO2 100 %.  Constitutional: No distress . Vital signs reviewed. HEENT: EOMI, oral membranes moist Neck: supple Cardiovascular: RRR without murmur. No JVD    Respiratory/Chest: CTA Bilaterally without wheezes or rales. Normal effort    GI/Abdomen: BS +, non-tender, non-distended Ext: no clubbing, cyanosis, or edema Psych: flat, does not engaged unless addressed directly.   Skin: No evidence of breakdown, no evidence of rash Musculoskeletal:        General: No swelling or tenderness. Normal range of motion.    Cervical back: Neck supple. Neurological:    Comments: very alert.distracted. follows basic commands.     Tracks to left and right. Moves RUE and RLE 5/5. 4-/5 LUE and   4-/5LLE. Senses pain on all 4. No abnormal tone.        Assessment/Plan: 1. Functional deficits which require 3+ hours per day of interdisciplinary therapy in a comprehensive inpatient rehab setting. Physiatrist is providing close team supervision and 24 hour management of active medical problems listed below. Physiatrist and rehab team continue to assess barriers to discharge/monitor patient progress toward functional and medical goals  Care Tool:  Bathing    Body parts bathed by patient:  Right arm, Left arm, Chest, Abdomen, Face, Right upper leg, Left upper leg, Front perineal area, Buttocks, Right lower leg, Left lower leg     Body parts n/a: Front perineal area, Buttocks, Right lower leg, Left lower leg   Bathing assist Assist Level: Contact Guard/Touching assist     Upper Body Dressing/Undressing Upper body dressing   What is the patient wearing?: Button up shirt    Upper body assist Assist Level: Minimal Assistance - Patient > 75%    Lower Body Dressing/Undressing Lower body dressing      What is the patient wearing?: Pants, Incontinence brief     Lower body assist Assist for lower body dressing: Moderate Assistance - Patient 50 - 74% (tight leggings)     Toileting Toileting Toileting Activity did not occur (Clothing management and hygiene only): N/A (no void or bm)  Toileting assist Assist for toileting: Minimal Assistance - Patient > 75%     Transfers Chair/bed transfer  Transfers assist     Chair/bed transfer assist level: Minimal Assistance - Patient > 75%     Locomotion Ambulation   Ambulation assist      Assist level: Minimal Assistance - Patient > 75% Assistive device: Hand held assist Max distance: >  200 ft   Walk 10 feet activity   Assist  Walk 10 feet activity did not occur: Safety/medical concerns  Assist level: Minimal Assistance - Patient > 75% Assistive device: Hand held assist   Walk 50 feet activity   Assist Walk 50 feet with 2 turns activity did not occur: Safety/medical concerns  Assist level: Minimal Assistance - Patient > 75% Assistive device: Hand held assist    Walk 150 feet activity   Assist Walk 150 feet activity did not occur: Safety/medical concerns  Assist level: Minimal Assistance - Patient > 75% Assistive device: Hand held assist    Walk 10 feet on uneven surface  activity   Assist Walk 10 feet on uneven surfaces activity did not occur: Safety/medical concerns          Wheelchair     Assist Will patient use wheelchair at discharge?: Yes Type of Wheelchair: Manual Wheelchair activity did not occur: Safety/medical concerns         Wheelchair 50 feet with 2 turns activity    Assist    Wheelchair 50 feet with 2 turns activity did not occur: Safety/medical concerns       Wheelchair 150 feet activity     Assist  Wheelchair 150 feet activity did not occur: Safety/medical concerns       Blood pressure 109/69, pulse 76, temperature 98.4 F (36.9 C), temperature source Oral, resp. rate 18, height '5\' 6"'$  (1.676 m), weight 83 kg, SpO2 100 %.  Medical Problem List and Plan: 1.  Altered mental status with decreased functional mobility/left lower extremity weakness secondary to large right ACA and small left ACA infarct likely related to recent ACOM aneurysm post coiling and stenting             -patient may shower             -ELOS/Goals: 10-14 days, supervision goals with PT, OT, SLP  -Continue CIR therapies including PT, OT, and SLP , team conf  2.  Antithrombotics: -DVT/anticoagulation: SCDs             -antiplatelet therapy: Brilinta 90 mg twice daily, aspirin 81 mg daily 3. Pain Management: Lidoderm patch as directed, Tylenol as needed  7/26- denies pain-   4. Mood: Team to provide emotional support as necessary             -antipsychotic agents: N/A 5. Neuropsych: This patient is not capable of making decisions on her own behalf. 6. Skin/Wound Care: Routine skin checks 7. Fluids/Electrolytes/Nutrition: Routine in and outs               -eating well 8.  Hyperlipidemia.  Lipitor 9.  Hypertension.  Monitor with increased mobility     Vitals:   09/13/20 1931 09/14/20 0421  BP: 107/66 109/69  Pulse: 89 76  Resp: 16 18  Temp: 98.9 F (37.2 C) 98.4 F (36.9 C)  SpO2:           Controlled, 09/14/2020 10.  History of tobacco abuse.  Counseling  11. Poor initiation/concentration-   7/25 increased ritalin to '15mg'$  12.  Constipation -Senokot 1 tab daily for daily regimen      LOS: 11 days A FACE TO FACE EVALUATION WAS PERFORMED  Betty Archer 09/14/2020, 9:28 AM

## 2020-09-14 NOTE — Patient Care Conference (Signed)
Inpatient RehabilitationTeam Conference and Plan of Care Update Date: 09/14/2020   Time: 10:03 AM    Patient Name: Betty Archer      Medical Record Number: [id number]  Date of Birth: 1960/12/25 Sex: Female         Room/Bed: 4W02C/4W02C-01 Payor Info: Payor: /    Admit Date/Time:  09/03/2020  2:06 PM  Primary Diagnosis:  Acute ischemic cerebrovascular accident (CVA) involving anterior cerebral artery territory [facility])  Hospital Problems: Principal Problem:   Acute ischemic cerebrovascular accident (CVA) involving anterior cerebral artery territory [facility])    Expected Discharge Date: Expected Discharge Date: 09/24/20  Team Members Present: Physician leading conference: Dr. Alger Simons Social Worker Present: Loralee Pacas, [place] Nurse Present: Dorthula Nettles, RN PT Present: Apolinar Junes, PT OT Present: Laverle Hobby, OT SLP Present: Weston Anna, SLP PPS Coordinator present : Gunnar Fusi, SLP     Current Status/Progress Goal Weekly Team Focus  Bowel/Bladder   Continent Of Bladder and Bowel  Maintain Bladder and Bowel control.      Swallow/Nutrition/ Hydration             ADL's   (S) UB ADLs, CGA-min A LB, flat affect, poor safety awareness  CGA- supervision  initiation/termination of tasks, ADLs, transfers, cognition   Mobility   Min A-CGA overall, gait >750 ft with RW, 16 steps B rails  CGA-supervision overall  Functional mobility, safety awareness, gait and stair training, activity tolerance, attention, balance, patient/caregiver education   Communication             Safety/Cognition/ Behavioral Observations  Min-Mod A  Supervision  complex problem solving, recall, attention and awareness   Pain   Denies Pain  Will remain pain free.      Skin               Discharge Planning:  Pt uninsured. Pt to d/c to home with support from sons/various family.   Team Discussion: Flat affect, doesn't engage with MD. Medically doing well. Continent B/B, with some  occasional incontinence. No reports of pain, sleep, or skin issues.  Patient on target to meet rehab goals: yes, supervision for upper body, contact guard/supervision for lower body. Subtle improvements. Needs frequent monitoring on safety. Tends to get up. Using RW. Impulsive, urinary urgency. Memory notebook is more like a personal diary.  *See Care Plan and progress notes for long and short-term goals.   Revisions to Treatment Plan:  Not at this time.  Teaching Needs: Family education, medication management, transfer training, gait training, stair training, balance training, endurance training, safety awareness.  Current Barriers to Discharge: Decreased caregiver support, Medical stability, Home enviroment access/layout, Incontinence, Lack of/limited family support, Weight, Medication compliance, and Behavior  Possible Resolutions to Barriers: Continue current medications, provide emotional support.     Medical Summary Current Status: improved arousal, still quite flat, distracted. slow to engage, bp controlled  Barriers to Discharge: Behavior;Medical stability   Possible Resolutions to [employer] Focus: daily assessment of labs/pt data, improving attention/safety awareness   Continued Need for Acute Rehabilitation Level of Care: The patient requires daily medical management by a physician with specialized training in physical medicine and rehabilitation for the following reasons: Direction of a multidisciplinary physical rehabilitation program to maximize functional independence : Yes Medical management of patient stability for increased activity during participation in an intensive rehabilitation regime.: Yes Analysis of laboratory values and/or radiology reports with any subsequent need for medication adjustment and/or medical intervention. : Yes   I attest that  I was present, lead the team conference, and concur with the assessment and plan of the team.   Cristi Loron 09/14/2020, 2:26 PM

## 2020-09-14 NOTE — Progress Notes (Signed)
Patient ID: Betty Archer, female   DOB: 1960-11-22, 60 y.o.   MRN: [id number]  SW met with pt and pt son Betty Archer in room to provide updates from team conference, preparing for pt to have a RW, and pt requiring 24/7 care at discharge. SW discussed with pt son if the family will; be able to provide 24/7 care. He stated between him and his brother Betty Archer they will work on providing care. SW discussed family education, and to follow-up when able to put in place.SW encouraged pt son to begin looking for RW.  Loralee Pacas, MSW, [place] Office: [phone] Cell: [phone] Fax: [phone]

## 2020-09-14 NOTE — Progress Notes (Signed)
Physical Therapy Session Note  Patient Details  Name: Betty Archer MRN: [id number] Date of Birth: 1960-12-25  Today's Date: 09/14/2020 PT Individual Time: [id number] PT Individual Time Calculation (min): 55 min   Short Term Goals: Week 2:  PT Short Term Goal 1 (Week 2): Patient will perform basic transfers with supervision >75% of the time. PT Short Term Goal 2 (Week 2): Patient will perform 16 steps using 1 rail with min A. PT Short Term Goal 3 (Week 2): Patient will improve Berg Balance scale by at least 5 point sto meet the MCID.  Skilled Therapeutic Interventions/Progress Updates:     Patient in recliner in the room upon PT arrival. Patient alert and agreeable to PT session. Patient denied pain during session.  Therapeutic Activity: Transfers/functional mobility: Patient performed sit to/from stand from the room recliner, standard arm chair x2, and mat table x1 with supervision with and without RW. Provided verbal cues for completing turns to sit and controlled descent x2. Patient ambulated in the room using RW to select clothing to wear with CGA and increased time to make selection due to decreased attention to task, able to redirect with mod cues. Patient donned pants in sitting with mod I for increased time and supervision for standing balance with RW to pull pants over her hips. She doffed a hospital gown with min A to untie the gown and donned a tank top and jacket with mod I for increased time.   Gait Training:  Patient ambulated >250 feet and >350 feet using a RW with CGA-close supervision and >200 feet without an AD performing forwards/backwards walking, side-stepping R/L, changes in speed, sudden stops, vertical and horizontal head turns, and tandem walking on the green line x2 with CGA-min A for balance (LOB R>L). Ambulated with decreased gait speed, decreased step length and height, mild forward trunk lean, narrow BOS, and increased external distraction (improved today with  increased gait speed.) Provided verbal cues for attention to task x2, increased step height and gait speed, increased BOS, and proximity to RW for safety.  Neuromuscular Re-ed: Patient performed the following activities: BITS memory task x2 min with up to 7 items, score 50% with patient closing her eyes during task and even falling asleep in standing briefly x1 Patient reported fatigue and returned to sitting where patient continued to doze off in sitting. Patient ambulated back to the room, as above, and PT attempted to review and fill out patient's memory notebook with her. Patient continued to fall asleep during this task. PT completed the notebook for patient to review at a later time.  Patient in recliner with her son in the room at end of session with breaks locked, seat belt alarm set, and all needs within reach.   Therapy Documentation Precautions:  Precautions Precautions: Fall Precaution Comments: L hemi with LLE>LUE, cognitive deficits, falls asleep easily Restrictions Weight Bearing Restrictions: No    Therapy/Group: Individual Therapy  Rosaland Shiffman L Samari Gorby PT, DPT  09/14/2020, 5:04 PM

## 2020-09-15 NOTE — Progress Notes (Addendum)
Physical Therapy Session Note  Patient Details  Name: Betty Archer MRN: [id number] Date of Birth: February 26, 1960  Today's Date: 09/15/2020 PT Individual Time: 0905-1000 PT Individual Time Calculation (min): 55 min   Short Term Goals: Week 2:  PT Short Term Goal 1 (Week 2): Patient will perform basic transfers with supervision >75% of the time. PT Short Term Goal 2 (Week 2): Patient will perform 16 steps using 1 rail with min A. PT Short Term Goal 3 (Week 2): Patient will improve Berg Balance scale by at least 5 point sto meet the MCID.  Skilled Therapeutic Interventions/Progress Updates:     Patient in recliner in the room upon PT arrival. Patient alert and agreeable to PT session. Patient denied pain during session. Patient very internally and externally distracted and perseverative on cleaning the sink throughout session. Set goal to go outside during session   Therapeutic Activity: Transfers/ADLs/IADLs: Patient performed sit to/from stand with supervision without AD throughout session. Provided verbal cues for impulsivity x1. Patient performed ambulatory toilet transfer x3 with supervision-CGA. Patient was incontinent of urine prior to session, patient did not make efforts to call to go to the bathroom or get cleaned up. Educated patient on how to call for assist and importance of maintaining continence, patient stated understanding, will need further reinforcement. Patient was continent of bladder then bowl during toileting and unsuccessful with BM on last transfer to the toilet. Required total A for doffing/donning incontinence brief and set-up assist for doffing scrub pants and gown and donning new scrub pants and shirt. Patient performed hand hygiene at the sink independently x3 and perseverated on wiping down the sink each time. PT attempted redirection from task and patient perseverated on making it "very clean" and added soap to her paper towel and preceded to wipe down soap dispenser. Cued  patient at 30 min and 10 min about time management to be able to go outside during session. Patient acknowledged cues, however, did not redirect from toileting or cleaning tasks. At 10 min remaining in session, patient reported she was ready to go outside. PT informed her that there was not time left in the session, patient agreeable to gait training.  Patient doffed non-skid socks and donned socks and tennis shoes with set-up assist, required min cues x1 to attend to task due to internal distraction.  Patient performed all tasks with close supervision for balance and in room ambulation demonstrating improved balance and postural control with functional activities.   Gait Training:  Patient ambulated ~300 feet without an AD with CGA and intermittent min A with distraction. Ambulated with improved, but still decreased, gait speed, decreased arm swing, narrow BOS, and improved postural control this session. Provided verbal cues for attention to task, increased arm swing for improved gait speed and balance without an AD, and increased BOS.  Patient in recliner in the room at end of session with breaks locked, seat belt alarm set, and all needs within reach. Reviewed tasked performed during session and had patient write them down in her memory notebook. Patient asked when she could go outside, instructed patient to ask Colletta [place], OT during upcoming OT session. Patient appropriately wrote this in her memory notebook.   Therapy Documentation Precautions:  Precautions Precautions: Fall Precaution Comments: L hemi with LLE>LUE, cognitive deficits, falls asleep easily Restrictions Weight Bearing Restrictions: No    Therapy/Group: Individual Therapy  Kahlee Metivier L Cinthia Rodden PT, DPT  09/15/2020, 12:47 PM

## 2020-09-15 NOTE — Progress Notes (Signed)
Speech Language Pathology Daily Session Note  Patient Details  Name: Betty Archer MRN: [id number] Date of Birth: [birth year]  Today's Date: 09/15/2020 SLP Individual Time: [id number] SLP Individual Time Calculation (min): 40 min  Short Term Goals: Week 2: SLP Short Term Goal 1 (Week 2): Patient will demonstrate sustained attention to functional tasks for 10 minutes with Min verbal cues for redirection. SLP Short Term Goal 2 (Week 2): Patient will complete basic problem solving tasks with min A verbal and visual cues. SLP Short Term Goal 3 (Week 2): Patient will self-monitor and correct errors during functional tasks with Min verbal and visual cues. SLP Short Term Goal 4 (Week 2): Patient will recall new and functional information with Mod A multimodal cues.  Skilled Therapeutic Interventions: Skilled treatment session focused on cognitive goals. SLP facilitated session by providing extra time for initiation of transfer to the recliner. Patient continues to utilize her memory notebook to record thoughts instead of therapy events. SLP provided ongoing education and reorganized the [employer] to maximize use and function. Patient was able to independently utilize her schedule today and was independently oriented X 4. Patient independently recalled the functions of 50% of her medications and organized a BID pill box with 100% accuracy and supervision level verbal cues. Patient left upright in recliner with alarm on and all needs within reach. Continue with current plan of care.      Pain No/Denies Pain   Therapy/Group: Individual Therapy  Tarry Blayney 09/15/2020, 12:30 PM

## 2020-09-15 NOTE — Progress Notes (Signed)
Occupational Therapy Session Note  Patient Details  Name: Betty Archer MRN: [id number] Date of Birth: [birth year]  Today's Date: 09/15/2020 OT Individual Time: 1030-1145 OT Individual Time Calculation (min): 75 min    Short Term Goals: Week 1:  OT Short Term Goal 1 (Week 1): Pt will complete STS in prep for standing ADL with consistent mod A in 2/3 trials. OT Short Term Goal 1 - Progress (Week 1): Met OT Short Term Goal 2 (Week 1): Pt will don pants with mod A + AE PRN. OT Short Term Goal 2 - Progress (Week 1): Met OT Short Term Goal 3 (Week 1): Pt will req no more than min VCs to terminate grooming task. OT Short Term Goal 3 - Progress (Week 1): Met OT Short Term Goal 4 (Week 1): Pt will maintain attention to task for at least 3 min with no more than min VCs. OT Short Term Goal 4 - Progress (Week 1): Progressing toward goal  Skilled Therapeutic Interventions/Progress Updates:     Pt received in recliner with no pain  ADL:  Pt completes toileting with CGA for standing balance during 2/3 components. Pt completes toileting transfer with RW to/from recliner/wc during session withCGA overall and VC for RW management  Therapeutic exercise Pt completes 2x10-15 dowel rod therex for BUE shoulder strengthening required for BADLs/functional transfers as follows with demo cuing and 3 # dowel rod  Shoulder flex/ext, shoulder press, horizonal ab/adduct Circles in B directions Elbow flex/ext, chest press Wrist flex/ext  Therapeutic activity Utilized memory notebook to assist with recalling what happened in other sessions. Originally despite notebook beoing out and open, pt reporting did not have tx yet today. Pt able to read and refer to request for this clinician to take pt outside.   Pt completes functional mobility with no AD and CGA around outside courtyard and over uneven surfaces with MIN A  Pt with poor awareness of deficits and cognitive impairments. Pt unable to recall listed 5  items to find in gift shop with music on working selective attention. When music off, pt able to recall items. Pt ambulates in gift shop and locates 2/5 items, but reports needing to have urgent BM. Located public restroom and pt completes with CGA as written above.  Pt left at end of session in recliner with exit alarm on, call light in reach and all needs met   Therapy Documentation Precautions:  Precautions Precautions: Fall Precaution Comments: L hemi with LLE>LUE, cognitive deficits, falls asleep easily Restrictions Weight Bearing Restrictions: No General:   Vital Signs: Therapy Vitals Temp: 98.2 F (36.8 C) Pulse Rate: 87 Resp: 18 BP: (!) 110/53 Patient Position (if appropriate): Lying Oxygen Therapy SpO2: 100 % O2 Device: Room Air Pain:   ADL: ADL Eating: Supervision/safety Where Assessed-Eating: Wheelchair Grooming: Supervision/safety Where Assessed-Grooming: Sitting at sink Upper Body Bathing: Supervision/safety Where Assessed-Upper Body Bathing: Wheelchair Lower Body Bathing: Minimal assistance Where Assessed-Lower Body Bathing: Bed level Upper Body Dressing: Moderate assistance Where Assessed-Upper Body Dressing: Edge of bed Lower Body Dressing: Dependent Where Assessed-Lower Body Dressing: Edge of bed Toileting: Dependent Where Assessed-Toileting: Bed level Toilet Transfer: Dependent Toilet Transfer Method: Other (comment) (stedy) Tub/Shower Transfer: Not assessed Social research officer, government: Not assessed Vision   Perception    Praxis   Exercises:   Other Treatments:     Therapy/Group: Individual Therapy  Tonny Branch 09/15/2020, 6:48 AM

## 2020-09-16 NOTE — Progress Notes (Signed)
PROGRESS NOTE   Subjective/Complaints:  Just returned from OT. Was out in the hall getting ice/pitcher. No complaints  ROS: Limited due to cognitive/behavioral     Objective:   No results found. No results for input(s): WBC, HGB, HCT, PLT in the last 72 hours.  No results for input(s): NA, K, CL, CO2, GLUCOSE, BUN, CREATININE, CALCIUM in the last 72 hours.   Intake/Output Summary (Last 24 hours) at 09/16/2020 1022 Last data filed at 09/16/2020 0900 Gross per 24 hour  Intake 716 ml  Output --  Net 716 ml        Physical Exam: Vital Signs Blood pressure 112/71, pulse 82, temperature 98.6 F (37 C), temperature source Oral, resp. rate 20, height '5\' 6"'$  (1.676 m), weight 83 kg, SpO2 99 %.  Constitutional: No distress . Vital signs reviewed. HEENT: EOMI, oral membranes moist Neck: supple Cardiovascular: RRR without murmur. No JVD    Respiratory/Chest: CTA Bilaterally without wheezes or rales. Normal effort    GI/Abdomen: BS +, non-tender, non-distended Ext: no clubbing, cyanosis, or edema Psych: flat but cooperative .   Skin: No evidence of breakdown, no evidence of rash Musculoskeletal:        General: No swelling or tenderness. Normal range of motion.    Cervical back: Neck supple. Neurological:    Comments: easily distracted. Can be redirected.     Tracks to left and right. Moves RUE and RLE 5/5. 4-/5 LUE and   4-/5LLE. Senses pain on all 4. No abnormal tone.        Assessment/Plan: 1. Functional deficits which require 3+ hours per day of interdisciplinary therapy in a comprehensive inpatient rehab setting. Physiatrist is providing close team supervision and 24 hour management of active medical problems listed below. Physiatrist and rehab team continue to assess barriers to discharge/monitor patient progress toward functional and medical goals  Care Tool:  Bathing    Body parts bathed by patient: Right  arm, Left arm, Chest, Abdomen, Face, Right upper leg, Left upper leg, Front perineal area, Buttocks, Right lower leg, Left lower leg     Body parts n/a: Front perineal area, Buttocks, Right lower leg, Left lower leg   Bathing assist Assist Level: Contact Guard/Touching assist     Upper Body Dressing/Undressing Upper body dressing   What is the patient wearing?: Button up shirt    Upper body assist Assist Level: Minimal Assistance - Patient > 75%    Lower Body Dressing/Undressing Lower body dressing      What is the patient wearing?: Pants, Incontinence brief     Lower body assist Assist for lower body dressing: Moderate Assistance - Patient 50 - 74% (tight leggings)     Toileting Toileting Toileting Activity did not occur (Clothing management and hygiene only): N/A (no void or bm)  Toileting assist Assist for toileting: Minimal Assistance - Patient > 75%     Transfers Chair/bed transfer  Transfers assist     Chair/bed transfer assist level: Minimal Assistance - Patient > 75%     Locomotion Ambulation   Ambulation assist      Assist level: Minimal Assistance - Patient > 75% Assistive device: Hand held assist Max  distance: >200 ft   Walk 10 feet activity   Assist  Walk 10 feet activity did not occur: Safety/medical concerns  Assist level: Minimal Assistance - Patient > 75% Assistive device: Hand held assist   Walk 50 feet activity   Assist Walk 50 feet with 2 turns activity did not occur: Safety/medical concerns  Assist level: Minimal Assistance - Patient > 75% Assistive device: Hand held assist    Walk 150 feet activity   Assist Walk 150 feet activity did not occur: Safety/medical concerns  Assist level: Minimal Assistance - Patient > 75% Assistive device: Hand held assist    Walk 10 feet on uneven surface  activity   Assist Walk 10 feet on uneven surfaces activity did not occur: Safety/medical concerns          Wheelchair     Assist Will patient use wheelchair at discharge?: Yes Type of Wheelchair: Manual Wheelchair activity did not occur: Safety/medical concerns         Wheelchair 50 feet with 2 turns activity    Assist    Wheelchair 50 feet with 2 turns activity did not occur: Safety/medical concerns       Wheelchair 150 feet activity     Assist  Wheelchair 150 feet activity did not occur: Safety/medical concerns       Blood pressure 112/71, pulse 82, temperature 98.6 F (37 C), temperature source Oral, resp. rate 20, height '5\' 6"'$  (1.676 m), weight 83 kg, SpO2 99 %.  Medical Problem List and Plan: 1.  Altered mental status with decreased functional mobility/left lower extremity weakness secondary to large right ACA and small left ACA infarct likely related to recent ACOM aneurysm post coiling and stenting             -patient may shower             -ELOS/Goals: 10-14 days, supervision goals with PT, OT, SLP  -Continue CIR therapies including PT, OT, and SLP  2.  Antithrombotics: -DVT/anticoagulation: SCDs             -antiplatelet therapy: Brilinta 90 mg twice daily, aspirin 81 mg daily 3. Pain Management: Lidoderm patch as directed, Tylenol as needed  7/28- denies pain-   4. Mood: Team to provide emotional support as necessary             -antipsychotic agents: N/A 5. Neuropsych: This patient is not capable of making decisions on her own behalf. 6. Skin/Wound Care: Routine skin checks 7. Fluids/Electrolytes/Nutrition: Routine in and outs               -still eating well 8.  Hyperlipidemia.  Lipitor 9.  Hypertension.  Monitor with increased mobility     Vitals:   09/15/20 2031 09/16/20 0545  BP: 113/69 112/71  Pulse: 87 82  Resp: 18 20  Temp: 98.6 F (37 C) 98.6 F (37 C)  SpO2: 100% 99%         Controlled, 09/14/2020 10.  History of tobacco abuse.  Counseling  11. Poor initiation/concentration-   7/25 increased ritalin to '15mg'$  bid---some  improvement 12. Constipation -Senokot 1 tab daily for daily regimen      LOS: 13 days A FACE TO FACE EVALUATION WAS PERFORMED  Betty Archer 09/16/2020, 10:22 AM

## 2020-09-16 NOTE — Progress Notes (Signed)
Occupational Therapy Weekly Progress Note  Patient Details  Name: Betty Archer MRN: [id number] Date of Birth: 04-06-1960  Beginning of progress report period: September 09, 2020 End of progress report period: September 16, 2020  Today's Date: 09/16/2020 OT Individual Time: 3299-2426 OT Individual Time Calculation (min): 57 min    Patient has met 3 of 4 short term goals.  Pt has made significant improvements in balance and ADL performance in quiet environments, however continues to be limited by cognition/awareness of deficits and incontinence. Pt is currently CGA for ADLs but demo posterior bias reliant on grab bars for balance. Pt demo poor selective attention and frequent impulsivity/motor perseverations. Pt would benefit from skilled OT to address to improve effiency safety, and independence wit ADLs and functional mobility  Patient continues to demonstrate the following deficits: muscle weakness, decreased cardiorespiratoy endurance, unbalanced muscle activation and decreased coordination, decreased attention, decreased awareness, decreased problem solving, decreased safety awareness, and decreased memory, and decreased standing balance, decreased postural control, hemiplegia, and decreased balance strategies and therefore will continue to benefit from skilled OT intervention to enhance overall performance with BADL and iADL.  Patient progressing toward long term goals..  Continue plan of care.  OT Short Term Goals Week 2:  OT Short Term Goal 1 (Week 2): Pt will maintian attention to task for 3 min or greater with no more than min VC OT Short Term Goal 1 - Progress (Week 2): Met OT Short Term Goal 2 (Week 2): Pt will complete grooming with set up in semi distracting environment to dmeo improved selective attention OT Short Term Goal 2 - Progress (Week 2): Progressing toward goal OT Short Term Goal 3 (Week 2): Pt will don pants wiht CGA OT Short Term Goal 3 - Progress (Week 2): Met OT Short Term  Goal 4 (Week 2): Pt will complete ambulatory bathroom transfers with CGA OT Short Term Goal 4 - Progress (Week 2): Met Week 3:  OT Short Term Goal 1 (Week 3): STG=LTG d/t ELOS  Skilled Therapeutic Interventions/Progress Updates:     Pt received in bathroom with pain in L groin- rest provided. Pt feels like legs are swollen.  ADL:  Pt completes bathing with CGA in shower for multiple small LOB posteriorly. Pt reliant on grab bars for balance recovery. VC required for not standing on 1 foot for washing feet Pt completes UB dressing with CGA for standing  Pt completes LB dressing with CGA at sit to stand level  Pt completes footwear with supervision seated Pt completes toileting with CGA 2x with no AD and use of grab bar for reaction to post LOB Pt completes bathroom transfer with CGA and no AD at amb level Pt completes oral care with set up at sink with no cuing to terminate activity but requires cuing to stop wiping the sink. Pt mildly agitated with OT when OT stated she had not used mouthwash with this clinician. Pt stating, "people in here are trying to tell me my memory is bad. I know who you are. Shit, even my doctor doesn't have a good memory." Explained this clinicians role for education on OT role/purpose.  Pt left at end of session in recliner with exit alarm on, call light in reach and all needs met   Therapy Documentation Precautions:  Precautions Precautions: Fall Precaution Comments: L hemi with LLE>LUE, cognitive deficits, falls asleep easily Restrictions Weight Bearing Restrictions: No General:   Vital Signs: Therapy Vitals Temp: 98.6 F (37 C) Temp Source: Oral  Pulse Rate: 82 Resp: 20 BP: 112/71 Patient Position (if appropriate): Lying Oxygen Therapy SpO2: 99 % O2 Device: Room Air Pain:   ADL: ADL Eating: Supervision/safety Where Assessed-Eating: Wheelchair Grooming: Supervision/safety Where Assessed-Grooming: Sitting at sink Upper Body Bathing:  Supervision/safety Where Assessed-Upper Body Bathing: Wheelchair Lower Body Bathing: Minimal assistance Where Assessed-Lower Body Bathing: Bed level Upper Body Dressing: Moderate assistance Where Assessed-Upper Body Dressing: Edge of bed Lower Body Dressing: Dependent Where Assessed-Lower Body Dressing: Edge of bed Toileting: Dependent Where Assessed-Toileting: Bed level Toilet Transfer: Dependent Toilet Transfer Method: Other (comment) (stedy) Tub/Shower Transfer: Not assessed Social research officer, government: Not assessed Vision   Perception    Praxis   Exercises:   Other Treatments:     Therapy/Group: Individual Therapy  Tonny Branch 09/16/2020, 6:49 AM

## 2020-09-16 NOTE — Progress Notes (Signed)
Speech Language Pathology Weekly Progress and Session Note  Patient Details  Name: JIMMI SIDENER MRN: [id number] Date of Birth: 1961-01-29  Beginning of progress report period: September 09, 2020 End of progress report period: September 16, 2020  Today's Date: 09/16/2020 SLP Individual Time: 9528-4132 SLP Individual Time Calculation (min): 40 min  Short Term Goals: Week 2: SLP Short Term Goal 1 (Week 2): Patient will demonstrate sustained attention to functional tasks for 10 minutes with Min verbal cues for redirection. SLP Short Term Goal 1 - Progress (Week 2): Met SLP Short Term Goal 2 (Week 2): Patient will complete basic problem solving tasks with min A verbal and visual cues. SLP Short Term Goal 2 - Progress (Week 2): Met SLP Short Term Goal 3 (Week 2): Patient will self-monitor and correct errors during functional tasks with Min verbal and visual cues. SLP Short Term Goal 3 - Progress (Week 2): Met SLP Short Term Goal 4 (Week 2): Patient will recall new and functional information with Mod A multimodal cues. SLP Short Term Goal 4 - Progress (Week 2): Met    New Short Term Goals: Week 3: SLP Short Term Goal 1 (Week 3): STGs=LTGs due to ELOS  Weekly Progress Updates: Patient continues to make slow, functional gains and has met 4 of 4 STGs this reporting period. Currently, patient requires overall Min-Mod A multimodal cues to complete basic and familiar tasks safely in regards to problem solving, attention, awareness and recall with use of strategies. Patient continues to demonstrate deficits in cessation of tasks and decreased attention in an unstructured environment. Patient also remains impulsive with decreased safety awareness. Patient and family education ongoing. Patient would benefit from continued skilled SLP intervention to maximize her cognitive functioning and overall functional independence prior to discharge.      Intensity: Minumum of 1-2 x/day, 30 to 90 minutes Frequency: 3 to 5  out of 7 days Duration/Length of Stay: 09/24/20 Treatment/Interventions: Cognitive remediation/compensation;Patient/family education;Therapeutic Activities;Internal/external aids;Cueing hierarchy;Environmental controls;Functional tasks   Daily Session  Skilled Therapeutic Interventions:  Skilled treatment session focused on cognitive goals. SLP facilitated session by providing extra time and overall Mod A verbal cues for problem solving during a complex organization task. Max A multimodal cues were needed for organization of information as patient contentiously read the same information multiple times event though it has been crossed off to signify that it had been completed. Patient demonstrated selective attention to task for 45 minutes with supervision verbal cues for redirection. Patient left upright in recliner with alarm on and all needs within reach. Continue with current plan of care.       Pain No/Denies Pain   Therapy/Group: Individual Therapy  Rodolfo Gaster 09/16/2020, 6:29 AM

## 2020-09-16 NOTE — Progress Notes (Signed)
Physical Therapy Session Note  Patient Details  Name: Betty Archer MRN: [id number] Date of Birth: March 30, 1960  Today's Date: 09/16/2020 PT Individual Time: [id number] PT Individual Time Calculation (min): 54 min   Short Term Goals: Week 2:  PT Short Term Goal 1 (Week 2): Patient will perform basic transfers with supervision >75% of the time. PT Short Term Goal 2 (Week 2): Patient will perform 16 steps using 1 rail with min A. PT Short Term Goal 3 (Week 2): Patient will improve Berg Balance scale by at least 5 point sto meet the MCID.  Skilled Therapeutic Interventions/Progress Updates:     Pt received sitting in recliner and agreeable to therapy. Gait belt placed prior to initiation of mobility.  Gait training in the hall from room <> 4w gym >259fx2 with CGA-close supervision. Verbal cuing for attention to task and increased gait speed. Pt demonstrates difficulty maintaining balance with dual tasking. Pt recalled 2/2 areas of focus during gait training (attention to task and gait speed). Color circle locating activity over span of ~422fCGA, requiring 2 passes to locate all 7 circles with min cuing. One instance of LOB towards the R due to distraction by circles, moderate manual assistance for recovery. Pt demonstrates ability to quickly and efficiently clean circles prior to returning then to drawer.   Stair training with 4 6" steps x4 CGA and UE on single HR (left going up) to initiate stimulation of STE apartment. Verbal and visual cuing for step to pattern, however, pt demonstrated ability to safely complete alternating pattern.   Transfers on/off floor activity with CGA for safety training and to simulate playing with grandkids.  Pt demonstrated ability to crawl forwards and backwards, sitting criss-cross, and long sitting with transitions in between. No instances of LOB. Verbal cuing with performing transitions and required increased time to complete task.   Pt education on safety  awareness with: attention to task during walking to prevent LOB, techniques to call for help if pt were to have a fall at home, and reconfirming prior education to call nursing staff when needing to use the restroom. Pt educated on signs and symptoms after a fall and requesting assist from a second person or call emergency services. Pt expressed understanding with appropriately answering questions regarding education.   Pt returned to room sitting in recliner with chair alarm on, call bell in reach, and no further needs expressed at this time.   Therapy Documentation Precautions:  Precautions Precautions: Fall Precaution Comments: L hemi with LLE>LUE, cognitive deficits, falls asleep easily Restrictions Weight Bearing Restrictions: No  Pain: Pain Assessment Pain Scale: 0-10 Pain Score: 0-No pain   Therapy/Group: Individual Therapy  Castulo Scarpelli, SPT 09/16/2020, 12:37 PM

## 2020-09-17 NOTE — Plan of Care (Signed)
  Problem: Consults Goal: RH STROKE PATIENT EDUCATION Description: See Patient Education module for education specifics  Outcome: Progressing   Problem: RH BOWEL ELIMINATION Goal: RH STG MANAGE BOWEL WITH ASSISTANCE Description: STG Manage Bowel with Supervision Assistance. Outcome: Progressing   Problem: RH SKIN INTEGRITY Goal: RH STG MAINTAIN SKIN INTEGRITY WITH ASSISTANCE Description: STG Maintain Skin Integrity With Supervision Assistance. Outcome: Progressing   Problem: RH SAFETY Goal: RH STG DECREASED RISK OF FALL WITH ASSISTANCE Description: STG Decreased Risk of Fall With supervision Assistance. Outcome: Progressing   Problem: RH PAIN MANAGEMENT Goal: RH STG PAIN MANAGED AT OR BELOW PT'S PAIN GOAL Description: < 3 on a 0-10 pain scale. Outcome: Progressing   Problem: RH KNOWLEDGE DEFICIT Goal: RH STG INCREASE KNOWLEDGE OF STROKE PROPHYLAXIS Description: Patient will be able to demonstrate knowledge of medications used to prevent future strokes with educational materials and handouts provided by staff independently at discharge. Outcome: Progressing   Problem: RH SAFETY Goal: RH STG ADHERE TO SAFETY PRECAUTIONS W/ASSISTANCE/DEVICE Description: STG Adhere to Safety Precautions With supervision Assistance/Device. Outcome: Not Progressing

## 2020-09-17 NOTE — Progress Notes (Signed)
PROGRESS NOTE   Subjective/Complaints: No problems overnight. Ate breakfast. Denies pain.   ROS: Limited due to cognitive/behavioral   Objective:   No results found. No results for input(s): WBC, HGB, HCT, PLT in the last 72 hours.  No results for input(s): NA, K, CL, CO2, GLUCOSE, BUN, CREATININE, CALCIUM in the last 72 hours.   Intake/Output Summary (Last 24 hours) at 09/17/2020 1045 Last data filed at 09/17/2020 0700 Gross per 24 hour  Intake 660 ml  Output --  Net 660 ml        Physical Exam: Vital Signs Blood pressure 107/70, pulse 88, temperature 98.8 F (37.1 C), resp. rate 18, height '5\' 6"'$  (1.676 m), weight 83 kg, SpO2 98 %.  Constitutional: No distress . Vital signs reviewed. HEENT: EOMI, oral membranes moist Neck: supple Cardiovascular: RRR without murmur. No JVD    Respiratory/Chest: CTA Bilaterally without wheezes or rales. Normal effort    GI/Abdomen: BS +, non-tender, non-distended Ext: no clubbing, cyanosis, or edema Psych: flat but cooperates when engaged Skin: No evidence of breakdown, no evidence of rash Musculoskeletal:        General: No swelling or tenderness. Normal range of motion.    Cervical back: Neck supple. Neurological:    Comments: easily distracted. Can be redirected physically/verbally.     Tracks to left and right. Moves RUE and RLE 5/5. 4/5 LUE and   4+/5LLE. Senses pain on all 4. No abnormal tone.        Assessment/Plan: 1. Functional deficits which require 3+ hours per day of interdisciplinary therapy in a comprehensive inpatient rehab setting. Physiatrist is providing close team supervision and 24 hour management of active medical problems listed below. Physiatrist and rehab team continue to assess barriers to discharge/monitor patient progress toward functional and medical goals  Care Tool:  Bathing    Body parts bathed by patient: Right arm, Left arm, Chest, Abdomen,  Face, Right upper leg, Left upper leg, Front perineal area, Buttocks, Right lower leg, Left lower leg     Body parts n/a: Front perineal area, Buttocks, Right lower leg, Left lower leg   Bathing assist Assist Level: Contact Guard/Touching assist     Upper Body Dressing/Undressing Upper body dressing   What is the patient wearing?: Button up shirt    Upper body assist Assist Level: Minimal Assistance - Patient > 75%    Lower Body Dressing/Undressing Lower body dressing      What is the patient wearing?: Pants, Incontinence brief     Lower body assist Assist for lower body dressing: Moderate Assistance - Patient 50 - 74% (tight leggings)     Toileting Toileting Toileting Activity did not occur (Clothing management and hygiene only): N/A (no void or bm)  Toileting assist Assist for toileting: Minimal Assistance - Patient > 75%     Transfers Chair/bed transfer  Transfers assist     Chair/bed transfer assist level: Minimal Assistance - Patient > 75%     Locomotion Ambulation   Ambulation assist      Assist level: Minimal Assistance - Patient > 75% Assistive device: Hand held assist Max distance: >200 ft   Walk 10 feet activity  Assist  Walk 10 feet activity did not occur: Safety/medical concerns  Assist level: Minimal Assistance - Patient > 75% Assistive device: Hand held assist   Walk 50 feet activity   Assist Walk 50 feet with 2 turns activity did not occur: Safety/medical concerns  Assist level: Minimal Assistance - Patient > 75% Assistive device: Hand held assist    Walk 150 feet activity   Assist Walk 150 feet activity did not occur: Safety/medical concerns  Assist level: Minimal Assistance - Patient > 75% Assistive device: Hand held assist    Walk 10 feet on uneven surface  activity   Assist Walk 10 feet on uneven surfaces activity did not occur: Safety/medical concerns         Wheelchair     Assist Will patient use  wheelchair at discharge?: Yes Type of Wheelchair: Manual Wheelchair activity did not occur: Safety/medical concerns         Wheelchair 50 feet with 2 turns activity    Assist    Wheelchair 50 feet with 2 turns activity did not occur: Safety/medical concerns       Wheelchair 150 feet activity     Assist  Wheelchair 150 feet activity did not occur: Safety/medical concerns       Blood pressure 107/70, pulse 88, temperature 98.8 F (37.1 C), resp. rate 18, height '5\' 6"'$  (1.676 m), weight 83 kg, SpO2 98 %.  Medical Problem List and Plan: 1.  Altered mental status with decreased functional mobility/left lower extremity weakness secondary to large right ACA and small left ACA infarct likely related to recent ACOM aneurysm post coiling and stenting             -patient may shower             -ELOS/Goals: 10-14 days, supervision goals with PT, OT, SLP  -Continue CIR therapies including PT, OT, and SLP  2.  Antithrombotics: -DVT/anticoagulation: SCDs             -antiplatelet therapy: Brilinta 90 mg twice daily, aspirin 81 mg daily 3. Pain Management: Lidoderm patch as directed, Tylenol as needed  7/28- denies pain-   4. Mood: Team to provide emotional support as necessary             -antipsychotic agents: N/A 5. Neuropsych: This patient is not capable of making decisions on her own behalf. 6. Skin/Wound Care: Routine skin checks 7. Fluids/Electrolytes/Nutrition: Routine in and outs               -still eating well 8.  Hyperlipidemia.  Lipitor 9.  Hypertension.  Monitor with increased mobility     Vitals:   09/16/20 2008 09/17/20 0407  BP: 104/74 107/70  Pulse: 98 88  Resp: 17 18  Temp: 99 F (37.2 C) 98.8 F (37.1 C)  SpO2: 100% 98%         controlled 7/29 10.  History of tobacco abuse.  Counseling  11. Poor initiation/concentration-   7/25 increased ritalin to '15mg'$  bid---with some improvement 12. Constipation -Senokot 1 tab daily for daily regimen       LOS: 14 days A FACE TO FACE EVALUATION WAS PERFORMED  [name] 09/17/2020, 10:45 AM

## 2020-09-17 NOTE — Progress Notes (Signed)
Speech Language Pathology Daily Session Note  Patient Details  Name: LASHANA CROWN MRN: [id number] Date of Birth: 1961/02/02  Today's Date: 09/17/2020 SLP Individual Time: [id number] SLP Individual Time Calculation (min): 45 min  Short Term Goals: Week 3: SLP Short Term Goal 1 (Week 3): STGs=LTGs due to ELOS  Skilled Therapeutic Interventions: Skilled treatment session focused on cognitive goals. Upon arrival, patient was awake and agreeable to transfer to recliner. Patient appeared to be doing something on her phone but was repeating the same motor pattern without reason or function. Despite contextual cue of SLP standing in front of patient with gait belt, patient required Max verbal cues to attend to task and initiate transfer. Patient participated in a basic problem solving task with Mod verbal and visual cues needed for attention and reasoning. During the mathematical section, patient attempted to locate the calculator on her phone but again became repetitive in punching letters onto the screen without reason or function despite Max A multimodal cues. Patient requested a drink and when SLP was returning, patient had gotten up without assistance to use the bathroom. SLP provided education regarding importance of calling nurse for assistance and not getting up without help. She verbalized understanding but will need reinforcement. Patient left on commode with NT present. Continue with current plan of care.      Pain Pain Assessment Pain Scale: 0-10 Pain Score: 0-No pain  Therapy/Group: Individual Therapy  Trygve Thal 09/17/2020, 10:22 AM

## 2020-09-17 NOTE — Progress Notes (Signed)
Physical Therapy Session Note  Patient Details  Name: Betty Archer MRN: [id number] Date of Birth: November 27, 1960  Today's Date: 09/17/2020 PT Individual Time: 1415-1515 PT Individual Time Calculation (min): 60 min   Short Term Goals: Week 1:  PT Short Term Goal 1 (Week 1): Pt will performbed mobility with Min A. PT Short Term Goal 1 - Progress (Week 1): Met PT Short Term Goal 2 (Week 1): Pt will perform STS transfers with overall Mod A. PT Short Term Goal 2 - Progress (Week 1): Met PT Short Term Goal 3 (Week 1): Pt will perform seat-to-seat transfers with overall Mod A. PT Short Term Goal 3 - Progress (Week 1): Met PT Short Term Goal 4 (Week 1): Pt will initiate gait training and complete 25 feet with Mod A using LRAD. PT Short Term Goal 4 - Progress (Week 1): Met PT Short Term Goal 5 (Week 1): Pt will initiate w/c mobility training. PT Short Term Goal 5 - Progress (Week 1): Discontinued (comment) (patient is a functional ambulator) Week 2:  PT Short Term Goal 1 (Week 2): Patient will perform basic transfers with supervision >75% of the time. PT Short Term Goal 2 (Week 2): Patient will perform 16 steps using 1 rail with min A. PT Short Term Goal 3 (Week 2): Patient will improve Berg Balance scale by at least 5 point sto meet the MCID. Week 3:     Skilled Therapeutic Interventions/Progress Updates:    Pt initially oob in recliner.  Nursing reporting family upset w/pt cleanliness/assist provided by staff.  Will discuss plans for pm OT session w/therapist. Pt Sit to stand w/cga.  Gait >2104f w/RW and cga, pt distracted w/busy environment during gait but no balance issues.  Standing PNF D1 PNF w/1.b weighted ball x 15 each direction, mild increased difficulty w/overhead to L.  Gait while passing small weighted ball between hands w/no AD and cga, mult mild balance losses w/self correction. Gait x 1563fcarrying basket full of 13 beanbags which therapist randomly distributed along path which  pt then collected while carrying basket x 15052fbending to floor and picking up w/cga only, occasionally uses rail on wall to assist but only 25% time.  No balance loss w/task.  Gait tossing/catching basketball in air x 200f15fcga, mild balance losses/sef recovery w/cga, balance loss primariy occurs w/head rotation while distracted by items or people in environment.  Gait attempting to name foods given first letter - results in significant slowing of gait and much difficulty w/naming task. Fatigues quickly w/mental challenge.  Alternating tapping 6 in step while attempting to calculate simple math problems - pt unable to dual task, pt stops each time to perform calculations despite mult cues to continue, 75% accuracy w/simple addition of single digits, distracted by gym environment.  Gait w/RW x 150ft52fW to dayroom and handed off to OT for scheduled group session.   Followed up w/family member in room per pt agreement to discuss concerns re: bathing/care.   I explained pt had shower yesterday, bath at sink today, and shower scheduled tomorrow w/OT but she that she was not able to recall these activities.  Explained that these were all documented in medical record by therapists.  Also discussed goals of ADLs in promoting pt independence w/these tasks and that she was not passively washed but sessions were goal driven.   She was also upset that NT took her to bathroom following incontinence/wet pants/wet chuck but didn't wipe her down before putting on clean pants  and also sat her back in chair w/wet chuck. I told her we (Nts) would be more careful about checking skin, clothing/seating surfaces. Sister asked if nursing could "get her up and walk her in the halls during the day".  Explained safety issues, staffing issues, extend of mobility being performed during sessions including today, and falls risk.   I also discussed her sister's cognitive issues and that she wasn't a reliable source right now for  recalling activities earlier in day.   Explained she did not remember am bath w/OT even though I confirmed w/OT.  Discussed difficulty w/dual task challenges presented in session today.  Sister voiced understanding and asked questions regarding cognition.   Followed up w/nursing following discussion to relay and confirm family concerns.    Therapy Documentation Precautions:  Precautions Precautions: Fall Precaution Comments: L hemi with LLE>LUE, cognitive deficits, falls asleep easily Restrictions Weight Bearing Restrictions: No    Therapy/Group: Individual Therapy Callie Fielding, [place] 09/17/2020, 3:36 PM

## 2020-09-17 NOTE — Progress Notes (Signed)
Patient ID: Betty Archer, female   DOB: 1960/03/07, 60 y.o.   MRN: [id number]  SW left message for [name] [phone]) to inform on DME: RW, and outpatient therapy recommendations. SW also requested follow-up about scheduling family edu. SW waiting on follow-up.  Loralee Pacas, MSW, [place] Office: [phone] Cell: [phone] Fax: [phone]

## 2020-09-17 NOTE — Progress Notes (Signed)
Occupational Therapy Session Note  Patient Details  Name: Betty Archer MRN: [id number] Date of Birth: 20-Nov-1960  Today's Date: 09/17/2020 OT Individual Time: 1115-1200 OT Individual Time Calculation (min): 45 min   Today's Date: 09/17/2020 OT Group Time: 1500-1600 OT Group Time Calculation (min): 60 min  Short Term Goals: Week 3:  OT Short Term Goal 1 (Week 3): STG=LTG d/t ELOS  Skilled Therapeutic Interventions/Progress Updates:     Pt received in recliner with no pain  ADL:  Pt completes toileting and peri bathing d/t urgent partially incontinent bladder void with VC for termination of plugging in phone (external distraction) while trying to ambualte to bathroom. Pt initially requriring MIN A for mobiltiy d/t increased lateral sway. Pt reporting dizziness intially, however after toileting feels better and requiring normal CGA level. After peri/buttock bathing with wash cloth and MOD A for clothing management d/t time  Therapeutic activity Attempted to assist pt with logging into google account as pt seen pushing the same four buttons repeatedly with no change/waiting for phone to react. Pt demo SIGNIFICANTLY impaired noticing/responding/termination skills. Pt unable to respond to errors when typing in passwords without step by step instruction. Ultimately pt requires MODA for typing in things correctly  Pt left at end of session in recliner with exit alarm on, call light in reach and all needs met  Session 2: Group tx Pt participated in rhythmic drumming group. Pain not present during session. Focus of group on BUE coordination, strengthening, endurance, timing/control, activity tolerance, and social participation and engagement. Pt performs session from seated position for energy conservation. Skilled interventions included increased trunk movement to engage/actvate core and larger ROM to improve shoulder strengthening. Warm up performed prior to exercises and UB stretching completed  at end of group with demo from OT. Pt able to select preferred song to share with group. Returned pt to room at end of session. Exited session with pt seated in recliner, exit alarm on and call light in reach   Therapy Documentation Precautions:  Precautions Precautions: Fall Precaution Comments: L hemi with LLE>LUE, cognitive deficits, falls asleep easily Restrictions Weight Bearing Restrictions: No General:   Vital Signs: Therapy Vitals Temp: 98.8 F (37.1 C) Pulse Rate: 88 Resp: 18 BP: 107/70 Patient Position (if appropriate): Lying Oxygen Therapy SpO2: 98 % O2 Device: Room Air Pain:   ADL: ADL Eating: Supervision/safety Where Assessed-Eating: Wheelchair Grooming: Supervision/safety Where Assessed-Grooming: Sitting at sink Upper Body Bathing: Supervision/safety Where Assessed-Upper Body Bathing: Wheelchair Lower Body Bathing: Minimal assistance Where Assessed-Lower Body Bathing: Bed level Upper Body Dressing: Moderate assistance Where Assessed-Upper Body Dressing: Edge of bed Lower Body Dressing: Dependent Where Assessed-Lower Body Dressing: Edge of bed Toileting: Dependent Where Assessed-Toileting: Bed level Toilet Transfer: Dependent Toilet Transfer Method: Other (comment) (stedy) Tub/Shower Transfer: Not assessed Walk-In Shower Transfer: Not assessed Vision   Perception    Praxis   Exercises:   Other Treatments:     Therapy/Group: Group Therapy and individual therapy  Tonny Branch 09/17/2020, 6:41 AM

## 2020-09-18 NOTE — Progress Notes (Signed)
PROGRESS NOTE   Subjective/Complaints: No complaints this morning   ROS: Denies pain  Objective:   No results found. No results for input(s): WBC, HGB, HCT, PLT in the last 72 hours.  No results for input(s): NA, K, CL, CO2, GLUCOSE, BUN, CREATININE, CALCIUM in the last 72 hours.   Intake/Output Summary (Last 24 hours) at 09/18/2020 1326 Last data filed at 09/18/2020 0821 Gross per 24 hour  Intake 780 ml  Output --  Net 780 ml        Physical Exam: Vital Signs Blood pressure 108/63, pulse 81, temperature 98.5 F (36.9 C), resp. rate 18, height '5\' 6"'$  (1.676 m), weight 83 kg, SpO2 100 %. Gen: no distress, normal appearing HEENT: oral mucosa pink and moist, NCAT Cardio: Reg rate Chest: normal effort, normal rate of breathing Abd: soft, non-distended Ext: no edema Psych: pleasant, normal affect Skin: No evidence of breakdown, no evidence of rash Musculoskeletal:        General: No swelling or tenderness. Normal range of motion.    Cervical back: Neck supple. Neurological:    Comments: easily distracted. Can be redirected physically/verbally.     Tracks to left and right. Moves RUE and RLE 5/5. 4/5 LUE and   4+/5LLE. Senses pain on all 4. No abnormal tone.        Assessment/Plan: 1. Functional deficits which require 3+ hours per day of interdisciplinary therapy in a comprehensive inpatient rehab setting. Physiatrist is providing close team supervision and 24 hour management of active medical problems listed below. Physiatrist and rehab team continue to assess barriers to discharge/monitor patient progress toward functional and medical goals  Care Tool:  Bathing    Body parts bathed by patient: Right arm, Left arm, Chest, Abdomen, Face, Right upper leg, Left upper leg, Front perineal area, Buttocks, Right lower leg, Left lower leg     Body parts n/a: Front perineal area, Buttocks, Right lower leg, Left lower  leg   Bathing assist Assist Level: Contact Guard/Touching assist     Upper Body Dressing/Undressing Upper body dressing   What is the patient wearing?: Button up shirt    Upper body assist Assist Level: Minimal Assistance - Patient > 75%    Lower Body Dressing/Undressing Lower body dressing      What is the patient wearing?: Pants, Incontinence brief     Lower body assist Assist for lower body dressing: Moderate Assistance - Patient 50 - 74% (tight leggings)     Toileting Toileting Toileting Activity did not occur (Clothing management and hygiene only): N/A (no void or bm)  Toileting assist Assist for toileting: Minimal Assistance - Patient > 75%     Transfers Chair/bed transfer  Transfers assist     Chair/bed transfer assist level: Minimal Assistance - Patient > 75%     Locomotion Ambulation   Ambulation assist      Assist level: Minimal Assistance - Patient > 75% Assistive device: Hand held assist Max distance: >200 ft   Walk 10 feet activity   Assist  Walk 10 feet activity did not occur: Safety/medical concerns  Assist level: Minimal Assistance - Patient > 75% Assistive device: Hand held assist  Walk 50 feet activity   Assist Walk 50 feet with 2 turns activity did not occur: Safety/medical concerns  Assist level: Minimal Assistance - Patient > 75% Assistive device: Hand held assist    Walk 150 feet activity   Assist Walk 150 feet activity did not occur: Safety/medical concerns  Assist level: Minimal Assistance - Patient > 75% Assistive device: Hand held assist    Walk 10 feet on uneven surface  activity   Assist Walk 10 feet on uneven surfaces activity did not occur: Safety/medical concerns         Wheelchair     Assist Will patient use wheelchair at discharge?: Yes Type of Wheelchair: Manual Wheelchair activity did not occur: Safety/medical concerns         Wheelchair 50 feet with 2 turns  activity    Assist    Wheelchair 50 feet with 2 turns activity did not occur: Safety/medical concerns       Wheelchair 150 feet activity     Assist  Wheelchair 150 feet activity did not occur: Safety/medical concerns       Blood pressure 108/63, pulse 81, temperature 98.5 F (36.9 C), resp. rate 18, height '5\' 6"'$  (1.676 m), weight 83 kg, SpO2 100 %.  Medical Problem List and Plan: 1.  Altered mental status with decreased functional mobility/left lower extremity weakness secondary to large right ACA and small left ACA infarct likely related to recent ACOM aneurysm post coiling and stenting             -patient may shower             -ELOS/Goals: 10-14 days, supervision goals with PT, OT, SLP  -Continue CIR therapies including PT, OT, and SLP  2.  Impaired mobility -DVT/anticoagulation: Continue SCDs             -antiplatelet therapy: Brilinta 90 mg twice daily, aspirin 81 mg daily 3. Pain Management: Lidoderm patch as directed, Tylenol as needed  7/30- denies pain-   4. Mood: Team to provide emotional support as necessary             -antipsychotic agents: N/A 5. Neuropsych: This patient is not capable of making decisions on her own behalf. 6. Skin/Wound Care: Routine skin checks 7. Fluids/Electrolytes/Nutrition: Routine in and outs               -still eating well 8.  Hyperlipidemia.  Continue Lipitor 9.  Hypertension.  Monitor with increased mobility     Vitals:   09/17/20 2013 09/18/20 0446  BP: 121/81 108/63  Pulse: 100 81  Resp: 18 18  Temp: 98.9 F (37.2 C) 98.5 F (36.9 C)  SpO2: 100% 100%         controlled 7/30 10.  History of tobacco abuse.  Counseling  11. Poor initiation/concentration-   7/25 increased ritalin to '15mg'$  bid---with some improvement 12. Constipation -Continue Senokot 1 tab daily for daily regimen      LOS: 15 days A FACE TO FACE EVALUATION WAS PERFORMED  Lyndol Vanderheiden P Lamontae Ricardo 09/18/2020, 1:26 PM

## 2020-09-18 NOTE — Progress Notes (Signed)
Occupational Therapy Session Note  Patient Details  Name: Betty Archer MRN: [id number] Date of Birth: 01/11/1961  Today's Date: 09/18/2020 OT Individual Time: 1000-1114 OT Individual Time Calculation (min): 74 min    Short Term Goals: Week 1:  OT Short Term Goal 1 (Week 1): Pt will complete STS in prep for standing ADL with consistent mod A in 2/3 trials. OT Short Term Goal 1 - Progress (Week 1): Met OT Short Term Goal 2 (Week 1): Pt will don pants with mod A + AE PRN. OT Short Term Goal 2 - Progress (Week 1): Met OT Short Term Goal 3 (Week 1): Pt will req no more than min VCs to terminate grooming task. OT Short Term Goal 3 - Progress (Week 1): Met OT Short Term Goal 4 (Week 1): Pt will maintain attention to task for at least 3 min with no more than min VCs. OT Short Term Goal 4 - Progress (Week 1): Progressing toward goal  Skilled Therapeutic Interventions/Progress Updates:     Pt received in bed with unraed out of 10 pain in groin. Heat packs (disposable) provided for pain relief  ADL:  Pt completes bathing with supervision with heavy reliance on grab bars to correct posterior LOB and VC for seated washing/rinsing of feet to improve safety Pt completes UB dressing with supervision seated on EOB Pt completes LB dressing with seated on TTB and EOB. Pt requires MOD A for posterior LOB donning brief d/t poor weight shift forward and righting reactions attempting to use grab bar but too far away  Pt completes footwear with set up seatedon EOB Pt completes toileting with supervision with grab bar for balance after incontinent BM in toilet Pt completes toileting transfer with CGA for functional ambulation with no AD into and out of bathroom Pt completes shower/Tub transfer with CGA at ambualtoru level   Therapeutic activity Visual scanning activity numbers 1-10 locating odd and even numbers in ascending order. Pt unable to keep track of odd v eeven numbers without MAX cuing. CGA for  functional mobility in hallway. Naming/walking for dual task animals in order with VC for recalling where in the alphabet 30% of time and cuing to name an animal 25% of time.  Pt left at end of session in recliner with exit alarm on, call light in reach and all needs met   Therapy Documentation Precautions:  Precautions Precautions: Fall Precaution Comments: L hemi with LLE>LUE, cognitive deficits, falls asleep easily Restrictions Weight Bearing Restrictions: No General:   Vital Signs: Therapy Vitals Temp: 98.5 F (36.9 C) Pulse Rate: 81 Resp: 18 BP: 108/63 Patient Position (if appropriate): Lying Oxygen Therapy SpO2: 100 % Pain:   ADL: ADL Eating: Supervision/safety Where Assessed-Eating: Wheelchair Grooming: Supervision/safety Where Assessed-Grooming: Sitting at sink Upper Body Bathing: Supervision/safety Where Assessed-Upper Body Bathing: Wheelchair Lower Body Bathing: Minimal assistance Where Assessed-Lower Body Bathing: Bed level Upper Body Dressing: Moderate assistance Where Assessed-Upper Body Dressing: Edge of bed Lower Body Dressing: Dependent Where Assessed-Lower Body Dressing: Edge of bed Toileting: Dependent Where Assessed-Toileting: Bed level Toilet Transfer: Dependent Toilet Transfer Method: Other (comment) (stedy) Tub/Shower Transfer: Not assessed Social research officer, government: Not assessed Vision   Perception    Praxis   Exercises:   Other Treatments:     Therapy/Group: Individual Therapy  Tonny Branch 09/18/2020, 6:44 AM

## 2020-09-18 NOTE — Plan of Care (Signed)
  Problem: Consults Goal: RH STROKE PATIENT EDUCATION Description: See Patient Education module for education specifics  Outcome: Progressing   Problem: RH BOWEL ELIMINATION Goal: RH STG MANAGE BOWEL WITH ASSISTANCE Description: STG Manage Bowel with Supervision Assistance. Outcome: Progressing   Problem: RH SKIN INTEGRITY Goal: RH STG MAINTAIN SKIN INTEGRITY WITH ASSISTANCE Description: STG Maintain Skin Integrity With Supervision Assistance. Outcome: Progressing   Problem: RH SAFETY Goal: RH STG ADHERE TO SAFETY PRECAUTIONS W/ASSISTANCE/DEVICE Description: STG Adhere to Safety Precautions With supervision Assistance/Device. Outcome: Progressing   Problem: RH PAIN MANAGEMENT Goal: RH STG PAIN MANAGED AT OR BELOW PT'S PAIN GOAL Description: < 3 on a 0-10 pain scale. Outcome: Progressing   Problem: RH KNOWLEDGE DEFICIT Goal: RH STG INCREASE KNOWLEDGE OF STROKE PROPHYLAXIS Description: Patient will be able to demonstrate knowledge of medications used to prevent future strokes with educational materials and handouts provided by staff independently at discharge. Outcome: Progressing   Problem: RH SAFETY Goal: RH STG DECREASED RISK OF FALL WITH ASSISTANCE Description: STG Decreased Risk of Fall With supervision Assistance. Outcome: Not Progressing

## 2020-09-19 NOTE — Plan of Care (Signed)
  Problem: Consults Goal: RH STROKE PATIENT EDUCATION Description: See Patient Education module for education specifics  Outcome: Progressing   Problem: RH BOWEL ELIMINATION Goal: RH STG MANAGE BOWEL WITH ASSISTANCE Description: STG Manage Bowel with Supervision Assistance. Outcome: Progressing   Problem: RH SKIN INTEGRITY Goal: RH STG MAINTAIN SKIN INTEGRITY WITH ASSISTANCE Description: STG Maintain Skin Integrity With Supervision Assistance. Outcome: Progressing   Problem: RH PAIN MANAGEMENT Goal: RH STG PAIN MANAGED AT OR BELOW PT'S PAIN GOAL Description: < 3 on a 0-10 pain scale. Outcome: Progressing   Problem: RH KNOWLEDGE DEFICIT Goal: RH STG INCREASE KNOWLEDGE OF STROKE PROPHYLAXIS Description: Patient will be able to demonstrate knowledge of medications used to prevent future strokes with educational materials and handouts provided by staff independently at discharge. Outcome: Progressing

## 2020-09-19 NOTE — Progress Notes (Signed)
Patient refused tolieting x 3 with staff. Patient educated to timed tolieting. Patient does not want staff checking to make sure that she did not have a incontinent episode. Patient speaking to unknown person on  cellphone and stating that she is not going to bed. Patient in recliner with lap belt alarm and tele-sitter active and on. Sanda Linger, LPN

## 2020-09-19 NOTE — Progress Notes (Signed)
Patient continues with every two hour toilet. Upon offering toilet patient refusing then request accompanying family member to toilet her. Patient and family have been educated to call for toilet due to family members not being checked of to ambulate patient. Family member also educated to not turn off bed alarm. Telesitter in place. Sanda Linger, LPN

## 2020-09-19 NOTE — Progress Notes (Signed)
Speech Language Pathology Daily Session Note  Patient Details  Name: Betty Archer MRN: [id number] Date of Birth: [birth year]  Today's Date: 09/19/2020 SLP Individual Time: 0935-1030 SLP Individual Time Calculation (min): 55 min  Short Term Goals: Week 3: SLP Short Term Goal 1 (Week 3): STGs=LTGs due to ELOS  Skilled Therapeutic Interventions:  Pt was seen for skilled ST targeting cognitive goals.  Upon arrival pt was in bed awake, alert, and agreeable to participating in treatment.  Pt initially denied that she had to go to the bathroom; however, upon standing to transfer to recliner from bed SLP noticed that pt had soiled all of her bed linens through her incontinence brief.  Pt was agreeable to hygiene and changing into a clean brief and robe but appeared to have no awareness of her incontinence. While changing, pt expressed an urgent need to void and began walking to the bathroom while only holding on to one handle of her rolling walker.  Max multimodal cues were needed to correct hand placement for safe ambulation to toilet.  Pt was incontinent on the floor while walking to the bathroom and voided again x2 while seated on the commode.  Min verbal cues needed for cessation of task when pt was washing her hands at the sink.  Once pt was seated in the recliner, SLP facilitated the session with a novel card game targeting goals for problem solving.  Pt was able to plan and execute a problem solving strategy within task with mod faded to supervision verbal cues.  Pt's twin sister was present for the second half of today's therapy session and was updated on pt's current goals and progress in therapy.  Pt was left in recliner with sister at bedside, chair alarm set, and call bell within reach.  Continue per current plan of care.    Pain Pain Assessment Pain Scale: 0-10 Pain Score: 0-No pain  Therapy/Group: Individual Therapy  Rondy Krupinski, Selinda Orion 09/19/2020, 12:53 PM

## 2020-09-19 NOTE — Progress Notes (Signed)
Patient  has broken Financial planner on table. RN placed it on clear cup container with lid and placed back on table. Instructed patient to send home with family tomorrow. Pt agreed. Charge RN informed.

## 2020-09-19 NOTE — Progress Notes (Signed)
PROGRESS NOTE   Subjective/Complaints: No complaints this morning She is writing a letter and friend next to her is coloring. Appreciate LPN Tomeka's education regarding toileting. Telesitter in place  ROS: Denies pain  Objective:   No results found. No results for input(s): WBC, HGB, HCT, PLT in the last 72 hours.  No results for input(s): NA, K, CL, CO2, GLUCOSE, BUN, CREATININE, CALCIUM in the last 72 hours.   Intake/Output Summary (Last 24 hours) at 09/19/2020 1642 Last data filed at 09/19/2020 1255 Gross per 24 hour  Intake 840 ml  Output --  Net 840 ml        Physical Exam: Vital Signs Blood pressure 104/67, pulse 90, temperature (!) 97.5 F (36.4 C), temperature source Oral, resp. rate 18, height '5\' 6"'$  (1.676 m), weight 83 kg, SpO2 100 %. Gen: no distress, normal appearing HEENT: oral mucosa pink and moist, NCAT Cardio: Reg rate Chest: normal effort, normal rate of breathing Abd: soft, non-distended Ext: no edema Psych: pleasant, normal affect Skin: No evidence of breakdown, no evidence of rash Musculoskeletal:        General: No swelling or tenderness. Normal range of motion.    Cervical back: Neck supple. Neurological:    Comments: easily distracted. Can be redirected physically/verbally.     Tracks to left and right. Moves RUE and RLE 5/5. 4/5 LUE and   4+/5LLE. Senses pain on all 4. No abnormal tone.        Assessment/Plan: 1. Functional deficits which require 3+ hours per day of interdisciplinary therapy in a comprehensive inpatient rehab setting. Physiatrist is providing close team supervision and 24 hour management of active medical problems listed below. Physiatrist and rehab team continue to assess barriers to discharge/monitor patient progress toward functional and medical goals  Care Tool:  Bathing    Body parts bathed by patient: Right arm, Left arm, Chest, Abdomen, Face, Right upper  leg, Left upper leg, Front perineal area, Buttocks, Right lower leg, Left lower leg     Body parts n/a: Front perineal area, Buttocks, Right lower leg, Left lower leg   Bathing assist Assist Level: Contact Guard/Touching assist     Upper Body Dressing/Undressing Upper body dressing   What is the patient wearing?: Button up shirt    Upper body assist Assist Level: Minimal Assistance - Patient > 75%    Lower Body Dressing/Undressing Lower body dressing      What is the patient wearing?: Pants, Incontinence brief     Lower body assist Assist for lower body dressing: Moderate Assistance - Patient 50 - 74% (tight leggings)     Toileting Toileting Toileting Activity did not occur (Clothing management and hygiene only): N/A (no void or bm)  Toileting assist Assist for toileting: Minimal Assistance - Patient > 75%     Transfers Chair/bed transfer  Transfers assist     Chair/bed transfer assist level: Minimal Assistance - Patient > 75%     Locomotion Ambulation   Ambulation assist      Assist level: Minimal Assistance - Patient > 75% Assistive device: Hand held assist Max distance: >200 ft   Walk 10 feet activity   Assist  Walk  10 feet activity did not occur: Safety/medical concerns  Assist level: Minimal Assistance - Patient > 75% Assistive device: Hand held assist   Walk 50 feet activity   Assist Walk 50 feet with 2 turns activity did not occur: Safety/medical concerns  Assist level: Minimal Assistance - Patient > 75% Assistive device: Hand held assist    Walk 150 feet activity   Assist Walk 150 feet activity did not occur: Safety/medical concerns  Assist level: Minimal Assistance - Patient > 75% Assistive device: Hand held assist    Walk 10 feet on uneven surface  activity   Assist Walk 10 feet on uneven surfaces activity did not occur: Safety/medical concerns         Wheelchair     Assist Will patient use wheelchair at  discharge?: Yes Type of Wheelchair: Manual Wheelchair activity did not occur: Safety/medical concerns         Wheelchair 50 feet with 2 turns activity    Assist    Wheelchair 50 feet with 2 turns activity did not occur: Safety/medical concerns       Wheelchair 150 feet activity     Assist  Wheelchair 150 feet activity did not occur: Safety/medical concerns       Blood pressure 104/67, pulse 90, temperature (!) 97.5 F (36.4 C), temperature source Oral, resp. rate 18, height '5\' 6"'$  (1.676 m), weight 83 kg, SpO2 100 %.  Medical Problem List and Plan: 1.  Altered mental status with decreased functional mobility/left lower extremity weakness secondary to large right ACA and small left ACA infarct likely related to recent ACOM aneurysm post coiling and stenting             -patient may shower             -ELOS/Goals: 10-14 days, supervision goals with PT, OT, SLP  -Continue CIR therapies including PT, OT, and SLP  2.  Impaired mobility -DVT/anticoagulation: Continue SCDs             -antiplatelet therapy: Brilinta 90 mg twice daily, aspirin 81 mg daily 3. Pain Management: Lidoderm patch as directed, Tylenol as needed  7/30- denies pain-   4. Mood: Team to provide emotional support as necessary             -antipsychotic agents: N/A 5. Neuropsych: This patient is not capable of making decisions on her own behalf. 6. Skin/Wound Care: Routine skin checks 7. Fluids/Electrolytes/Nutrition: Routine in and outs               -still eating well 8.  Hyperlipidemia.  Continue Lipitor 9.  Hypertension.  Monitor with increased mobility     Vitals:   09/19/20 0444 09/19/20 1316  BP: 108/65 104/67  Pulse: 74 90  Resp: 17 18  Temp: 98.5 F (36.9 C) (!) 97.5 F (36.4 C)  SpO2: 100% 100%         controlled 7/31 10.  History of tobacco abuse.  Continue counseling  11. Poor initiation/concentration-   7/25 increased ritalin to '15mg'$  bid---with some improvement 12.  Constipation -Continue Senokot 1 tab daily for daily regimen      LOS: 16 days A FACE TO FACE EVALUATION WAS PERFORMED  Clide Deutscher Hughey Rittenberry 09/19/2020, 4:42 PM

## 2020-09-20 MED ORDER — SORBITOL 70 % SOLN
60.0000 mL | Status: AC
  Administered 2020-09-20: 60 mL via ORAL
  Filled 2020-09-20: qty 60

## 2020-09-20 MED ORDER — SENNOSIDES-DOCUSATE SODIUM 8.6-50 MG PO TABS
2.0000 | ORAL_TABLET | Freq: Every day | ORAL | Status: DC
Start: 2020-09-20 — End: 2020-09-24
  Administered 2020-09-21 – 2020-09-23 (×3): 2 via ORAL
  Filled 2020-09-20 (×4): qty 2

## 2020-09-20 NOTE — Progress Notes (Signed)
Occupational Therapy Session Note  Patient Details  Name: Betty Archer MRN: [id number] Date of Birth: 06-Mar-1960  Today's Date: 09/20/2020 OT Individual Time: 1135-1200 OT Individual Time Calculation (min): 25 min   Session 2: OT Individual Time: 9892-1194 OT Individual Time Calculation (min): 43 min    Short Term Goals: Week 3:  OT Short Term Goal 1 (Week 3): STG=LTG d/t ELOS  Skilled Therapeutic Interventions/Progress Updates:    Pt sitting in the recliner with no c/o pain. 200 ft of functional mobility to the therapy gym with supervision using the RW. Session focused on dual tasking with cognitive demands including attention, sequencing, and working memory, combined with general functional mobility, as well as BUE strengthening task with 5 lb dumbbell. Pt returned to her room and with encouragement completed toileting tasks. She voided urine and had clean incontinence brief. She returned to the recliner and was left sitting up with the chair alarm set.   Session 2: Pt sitting in the recliner with no c/o pain and agreeable to shower. Pt perseveratively tapping on her phone, typing random letters into a google recovery email website. Pt was unable to terminate this task on her own and required OT physically taking phone from her. Pt singing the same 4 lines of a song throughout session and in good spirits. Ambulatory transfer into bathroom with supervision using RW. Bathing completed standing with min posterior support from TTB. 2 posterior LOB with self recovery using grab bar. Pt applied lotion while seated, requiring cueing to complete this task seated and she required extended time as she often became distracted from task and did not require any cueing to continue, just extra time. She completed dressing with supervision for UB and CGA LB. Oral care in standing with supervision. Pt left sitting in recliner with all needs met, chair alarm set.   Therapy Documentation Precautions:   Precautions Precautions: Fall Precaution Comments: L hemi with LLE>LUE, cognitive deficits, falls asleep easily Restrictions Weight Bearing Restrictions: No  Therapy/Group: Individual Therapy  Curtis Sites 09/20/2020, 11:56 AM

## 2020-09-20 NOTE — Progress Notes (Addendum)
Speech Language Pathology Daily Session Note  Patient Details  Name: DAYANE LACKS MRN: [id number] Date of Birth: 10/21/1960  Today's Date: 09/20/2020 SLP Individual Time: [id number] SLP Individual Time Calculation (min): 40 min  Short Term Goals: Week 3: SLP Short Term Goal 1 (Week 3): STGs=LTGs due to ELOS  Skilled Therapeutic Interventions: Skilled treatment session focused on cognitive goals. Upon arrival, patient was awake in the recliner and agreeable to treatment session. SLP facilitated session by re-administering the [facility] Mental Status Examination (SLUMS). Patient scored 19/30 points with a score of 27 or above considered normal. Patient's score improved by 3 points but patient continues to demonstrate moderate-severe cognitive impairments. Patient began to push the table out of the way and when asked, reported she needed to use the bathroom. Patient was incontinent of urine but with extra time, was continent of bowel. While on the commode, patient was singing songs which appeared to distract her from the task. Patient washed her hands within a timely manner but then wiped down the sink requiring Mod verbal cues for redirection. Patient left upright in recliner with alarm on and all needs within reach. Continue with current plan of care.       Pain No/Denies Pain   Therapy/Group: Individual Therapy  Marjo Grosvenor 09/20/2020, 3:24 PM

## 2020-09-20 NOTE — Progress Notes (Signed)
Physical Therapy Session Note  Patient Details  Name: Betty Archer MRN: [id number] Date of Birth: 1961-01-13  Today's Date: 09/20/2020 PT Individual Time: 0921-1000 PT Individual Time Calculation (min): 39 min   Short Term Goals: Week 1:  PT Short Term Goal 1 (Week 1): Pt will performbed mobility with Min A. PT Short Term Goal 1 - Progress (Week 1): Met PT Short Term Goal 2 (Week 1): Pt will perform STS transfers with overall Mod A. PT Short Term Goal 2 - Progress (Week 1): Met PT Short Term Goal 3 (Week 1): Pt will perform seat-to-seat transfers with overall Mod A. PT Short Term Goal 3 - Progress (Week 1): Met PT Short Term Goal 4 (Week 1): Pt will initiate gait training and complete 25 feet with Mod A using LRAD. PT Short Term Goal 4 - Progress (Week 1): Met PT Short Term Goal 5 (Week 1): Pt will initiate w/c mobility training. PT Short Term Goal 5 - Progress (Week 1): Discontinued (comment) (patient is a functional ambulator) Week 2:  PT Short Term Goal 1 (Week 2): Patient will perform basic transfers with supervision >75% of the time. PT Short Term Goal 2 (Week 2): Patient will perform 16 steps using 1 rail with min A. PT Short Term Goal 3 (Week 2): Patient will improve Berg Balance scale by at least 5 point sto meet the MCID.  Skilled Therapeutic Interventions/Progress Updates:  Pt received sitting in recliner in room w/Telesitter. Reported pain in stomach as 8/10, had received pain meds prior to session. Emphasis of session on safety awareness and gait training. Donned pt's shoes w/total A and added extra hospital gown to drape posterior side of pt. Pt stood impulsively from recliner to get to RW and reported she needed to use bathroom. Ambulated 15' to bathroom w/RW and CGA, stand <>sit w/supervision. Removed soiled brief and pt urinated in floor. Educated pt on toileting schedule and incontinence, pt did not remember ever having toileting schedule. Pt performed peri care  independently and stood impulsively to bend over and wash feet. Instructed pt to sit down to wash feet and after a few tries, she complied. Donned clean brief w/mod A and pt able to pull brief up w/min A in standing w/UE support on RW. Pt ambulated to sink and washed hands in standing w/CGA. Pt ambulated from room to [facility] hallway and back w/min A and verbal cues for improved step clearance, use of RW and sustaining attention to task. Noted pt was scuffing L foot on ground, which did not improve with cues. Pt demonstrated R path deviations and occasional LOB to R side which she was able to self-correct. Pt's gait speed decreased significantly when engaging in conversation and pt unable to answer several of therapist's questions regarding her family while she was walking. Upon returning to room, pt reported pain in medial L big toe that she described as "hangnail pain" and was unable to provide number for pain. Removed pt's shoe and assessed toe but did not note any injury. Suspect toe box of shoe is too narrow. Pt was left sitting in recliner with all needs in reach, reported no pain and was filling out her memory notebook.   Therapy Documentation Precautions:  Precautions Precautions: Fall Precaution Comments: L hemi with LLE>LUE, cognitive deficits, falls asleep easily Restrictions Weight Bearing Restrictions: No   Therapy/Group: Individual Therapy Cruzita Lederer Braydin Aloi, PT, DPT  09/20/2020, 7:45 AM

## 2020-09-20 NOTE — Progress Notes (Signed)
PROGRESS NOTE   Subjective/Complaints: No new issues today. Up in chair at window. Just returned from therapy  ROS: Limited due to cognitive/behavioral   Objective:   No results found. No results for input(s): WBC, HGB, HCT, PLT in the last 72 hours.  No results for input(s): NA, K, CL, CO2, GLUCOSE, BUN, CREATININE, CALCIUM in the last 72 hours.   Intake/Output Summary (Last 24 hours) at 09/20/2020 0956 Last data filed at 09/20/2020 0718 Gross per 24 hour  Intake 960 ml  Output --  Net 960 ml        Physical Exam: Vital Signs Blood pressure 113/66, pulse 85, temperature 98.3 F (36.8 C), resp. rate 16, height '5\' 6"'$  (1.676 m), weight 83 kg, SpO2 100 %. Constitutional: No distress . Vital signs reviewed. HEENT: EOMI, oral membranes moist Neck: supple Cardiovascular: RRR without murmur. No JVD    Respiratory/Chest: CTA Bilaterally without wheezes or rales. Normal effort    GI/Abdomen: BS +, non-tender, non-distended Ext: no clubbing, cyanosis, or edema Psych: pleasant and cooperative  Skin: No evidence of breakdown, no evidence of rash Musc:       General: No swelling or tenderness. Normal range of motion.    Cervical back: Neck supple. Neurological:    Comments: remains distracted. Follows commands.     Tracks to left and right. Moves RUE and RLE 5/5. 4/5 LUE and   4+/5LLE. Senses pain on all 4. Normal tone.        Assessment/Plan: 1. Functional deficits which require 3+ hours per day of interdisciplinary therapy in a comprehensive inpatient rehab setting. Physiatrist is providing close team supervision and 24 hour management of active medical problems listed below. Physiatrist and rehab team continue to assess barriers to discharge/monitor patient progress toward functional and medical goals  Care Tool:  Bathing    Body parts bathed by patient: Right arm, Left arm, Chest, Abdomen, Face, Right upper leg, Left  upper leg, Front perineal area, Buttocks, Right lower leg, Left lower leg     Body parts n/a: Front perineal area, Buttocks, Right lower leg, Left lower leg   Bathing assist Assist Level: Contact Guard/Touching assist     Upper Body Dressing/Undressing Upper body dressing   What is the patient wearing?: Button up shirt    Upper body assist Assist Level: Minimal Assistance - Patient > 75%    Lower Body Dressing/Undressing Lower body dressing      What is the patient wearing?: Pants, Incontinence brief     Lower body assist Assist for lower body dressing: Moderate Assistance - Patient 50 - 74% (tight leggings)     Toileting Toileting Toileting Activity did not occur (Clothing management and hygiene only): N/A (no void or bm)  Toileting assist Assist for toileting: Minimal Assistance - Patient > 75%     Transfers Chair/bed transfer  Transfers assist     Chair/bed transfer assist level: Minimal Assistance - Patient > 75%     Locomotion Ambulation   Ambulation assist      Assist level: Minimal Assistance - Patient > 75% Assistive device: Hand held assist Max distance: >200 ft   Walk 10 feet activity   Assist  Walk 10 feet activity did not occur: Safety/medical concerns  Assist level: Minimal Assistance - Patient > 75% Assistive device: Hand held assist   Walk 50 feet activity   Assist Walk 50 feet with 2 turns activity did not occur: Safety/medical concerns  Assist level: Minimal Assistance - Patient > 75% Assistive device: Hand held assist    Walk 150 feet activity   Assist Walk 150 feet activity did not occur: Safety/medical concerns  Assist level: Minimal Assistance - Patient > 75% Assistive device: Hand held assist    Walk 10 feet on uneven surface  activity   Assist Walk 10 feet on uneven surfaces activity did not occur: Safety/medical concerns         Wheelchair     Assist Will patient use wheelchair at discharge?:  Yes Type of Wheelchair: Manual Wheelchair activity did not occur: Safety/medical concerns         Wheelchair 50 feet with 2 turns activity    Assist    Wheelchair 50 feet with 2 turns activity did not occur: Safety/medical concerns       Wheelchair 150 feet activity     Assist  Wheelchair 150 feet activity did not occur: Safety/medical concerns       Blood pressure 113/66, pulse 85, temperature 98.3 F (36.8 C), resp. rate 16, height '5\' 6"'$  (1.676 m), weight 83 kg, SpO2 100 %.  Medical Problem List and Plan: 1.  Altered mental status with decreased functional mobility/left lower extremity weakness secondary to large right ACA and small left ACA infarct likely related to recent ACOM aneurysm post coiling and stenting             -patient may shower             -ELOS/Goals: 10-14 days, supervision goals with PT, OT, SLP  -Continue CIR therapies including PT, OT, and SLP   2.  Impaired mobility -DVT/anticoagulation: Continue SCDs             -antiplatelet therapy: Brilinta 90 mg twice daily, aspirin 81 mg daily 3. Pain Management: Lidoderm patch as directed, Tylenol as needed   - denies pain-   4. Mood: Team to provide emotional support as necessary             -antipsychotic agents: N/A 5. Neuropsych: This patient is not capable of making decisions on her own behalf. 6. Skin/Wound Care: Routine skin checks 7. Fluids/Electrolytes/Nutrition: Routine in and outs               -still eating well  -recheck labs tomorrow 8/2 8.  Hyperlipidemia.  Continue Lipitor 9.  Hypertension.  Monitor with increased mobility     Vitals:   09/19/20 1923 09/20/20 0333  BP: 115/71 113/66  Pulse: (!) 104 85  Resp: 16 16  Temp: 98.8 F (37.1 C) 98.3 F (36.8 C)  SpO2: 97% 100%         controlled 8/2 10.  History of tobacco abuse.  Continue counseling  11. Poor initiation/concentration-   7/25 increased ritalin to '15mg'$  bid---with some improvement 12. Constipation -Continue  Senokot 1 tab daily for daily regimen     -last bm 7/28----sorbitol today  LOS: 17 days A FACE TO FACE EVALUATION WAS PERFORMED  [name] 09/20/2020, 9:56 AM

## 2020-09-20 NOTE — Progress Notes (Signed)
Patient ID: Betty Archer, female   DOB: [birth year], 60 y.o.   MRN: [id number]  SW left message for [name] [phone]) to inform on DME: RW, and outpatient therapy recommendations. SW also requested follow-up about scheduling family edu. SW waiting on follow-up.  Loralee Pacas, MSW, [place] Office: [phone] Cell: [phone] Fax: [phone]

## 2020-09-20 NOTE — Progress Notes (Addendum)
Physical Therapy Weekly Progress Note  Patient Details  Name: Betty Archer MRN: [id number] Date of Birth: March 30, 1960  Beginning of progress report period: September 13, 2020 End of progress report period: September 20, 2020  Today's Date: 09/20/2020 PT Individual Time: 1410-1455 PT Individual Time Calculation (min): 45 min   Patient has met 2 of 3 short term goals.  Patient with great progress this week. Patient is performing all mobility at CGA-supervision level without AD, able to perform 16 steps using L rail with CGA. Will focus on d/c planning and family education this week. Patient unable to perform Berg Balance test this week due to bowl urgency/frequency, will assess prior to d/c.   Patient continues to demonstrate the following deficits muscle weakness, decreased cardiorespiratoy endurance, decreased attention, decreased awareness, decreased problem solving, decreased safety awareness, and decreased memory, and decreased standing balance, decreased postural control, decreased balance strategies, and difficulty maintaining precautions and therefore will continue to benefit from skilled PT intervention to increase functional independence with mobility.  Patient progressing toward long term goals.  Continue plan of care.  PT Short Term Goals Week 2:  PT Short Term Goal 1 (Week 2): Patient will perform basic transfers with supervision >75% of the time. PT Short Term Goal 1 - Progress (Week 2): Met PT Short Term Goal 2 (Week 2): Patient will perform 16 steps using 1 rail with min A. PT Short Term Goal 2 - Progress (Week 2): Met PT Short Term Goal 3 (Week 2): Patient will improve Berg Balance scale by at least 5 point sto meet the MCID. PT Short Term Goal 3 - Progress (Week 2): Progressing toward goal Week 3:  PT Short Term Goal 1 (Week 3): STG=LTG due to ELOS.  Skilled Therapeutic Interventions/Progress Updates:     Patient in recliner with her son in the room upon PT arrival. Patient alert  and agreeable to PT session. Patient denied pain during session.  Therapeutic Activity: Transfers: Patient performed sit to/from stand x4 with mod I with and without RW. She performed ambulatory transfers with CGA with and without RW to/from the bathroom. Patient with continent of bowl x1 and incontinent/continent of loose bowl x1 during session. Patient required prompting to go to the bathroom, as she was grabbing her bottom indicating sensation of BM without self-initiation or communication that she need to go to the bathroom on both occurences. Performed peri-care independently with increased time following first BM, required total A for thoroughness due to extent of loose BM following second BM. Doffed/donned incontinence brief with total A and performed management of pants independently. Patient internally and externally distracted during toileting, humming to herself, counting tiles on the wall, and perseverating on folding toilet paper. Performed hand hygiene standing at the sink with supervision x2. Patient perseverative on wiping down the sink requiring mod cues to redirect and terminate task.  Gait Training: Patient ambulated >200 feet using RW with supervision. Ambulated with improved, but still decreased, gait speed, narrow BOS, and reduced veering with use of RW. Provided verbal cues for attention to task and increased BOS.  Attempted Berg Balance Test x2, however patient unable to complete test due to bowl urgency x2. Patient missed 15 min of skilled PT due to bowl frequency/fatigue, RN made aware. Will attempt to make-up missed time as able.    Patient in recliner with her son in the room at end of session with breaks locked, seat belt alarm set, and all needs within reach.   Therapy Documentation Precautions:  Precautions Precautions: Fall Precaution Comments: L hemi with LLE>LUE, cognitive deficits, falls asleep easily Restrictions Weight Bearing Restrictions: No General: PT  Amount of Missed Time (min): 15 Minutes PT Missed Treatment Reason: Toileting;Patient fatigue   Therapy/Group: Individual Therapy  Carah Barrientes L Viveca Beckstrom PT, DPT  09/20/2020, 5:31 PM

## 2020-09-21 LAB — CBC
HCT: 30.4 % — ABNORMAL LOW (ref 36.0–46.0)
Hemoglobin: 9.3 g/dL — ABNORMAL LOW (ref 12.0–15.0)
MCH: 24.8 pg — ABNORMAL LOW (ref 26.0–34.0)
MCHC: 30.6 g/dL (ref 30.0–36.0)
MCV: 81.1 fL (ref 80.0–100.0)
Platelets: 254 10*3/uL (ref 150–400)
RBC: 3.75 MIL/uL — ABNORMAL LOW (ref 3.87–5.11)
RDW: 16.6 % — ABNORMAL HIGH (ref 11.5–15.5)
WBC: 6 10*3/uL (ref 4.0–10.5)
nRBC: 0 % (ref 0.0–0.2)

## 2020-09-21 LAB — BASIC METABOLIC PANEL
Anion gap: 8 (ref 5–15)
BUN: 12 mg/dL (ref 6–20)
CO2: 28 mmol/L (ref 22–32)
Calcium: 9.5 mg/dL (ref 8.9–10.3)
Chloride: 104 mmol/L (ref 98–111)
Creatinine, Ser: 0.73 mg/dL (ref 0.44–1.00)
GFR, Estimated: >60 mL/min (ref >60)
Glucose, Bld: 94 mg/dL (ref 70–99)
Potassium: 4.2 mmol/L (ref 3.5–5.1)
Sodium: 140 mmol/L (ref 135–145)

## 2020-09-21 NOTE — Progress Notes (Signed)
Occupational Therapy Session Note  Patient Details  Name: Betty Archer MRN: [id number] Date of Birth: 12/16/1960  Today's Date: 09/21/2020 OT Individual Time: 1002-1030 OT Individual Time Calculation (min): 28 min   Short Term Goals: Week 3:  OT Short Term Goal 1 (Week 3): STG=LTG d/t ELOS  Skilled Therapeutic Interventions/Progress Updates:    Pt greeted sitting in recliner and agreeable to OT treatment session. Pt declined need to go to the bathroom or perform BADL tasks. Pt ambulated to therapy dayroom with CGA. Standing balance and UB there-ex using 1 lb and 4 lb dowel rod, 3 sets of 10 chest press, bicep curl, and straight arm raises. Seated ball toss with oblique twist and ball volley using 1 lb dowel rod. Hallway scavenger hunt while walking back to room. Pt left seated in recliner with alarm belt on, call bell in reach and needs met.   Therapy Documentation Precautions:  Precautions Precautions: Fall Precaution Comments: L hemi with LLE>LUE, cognitive deficits, falls asleep easily Restrictions Weight Bearing Restrictions: No Pain: Pain Assessment Pain Scale: 0-10 Pain Score: 0-No pain   Therapy/Group: Individual Therapy  Valma Cava 09/21/2020, 10:27 AM

## 2020-09-21 NOTE — Progress Notes (Signed)
Occupational Therapy Session Note  Patient Details  Name: Betty Archer MRN: [id number] Date of Birth: Jun 15, 1960  Today's Date: 09/21/2020 OT Individual Time: 1130-1200 OT Individual Time Calculation (min): 30 min    Short Term Goals:  Week 3:  OT Short Term Goal 1 (Week 3): STG=LTG d/t ELOS  Skilled Therapeutic Interventions/Progress Updates:    Pt received sitting in the recliner with no c/o pain. Pt completed ambulatory transfer to the bathroom with supervision with the RW. Pt required cueing for initiation of bathroom tasks. Pt incontinent. Transfer into shower with supervision. All bathing with distant supervision. Session focused on increasing speed/efficiency of ADLs- pt successful with decreasing perseveration with min cueing. Pt dressed with supervision overall. Pt left sitting up with all needs met, chair alarm set.   Therapy Documentation Precautions:  Precautions Precautions: Fall Precaution Comments: L hemi with LLE>LUE, cognitive deficits, falls asleep easily Restrictions Weight Bearing Restrictions: No  Therapy/Group: Individual Therapy  Curtis Sites 09/21/2020, 6:25 AM

## 2020-09-21 NOTE — Progress Notes (Addendum)
Patient ID: Betty Archer, female   DOB: 11/24/1960, 60 y.o.   MRN: [id number]  SW met with pt and pt sister in room to provide updates from team conference while in room. SW shared challenges with getting in contact with pt son. Patient's other sister Betty Archer was called while SW in room. SW shared updates on above. She will make efforts to make contact with pt son Betty Archer. Reports that her son Betty Archer will be here today. Pt sister asked if TTB was needed. SW informed will discuss with therapy to get updates.   SW returned phone call to pt son Betty Archer [phone]) to provide updates from team conference on gains made- continued confusion, assistance with toileting schedule, and 24/7 care at discharge; DME recs: RW and outpatient therapies, and family education. Reports he can be here tomorrow for family edu. SW encouraged pt son f/u this evening with nursing to get pt therapy schedule since not finished. SW explained outpatient therapy process  with regard to setting up payment arrangement, and requesting financial assistance. Reports concerns if too expensive. SW encouraged him to have the appointment scheduled and determine if it is not feasible after speaking with the outpatient clinic. Preferred OPT clinic is [facility] [employer]. SW also shared about hospital follow-up/new pt appointment scheduled, and the appointment card left in room. SW will f/u to confirm if TTB is needed.   *therapy confirms a TTB is needed.   Loralee Pacas, MSW, [place] Office: [phone] Cell: [phone] Fax: [phone]

## 2020-09-21 NOTE — Progress Notes (Signed)
Physical Therapy Session Note  Patient Details  Name: Betty Archer MRN: [id number] Date of Birth: 1960-09-17  Today's Date: 09/21/2020 PT Individual Time: 0905-1000 PT Individual Time Calculation (min): 55 min   Short Term Goals: Week 3:  PT Short Term Goal 1 (Week 3): STG=LTG due to ELOS.  Skilled Therapeutic Interventions/Progress Updates:     Patient in recliner in the room upon PT arrival. Patient alert and agreeable to PT session. Patient denied pain during session. Patient agreeable to going outside during session.   Therapeutic Activity: Transfers: Patient performed sit to/from stand with mod I throughout session without AD.   Community Integration:  Patient ambulated from her room to the piano in the Atrium, outside the Atrium over unlevel surfaces <150 ft x2, and back to her room. Instructed patient to use path finding strategies to navigate to the [employer] to locate the Atrium with mod cues to locate signs on the walls to problem solve where to go. She did not require any cues for path finding on return to her room. Patient did not engage in opportunities for social interaction with others and maintained flat affect throughout activity. Patient self selected to play the piano, despite not knowing how to play. Patient performed hand hygiene before and after using the piano. She played the same 4 notes perseveratively, then would change her hand position to play 4 new notes in repetition >5 min before needing max cuing for redirection from task.  Neuromuscular Re-ed: Patient performed the [facility] Test, see details below: Patient demonstrates increased fall risk as noted by score of 47/56 on Berg Balance Scale.  (<36= high risk for falls, close to 100%; 37-45 significant >80%; 46-51 moderate >50%; 52-55 lower >25%) Five times Sit to Stand Test (FTSS) Method: Use a straight back chair with a solid seat that is 16-18" high. Ask participant to sit on the chair with arms folded  across their chest.   Instructions: "Stand up and sit down as quickly as possible 5 times, keeping your arms folded across your chest."   Measurement: Stop timing when the participant stands the 5th time.  TIME: __17____ (in seconds)  Times > 13.6 seconds is associated with increased disability and morbidity (Guralnik, 2000) Times > 15 seconds is predictive of recurrent falls in healthy individuals aged 4 and older (Buatois, et al., 2008) Normal performance values in community dwelling individuals aged 52 and older (Bohannon, 2006): 60-69 years: 11.4 seconds 70-79 years: 12.6 seconds 80-89 years: 14.8 seconds  MCID: ? 2.3 seconds for Vestibular Disorders Mariah Milling, 2006)  Educated patient on results and interpretation of assessments performed. Patient improved from 33/56 to 47/56 on the Berg this week demonstrating moderate fall risk. Recommended use of RW at home to reduce fall risk at d/c, patient in agreement.   Patient in recliner in the room at end of session with breaks locked, seat belt alarm set, and all needs within reach. Patient documented events of the session in her memory notebook at end of session.   Therapy Documentation Precautions:  Precautions Precautions: Fall Precaution Comments: L hemi with LLE>LUE, cognitive deficits, falls asleep easily Restrictions Weight Bearing Restrictions: No Balance: Balance Balance Assessed: Yes Standardized Balance Assessment Standardized Balance Assessment: Berg Balance Test Berg Balance Test Sit to Stand: Able to stand without using hands and stabilize independently Standing Unsupported: Able to stand safely 2 minutes Sitting with Back Unsupported but Feet Supported on Floor or Stool: Able to sit safely and securely 2 minutes Stand to  Sit: Sits safely with minimal use of hands Transfers: Able to transfer safely, minor use of hands Standing Unsupported with Eyes Closed: Able to stand 10 seconds with supervision Standing  Ubsupported with Feet Together: Able to place feet together independently and stand 1 minute safely From Standing, Reach Forward with Outstretched Arm: Can reach forward >12 cm safely (5") From Standing Position, Pick up Object from Floor: Able to pick up shoe, needs supervision From Standing Position, Turn to Look Behind Over each Shoulder: Looks behind from both sides and weight shifts well Turn 360 Degrees: Able to turn 360 degrees safely but slowly Standing Unsupported, Alternately Place Feet on Step/Stool: Able to complete 4 steps without aid or supervision Standing Unsupported, One Foot in Front: Able to plae foot ahead of the other independently and hold 30 seconds Standing on One Leg: Able to lift leg independently and hold 5-10 seconds Total Score: 47/56    Therapy/Group: Individual Therapy  Ainsleigh Kakos L Riordan Walle PT, DPT  09/21/2020, 4:26 PM

## 2020-09-21 NOTE — Progress Notes (Signed)
Speech Language Pathology Daily Session Note  Patient Details  Name: SEPHORA BENZON MRN: [id number] Date of Birth: [birth year]  Today's Date: 09/21/2020 SLP Individual Time: 0725-0825 SLP Individual Time Calculation (min): 60 min  Short Term Goals: Week 3: SLP Short Term Goal 1 (Week 3): STGs=LTGs due to ELOS  Skilled Therapeutic Interventions: Skilled treatment session focused on cognitive goals. Upon arrival, patient was awake in bed and had been incontinent of urine. Patient ambulated to the bathroom to change her brief. Patient then sat on the toilet and reported she need to void again. However, patient was unsuccessful but did not initiate getting up from commode or letting clinician know she was through. SLP had to ask patient if she was done in order to initiate pulling up pants. Patient participated in a basic money management task with Max A verbal cues needed for problem solving and error awareness. Patient attempted to use the calculator but required step by step instructions for use as well as Max A multimodal cues for error awareness despite absurdity of answer. Patient became restless in the recliner and upon questioning from clinician, patient reported she needed to use the commode. Patient was then again incontinent of urine. Patient completed all basic self-care tasks with Min verbal cues for initiation and cessation of tasks. Patient left upright in recliner with alarm on and all needs within reach. Continue with current plan of care.      Pain No/Denies Pain   Therapy/Group: Individual Therapy  Kendrell Lottman 09/21/2020, 12:41 PM

## 2020-09-21 NOTE — Patient Care Conference (Signed)
Inpatient RehabilitationTeam Conference and Plan of Care Update Date: 09/21/2020   Time: 10:00 AM    Patient Name: Betty Archer Record Number: [id number]  Date of Birth: December 23, 1960 Sex: Female         Room/Bed: 4W02C/4W02C-01 Payor Info: Payor: /    Admit Date/Time:  09/03/2020  2:06 PM  Primary Diagnosis:  Acute ischemic cerebrovascular accident (CVA) involving anterior cerebral artery territory [facility])  Hospital Problems: Principal Problem:   Acute ischemic cerebrovascular accident (CVA) involving anterior cerebral artery territory [facility])    Expected Discharge Date: Expected Discharge Date: 09/24/20  Team Members Present: Physician leading conference: Dr. Alger Simons Social Worker Present: Loralee Pacas, [place] Nurse Present: Dorthula Nettles, RN PT Present: Apolinar Junes, PT OT Present: Laverle Hobby, OT SLP Present: Weston Anna, SLP PPS Coordinator present : Gunnar Fusi, SLP     Current Status/Progress Goal Weekly Team Focus  Bowel/Bladder   intermittent incontinence of bladder, continent of bowel, LBM 8/1; refused timed toileting  continent of bladder/bowel  continue to encourage/offer timed toileting   Swallow/Nutrition/ Hydration             ADL's   (S) UB ADLS, CGA LB ADLs, supervision transfers, poor safety awareness  CGA- supervision  Initiation/termination of tasks, attention, ADLs, transfers, cognition   Mobility   CGA-supervision with RW, intermittent min A without AD, gait >750 ft, 16 steps L rail  CGA-supervision overall  Family training, d/c planning, HEP, safety awareness, gait and stair training, activity tolerance, attention, balance, patient/caregiver education   Communication             Safety/Cognition/ Behavioral Observations  Min-Mod A  Min A  attention, awareness, safety, family education   Pain   occasional pain in lower abdomen/R groin  pain 0/10  assess pain Q shift and PRN   Skin   ecchymosis R groin  no skin  breakdown and improvement of  ecchymosis  skin assess Q shift and PRN     Discharge Planning:  Pt uninsured. Pt to d/c to home with support from sons/various family.   Team Discussion: Doing well medically. Incontinent bladder, continent bowel. No pain reported. Bruising to right hip. Confused with telesitter in place. Supervision upper body, contact guard lower body. Supervision RW with transfers. Still impulsive. Attention/poor awareness. Contact guard without RW, supervision with RW. Pulls at briefs, already incontinent of urine.  Patient on target to meet rehab goals: yes, contact guard to supervision goals. Will need timed toileting schedule at discharge.  *See Care Plan and progress notes for long and short-term goals.   Revisions to Treatment Plan:  Medically ready for discharge.  Teaching Needs: Family education, medication management, skin/wound care, incontinence management, transfer training, gait training, balance training, endurance training, safety awareness.  Current Barriers to Discharge: Decreased caregiver support, Medical stability, Home enviroment access/layout, Incontinence, Lack of/limited family support, Medication compliance, and Behavior  Possible Resolutions to Barriers: Continue current medications, provide emotional support.     Medical Summary Current Status: gradually improving attention. still with confusion. constipated but moved bowels yesterday  Barriers to Discharge: Medical stability   Possible Resolutions to [employer] Focus: daily assessment of labs/ pt data, adjustment of neuro-psych medications   Continued Need for Acute Rehabilitation Level of Care: The patient requires daily medical management by a physician with specialized training in physical medicine and rehabilitation for the following reasons: Direction of a multidisciplinary physical rehabilitation program to maximize functional independence : Yes Medical management  of patient  stability for increased activity during participation in an intensive rehabilitation regime.: Yes Analysis of laboratory values and/or radiology reports with any subsequent need for medication adjustment and/or medical intervention. : Yes   I attest that I was present, lead the team conference, and concur with the assessment and plan of the team.   Cristi Loron 09/21/2020, 12:34 PM

## 2020-09-22 NOTE — Progress Notes (Signed)
Speech Language Pathology Daily Session Note  Patient Details  Name: CHATOYA STENGER MRN: [id number] Date of Birth: 11-10-1960  Today's Date: 09/22/2020 SLP Individual Time: 0725-0825 SLP Individual Time Calculation (min): 60 min  Short Term Goals: Week 3: SLP Short Term Goal 1 (Week 3): STGs=LTGs due to ELOS  Skilled Therapeutic Interventions: Skilled treatment session focused on cognitive goals. SLP facilitated session by providing education to the patient and her son regarding patient's current cognitive functioning and strategies to utilize at home to maximize attention, recall, problem solving and overall safety. Patient's son verbalized understanding and provided appropriate cueing throughout. SLP also facilitated session by providing a task while utilizing time pressure management. Patient demonstrated increased sustained attention to task and was able to generate 7/9 money amounts with Min verbal cues needed to self-monitor and correct errors. Patient left upright in bed with alarm on and all needs within reach. Continue with current plan of care.      Pain No/Denies Pain   Therapy/Group: Individual Therapy  Ramiro Pangilinan 09/22/2020, 3:14 PM

## 2020-09-22 NOTE — Progress Notes (Signed)
Physical Therapy Session Note  Patient Details  Name: RENLEIGH MEDIC MRN: [id number] Date of Birth: Nov 01, 1960  Today's Date: 09/22/2020 PT Individual Time: 1117-1205 PT Individual Time Calculation (min): 48 min   Short Term Goals: Week 3:  PT Short Term Goal 1 (Week 3): STG=LTG due to ELOS.  Skilled Therapeutic Interventions/Progress Updates:     Patient sitting EOB with her son, [name], present for family education upon PT arrival. Patient alert and agreeable to PT session. Patient denied pain during session.  Therapeutic Activity: Transfers: Patient performed all basic transfers with supervision-mod I with x1 L LOB on first transfer as patient attempted to stand impulsively with B hands on the RW. Patient able to recover by returning to sitting to correct stand without additional assist.  Performed ambulatory toilet transfer with supervision using RW. Patient was incontinent of bladder without awareness of incontinence during session. Performed peri-care and lower body clothing management with set-up assist, except incontinence brief doffed/donned with total A. Performed hand hygiene after with supervision, provided 1 min time limit to complete for improved self regulation and required min cues for termination of task with time constraint.  Patient performed a simulated sedan height car transfer with supervision using RW. Provided cues for safe technique. Reinforced that patient would not be driving at d/c and to follow-up with MD about possibilities of returning to driving. Educated on patient deficits in attention, awareness, coordination, and reaction time that greatly impair her ability to drive safely at this time. Patient and her son stated understanding.  PT demonstrated a floor transfer from sitting<>lying. Educated on signs and symptoms to assess during self-assessment in the event of a fall and symptoms that indicate immediate medical attention. Instructed patient to have his phone in  his pocket at all times to have access to call emergency services and not to get up if any sign of critical injury, symptoms, or LOC. Then instructed patient if no signs or symptoms present, he should call for assistance from another person, crawl to the nearest stable place to sit and perform a transfer from the floor to the seat, not to standing for safety to reduce risk of a second fall. Patient then performed a floor transfer from mat table<>mat on the floor with supervision demonstrating good technique following PT instruction.   Gait Training:  Patient ambulated >300, >100 feet, and >150 feet using RW on the first and last trials with supervision and without AD on second trial with CGA-close supervision. Ambulated with mildly decreased gait speed, decreased arm swing, narrow BOS, and improved postural control this session. Provided verbal cues for attention to task, increased arm swing for improved gait speed and balance without an AD, and increased BOS. Patient ascended/descended 22 steps, 2 flights in stair well, using L rail with CGA-close supervision. Performed reciprocal gait pattern throughout. Provided cues for safe guarding technique.  Patient ambulated up/down a ramp, over 10 feet of mulch (unlevel surface), and up/down a curb to simulate community ambulation over unlevel surfaces with close supervision using RW. Provided cues for technique and use of AD.  Educated patient and her son on fall risk/prevention, home modifications to prevent falls, and activation of emergency services in the event of a fall during session. Recommended use of RW with all functional mobility at d/c, patient safe to work on gait without AD in quiet environment on level surface, such as apartment hallway with [place] with family. Patient and her son stated understanding.  Patient's son performed safe guarding and  cuing with min cues for technique throughout session.  Patient in recliner in the room at end of session  with breaks locked, seat belt alarm set, Telesitter in room, and all needs within reach.   Therapy Documentation Precautions:  Precautions Precautions: Fall Precaution Comments: L hemi- mild Restrictions Weight Bearing Restrictions: No    Therapy/Group: Individual Therapy  Larz Mark L Dillyn Menna PT, DPT  09/22/2020, 8:16 PM

## 2020-09-22 NOTE — Progress Notes (Signed)
Occupational Therapy Session Note  Patient Details  Name: MARJARIE IRION MRN: [id number] Date of Birth: [birth year]  Today's Date: 09/22/2020 OT Individual Time: 0955-1100 OT Individual Time Calculation (min): 65 min    Short Term Goals: Week 3:  OT Short Term Goal 1 (Week 3): STG=LTG d/t ELOS  Skilled Therapeutic Interventions/Progress Updates:    Pt received supine with her son [place] present for family edu. Introduced OT and explained plan for family edu- [place] immediately declined participation in any ADL activity stating "that's my mom, I know how to take care of her". Education provided throughout re supervision level ADLs and general fall risk reduction and energy conservation. Stressed need for shower chair. Pt reported no need for toileting but as she approached toilet, she began rushing to sit down and had continent void. Pt completed toileting tasks with supervision. Supervision transfer to shower and bathing. Pt required intermittent cueing for termination of tasks and safety awareness. Pt donned clothes with supervision. HEP provided to pt for BUE, focus on LUE NMR, as well as theraputty instructions. Provided demo to pt and her son, access code below. Pt was left sitting up with all needs met, son present.   Access Code: [email or web address]  Therapy Documentation Precautions:  Precautions Precautions: Fall Precaution Comments: L hemi with LLE>LUE, cognitive deficits, falls asleep easily Restrictions Weight Bearing Restrictions: No   Therapy/Group: Individual Therapy  Curtis Sites 09/22/2020, 6:12 AM

## 2020-09-22 NOTE — Progress Notes (Signed)
Occupational Therapy Discharge Summary  Patient Details  Name: Betty Archer MRN: [id number] Date of Birth: 1960/10/31   Patient has met 12 of 12 long term goals due to improved activity tolerance, improved balance, postural control, ability to compensate for deficits, functional use of  LEFT upper and LEFT lower extremity, improved attention, improved awareness, and improved coordination.  Patient to discharge at overall Supervision level.  Patient's care partner is independent to provide the necessary cognitive assistance at discharge.    Reasons goals not met: All goals met  Recommendation:  Patient will benefit from ongoing skilled OT services in home health setting to continue to advance functional skills in the area of BADL.  Equipment: No equipment provided  Reasons for discharge: treatment goals met and discharge from hospital  Patient/family agrees with progress made and goals achieved: Yes  OT Discharge Precautions/Restrictions  Precautions Precautions: Fall Precaution Comments: L hemi- mild Restrictions Weight Bearing Restrictions: No   ADL ADL Eating: Supervision/safety Where Assessed-Eating: Edge of bed Grooming: Supervision/safety Where Assessed-Grooming: Standing at sink Upper Body Bathing: Supervision/safety Where Assessed-Upper Body Bathing: Shower Lower Body Bathing: Supervision/safety Where Assessed-Lower Body Bathing: Shower Upper Body Dressing: Supervision/safety Where Assessed-Upper Body Dressing: Edge of bed Lower Body Dressing: Dependent Where Assessed-Lower Body Dressing: Sitting at sink Toileting: Supervision/safety Where Assessed-Toileting: Glass blower/designer: Close supervision Toilet Transfer Method: Counselling psychologist: Energy manager: Not assessed Social research officer, government: Close supervision Social research officer, government Method: Ambulating Vision Baseline Vision/History: Wears glasses Wears Glasses: Reading  only Patient Visual Report: No change from baseline Eye Alignment: Within Functional Limits Ocular Range of Motion: Within Functional Limits Alignment/Gaze Preference: Within Defined Limits Tracking/Visual Pursuits: Able to track stimulus in all quads without difficulty Saccades: Within functional limits Convergence: Within functional limits Visual Fields: No apparent deficits Perception    Praxis   Cognition Overall Cognitive Status: Impaired/Different from baseline Arousal/Alertness: Awake/alert Orientation Level: Oriented X4 Focused Attention: Impaired Memory: Impaired Awareness Impairment: Intellectual impairment Problem Solving: Impaired Behaviors: Perseveration Safety/Judgment: Impaired Sensation Sensation Light Touch: Appears Intact Hot/Cold: Appears Intact Proprioception: Impaired by gross assessment Stereognosis: Appears Intact Coordination Gross Motor Movements are Fluid and Coordinated: Yes Fine Motor Movements are Fluid and Coordinated: Yes Coordination and Movement Description: occasional posterior bias wiht STS Motor  Motor Motor: Hemiplegia Motor - Skilled Clinical Observations: very mild L hemi Mobility  Bed Mobility Rolling Right: Independent Right Sidelying to Sit: Independent Supine to Sit: Independent Sit to Supine: Independent Transfers Sit to Stand: Independent Stand to Sit: Independent  Trunk/Postural Assessment  Cervical Assessment Cervical Assessment: Within Functional Limits Thoracic Assessment Thoracic Assessment: Within Functional Limits Lumbar Assessment Lumbar Assessment: Within Functional Limits Postural Control Postural Control: Deficits on evaluation (delayed)  Balance Balance Balance Assessed: Yes Static Sitting Balance Static Sitting - Level of Assistance: 7: Independent Dynamic Sitting Balance Dynamic Sitting - Level of Assistance: 7: Independent Static Standing Balance Static Standing - Level of Assistance: 6:  Modified independent (Device/Increase time) Dynamic Standing Balance Dynamic Standing - Level of Assistance: 5: Stand by assistance Extremity/Trunk Assessment RUE Assessment RUE Assessment: Within Functional Limits LUE Assessment LUE Assessment: Exceptions to [facility] Active Range of Motion (AROM) Comments: 3/4 to full shoulder flexion LUE Body System: Neuro Brunstrum level for arm: Stage V Relative Independence from Synergy Brunstrum level for hand: Stage VI Isolated joint movements   Lowella Dell Kaleen Rochette 09/22/2020, 7:00 AM

## 2020-09-22 NOTE — Progress Notes (Signed)
Pt slept fairly well throughout the night. Continues to be impulsive at times, attempting to get out of chair unassisted. Given tylenol 650 mg x1 for c/o bilateral leg pain-effective.

## 2020-09-22 NOTE — Progress Notes (Signed)
Physical Therapy Session Note  Patient Details  Name: Betty Archer MRN: [id number] Date of Birth: 1960-03-09  Today's Date: 09/22/2020 PT Individual Time: [id number] PT Individual Time Calculation (min): 27 min   Short Term Goals: Week 3:  PT Short Term Goal 1 (Week 3): STG=LTG due to ELOS.  Skilled Therapeutic Interventions/Progress Updates:     Pt received seated in recliner and does not appear to notice that therapist is in room because she is distracted by her cell phone, tapping on the screen perseveratively. PT approaches pt and she glances up but continues to tap on cell phone screen. PT uses verbal and tactile cues to redirect pt but pt continues to tap. Cell phone removed from pt's hands and pt finally redirects attention to therapist. Agreeable to therapy. No complaint of pain. Pt perform sit to stand with CGA and ambulates x150' to to gym with CGA at hips and cues for upright gaze and increasing gait speed. Pt then performs obstacle course in gym to challenge direction following, short term memory, and dynamic balance. Pt requires minA in obstacle course, not for balance, but to correctly weave in and out of cones and maintain on task. Pt progresses activity by counting up by 1s and then counting up by 2s, also tasked with stepping over hurdles in the process. Pt requires CGA to minA throughout with cues to redirect to task. Pt ambulates back to room with CGA. Upon entering room pt sees cell phone, stops and picks it up, and begins tapping incessantly. Before PT can redirect pt grabs groin and rushes to bathroom, requiring cues for safety. Pt sits on toilet but then exclaims that she's already urinated in her brief. PT assists with brief change and pt ambulates back to recliner with CGA. Left seated with alarm intact and all needs within reach.  Therapy Documentation Precautions:  Precautions Precautions: Fall Precaution Comments: L hemi- mild Restrictions Weight Bearing Restrictions:  No    Therapy/Group: Individual Therapy  Breck Coons, PT, DPT 09/22/2020, 3:43 PM

## 2020-09-23 MED ORDER — TICAGRELOR 90 MG PO TABS
90.0000 mg | ORAL_TABLET | Freq: Two times a day (BID) | ORAL | 0 refills | Status: DC
  Filled 2020-09-23: qty 60, 30d supply, fill #0

## 2020-09-23 MED ORDER — ATORVASTATIN CALCIUM 80 MG PO TABS
80.0000 mg | ORAL_TABLET | Freq: Every day | ORAL | 0 refills | Status: DC
  Filled 2020-09-23: qty 30, 30d supply, fill #0

## 2020-09-23 MED ORDER — HYDROCORTISONE (PERIANAL) 2.5 % EX CREA
TOPICAL_CREAM | Freq: Two times a day (BID) | CUTANEOUS | 0 refills | Status: DC | PRN
  Filled 2020-09-23: qty 30, 7d supply, fill #0

## 2020-09-23 MED ORDER — ACETAMINOPHEN 325 MG PO TABS
650.0000 mg | ORAL_TABLET | Freq: Four times a day (QID) | ORAL | 0 refills | Status: AC | PRN
  Filled 2020-09-23: qty 100, 13d supply, fill #0

## 2020-09-23 MED ORDER — LIDOCAINE 5 % EX PTCH
1.0000 | MEDICATED_PATCH | CUTANEOUS | 0 refills | Status: AC
  Filled 2020-09-23: qty 30, 30d supply, fill #0

## 2020-09-23 MED ORDER — ASPIRIN 81 MG PO CHEW
81.0000 mg | CHEWABLE_TABLET | Freq: Two times a day (BID) | ORAL | 0 refills | Status: AC
  Filled 2020-09-23: qty 100, 50d supply, fill #0

## 2020-09-23 MED ORDER — SENNOSIDES-DOCUSATE SODIUM 8.6-50 MG PO TABS
2.0000 | ORAL_TABLET | Freq: Every day | ORAL | 0 refills | Status: AC
  Filled 2020-09-23: qty 60, 30d supply, fill #0

## 2020-09-23 MED ORDER — METHYLPHENIDATE HCL 10 MG PO TABS
15.0000 mg | ORAL_TABLET | Freq: Two times a day (BID) | ORAL | 0 refills | Status: DC
Start: 2020-09-24 — End: 2020-10-21
  Filled 2020-09-23: qty 90, 30d supply, fill #0

## 2020-09-23 MED ORDER — POLYETHYLENE GLYCOL 3350 17 GM/SCOOP PO POWD
17.0000 g | Freq: Every day | ORAL | 0 refills | Status: DC
  Filled 2020-09-23: qty 510, 30d supply, fill #0

## 2020-09-23 NOTE — Progress Notes (Addendum)
Patient ID: Betty Archer, female   DOB: 25-Oct-1960, 60 y.o.   MRN: [id number]  SW faxed outpatient PT/OT/SLP referral to [facility] ([id number]).   Pt set up for [facility] medication assistance program.   SW left message for [name] to inform on outpatient therapies, and inquire if DME: RW, shower chair, BSC was being purchased by family. SW waiting on follow-up.   Loralee Pacas, MSW, [place] Office: [phone] Cell: [phone] Fax: [phone]

## 2020-09-23 NOTE — Progress Notes (Signed)
Occupational Therapy Session Note  Patient Details  Name: Betty Archer MRN: [id number] Date of Birth: 1960/04/12  Today's Date: 09/23/2020 OT Individual Time: 1002-1056 OT Individual Time Calculation (min): 54 min    Short Term Goals: Week 1:  OT Short Term Goal 1 (Week 1): Pt will complete STS in prep for standing ADL with consistent mod A in 2/3 trials. OT Short Term Goal 1 - Progress (Week 1): Met OT Short Term Goal 2 (Week 1): Pt will don pants with mod A + AE PRN. OT Short Term Goal 2 - Progress (Week 1): Met OT Short Term Goal 3 (Week 1): Pt will req no more than min VCs to terminate grooming task. OT Short Term Goal 3 - Progress (Week 1): Met OT Short Term Goal 4 (Week 1): Pt will maintain attention to task for at least 3 min with no more than min VCs. OT Short Term Goal 4 - Progress (Week 1): Progressing toward goal  Skilled Therapeutic Interventions/Progress Updates:    Pt received in recliner agreeable to OT. Pt endlessly poking phone in random places. Pt able to verbalize that she is doing the same thing over and over again and it is not working, but lacks awareness or problem solving to try another strategy. Pt requires up to 5 min to terminate taping phone and redirect to ADL. Pt completes BADL at shower level with supervision and VC for safety strategies to decrease fall risk, improve pacing, terminate tasks and relizing original pants selected are too small/will not fit. Pt fills out memory notebook, but then turns into diary entry talking about being "hungry." OT educates on use of memory notebook, but likely pt needs reinforcement. Exited session with pt seated in recliner, exit alarm on and call light in reach    Supervision Level Discharge Instructions:  General mobility- they should always use their RW. They may benefit from a walker tray to transport items from room to room if walking with a walker is recommended by physical therapy. The walker should be kept within  reach so they can pull it close to get up and keep with them to back up to any surface they want to sit on. When getting up, they should push up from the surface they are getting up from and reach back when sitting to a new surface, no plopping.  You are their "shadow." Especially in the beginning. You, as the helper should be in reach of the patient when mobilizing. You should be either beside or behind them so if they lose their balance you can assist by helping correct at the hips. This is likely closer than you are used to being- be in their personal space. Use a gait belt if that makes you feel more comfortable If you are attempting to get up/transfer and it is not going well, reset. Have them sit back down. Make sure they are close to the edge of the seat, feet are underneath them at hips distance, and they are leaning forward to stand up. Bathing- they should sit to bathe on a shower chair, especially for washing legs/feet. Sitting will save energy and increase safety. For a tub shower with shower chair: Use the walker to walk up to shower/tub edge and leave it to the side, but close. they can use the wall to steady as they step over or a grab bar. Do your best to dry off the floor prior to getting out of the shower Walk in shower: walk up  to the shower ledge, turn around and back up to the ledge with the walker. Keep both hands-on walker while they step back one foot at a time Dressing- all should be done from a SEATED level, especially to put underwear and pants over feet.  Toileting- the RW can be walked right over the toilet for standing urination if applicable. If seated toileting is more appropriate, have them walk up to the toilet and keep walker with them as they turn to sit to toilet or BSC. Before they stand to pull up pants past hips they should pull pants/underwear up past their knees to decrease the need to bend forward to the floor. Sometimes this makes people dizzy if  incontinence/bathroom accidents are an issue attempt to toilet every 2 hours to improve success with toileting and decrease accidents.  Energy conservation principles- Prioritize what needs to be done and what can be moved to another day Plan out their days, weeks, months to spread out taxing (physical or cognitively tiring) activities to not put too much at one time Pace activities- rest before feeling tired and have designated places to rest if they feel tired and need to take a brake Position for success: sit when able to conserve 25% more energy than standing   Therapy Documentation Precautions:  Precautions Precautions: Fall Precaution Comments: L hemi- mild Restrictions Weight Bearing Restrictions: No General:   Vital Signs: Therapy Vitals Temp: 98.5 F (36.9 C) Temp Source: Oral Pulse Rate: 88 Resp: 16 BP: 103/72 Patient Position (if appropriate): Lying Oxygen Therapy SpO2: 100 % O2 Device: Room Air Pain: Pain Assessment Pain Scale: 0-10 Pain Score: 3  Pain Type: Acute pain Pain Location: Leg Pain Orientation: Right;Left Pain Descriptors / Indicators: Aching Pain Frequency: Constant Pain Onset: Progressive Patients Stated Pain Goal: 0 Pain Intervention(s): Repositioned ADL: ADL Eating: Supervision/safety Where Assessed-Eating: Edge of bed Grooming: Supervision/safety Where Assessed-Grooming: Standing at sink Upper Body Bathing: Supervision/safety Where Assessed-Upper Body Bathing: Shower Lower Body Bathing: Supervision/safety Where Assessed-Lower Body Bathing: Shower Upper Body Dressing: Supervision/safety Where Assessed-Upper Body Dressing: Edge of bed Lower Body Dressing: Dependent Where Assessed-Lower Body Dressing: Sitting at sink Toileting: Supervision/safety Where Assessed-Toileting: Glass blower/designer: Close supervision Toilet Transfer Method: Counselling psychologist: Energy manager: Not assessed Financial planner: Close supervision Social research officer, government Method: [employer]    Praxis   Exercises:   Other Treatments:     Therapy/Group: Individual Therapy  Tonny Branch 09/23/2020, 6:52 AM

## 2020-09-23 NOTE — Progress Notes (Signed)
Patient slept fairly well. Given prn tylenol per request for bilateral leg pain-partial effects noted.

## 2020-09-23 NOTE — Progress Notes (Signed)
PROGRESS NOTE   Subjective/Complaints: Pt without new complaints.   ROS: Patient denies fever, rash, sore throat, blurred vision, nausea, vomiting, diarrhea, cough, shortness of breath or chest pain, joint or back pain, headache, or mood change.   Objective:   No results found. Recent Labs    09/21/20 0510  WBC 6.0  HGB 9.3*  HCT 30.4*  PLT 254    Recent Labs    09/21/20 0510  NA 140  K 4.2  CL 104  CO2 28  GLUCOSE 94  BUN 12  CREATININE 0.73  CALCIUM 9.5     Intake/Output Summary (Last 24 hours) at 09/23/2020 1351 Last data filed at 09/23/2020 1300 Gross per 24 hour  Intake 540 ml  Output --  Net 540 ml        Physical Exam: Vital Signs Blood pressure 110/71, pulse (!) 103, temperature 98.9 F (37.2 C), temperature source Oral, resp. rate 18, height '5\' 6"'$  (1.676 m), weight 83 kg, SpO2 100 %. Constitutional: No distress . Vital signs reviewed. HEENT: NCAT, EOMI, oral membranes moist Neck: supple Cardiovascular: RRR without murmur. No JVD    Respiratory/Chest: CTA Bilaterally without wheezes or rales. Normal effort    GI/Abdomen: BS +, non-tender, non-distended Ext: no clubbing, cyanosis, or edema Psych: pleasant and cooperative   Skin: No evidence of breakdown, no evidence of rash Musc:       General: No swelling or tenderness. Normal range of motion.    Cervical back: Neck supple. Neurological:    Comments: engages more. Still distractible. Follows commands.     Tracks to left and right. Moves RUE and RLE 5/5. 4/5 LUE and   4+/5LLE. Senses pain on all 4. Normal tone.        Assessment/Plan: 1. Functional deficits which require 3+ hours per day of interdisciplinary therapy in a comprehensive inpatient rehab setting. Physiatrist is providing close team supervision and 24 hour management of active medical problems listed below. Physiatrist and rehab team continue to assess barriers to  discharge/monitor patient progress toward functional and medical goals  Care Tool:  Bathing    Body parts bathed by patient: Right arm, Left arm, Chest, Abdomen, Face, Right upper leg, Left upper leg, Front perineal area, Buttocks, Right lower leg, Left lower leg     Body parts n/a: Front perineal area, Buttocks, Right lower leg, Left lower leg   Bathing assist Assist Level: Supervision/Verbal cueing     Upper Body Dressing/Undressing Upper body dressing   What is the patient wearing?: Pull over shirt    Upper body assist Assist Level: Independent with assistive device    Lower Body Dressing/Undressing Lower body dressing      What is the patient wearing?: Pants, Incontinence brief     Lower body assist Assist for lower body dressing: Supervision/Verbal cueing     Toileting Toileting Toileting Activity did not occur (Clothing management and hygiene only): N/A (no void or bm)  Toileting assist Assist for toileting: Supervision/Verbal cueing     Transfers Chair/bed transfer  Transfers assist     Chair/bed transfer assist level: Supervision/Verbal cueing     Locomotion Ambulation   Ambulation assist  Assist level: Minimal Assistance - Patient > 75% Assistive device: Walker-rolling Max distance: >200 ft   Walk 10 feet activity   Assist  Walk 10 feet activity did not occur: Safety/medical concerns  Assist level: Minimal Assistance - Patient > 75% Assistive device: Hand held assist   Walk 50 feet activity   Assist Walk 50 feet with 2 turns activity did not occur: Safety/medical concerns  Assist level: Minimal Assistance - Patient > 75% Assistive device: Hand held assist    Walk 150 feet activity   Assist Walk 150 feet activity did not occur: Safety/medical concerns  Assist level: Minimal Assistance - Patient > 75% Assistive device: Hand held assist    Walk 10 feet on uneven surface  activity   Assist Walk 10 feet on uneven surfaces  activity did not occur: Safety/medical concerns         Wheelchair     Assist Will patient use wheelchair at discharge?: Yes Type of Wheelchair: Manual Wheelchair activity did not occur: Safety/medical concerns         Wheelchair 50 feet with 2 turns activity    Assist    Wheelchair 50 feet with 2 turns activity did not occur: Safety/medical concerns       Wheelchair 150 feet activity     Assist  Wheelchair 150 feet activity did not occur: Safety/medical concerns       Blood pressure 110/71, pulse (!) 103, temperature 98.9 F (37.2 C), temperature source Oral, resp. rate 18, height '5\' 6"'$  (1.676 m), weight 83 kg, SpO2 100 %.  Medical Problem List and Plan: 1.  Altered mental status with decreased functional mobility/left lower extremity weakness secondary to large right ACA and small left ACA infarct likely related to recent ACOM aneurysm post coiling and stenting             -patient may shower             -ELOS/Goals: 10-14 days, supervision goals with PT, OT, SLP  -dc home tomorrow. Finalizing planning  2.  Impaired mobility -DVT/anticoagulation: Continue SCDs             -antiplatelet therapy: Brilinta 90 mg twice daily, aspirin 81 mg daily 3. Pain Management: Lidoderm patch as directed, Tylenol as needed   - denies pain-   4. Mood: Team to provide emotional support as necessary             -antipsychotic agents: N/A 5. Neuropsych: This patient is not capable of making decisions on her own behalf. 6. Skin/Wound Care: Routine skin checks 7. Fluids/Electrolytes/Nutrition: Routine in and outs               -still eating well 8.  Hyperlipidemia.  Continue Lipitor 9.  Hypertension.  Monitor with increased mobility     Vitals:   09/23/20 0417 09/23/20 1349  BP: 103/72 110/71  Pulse: 88 (!) 103  Resp: 16 18  Temp: 98.5 F (36.9 C) 98.9 F (37.2 C)  SpO2: 100% 100%         controlled 8/4 10.  History of tobacco abuse.  Continue counseling  11. Poor  initiation/concentration-     ritalin '15mg'$  bid---continue at discharge 12. Constipation -Continue Senokot 1 tab daily for daily regimen       LOS: 20 days A FACE TO FACE EVALUATION WAS PERFORMED  [name] 09/23/2020, 1:51 PM

## 2020-09-23 NOTE — Discharge Summary (Signed)
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 aneurysm projecting inferiorly measuring approximately 2.9 mm at the dome and 4 mm at the neck. There is brisk vascular contrast filling of the bilateral ACA and right MCA vascular trees. No abnormally high-flow, early draining veins are seen. No regions of abnormal hypervascularity are noted. The visualized dural sinuses are patent. PROCEDURE: The Berenstein 2 catheter was exchanged over the wire under biplane roadmap for a TrackStar guide catheter which was placed in the proximal cervical segment of the right ICA. A 6 [place] intermediate catheter was then navigated into the petrous segment of the right ICA. Magnified frontal and lateral views of the head were obtained in the working projections. Under biplane roadmap, a headway duo microcatheter was navigated over and narrow Aristotle 14 microwire into the right A1/ACA segment. Attempt to catheterize the left A2/ACA segment origin ating from the aneurysm proved unsuccessful due to extreme acute angle origin. The wire was then navigated into the aneurysm and then into the left A2/ACA segment. A 2.5 x 23 mm LVis intracranial stent was partially deployed into the left A2/ACA segment. The catheter loop  inside aneurysm was subsequently reduced and the proximal aspect of the stent was finally deployed into the right A1 segment. Magnified frontal and lateral angiograms in the working projections show adequate stent placement with brisk opacification of the left A2 segment. On the biplane roadmap, the headway duo microcatheter was then navigated over the microwire into the right A2/ACA segment. Attempted navigation of a second microcatheter into the aneurysm pouch proved unsuccessful. Then, a 3 x 24 mm neuroform atlas stent was deployed from the right A2 to the right A1/ACA segment. Magnified frontal and lateral angiograms showed adequate stent position. Attempted removal of the stent delivery wire proved unsuccessful due to significant resistance. The microcatheter and delivery wire were then retracted together. Follow-up right ICA angiogram  showed displacement of the proximal stent tines to the right ICA terminus. Under biplane roadmap, an SL 10 microcatheter was navigated over a synchro 2 micro guidewire into the anterior communicating artery aneurysm pouch. The microwire was removed and embolization of the aneurysm was carried out. A 5 mm x 10 cm target XL 360 soft, a 5 mm x 15 cm target XL 360 soft, a 4 mm x 6 cm target 360 ultra and a 3 mm x 6 cm target 360 ultra embolization coils were deployed within the aneurysm pouch. Control angiograms were obtained after each coil detachment. The microcatheter was subsequently withdrawn. Final magnified frontal and lateral views of the head showed adequate coil packing density and widely patent right A1 and bilateral A2 segments. Right ICA angiograms with full view of the head in frontal and lateral projections showed no evidence of thromboembolic complication. Flat panel CT of the head was obtained and post processed in a separate workstation with concurrent attending physician supervision. Selected images were sent to PACS. No evidence of hemorrhagic complication. During  intervention endovascular embolization, a large right groin hematoma developed around the femoral sheath which was controlled with manual pressure. Right common femoral artery angiograms were obtained with frontal and right anterior oblique views. Normal appearing right common femoral artery with no evidence of contrast extravasation. The 8 French femoral sheath was then exchanged for an 8 [place] Angio-Seal which was utilized for access closure followed by approximately 45 minutes of manual pressure. IMPRESSION: 1. 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 No thromboembolic or hemorrhagic complication related to the intracranial procedure. 2. Right common femoral artery access complicated by groin hematoma developed around the femoral sheath during the intervention, controlled by manual pressure during intervention, before sheath removal, and by an 8 French Angio-Seal and manual pressure after sheath removal. PLAN: Patient will return for consultation in approximately 3-4 weeks to discuss left MCA aneurysm embolization. Electronically Signed   By: Pedro Earls M.D.   On: 08/26/2020 12:13    Labs:  Basic Metabolic Panel: Recent Labs  Lab 09/21/20 0510  NA 140  K 4.2  CL 104  CO2 28  GLUCOSE 94  BUN 12  CREATININE 0.73  CALCIUM 9.5    CBC: Recent Labs  Lab 09/21/20 0510  WBC 6.0  HGB 9.3*  HCT 30.4*  MCV 81.1  PLT 254    CBG: No results for input(s): GLUCAP in the last 168 hours.  Family history.  Maternal grandmother with breast cancer maternal aunt with breast cancer.  Father with lung cancer.  Negative colon cancer esophageal cancer or stomach cancer  Brief HPI:   Betty Archer is a 60 y.o. right-handed female with history of hypertension hyperlipidemia tobacco use as well as a 6 mm right A2/ACA aneurysm and an 8 mm left MCA bifurcation  aneurysm identified after patient with complaints of headache and right arm numbness in June 2022 followed by neurology services underwent diagnostic cerebral angiogram and right aneurysmal embolization 08/25/2020 per interventional radiology and left MCA aneurysm unrepaired with course complicated by right groin hematoma that required prolonged initial intubation was discharged home 08/27/2020 maintained on aspirin as well as Brilinta.  Per chart review lives with her son.  Presented 08/31/2020 with altered mental status and lower extremity weakness.  Cranial CT scan negative for acute changes.  CT angiogram of head and neck no emergent large vessel occlusion  or high-grade stenosis.  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  Coil mass at the anterior communicating artery without visible residual aneurysmal filling.  MRI of the brain showed confluent right acute ACA territory infarction including involvement of the anterior basal ganglia and ischemia also in the left ACA territory with no associated hemorrhage or mass-effect.  Echocardiogram with ejection fraction of 65 to 34% grade 2 diastolic dysfunction no regional wall motion abnormalities.  Neurology follow-up maintained on aspirin as well as Brilinta for CVA prophylaxis.  Therapy evaluations completed due to patient decreased functional mobility was admitted for a comprehensive rehab program.   Hospital Course: TAWNEY VANORMAN was admitted to rehab 09/03/2020 for inpatient therapies to consist of PT, ST and OT at least three hours five days a week. Past admission physiatrist, therapy team and rehab RN have worked together to provide customized collaborative inpatient rehab.  Pertaining to patient's large right ACA and small left ACA infarction likely related to recent ACOEM aneurysm post coiling and stenting remained on aspirin as well as Brilinta.  Patient would follow neurology services.  Ritalin was added to patient's regimen to help sustain attention and  focus to task.  SCDs for DVT prophylaxis.  Pain managed with Lidoderm patch as directed.  Hyperlipidemia with Lipitor ongoing.   Blood pressures were monitored on TID basis and soft and monitored     Rehab course: During patient's stay in rehab weekly team conferences were held to monitor patient's progress, set goals and discuss barriers to discharge. At admission, patient required moderate assist supine to sit mod assist sit to stand max assist upper body bathing total assist lower body bathing  Physical exam.  Blood pressure 103/91 pulse 61 temperature 98.4 respirations 18 oxygen saturations 100% room air Constitutional.  No acute distress HEENT Head.  Normocephalic and atraumatic Eyes.  Pupils round and reactive to light no discharge without nystagmus Neck.  Supple nontender no JVD without thyromegaly Cardiac regular rate rhythm not extra sounds or murmur heard Abdomen.  Soft nontender positive bowel sounds without rebound Respiratory effort normal no respiratory distress without wheeze Musculoskeletal.  No swelling or tenderness Skin.  Warm and dry Neurologic.  Patient alert slow to arouse no acute distress makes eye contact with examiner.  Provides name and age.  Follows commands.  Tracks to the left and right.  Moves right upper extremity right lower extremity spontaneously.  3-4/5 left upper extremity and 2-3/5 left lower extremity without consistent activation.  He/She  has had improvement in activity tolerance, balance, postural control as well as ability to compensate for deficits. He/She has had improvement in functional use RUE/LUE  and RLE/LLE as well as improvement in awareness.  Patient performed sit to stand with contact-guard assist ambulates 150 feet to the gym with contact-guard assist.  Needs some cues for attention and maintenance of tasks.  Patient completes basic ADLs and shower letter with supervision and verbal cues for safety strategies.  SLP follow-up for cognitive  functioning and strategies to utilize at home to maximize attention recall problem solving and overall safety discussed with family need for supervision at home for safety.  Full family teaching completed and discharged to home       Disposition: Discharged to home    Diet: Regular  Special Instructions: No driving smoking or alcohol  Medications at discharge. 1.  Tylenol as needed 2.  Lidoderm patch as directed 3.  Ritalin 50 mg p.o. twice daily 4.  MiraLAX as needed 5.  Brilinta  90 mg p.o. twice daily 6.  Aspirin 81 mg p.o. daily 7.  Lipitor 80 mg p.o. nightly  30-35 minutes were spent completing discharge summary and discharge planning  Discharge Instructions     Ambulatory referral to Neurology   Complete by: As directed    Follow up with stroke clinic NP (Jessica Vanschaick or Cecille Rubin, if both not available, consider Zachery Dauer, or Ahern) at [facility] in about 4 weeks. Thanks.   Ambulatory referral to Neurology   Complete by: As directed    An appointment is requested in approximately: 4 weeks right ACA and small left ACA infarct related to [facility] with stenting and coiling   Ambulatory referral to Physical Medicine Rehab   Complete by: As directed    Moderate complexity follow-up 1 to 2 weeks right ACA/left ACA infarction        Follow-up Information     [facility]. Schedule an appointment as soon as possible for a visit in 1 month(s).   Specialty: Neurology Why: stroke clinic Contact information: [address] [place] [phone]        [name], MD Follow up.   Specialty: Physical Medicine and Rehabilitation Why: Office to call for appointment Contact information: [address] [place] [address] [phone]                 Signed: Cathlyn Parsons 09/23/2020, 1:07 PM

## 2020-09-23 NOTE — Progress Notes (Signed)
Physical Therapy Discharge Summary  Patient Details  Name: Betty Archer MRN: [id number] Date of Birth: 1960-04-04  Today's Date: 09/23/2020 PT Individual Time: 1420-1525 PT Individual Time Calculation (min): 65 min    Patient has met 11 of 11 long term goals due to improved activity tolerance, improved balance, improved postural control, increased strength, decreased pain, ability to compensate for deficits, improved attention, improved awareness, and improved coordination.  Patient to discharge at an ambulatory level Supervision.   Patient's care partner is independent to provide the necessary physical and cognitive assistance at discharge.  Reasons goals not met: n/a  Recommendation:  Patient will benefit from ongoing skilled PT services in outpatient setting to continue to advance safe functional mobility, address ongoing impairments in balance, strength, activity tolerance, gait and stair training, awareness, community integration, patient/caregiver education, and minimize fall risk.  Equipment: RW  Reasons for discharge: treatment goals met  Patient/family agrees with progress made and goals achieved: Yes  Skilled Therapeutic Intervention: Patient in recliner tapping on her phone screen upon PT arrival. Patient alert and agreeable to PT session. Patient denied pain during session.  Patient continues to present with flat affect, reduced awareness of cognitive deficits, deficits in self-regulation, and perseverative behaviors throughout session.   Therapeutic Activity: Transfers: Patient performed sit to/from stand independently from multiple surface heights with and without RW throughout session. Patient spontaneously grabbed between her legs and began to run to the bathroom in the Day room. Provided CGA for safety without AD and directed patient to nearest bathroom. Patient was incontinent and continent of bladder due to urgency/poor self regulation, performed peri-care and lower  body clothing management with set-up to mod I. Performed hand hygiene independently with supervision for balance in standing following with reduced perseverative behaviors in a public bathroom.   Gait Training:  Patient ambulated >150 feet using RW x2 with supervision for safety and >500 feet around the unit with CGA-close supervision without AD. Ambulated as described below. Provided verbal cues for safety awareness, safe proximity to RW, and increased gait speed and arm swing without RW for improved balance.   Neuromuscular Re-ed: Patient performed the [facility] Test, see details below: Patient demonstrates increased fall risk as noted by score of 53/56 on Berg Balance Scale.  (<36= high risk for falls, close to 100%; 37-45 significant >80%; 46-51 moderate >50%; 52-55 lower >25%)  Patient performed 1 set of the following exercises provided with HEP handout: Access Code: [email or web address] Walking with Head Rotation - 3 x daily - 7 x weekly - 1 sets - 5 reps Heel Walking - 3 x daily - 7 x weekly - 2 sets - 2 reps Braided Sidestepping - 3 x daily - 7 x weekly - 1 sets - 5 reps Walking with Head Nod - 3 x daily - 7 x weekly - 1 sets - 5 reps Squat with Chair Touch - 3 x daily - 7 x weekly - 2 sets - 10 reps  Patient Education Handouts -walking program 5 min 3x per day -BP management and values  Patient in recliner in her room at end of session with breaks locked, seat belt alarm set, Telesitter in her room, and all needs within reach.   PT Discharge Precautions/Restrictions Precautions Precautions: Fall Precaution Comments: L hemi- mild Restrictions Weight Bearing Restrictions: No Vision/Perception  Vision - Assessment Eye Alignment: Within Functional Limits Ocular Range of Motion: Within Functional Limits Alignment/Gaze Preference: Within Defined Limits Tracking/Visual Pursuits: Able to track stimulus in all quads without  difficulty Saccades: Additional eye shifts occurred during  testing Convergence: Impaired (comment) (>5 cm) Perception Perception: Within Functional Limits Praxis Praxis: Impaired Praxis Impairment Details: Perseveration  Cognition Overall Cognitive Status: Impaired/Different from baseline Arousal/Alertness: Awake/alert Attention: Sustained Focused Attention: Appears intact Sustained Attention: Impaired Sustained Attention Impairment: Verbal basic;Functional basic Memory: Impaired Memory Impairment: Storage deficit;Decreased recall of new information;Decreased short term memory Decreased Short Term Memory: Functional basic;Verbal basic Awareness: Impaired Awareness Impairment: Emergent impairment Problem Solving: Impaired Problem Solving Impairment: Verbal basic;Functional basic Behaviors: Perseveration;Impulsive Safety/Judgment: Impaired Sensation Sensation Light Touch: Appears Intact Proprioception: Appears Intact Coordination Gross Motor Movements are Fluid and Coordinated: Yes Fine Motor Movements are Fluid and Coordinated: Yes Coordination and Movement Description: occasional posterior bias wiht STS, mobility and balance mostly impaired by attention deficits Heel Shin Test: [facility] Motor  Motor Motor: Hemiplegia Motor - Skilled Clinical Observations: very mild L hemi  Mobility Bed Mobility Rolling Right: Independent Right Sidelying to Sit: Independent Supine to Sit: Independent Sit to Supine: Independent Transfers Transfers: Sit to Stand;Stand to [employer] Transfers Sit to Stand: Independent Stand to Sit: Independent Stand Pivot Transfers: Supervision/Verbal cueing Transfer (Assistive device): Rolling walker (with or without RW) Locomotion  Gait Ambulation: Yes Gait Assistance: Maximal Assistance - Patient 25-49% Gait Distance (Feet): 750 Feet Assistive device: Rolling walker Gait Assistance Details: Verbal cues for precautions/safety;Other (comment) Gait Assistance Details: cues for attention to task Gait Gait  Pattern: Step-through pattern;Decreased stride length;Trunk flexed;Decreased trunk rotation;Narrow base of support Gait velocity: decreased Stairs / Additional Locomotion Stairs: Yes Stairs Assistance: Contact Guard/Touching assist Stair Management Technique: One rail Right Number of Stairs: 22 Height of Stairs: 6 Ramp: Contact Guard/touching assist Curb: Contact Guard/Touching assist Wheelchair Mobility Wheelchair Mobility: No  Trunk/Postural Assessment  Cervical Assessment Cervical Assessment: Within Functional Limits Thoracic Assessment Thoracic Assessment: Within Functional Limits Lumbar Assessment Lumbar Assessment: Within Functional Limits Postural Control Postural Control: Deficits on evaluation (delayed)  Balance Balance Balance Assessed: Yes Standardized Balance Assessment Standardized Balance Assessment: Berg Balance Test Berg Balance Test Sit to Stand: Able to stand without using hands and stabilize independently Standing Unsupported: Able to stand safely 2 minutes Sitting with Back Unsupported but Feet Supported on Floor or Stool: Able to sit safely and securely 2 minutes Stand to Sit: Sits safely with minimal use of hands Transfers: Able to transfer safely, minor use of hands Standing Unsupported with Eyes Closed: Able to stand 10 seconds with supervision Standing Ubsupported with Feet Together: Able to place feet together independently and stand 1 minute safely From Standing, Reach Forward with Outstretched Arm: Can reach forward >12 cm safely (5") From Standing Position, Pick up Object from Floor: Able to pick up shoe safely and easily From Standing Position, Turn to Look Behind Over each Shoulder: Looks behind from both sides and weight shifts well Turn 360 Degrees: Able to turn 360 degrees safely in 4 seconds or less Standing Unsupported, Alternately Place Feet on Step/Stool: Able to stand independently and safely and complete 8 steps in 20 seconds Standing  Unsupported, One Foot in Front: Able to plae foot ahead of the other independently and hold 30 seconds Standing on One Leg: Able to lift leg independently and hold > 10 seconds Total Score: 53 Static Sitting Balance Static Sitting - Balance Support: Feet supported;No upper extremity supported Static Sitting - Level of Assistance: 7: Independent Dynamic Sitting Balance Dynamic Sitting - Balance Support: Feet supported;No upper extremity supported Dynamic Sitting - Level of Assistance: 7: Independent Static Standing Balance Static Standing - Balance  Support: During functional activity;No upper extremity supported Static Standing - Level of Assistance: 6: Modified independent (Device/Increase time) Dynamic Standing Balance Dynamic Standing - Level of Assistance: 5: Stand by assistance Extremity Assessment  RLE Assessment RLE Assessment: Within Functional Limits General Strength Comments: Grossly 5/5 throughout LLE Assessment LLE Assessment: Exceptions to [facility] General Strength Comments: Grossly in sitting: hip flexion 4+/5, knee flex/ext 5/5, DF/PF 5/5, hip abd 4/5, hip add 5/5    Ieesha Abbasi L Novah Nessel PT, DPT  09/23/2020, 2:48 PM

## 2020-09-23 NOTE — Progress Notes (Signed)
Speech Language Pathology Discharge Summary  Patient Details  Name: Betty Archer MRN: [id number] Date of Birth: 1960/09/15  Today's Date: 09/23/2020 SLP Individual Time: 0715-0800 SLP Individual Time Calculation (min): 45 min   Skilled Therapeutic Interventions:  Skilled treatment session focused on cognitive goals. Upon arrival, patient was awake in bed and agreeable to participate in treatment session. Patient ambulated to the bathroom with supervision and was continent of bladder. Patient requested a soapy rag and proceeded to wash her peri area thoroughly.  Patient also performed basic self-care tasks at the sink like washing her hands and brushing her teeth in a timely manner. SLP attempted ot engage patient in functional conversation that focused on anticipatory awareness. Patient with minimal conversational exchange but did report she knows she is unable to drive or return to work at this time. Patient recorded events in her memory notebook with overall Min A verbal cues for problem solving and organization. Patient left upright in recliner with alarm on and all needs within reach. Continue with current plan of care.   Patient has met 4 of 5 long term goals.  Patient to discharge at [facility] level.   Reasons goals not met: Patient requires Mod A multimodal cues for complex problem solving and Min verbal cues for basic problem solving.   Clinical Impression/Discharge Summary: Patient has made functional gains and has met 4 of 5 LTGs this admission. Patient's overall cognitive functioning fluctuates but patient requires overall Min A verbal cues to complete functional and familiar tasks safely in regards to attention, recall of daily information with use of aids and awareness. Increased cueig is needed for problem solving and attention in a moderately distracting environment.  Patient and family education is complete and patient will discharge home with 24 hour supervision from family.  Patient would benefit from f/u SLP services to maximize her cognitive functioning and overall functional independence on order to reduce caregiver burden.   Care Partner:  Caregiver Able to Provide Assistance: Yes     Recommendation:  24 hour supervision/assistance;Outpatient SLP  Rationale for SLP Follow Up: Maximize cognitive function and independence   Equipment: N/A   Reasons for discharge: Discharged from hospital   Patient/Family Agrees with Progress Made and Goals Achieved: Yes    Tatum Corl 09/23/2020, 6:20 AM

## 2020-09-24 ENCOUNTER — Other Ambulatory Visit (HOSPITAL_COMMUNITY): Payer: Self-pay

## 2020-09-24 NOTE — Progress Notes (Signed)
PROGRESS NOTE   Subjective/Complaints: Pt up in bed. Talking on phone with family. No new problems reported  ROS: Patient denies fever, rash, sore throat, blurred vision, nausea, vomiting, diarrhea, cough, shortness of breath or chest pain, joint or back pain, headache, or mood change.   Objective:   No results found. No results for input(s): WBC, HGB, HCT, PLT in the last 72 hours.   No results for input(s): NA, K, CL, CO2, GLUCOSE, BUN, CREATININE, CALCIUM in the last 72 hours.    Intake/Output Summary (Last 24 hours) at 09/24/2020 0915 Last data filed at 09/23/2020 2000 Gross per 24 hour  Intake 420 ml  Output --  Net 420 ml        Physical Exam: Vital Signs Blood pressure 120/71, pulse 95, temperature 98.7 F (37.1 C), temperature source Oral, resp. rate 18, height '5\' 6"'$  (1.676 m), weight 83 kg, SpO2 99 %. Constitutional: No distress . Vital signs reviewed. HEENT: NCAT, EOMI, oral membranes moist Neck: supple Cardiovascular: RRR without murmur. No JVD    Respiratory/Chest: CTA Bilaterally without wheezes or rales. Normal effort    GI/Abdomen: BS +, non-tender, non-distended Ext: no clubbing, cyanosis, or edema Psych: flat but cooperative Skin: No evidence of breakdown, no evidence of rash Musc:       General: No swelling or tenderness. Normal range of motion.    Cervical back: Neck supple. Neurological:    Comments: engages more. Sometimes difficult to engage. . Follows commands.     Tracks to left and right. Moves RUE and RLE 5/5. 4/5 LUE and   4+/5LLE. Senses pain on all 4. Normal tone.        Assessment/Plan: 1. Functional deficits which require 3+ hours per day of interdisciplinary therapy in a comprehensive inpatient rehab setting. Physiatrist is providing close team supervision and 24 hour management of active medical problems listed below. Physiatrist and rehab team continue to assess barriers to  discharge/monitor patient progress toward functional and medical goals  Care Tool:  Bathing    Body parts bathed by patient: Right arm, Left arm, Chest, Abdomen, Face, Right upper leg, Left upper leg, Front perineal area, Buttocks, Right lower leg, Left lower leg     Body parts n/a: Front perineal area, Buttocks, Right lower leg, Left lower leg   Bathing assist Assist Level: Supervision/Verbal cueing     Upper Body Dressing/Undressing Upper body dressing   What is the patient wearing?: Pull over shirt    Upper body assist Assist Level: Independent with assistive device    Lower Body Dressing/Undressing Lower body dressing      What is the patient wearing?: Pants, Incontinence brief     Lower body assist Assist for lower body dressing: Supervision/Verbal cueing     Toileting Toileting Toileting Activity did not occur (Clothing management and hygiene only): N/A (no void or bm)  Toileting assist Assist for toileting: Supervision/Verbal cueing     Transfers Chair/bed transfer  Transfers assist     Chair/bed transfer assist level: Supervision/Verbal cueing     Locomotion Ambulation   Ambulation assist      Assist level: Supervision/Verbal cueing Assistive device: Walker-rolling Max distance: >700 ft  Walk 10 feet activity   Assist  Walk 10 feet activity did not occur: Safety/medical concerns  Assist level: Supervision/Verbal cueing Assistive device: Walker-rolling   Walk 50 feet activity   Assist Walk 50 feet with 2 turns activity did not occur: Safety/medical concerns  Assist level: Supervision/Verbal cueing Assistive device: Walker-rolling    Walk 150 feet activity   Assist Walk 150 feet activity did not occur: Safety/medical concerns  Assist level: Supervision/Verbal cueing Assistive device: Walker-rolling    Walk 10 feet on uneven surface  activity   Assist Walk 10 feet on uneven surfaces activity did not occur: Safety/medical  concerns   Assist level: Supervision/Verbal cueing Assistive device: Aeronautical engineer Will patient use wheelchair at discharge?: No Type of Wheelchair: Manual Wheelchair activity did not occur: Safety/medical concerns         Wheelchair 50 feet with 2 turns activity    Assist    Wheelchair 50 feet with 2 turns activity did not occur: Safety/medical concerns       Wheelchair 150 feet activity     Assist  Wheelchair 150 feet activity did not occur: Safety/medical concerns       Blood pressure 120/71, pulse 95, temperature 98.7 F (37.1 C), temperature source Oral, resp. rate 18, height '5\' 6"'$  (1.676 m), weight 83 kg, SpO2 99 %.  Medical Problem List and Plan: 1.  Altered mental status with decreased functional mobility/left lower extremity weakness secondary to large right ACA and small left ACA infarct likely related to recent ACOM aneurysm post coiling and stenting             -dc home today -f/u with [facility]  2.  stroke prophylaxis             -antiplatelet therapy: Brilinta 90 mg twice daily, aspirin 81 mg daily 3. Pain Management: Lidoderm patch as directed, Tylenol as needed   - denies pain-   4. Mood: Team to provide emotional support as necessary             -antipsychotic agents: N/A 5. Neuropsych: This patient is not capable of making decisions on her own behalf. 6. Skin/Wound Care: Routine skin checks 7. Fluids/Electrolytes/Nutrition: Routine in and outs               -still eating well 8.  Hyperlipidemia.  Continue Lipitor 9.  Hypertension.  Monitor with increased mobility     Vitals:   09/23/20 1958 09/24/20 0319  BP: 100/66 120/71  Pulse: 83 95  Resp:  18  Temp:  98.7 F (37.1 C)  SpO2:  99%         controlled 8/5 10.  History of tobacco abuse.  Continue counseling  11. Poor initiation/concentration-     ritalin '15mg'$  bid---continue at discharge 12. Constipation -Continue Senokot 1 tab daily for daily regimen        LOS: 21 days A FACE TO FACE EVALUATION WAS PERFORMED  [name] 09/24/2020, 9:15 AM

## 2020-09-24 NOTE — Progress Notes (Signed)
Pt dc home with family via wheelchair-all belongings sent home with pt-DC instructions gone over with pt by [facility] RN-no distress noted at time of DC

## 2020-09-24 NOTE — Progress Notes (Signed)
Inpatient Rehabilitation Care Coordinator Discharge Note  The overall goal for the admission was met for:   Discharge location: D/c to home with support from  her sons and other family.   Length of Stay: 20 days.   Discharge activity level: Supervision.   Home/community participation: Yes. Limited.   Services provided included: MD, RD, PT, OT, SLP, RN, CM, TR, Pharmacy, Neuropsych, and SW  Financial Services: Other: Uninsured  Choices offered to/list presented to:Yes  Follow-up services arranged: Outpatient: [facility] for outpatient PT/OT/SLP and DME: BSC, RW and shower chair (family to get)  Comments (or additional information):  Patient/Family verbalized understanding of follow-up arrangements: Yes  Individual responsible for coordination of the follow-up plan: contact pt son [place] [phone]  Confirmed correct DME delivered: Rana Snare 09/24/2020    Rana Snare

## 2020-09-24 NOTE — Plan of Care (Signed)
  Problem: Consults Goal: RH STROKE PATIENT EDUCATION Description: See Patient Education module for education specifics  Outcome: Progressing   Problem: RH BOWEL ELIMINATION Goal: RH STG MANAGE BOWEL WITH ASSISTANCE Description: STG Manage Bowel with Supervision Assistance. Outcome: Progressing   Problem: RH SKIN INTEGRITY Goal: RH STG MAINTAIN SKIN INTEGRITY WITH ASSISTANCE Description: STG Maintain Skin Integrity With Supervision Assistance. Outcome: Progressing   Problem: RH SAFETY Goal: RH STG ADHERE TO SAFETY PRECAUTIONS W/ASSISTANCE/DEVICE Description: STG Adhere to Safety Precautions With supervision Assistance/Device. Outcome: Progressing Goal: RH STG DECREASED RISK OF FALL WITH ASSISTANCE Description: STG Decreased Risk of Fall With supervision Assistance. Outcome: Progressing   Problem: RH PAIN MANAGEMENT Goal: RH STG PAIN MANAGED AT OR BELOW PT'S PAIN GOAL Description: < 3 on a 0-10 pain scale. Outcome: Progressing   Problem: RH KNOWLEDGE DEFICIT Goal: RH STG INCREASE KNOWLEDGE OF STROKE PROPHYLAXIS Description: Patient will be able to demonstrate knowledge of medications used to prevent future strokes with educational materials and handouts provided by staff independently at discharge. Outcome: Progressing

## 2020-10-01 ENCOUNTER — Telehealth: Payer: Self-pay | Admitting: Student

## 2020-10-01 ENCOUNTER — Ambulatory Visit
Payer: Self-pay | Attending: Physical Medicine and Rehabilitation | Admitting: Rehabilitative and Restorative Service Providers"

## 2020-10-01 ENCOUNTER — Ambulatory Visit (HOSPITAL_COMMUNITY)
Admission: RE | Admit: 2020-10-01 | Discharge: 2020-10-01 | Disposition: A | Discharge status: Home / Self Care | Payer: Self-pay | Source: Ambulatory Visit | Attending: Student | Admitting: Student

## 2020-10-01 ENCOUNTER — Other Ambulatory Visit: Payer: Self-pay

## 2020-10-01 ENCOUNTER — Other Ambulatory Visit: Payer: Self-pay | Admitting: Student

## 2020-10-01 DIAGNOSIS — I639 Cerebral infarction, unspecified: Secondary | ICD-10-CM | POA: Insufficient documentation

## 2020-10-01 DIAGNOSIS — R600 Localized edema: Secondary | ICD-10-CM

## 2020-10-01 DIAGNOSIS — M6281 Muscle weakness (generalized): Secondary | ICD-10-CM | POA: Insufficient documentation

## 2020-10-01 DIAGNOSIS — R278 Other lack of coordination: Secondary | ICD-10-CM | POA: Insufficient documentation

## 2020-10-01 DIAGNOSIS — R2689 Other abnormalities of gait and mobility: Secondary | ICD-10-CM | POA: Insufficient documentation

## 2020-10-01 DIAGNOSIS — I69319 Unspecified symptoms and signs involving cognitive functions following cerebral infarction: Secondary | ICD-10-CM | POA: Insufficient documentation

## 2020-10-01 DIAGNOSIS — R41840 Attention and concentration deficit: Secondary | ICD-10-CM | POA: Insufficient documentation

## 2020-10-01 DIAGNOSIS — I69354 Hemiplegia and hemiparesis following cerebral infarction affecting left non-dominant side: Secondary | ICD-10-CM | POA: Insufficient documentation

## 2020-10-01 DIAGNOSIS — R2681 Unsteadiness on feet: Secondary | ICD-10-CM | POA: Insufficient documentation

## 2020-10-01 DIAGNOSIS — R41841 Cognitive communication deficit: Secondary | ICD-10-CM | POA: Insufficient documentation

## 2020-10-01 DIAGNOSIS — R41842 Visuospatial deficit: Secondary | ICD-10-CM | POA: Insufficient documentation

## 2020-10-01 DIAGNOSIS — M25672 Stiffness of left ankle, not elsewhere classified: Secondary | ICD-10-CM | POA: Insufficient documentation

## 2020-10-01 NOTE — Telephone Encounter (Addendum)
[name] is 60 year old female s/p stent placement with Dr. Karenann Cai on 08/26/2020.  She also presented to [facility] ED on 08/31/2020 and was found to have a in-stent thrombosis most likely due to COVID-19 infection.  Patient was discharged in stable condition on 09/23/2020. Patient was brought to [facility] IR today by her son due to concerning of leg swelling.  Patient was evaluated by Dr. Karenann Cai who recommended vascular ultrasound of bilateral lower extremities to rule out DVT. Order placed, cardiovascular ultrasound department notified and patient is scheduled for 1 PM today.  Called Mr. Carloyn Manner and informed the 1:00 pm appointment with cardiovascular ultrasound. Mr. [name] Sorrow verified the appointment time.  Will follow-up the Doppler results.  Armando Gang Evelette Hollern PA-C 10/01/2020 10:58 AM

## 2020-10-01 NOTE — Telephone Encounter (Signed)
This encounter was opened by accident.   Armando Gang Anarely Nicholls PA-C 10/01/2020 11:05 AM

## 2020-10-01 NOTE — Progress Notes (Signed)
BLE venous has been completed.  Preliminary results given to [employer], PA.  Results can be found under chart review under CV PROC. 10/01/2020 3:14 PM Tashanti Dalporto RVT, RDMS

## 2020-10-04 ENCOUNTER — Other Ambulatory Visit (HOSPITAL_COMMUNITY): Payer: Self-pay

## 2020-10-04 ENCOUNTER — Telehealth (HOSPITAL_COMMUNITY): Payer: Self-pay

## 2020-10-04 NOTE — Telephone Encounter (Signed)
Patient stated she is improving and had no questions. Patient is aware of her follow up appts and is taking her medications. She is not experiencing any side effects.

## 2020-10-06 ENCOUNTER — Ambulatory Visit: Payer: Self-pay | Admitting: Speech Pathology

## 2020-10-06 ENCOUNTER — Ambulatory Visit: Payer: Self-pay | Admitting: Occupational Therapy

## 2020-10-06 ENCOUNTER — Encounter: Payer: Self-pay | Admitting: Occupational Therapy

## 2020-10-06 ENCOUNTER — Encounter: Payer: Self-pay | Admitting: Speech Pathology

## 2020-10-06 ENCOUNTER — Other Ambulatory Visit: Payer: Self-pay

## 2020-10-06 DIAGNOSIS — I69319 Unspecified symptoms and signs involving cognitive functions following cerebral infarction: Secondary | ICD-10-CM

## 2020-10-06 DIAGNOSIS — R2681 Unsteadiness on feet: Secondary | ICD-10-CM

## 2020-10-06 DIAGNOSIS — R41840 Attention and concentration deficit: Secondary | ICD-10-CM

## 2020-10-06 DIAGNOSIS — M6281 Muscle weakness (generalized): Secondary | ICD-10-CM

## 2020-10-06 DIAGNOSIS — R41842 Visuospatial deficit: Secondary | ICD-10-CM

## 2020-10-06 DIAGNOSIS — R41841 Cognitive communication deficit: Secondary | ICD-10-CM

## 2020-10-06 DIAGNOSIS — R278 Other lack of coordination: Secondary | ICD-10-CM

## 2020-10-07 NOTE — Therapy (Signed)
[place] [place], [place], [address] Phone: [phone]   Fax:  [phone]  Speech Language Pathology Evaluation  Patient Details  Name: Betty Archer MRN: [id number] Date of Birth: [birth year] Referring Provider (SLP): Reesa Chew [place]   Encounter Date: 10/06/2020   End of Session - 10/07/20 0835     Visit Number 1    Number of Visits 17    Date for SLP Re-Evaluation 12/07/20    SLP Start Time 1610    SLP Stop Time  1650    SLP Time Calculation (min) 40 min    Activity Tolerance Patient tolerated treatment well             Past Medical History:  Diagnosis Date   Hyperlipidemia     Past Surgical History:  Procedure Laterality Date   BREAST BIOPSY      Core biopsy done on April 2003   COLONOSCOPY     IR 3D INDEPENDENT [facility]  08/25/2020   IR 3D INDEPENDENT [facility]  08/25/2020   IR ANGIO INTRA EXTRACRAN SEL INTERNAL CAROTID BILAT MOD SED  08/25/2020   IR ANGIO VERTEBRAL SEL VERTEBRAL BILAT MOD SED  08/25/2020   IR CT HEAD LTD  08/25/2020   IR INTRA CRAN STENT  08/25/2020   IR TRANSCATH/EMBOLIZ  08/25/2020   IR US GUIDE VASC ACCESS RIGHT  08/25/2020   PARTIAL HYSTERECTOMY   10/22/1999    Still has cervix   RADIOLOGY WITH ANESTHESIA N/A 08/25/2020   Procedure: IR WITH ANESTHESIA EMBOLIZATION;  Surgeon: Pedro Earls, MD;  Location: [facility];  Service: Radiology;  Laterality: N/A;   TRIGGER FINGER RELEASE Left 12/25/2013   Procedure: LEFT THUMB TRIGGER RELEASE ;  Surgeon: Leanora Cover, MD;  Location: [facility];  Service: Orthopedics;  Laterality: Left;    There were no vitals filed for this visit.       SLP Evaluation [facility] - 10/06/20 1619       SLP Visit Information   SLP Received On 10/06/20    Referring Provider (SLP) Reesa Chew PA    Onset Date 08/31/20    Medical Diagnosis CVA      Subjective   Patient/Family Stated Goal To become more independent      Pain Assessment    Currently in Pain? No/denies      General Information   HPI Betty Archer is a 60 y.o. right-handed female with history of hypertension hyperlipidemia tobacco use as well as a 6 mm right A2/ACA aneurysm and an 8 mm left MCA bifurcation aneurysm identified after patient with complaints of headache and right arm numbness in June 2022 followed by neurology services underwent diagnostic cerebral angiogram and right aneurysmal embolization 08/25/2020 per interventional radiology and left MCA aneurysm unrepaired with course complicated by right groin hematoma that required prolonged initial intubation was discharged home 08/27/2020 maintained on aspirin as well as Brilinta.  Per chart review lives with her son.  Presented 08/31/2020 with altered mental status and lower extremity weakness.  Cranial CT scan negative for acute changes.  CT angiogram of head and neck no emergent large vessel occlusion or high-grade stenosis.  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  Coil mass at the anterior communicating artery without visible residual aneurysmal filling.  MRI of the brain showed confluent right acute ACA territory infarction including involvement of the anterior basal ganglia and ischemia also in the left ACA territory with no associated hemorrhage  or mass-effect      Balance Screen   Has the patient fallen in the past 6 months No      Prior Functional Status   Cognitive/Linguistic Baseline Within functional limits    Type of Home Apartment     Lives With Son;Other (Comment)   [place] Other (Comment)   Pt is not working currently, but was prior to CVA     Cognition   Overall Cognitive Status Impaired/Different from baseline    Area of Impairment Orientation;Attention;Memory;Safety/judgement;Awareness    Orientation Level Time;Situation    General Comments "1922" vs "2022"; unable to provide details on why the doctor referred her for therapy    Current  Attention Level Focused    Attention Comments Impaired sustained, alternating, and divided attention    Memory Decreased recall of precautions    Safety/Judgement Decreased awareness of deficits;Decreased awareness of safety    Awareness Intellectual    Attention Sustained;Selective;Alternating;Divided    Memory Impaired    Awareness Impaired    Awareness Impairment Intellectual impairment    Problem Solving Impaired    Problem Solving Impairment Functional basic    Executive Function Reasoning;Sequencing;Organizing;Decision Making;Initiating;Self Monitoring;Self Correcting      Auditory Comprehension   Overall Auditory Comprehension Appears within functional limits for tasks assessed      Verbal Expression   Overall Verbal Expression Appears within functional limits for tasks assessed      Written Expression   Dominant Hand Right      Standardized Assessments   Standardized Assessments  Other Assessment   SLUMS, ALFA subtests            SLU Mental Status (SLUMS Examination)  Orientation: 2/3 Delayed Recall w/ Interference: 4/5 Numeric Calculation and Registration: 1/3 Immediate Recall w/ Interference (Generative naming): 1/3 Registration and Digit Span: 2/2 Visual Spatial/Exec Functioning: 2/6 Executive Functioning/Extrapolation:  4/8  Total:16/30                 SLP Education - 10/07/20 1324     Education Details Provided edu to son and patient about deficits and future targets for goals; cog-communication deficit    Person(s) Educated Patient;Child(ren)    Methods Explanation;Demonstration    Comprehension Verbalized understanding;Need further instruction              SLP Short Term Goals - 10/07/20 1348       SLP SHORT TERM GOAL #1   Title Pt will recall orientation (time and situation) post 10 minutes using spaced retrieval therapy.    Time 4    Period Weeks    Status New    Target Date 11/07/20      SLP SHORT TERM GOAL #2   Title  Demonstrate sustained attention by maintaining focus during a task for 10 minutes with <1 verbal cue in a quiet environment.    Time 4    Period Weeks    Status New    Target Date 11/07/20      SLP SHORT TERM GOAL #3   Title Pt will read 3 prescription labels and organize corresponding pills into pill box with supervision from SLP.    Time 4    Period Weeks    Status New    Target Date 11/07/20              SLP Long Term Goals - 10/07/20 1345       SLP LONG TERM GOAL #1  Title Patient will be appropriately oriented to person, place, time, and situation without cues to improve safety and awareness in functional living environment.    Time 8    Period Weeks    Status New    Target Date 12/07/20      SLP LONG TERM GOAL #2   Title Patient will develop sustained attention skills to effectively attend to and communicate in simple daily living tasks in functional living environment.    Time 8    Period Weeks    Status New    Target Date 12/07/20      SLP LONG TERM GOAL #3   Title Pt will organize medication into pill box with minA from pt family.    Time 8    Period Weeks    Status New    Target Date 12/07/20              Plan - 10/07/20 0837     Clinical Impression Statement Pt was seen in OP for evaluation this date post CVA on 08/31/20. Pt underwent R aneurysmal embolization on 08/25/20. Pt participated in inpatient rehab @ [facility] from 09/03/20 to 09/24/20. SLP spoke with pt and son separetely. Pt demonstrates anosognosia; however, son is well aware of pt's limitations due to cognition. Flat affect noted throughout session - pt did not make any positive or negative reactions to SLP assessment/conversation. SLP screened cognitive skills using SLUMS and evaluated pt using subtests of ALFA. Pt scored a 16/30 on SLUMS with deficits noted in orientation (time/situation), attention, problem solving, and executive functioning skills. Performance on ALFA subtests were as follows  re: telling time 7/10; medicine labels 5/6; calendar 5/5; reading schedules; 2/4. Pt son reported that he is currently responsible for all iADLs and that she requires assistance with some ADLs (bathing, toileting). SLP suspects attention deficit has most impact on her success with therapeutic tasks. SLP rec ST services to address cognitive-communication deficit to assist pt with returning to independence in all ADLs.    Speech Therapy Frequency 2x / week    Duration 8 weeks    Treatment/Interventions Functional tasks;Patient/family education;Cueing hierarchy;Environmental controls;Cognitive reorganization;Compensatory techniques;Internal/external aids;SLP instruction and feedback;Multimodal communcation approach    Potential to Achieve Goals Good    Consulted and Agree with Plan of Care Patient;Family member/caregiver    Family Member [facility] - son             Patient will benefit from skilled therapeutic intervention in order to improve the following deficits and impairments:   Cognitive communication deficit    Problem List Patient Active Problem List   Diagnosis Date Noted   Acute ischemic cerebrovascular accident (CVA) involving anterior cerebral artery territory ([place]) 09/03/2020   Acute CVA (cerebrovascular accident) ([place]) 09/01/2020   Acute ischemic stroke ([place]) 08/31/2020   Aneurysm of anterior communicating artery 08/25/2020   Aneurysm, cerebral, nonruptured 08/25/2020   Microcytic anemia 10/26/2014   Vitamin D deficiency 10/17/2014   Stenosing tenosynovitis of thumb 10/17/2014   Internal hemorrhoids 10/17/2014   History of colon polyps 10/17/2014   Osteoarthritis of carpometacarpal joints of both thumbs 04/04/2014   Pre-diabetes 08/18/2013   History of TIA (transient ischemic attack) 08/12/2013   Routine health maintenance 08/15/2010   Hyperlipidemia 08/05/2008   Obesity 07/30/2007   TOBACCO DEPENDENCE 04/19/2006    Rosann Auerbach [place] MS, Betty Archer [place],  CBIS  10/07/2020, 1:57 PM  [address] [place], [place], [address] Phone: [phone]  Fax:  [phone]  Name: Betty Archer MRN: [id number] Date of Birth: Apr 23, 1960

## 2020-10-07 NOTE — Therapy (Signed)
[facility]. [place], [place], [address] Phone: [phone]   Fax:  [phone]  Occupational Therapy Evaluation  Patient Details  Name: Betty Archer MRN: [id number] Date of Birth: 1961/01/16 No data recorded  Encounter Date: 10/06/2020   OT End of Session - 10/06/20 1656     Visit Number 1    Date for OT Re-Evaluation 01/04/21    Authorization Type Self-pay    OT Start Time 1650    OT Stop Time 1730    OT Time Calculation (min) 40 min    Activity Tolerance Patient tolerated treatment well    Behavior During Therapy [facility] for tasks assessed/performed            Past Medical History:  Diagnosis Date   Hyperlipidemia     Past Surgical History:  Procedure Laterality Date   BREAST BIOPSY      Core biopsy done on April 2003   COLONOSCOPY     IR 3D INDEPENDENT WKST  08/25/2020   IR 3D INDEPENDENT WKST  08/25/2020   IR ANGIO INTRA EXTRACRAN SEL INTERNAL CAROTID BILAT MOD SED  08/25/2020   IR ANGIO VERTEBRAL SEL VERTEBRAL BILAT MOD SED  08/25/2020   IR CT HEAD LTD  08/25/2020   IR INTRA CRAN STENT  08/25/2020   IR TRANSCATH/EMBOLIZ  08/25/2020   IR US GUIDE VASC ACCESS RIGHT  08/25/2020   PARTIAL HYSTERECTOMY   10/22/1999    Still has cervix   RADIOLOGY WITH ANESTHESIA N/A 08/25/2020   Procedure: IR WITH ANESTHESIA EMBOLIZATION;  Surgeon: Pedro Earls, MD;  Location: [place];  Service: Radiology;  Laterality: N/A;   TRIGGER FINGER RELEASE Left 12/25/2013   Procedure: LEFT THUMB TRIGGER RELEASE ;  Surgeon: Leanora Cover, MD;  Location: [place];  Service: Orthopedics;  Laterality: Left;    There were no vitals filed for this visit.   Subjective Assessment - 10/06/20 1651     Subjective  Pt was unable verbalize reason for OT evaluation, stating "I had an appointment" when asked why she was present for ST/OT sessions. Pt did recall/state that she recently had an aneurysm. Per pt's son, she is  having difficulty initiating tasks and demo'ing decreased participation/independence w/ IADLs (cooking, cleaning). Pt was unable to verbalize any functional concerns independently at this time.   Patient is accompanied by: Family member   son (was not in treatment room during evaluation session)   Pertinent History Recent hx: 6 mm R A2/ACA aneurysm and 8 mm L MCA bifurcation aneurysm; PMH includes HTN, HLD, and tobacco use    Limitations Cognitive deficits (orientation, awareness, attention, safety/judgement, self-monitoring, problem-solving)   Patient Stated Goals Difficult to obtain due to cognitive deficits   Currently in Pain? No/denies          [facility] OT Assessment - 10/06/20 1657       Assessment   Medical Diagnosis R ACA territory infarction s/p R ACA aneurysm repair   L MCA aneurysm remains unrepaired at this time   Referring Provider (OT) Reesa Chew, PA-C   Onset Date/Surgical Date 08/31/20   Aneurysms identified 08/07/20   Hand Dominance Right    Next MD Visit 10/13/20    Prior Therapy IP PT/OT/ST      Precautions   Precautions Fall   Balance Screen    Has the patient fallen in the past 6 months No      Home  Environment   Available  Help at Discharge Family    Type of [place] One level    Bathroom Shower/Tub Walk-in Pharmacologist - 4 wheels;Hand held shower head;Grab bars - tub/shower    Lives With Son   2 sons ([place])     Prior Function   Level of [place] Unemployed    Vocation Requirements Full time employment; "packing"    Leisure Listen to music      ADL   Eating/Feeding Supervision/safety    Grooming Modified independent    Upper Body Bathing Supervision/safety    Lower Body Bathing Supervision/safety    Upper Body Dressing Supervision/safety    Lower Body Dressing Supervision/safety    Toilet Transfer Supervision/safety    Toileting - Clothing  Manipulation Modified independent    [place]    Tub/Shower Transfer Supervision/safety   IADL    Light Housekeeping Does not participate in any housekeeping tasks   Meal Prep Needs to have meals prepared and served   [employer] on family or friends for transportation;     Statistician Range of Motion Within Functional Limits    Tracking/Visual Pursuits Impaired - to be further tested in functional context   Decreased smoothness/difficulty holding position out of midline w/ looking to R side   Saccades Decreased speed of saccadic movement    Convergence Impaired - to be further tested in functional context   Decreased convergence w/ L eye; pt reported dizziness   Visual Fields Right visual field deficit    Demo'd mild decreased awareness to R side and R peripheral visual field deficit by gross assessment   Comment Number cancellation (61M size) completed in 1 min, 4 sec   Demo'd difficulty terminating task     Cognition   Overall Cognitive Status Impaired/Different from baseline   Area of Impairment Orientation;Attention;Memory;Safety/judgement;Awareness;Problem solving   Orientation Level Disoriented to;Situation   Cognition Comments Trail Making Test: Part A in 1 min, 11 sec; Part B in 3 min, 9 sec   Demo'd 1-2 errors in both parts w/ appropriate self-correction (no cues)     Sensation   Additional Comments No sensory deficits, per pt report      Coordination   Gross Motor Movements are Fluid and Coordinated Yes    Fine Motor Movements are Fluid and Coordinated Yes    Finger Nose Finger Test [facility]    9 Hole Peg Test Right;Left    Right 9 Hole Peg Test 29 sec    Left 9 Hole Peg Test 37 sec    Box and Blocks RUE (36 blocks); LUE (36 blocks)      Edema   Edema BLE edema present; is currently being addressed      ROM / Strength   AROM / PROM / Strength AROM;Strength      AROM   Overall AROM  Within functional limits for  tasks performed    Overall AROM Comments BUEs (shoulder, elbow, wrist, hand)      Strength   Overall Strength Unable to assess;Due to precautions    Overall Strength Comments To be further assessed in functional context      Hand Function   Comment Unable to test strength due to medical precautions             OT Education - 10/06/20 1655  Education Details Education provided on role and purpose of OT, as well as potential interventions and goals for therapy.    Person(s) Educated Patient    Methods Explanation    Comprehension Verbalized understanding             OT Short Term Goals - 10/06/20 1747       OT SHORT TERM GOAL #1   Title Pt will demonstrate understanding of visual compensatory strategies to improve safety and participation during ADLs    Time 3    Period Weeks    Status New    Target Date 10/29/20      OT SHORT TERM GOAL #2   Title Pt will appropriately terminate task independently during therapeutic activity (e.g., tabletop activity, IADL task, etc)    Time 3    Period Weeks    Status New             OT Long Term Goals - 10/06/20 1748       OT LONG TERM GOAL #1   Title Pt will demonstrate improved processing speed for improved safety during cooking tasks as evidenced by decreasing Trail Making Test: Part A time by at least 10 sec    Baseline TMT: Part A in 1 min, 11 sec (1 error)    Time 6    Period Weeks    Status New    Target Date 11/19/20      OT LONG TERM GOAL #2   Title Pt will demonstrate independence w/ HEP designed for UB control/coordination and FM skills    Baseline 9HPT in 37 sec w/ LUE; 29 sec w/ RUE    Time 6    Period Weeks    Status New      OT LONG TERM GOAL #3   Title Pt will safely complete simple, multi-step meal prep activity w/ less than 2 verbal cues by d/c    Time 6    Period Weeks    Status New      OT LONG TERM GOAL #4   Title Pt will demonstrate appropriate initiation/termination of light  housekeeping tasks (e.g., unloading dishwasher, making the bed, cleaning countertop) w/ Mod I in at least 2 trials    Time 6    Period Weeks    Status New             Plan - 10/06/20 1733     Clinical Impression Statement Pt is a 59 y/o female who presents to OP OT due to R acute ACA territory infarction including involvement of anterior basal ganglia and L ACA territory that occurred 08/31/20. Pt currently lives with her son in an apartment and PMHx includes HTN, HLD, and tobacco use. On 08/25/20, pt underwent R ACA aneurysmal embolization w/ L MCA aneurysm unrepaired and reported to the hospital on 7/12 due to new/worsening symptoms. Pt will benefit from skilled occupational therapy services to address strength and coordination, balance, FM control, executive functioning and safety awareness, introduction of compensatory strategies/AE prn, visual-perception, and implementation of an HEP to improve participation and safety during ADLs and IADLs.    OT Occupational Profile and History Detailed Assessment- Review of Records and additional review of physical, cognitive, psychosocial history related to current functional performance    Occupational performance deficits (Please refer to evaluation for details): ADL's;IADL's;Rest and Sleep;Leisure;Social Participation    Body Structure / Function / Physical Skills ADL; Decreased knowledge of precautions; [place]; UE functional use; Strength; Vision; Sensation; IADL; Balance; Endurance;  [place]; Body mechanics; Coordination; Mobility    Cognitive Skills Attention; Memory; Safety Awareness; Problem Solve; Orientation; Perception    Rehab Potential Good    Clinical Decision Making Several treatment options, min-mod task modification necessary    Comorbidities Affecting Occupational Performance: May have comorbidities impacting occupational performance    Modification or Assistance to Complete Evaluation  Min-Moderate modification of tasks or assist with assess  necessary to complete eval    OT Frequency 1x / week    OT Duration 6 weeks    OT Treatment/Interventions Self-care/ADL training; Therapeutic exercise; Energy conservation; Therapist, nutritional; Therapeutic activities; Visual/perceptual remediation/compensation; Balance training; Psychosocial skills training; Patient/family education; Cognitive remediation/compensation; Manual Therapy; DME and/or AE instruction; Neuromuscular education   Plan Review goals w/ pt; introduce visual compensatory strategies/address visual perception   Recommended Other Services Pt is currently receiving SLP services at this clinic    Consulted and Agree with Plan of Care Patient             Patient will benefit from skilled therapeutic intervention in order to improve the following deficits and impairments:   Body Structure / Function / Physical Skills: ADL, Decreased knowledge of precautions, G[place], UE functional use, Strength, Vision, Sensation, IADL, Balance, Endurance, FMC, Body mechanics, Coordination, Mobility Cognitive Skills: Attention, Memory, Safety Awareness, Problem Solve, Orientation, Perception   Visit Diagnosis: Unspecified symptoms and signs involving cognitive functions following cerebral infarction  Visuospatial deficit  Attention and concentration deficit  Other lack of coordination  Muscle weakness (generalized)  Unsteadiness on feet    Problem List Patient Active Problem List   Diagnosis Date Noted   Acute ischemic cerebrovascular accident (CVA) involving anterior cerebral artery territory ([place]) 09/03/2020   Acute CVA (cerebrovascular accident) ([place]) 09/01/2020   Acute ischemic stroke ([place]) 08/31/2020   Aneurysm of anterior communicating artery 08/25/2020   Aneurysm, cerebral, nonruptured 08/25/2020   Microcytic anemia 10/26/2014   Vitamin D deficiency 10/17/2014   Stenosing tenosynovitis of thumb 10/17/2014   Internal hemorrhoids 10/17/2014   History of colon  polyps 10/17/2014   Osteoarthritis of carpometacarpal joints of both thumbs 04/04/2014   Pre-diabetes 08/18/2013   History of TIA (transient ischemic attack) 08/12/2013   Routine health maintenance 08/15/2010   Hyperlipidemia 08/05/2008   Obesity 07/30/2007   TOBACCO DEPENDENCE 04/19/2006     Kathrine Cords, OTR/L, MSOT  10/07/2020, 5:49 PM  [facility]. [place], [place], [address] Phone: [phone]   Fax:  [phone]  Name: Betty Archer MRN: [id number] Date of Birth: [birth year]

## 2020-10-08 ENCOUNTER — Other Ambulatory Visit: Payer: Self-pay

## 2020-10-08 ENCOUNTER — Ambulatory Visit: Payer: Self-pay | Admitting: Speech Pathology

## 2020-10-08 ENCOUNTER — Encounter: Payer: Self-pay | Admitting: Speech Pathology

## 2020-10-08 ENCOUNTER — Ambulatory Visit: Payer: Self-pay | Admitting: Physical Therapy

## 2020-10-08 ENCOUNTER — Encounter: Payer: Self-pay | Admitting: Physical Therapy

## 2020-10-08 DIAGNOSIS — R2681 Unsteadiness on feet: Secondary | ICD-10-CM

## 2020-10-08 DIAGNOSIS — M25672 Stiffness of left ankle, not elsewhere classified: Secondary | ICD-10-CM

## 2020-10-08 DIAGNOSIS — R41841 Cognitive communication deficit: Secondary | ICD-10-CM

## 2020-10-08 DIAGNOSIS — M6281 Muscle weakness (generalized): Secondary | ICD-10-CM

## 2020-10-08 DIAGNOSIS — R2689 Other abnormalities of gait and mobility: Secondary | ICD-10-CM

## 2020-10-08 DIAGNOSIS — I69354 Hemiplegia and hemiparesis following cerebral infarction affecting left non-dominant side: Secondary | ICD-10-CM

## 2020-10-08 NOTE — Therapy (Signed)
[facility]. [place], [place], [address] Phone: [phone]   Fax:  [phone]  Physical Therapy Evaluation  Patient Details  Name: Betty Archer MRN: [id number] Date of Birth: September 10, 1960 Referring Provider (PT): Bary Leriche, [place]   Encounter Date: 10/08/2020   PT End of Session - 10/08/20 1029     Visit Number 1    Number of Visits 17    Date for PT Re-Evaluation 12/03/20    Authorization Type Self Pay    PT Start Time [id number]    PT Stop Time 1015    PT Time Calculation (min) 41 min    Equipment Utilized During Treatment Gait belt    Activity Tolerance Patient tolerated treatment well    Behavior During Therapy [facility] for tasks assessed/performed             Past Medical History:  Diagnosis Date   Hyperlipidemia     Past Surgical History:  Procedure Laterality Date   BREAST BIOPSY      Core biopsy done on April 2003   COLONOSCOPY     IR 3D INDEPENDENT WKST  08/25/2020   IR 3D INDEPENDENT WKST  08/25/2020   IR ANGIO INTRA EXTRACRAN SEL INTERNAL CAROTID BILAT MOD SED  08/25/2020   IR ANGIO VERTEBRAL SEL VERTEBRAL BILAT MOD SED  08/25/2020   IR CT HEAD LTD  08/25/2020   IR INTRA CRAN STENT  08/25/2020   IR TRANSCATH/EMBOLIZ  08/25/2020   IR US GUIDE VASC ACCESS RIGHT  08/25/2020   PARTIAL HYSTERECTOMY   10/22/1999    Still has cervix   RADIOLOGY WITH ANESTHESIA N/A 08/25/2020   Procedure: IR WITH ANESTHESIA EMBOLIZATION;  Surgeon: Pedro Earls, MD;  Location: [place];  Service: Radiology;  Laterality: N/A;   TRIGGER FINGER RELEASE Left 12/25/2013   Procedure: LEFT THUMB TRIGGER RELEASE ;  Surgeon: Leanora Cover, MD;  Location: [place];  Service: Orthopedics;  Laterality: Left;    There were no vitals filed for this visit.    Subjective Assessment - 10/08/20 0939     Subjective Son present to assist with part of subjective support. Patient notes that since her stroke she feels tired  and the L leg feels heavy. Was walking with a walker when she got home from the hospital. Not using it today. Feels off balance and dizzy sometimes- unable to specify when or how often. Noting L dorsal foot pain which is new. Swelling present on B sides. Unable to specify aggravating/easing factors for her pain. Notes L UE and LE N/T.    Patient is accompained by: Family member    Pertinent History HLD, repair of right ACA aneurysm, left MCA aneurysm (unrepaired)    Limitations Sitting;Lifting;Standing;Walking;Writing;House hold activities    Diagnostic tests 09/01/20 brain MRI: Acute Right ACA territory infarct, Ischemia also in the Left ACA territory    Patient Stated Goals patient unable to verbalize goals for therapy but agreeable to work on strengthening    Currently in Pain? Yes    Pain Score 0-No pain    Pain Location Foot    Pain Orientation Left    Pain Descriptors / Indicators Throbbing    Pain Type Acute pain                [facility] PT Assessment - 10/08/20 0945       Assessment   Medical Diagnosis Acute ischemic stroke    Referring Provider (PT)  Bary Leriche, PA-C    Onset Date/Surgical Date 08/31/20    Hand Dominance Right    Next MD Visit 10/13/20    Prior Therapy IP PT/OT/ST      Precautions   Precautions Fall      Balance Screen   Has the patient fallen in the past 6 months No    Has the patient had a decrease in activity level because of a fear of falling?  No    Is the patient reluctant to leave their home because of a fear of falling?  No      Home Environment   Living Environment Private residence    Living Arrangements Children    Available Help at Discharge Family   sons able to supervise 24/7   Type of [place] to enter    Entrance Stairs-Number of Steps 17    Entrance Stairs-Rails Right;Left;Can reach both    [place] One level    [place] - standard;Shower seat;Grab bars - tub/shower      Prior  Function   Level of Independence Independent    Vocation Unemployed    Vocation Requirements Full time employment; "packing"    Leisure driving      Cognition   Overall Cognitive Status Impaired/Different from baseline      Sensation   Light Touch Appears Intact   notes diminished sensation on L UE and LE     Coordination   Gross Motor Movements are Fluid and Coordinated Yes      Posture/Postural Control   Posture/Postural Control Postural limitations    Postural Limitations Rounded Shoulders   head flexed     AROM   Overall AROM Comments R DF AROM -8 deg, L DF AROM -25 deg      Strength   Overall Strength Comments --   R hip: Flex 4+, ABD 4+, ADD 4. L hip: Flex 3+, ABD 4+, ADD 4-. R knee: ex 4+, flex 4+. L knee: ex 4-, flex 4-. R ankle: DF4+, PF 4+. L ankle: DF4, PF 4+     Palpation   Palpation comment increased tone in the L posterior calf; edematous over B dorsal foot and lateral aspec of ankle; TTP over L dorsal foot and lateral ankle; no redness, warmth, discoloration in LEs      Ambulation/Gait   Gait Pattern Step-through pattern;Decreased arm swing - right;Decreased arm swing - left;Poor foot clearance - left;Poor foot clearance - right;Trunk flexed    Ambulation Surface Level;Indoor    Gait velocity slightly decreased      Balance   Balance Assessed Yes      Standardized Balance Assessment   Standardized Balance Assessment Berg Balance Test;Timed Up and Go Test      Berg Balance Test   Sit to Stand Able to stand  independently using hands    Standing Unsupported Able to stand safely 2 minutes    Sitting with Back Unsupported but Feet Supported on Floor or Stool Able to sit safely and securely 2 minutes    Stand to Sit Controls descent by using hands    Transfers Able to transfer safely, minor use of hands    Standing Unsupported with Eyes Closed Able to stand 10 seconds safely    Standing Unsupported with Feet Together Able to place feet together independently  and stand 1 minute safely    From Standing, Reach Forward with Outstretched Arm Can reach forward >  12 cm safely (5")    From Standing Position, Pick up Object from [place] to pick up shoe safely and easily    From Standing Position, Turn to Look Behind Over each Shoulder Looks behind from both sides and weight shifts well    Turn 360 Degrees Able to turn 360 degrees safely but slowly    Standing Unsupported, Alternately Place Feet on Step/Stool Able to stand independently and safely and complete 8 steps in 20 seconds    Standing Unsupported, One Foot in [employer] balance while stepping or standing    Standing on One Leg Able to lift leg independently and hold 5-10 seconds    Total Score 46      Timed Up and Go Test   Normal TUG (seconds) --   15.34 sec; no AD                       Objective measurements completed on examination: See above findings.               PT Education - 10/08/20 1028     Education Details prognosis, POC, HEP- to be performed with son's help; advised patient to avoid wearing flip flops right now d/t fall risk    Person(s) Educated Patient    Methods Explanation;Demonstration;Tactile cues;Verbal cues;Handout    Comprehension Verbalized understanding;Returned demonstration              PT Short Term Goals - 10/08/20 1036       PT SHORT TERM GOAL #1   Title Patient to be mod I/independent with initial HEP.    Time 3    Period Weeks    Status New    Target Date 10/29/20               PT Long Term Goals - 10/08/20 1037       PT LONG TERM GOAL #1   Title Patient to be mod I/independent with advanced HEP.    Time 8    Period Weeks    Status New    Target Date 12/03/20      PT LONG TERM GOAL #2   Title Patient to demonstrate B LE strength >/=4+/5.    Time 8    Period Weeks    Status New    Target Date 12/03/20      PT LONG TERM GOAL #3   Title Patient to demonstrate alternating reciprocal pattern when  ascending and descending stairs with good stability and 1 handrail as needed.    Time 8    Period Weeks    Status New    Target Date 12/03/20      PT LONG TERM GOAL #4   Title Patient to complete TUG in <14 sec with LRAD in order to decrease risk of falls.    Time 8    Period Weeks    Status New    Target Date 12/03/20      PT LONG TERM GOAL #5   Title Patient to demonstrate atleast -10 degrees of L ankle dorsiflexion AROM for improved gait pattern.    Time 8    Period Weeks    Status New    Target Date 12/03/20                    Plan - 10/08/20 1030     Clinical Impression Statement Patient is a 60 y/o F presenting to OPPT s/p large R  ACA infarct and small L ACA infarct on 08/31/20. Patient had recently undergone repair of R ACA aneurysm on 08/25/20. S/p CVA, patient participated in inpatient rehab from 09/03/20-09/23/20 and discharged with RW. Today patient ambulates with AD. Notes fatigue and L LE weakness. Does mention imbalance and dizziness, but unable to specify when or how often. Noting intermittent L dorsal foot pain but unable to specify aggravating/easing factors. Patient today presenting with rounded and flexed posture, edema in B dorsal and lateral aspect of B feet and ankles with TTP on the L, limited L>R ankle dorsiflexion ROM, marked L LE weakness, gait deviations, and imbalance. Patient's score on TUG indicates an increased risk of falls. Patient was educated on gentle stretching and strengthening HEP and reported understanding. Would benefit from skilled PT services 2x/week for 8 weeks to address aforementioned impairments.    Personal Factors and Comorbidities Age;Comorbidity 3+;Time since onset of injury/illness/exacerbation;Fitness;Transportation    Comorbidities HLD, repair of right ACA aneurysm, left MCA aneurysm (unrepaired)    Examination-Activity Limitations Bathing;Locomotion Level;Transfers;Bend;Carry;Squat;Dressing;Stairs;Stand;Hygiene/Grooming;Lift     Examination-Participation Restrictions Laundry;Shop;Driving;Community Activity;Cleaning;Occupation;Church;Meal Prep    Stability/Clinical Decision Making Evolving/Moderate complexity    Clinical Decision Making Moderate    Rehab Potential Good    PT Frequency 2x / week    PT Duration 8 weeks    PT Treatment/Interventions ADLs/Self Care Home Management;Canalith Repostioning;Cryotherapy;Electrical Stimulation;DME Instruction;Ultrasound;Moist Heat;Gait training;Stair training;Functional mobility training;Therapeutic activities;Therapeutic exercise;Balance training;Neuromuscular re-education;Manual techniques;Patient/family education;Passive range of motion;Dry needling;Energy conservation;Vestibular;Taping    PT Next Visit Plan reassess HEP; trial gait training with SPC, work on L calf stretching, L LE strengthening, stairs    Consulted and Agree with Plan of Care Patient             Patient will benefit from skilled therapeutic intervention in order to improve the following deficits and impairments:  Abnormal gait, Decreased coordination, Decreased range of motion, Difficulty walking, Increased fascial restricitons, Dizziness, Decreased activity tolerance, Decreased safety awareness, Pain, Decreased balance, Impaired flexibility, Improper body mechanics, Postural dysfunction, Increased edema, Decreased strength, Decreased mobility  Visit Diagnosis: Hemiplegia and hemiparesis following cerebral infarction affecting left non-dominant side (HCC)  Muscle weakness (generalized)  Stiffness of left ankle, not elsewhere classified  Other abnormalities of gait and mobility  Unsteadiness on feet     Problem List Patient Active Problem List   Diagnosis Date Noted   Acute ischemic cerebrovascular accident (CVA) involving anterior cerebral artery territory ([place]) 09/03/2020   Acute CVA (cerebrovascular accident) ([place]) 09/01/2020   Acute ischemic stroke ([place]) 08/31/2020   Aneurysm of anterior  communicating artery 08/25/2020   Aneurysm, cerebral, nonruptured 08/25/2020   Microcytic anemia 10/26/2014   Vitamin D deficiency 10/17/2014   Stenosing tenosynovitis of thumb 10/17/2014   Internal hemorrhoids 10/17/2014   History of colon polyps 10/17/2014   Osteoarthritis of carpometacarpal joints of both thumbs 04/04/2014   Pre-diabetes 08/18/2013   History of TIA (transient ischemic attack) 08/12/2013   Routine health maintenance 08/15/2010   Hyperlipidemia 08/05/2008   Obesity 07/30/2007   TOBACCO DEPENDENCE 04/19/2006    Janene Harvey, PT, DPT 10/08/20 10:41 AM   [facility]. [place], [place], [address] Phone: [phone]   Fax:  [phone]  Name: Betty Archer MRN: [id number] Date of Birth: 1961/02/13

## 2020-10-08 NOTE — Patient Instructions (Signed)
Access Code: [email or web address] URL: [email or web address] Date: 10/08/2020 Prepared by: Grayling Congress  Exercises Gastroc Stretch on Wall - 2 x daily - 7 x weekly - 2 sets - 30 sec hold Mini Squat with Counter Support - 2 x daily - 7 x weekly - 2 sets - 10 reps Seated Hamstring Curl with Anchored Resistance - 2 x daily - 7 x weekly - 2 sets - 10 reps Sitting Knee Extension with Resistance - 2 x daily - 7 x weekly - 2 sets - 10 reps

## 2020-10-08 NOTE — Therapy (Signed)
[place]. [place], [place], [address] Phone: [phone]   Fax:  [phone]  Speech Language Pathology Treatment  Patient Details  Name: Betty Archer MRN: [id number] Date of Birth: [birth year] Referring Provider (SLP): Reesa Chew [place]   Encounter Date: 10/08/2020   End of Session - 10/08/20 1025     Visit Number 2    Number of Visits 17    Date for SLP Re-Evaluation 12/07/20    SLP Start Time 1020    SLP Stop Time  1100    SLP Time Calculation (min) 40 min    Activity Tolerance Patient tolerated treatment well             Past Medical History:  Diagnosis Date   Hyperlipidemia     Past Surgical History:  Procedure Laterality Date   BREAST BIOPSY      Core biopsy done on April 2003   COLONOSCOPY     IR 3D INDEPENDENT WKST  08/25/2020   IR 3D INDEPENDENT WKST  08/25/2020   IR ANGIO INTRA EXTRACRAN SEL INTERNAL CAROTID BILAT MOD SED  08/25/2020   IR ANGIO VERTEBRAL SEL VERTEBRAL BILAT MOD SED  08/25/2020   IR CT HEAD LTD  08/25/2020   IR INTRA CRAN STENT  08/25/2020   IR TRANSCATH/EMBOLIZ  08/25/2020   IR US GUIDE VASC ACCESS RIGHT  08/25/2020   PARTIAL HYSTERECTOMY   10/22/1999    Still has cervix   RADIOLOGY WITH ANESTHESIA N/A 08/25/2020   Procedure: IR WITH ANESTHESIA EMBOLIZATION;  Surgeon: Pedro Earls, MD;  Location: [facility];  Service: Radiology;  Laterality: N/A;   TRIGGER FINGER RELEASE Left 12/25/2013   Procedure: LEFT THUMB TRIGGER RELEASE ;  Surgeon: Leanora Cover, MD;  Location: [facility];  Service: Orthopedics;  Laterality: Left;    There were no vitals filed for this visit.   Subjective Assessment - 10/08/20 1021     Subjective Pt reports she is feeling tired this morning.    Currently in Pain? No/denies                   ADULT SLP TREATMENT - 10/08/20 1024       General Information   Behavior/Cognition Alert;Cooperative;Pleasant mood      Treatment  Provided   Treatment provided Cognitive-Linquistic      Cognitive-Linquistic Treatment   Treatment focused on Cognition    Skilled Treatment SLP assessed pt with medication management this session. Pt required correctly complete 1/3 medication bottle tasks. She required modA to complete overall. Pt completed abstract organizatoin task with no redirections needed for attention in a quiet environemtn.      Assessment / Recommendations / Plan   Plan Continue with current plan of care      Progression Toward Goals   Progression toward goals Progressing toward goals                SLP Short Term Goals - 10/07/20 1348       SLP SHORT TERM GOAL #1   Title Pt will recall orientation (time and situation) post 10 minutes using spaced retrieval therapy.    Time 4    Period Weeks    Status New    Target Date 11/07/20      SLP SHORT TERM GOAL #2   Title Demonstrate sustained attention by maintaining focus during a task for 10 minutes with <1 verbal cue in a quiet environment.  Time 4    Period Weeks    Status New    Target Date 11/07/20      SLP SHORT TERM GOAL #3   Title Pt will read 3 prescription labels and organize corresponding pills into pill box with supervision from SLP.    Time 4    Period Weeks    Status New    Target Date 11/07/20              SLP Long Term Goals - 10/07/20 1345       SLP LONG TERM GOAL #1   Title Patient will be appropriately oriented to person, place, time, and situation without cues to improve safety and awareness in functional living environment.    Time 8    Period Weeks    Status New    Target Date 12/07/20      SLP LONG TERM GOAL #2   Title Patient will develop sustained attention skills to effectively attend to and communicate in simple daily living tasks in functional living environment.    Time 8    Period Weeks    Status New    Target Date 12/07/20      SLP LONG TERM GOAL #3   Title Pt will organize medication into pill  box with minA from pt family.    Time 8    Period Weeks    Status New    Target Date 12/07/20              Plan - 10/08/20 1026     Clinical Impression Statement See tx note. Pt was responsive to cueing. SLP rec ST services to address cognitive-communication deficit to assist pt with returning to independence in all ADLs.    Speech Therapy Frequency 2x / week    Duration 8 weeks    Treatment/Interventions Functional tasks;Patient/family education;Cueing hierarchy;Environmental controls;Cognitive reorganization;Compensatory techniques;Internal/external aids;SLP instruction and feedback;Multimodal communcation approach    Potential to Achieve Goals Good    Consulted and Agree with Plan of Care Patient;Family member/caregiver    Family Member [facility] - son             Patient will benefit from skilled therapeutic intervention in order to improve the following deficits and impairments:   Cognitive communication deficit    Problem List Patient Active Problem List   Diagnosis Date Noted   Acute ischemic cerebrovascular accident (CVA) involving anterior cerebral artery territory ([place]) 09/03/2020   Acute CVA (cerebrovascular accident) ([place]) 09/01/2020   Acute ischemic stroke ([place]) 08/31/2020   Aneurysm of anterior communicating artery 08/25/2020   Aneurysm, cerebral, nonruptured 08/25/2020   Microcytic anemia 10/26/2014   Vitamin D deficiency 10/17/2014   Stenosing tenosynovitis of thumb 10/17/2014   Internal hemorrhoids 10/17/2014   History of colon polyps 10/17/2014   Osteoarthritis of carpometacarpal joints of both thumbs 04/04/2014   Pre-diabetes 08/18/2013   History of TIA (transient ischemic attack) 08/12/2013   Routine health maintenance 08/15/2010   Hyperlipidemia 08/05/2008   Obesity 07/30/2007   TOBACCO DEPENDENCE 04/19/2006    Rosann Auerbach Hallam MS, [place], CBIS  10/08/2020, 10:59 AM  [place]. [place], [place], [address] Phone: [phone]   Fax:  [phone]   Name: Betty Archer MRN: [id number] Date of Birth: 20-Oct-1960

## 2020-10-12 ENCOUNTER — Encounter: Payer: Self-pay | Admitting: Occupational Therapy

## 2020-10-12 ENCOUNTER — Ambulatory Visit: Payer: Self-pay | Admitting: Occupational Therapy

## 2020-10-12 ENCOUNTER — Ambulatory Visit: Payer: Self-pay | Admitting: Speech Pathology

## 2020-10-12 ENCOUNTER — Other Ambulatory Visit: Payer: Self-pay

## 2020-10-12 ENCOUNTER — Encounter: Payer: Self-pay | Admitting: Speech Pathology

## 2020-10-12 DIAGNOSIS — R41842 Visuospatial deficit: Secondary | ICD-10-CM

## 2020-10-12 DIAGNOSIS — I69319 Unspecified symptoms and signs involving cognitive functions following cerebral infarction: Secondary | ICD-10-CM

## 2020-10-12 DIAGNOSIS — R41840 Attention and concentration deficit: Secondary | ICD-10-CM

## 2020-10-12 DIAGNOSIS — R41841 Cognitive communication deficit: Secondary | ICD-10-CM

## 2020-10-12 DIAGNOSIS — R278 Other lack of coordination: Secondary | ICD-10-CM

## 2020-10-12 NOTE — Therapy (Signed)
[place]. [place], [place], [address] Phone: [phone]   Fax:  [phone]  Occupational Therapy Treatment  Patient Details  Name: Betty Archer MRN: [id number] Date of Birth: [birth year] Referring Provider (OT): Reesa Chew, [place]   Encounter Date: 10/12/2020   OT End of Session - 10/12/20 1452     Visit Number 2    Date for OT Re-Evaluation 01/04/21    Authorization Type Self-pay    OT Start Time 1445    OT Stop Time 1526    OT Time Calculation (min) 41 min    Activity Tolerance Patient tolerated treatment well    Behavior During Therapy [facility] for tasks assessed/performed            Past Medical History:  Diagnosis Date   Hyperlipidemia     Past Surgical History:  Procedure Laterality Date   BREAST BIOPSY      Core biopsy done on April 2003   COLONOSCOPY     IR 3D INDEPENDENT WKST  08/25/2020   IR 3D INDEPENDENT WKST  08/25/2020   IR ANGIO INTRA EXTRACRAN SEL INTERNAL CAROTID BILAT MOD SED  08/25/2020   IR ANGIO VERTEBRAL SEL VERTEBRAL BILAT MOD SED  08/25/2020   IR CT HEAD LTD  08/25/2020   IR INTRA CRAN STENT  08/25/2020   IR TRANSCATH/EMBOLIZ  08/25/2020   IR US GUIDE VASC ACCESS RIGHT  08/25/2020   PARTIAL HYSTERECTOMY   10/22/1999    Still has cervix   RADIOLOGY WITH ANESTHESIA N/A 08/25/2020   Procedure: IR WITH ANESTHESIA EMBOLIZATION;  Surgeon: Pedro Earls, MD;  Location: [facility];  Service: Radiology;  Laterality: N/A;   TRIGGER FINGER RELEASE Left 12/25/2013   Procedure: LEFT THUMB TRIGGER RELEASE ;  Surgeon: Leanora Cover, MD;  Location: [facility];  Service: Orthopedics;  Laterality: Left;    There were no vitals filed for this visit.   Subjective Assessment - 10/12/20 1449     Subjective  Pt states she does not recall what she worked on in PT last week    Patient is accompanied by: Family member   son (was not in treatment room during evaluation session)   Pertinent  History Recent hx: 6 mm R A2/ACA aneurysm and 8 mm L MCA bifurcation aneurysm; PMH includes HTN, HLD, and tobacco use    Limitations Cognitive deficits (orientation, awareness, attention, safety/judgement, self-monitoring, problem-solving)    Patient Stated Goals Difficult to obtain due to cognitive deficits    Currently in Pain? Yes    Pain Location Hip    Pain Orientation Left    Pain Descriptors / Indicators Sore    Pain Onset In the past 7 days    Pain Frequency Intermittent             Treatment/Exercises - 10/12/20    Visual Scanning Tabletop icon cancellation activity w/ pt identifying 22/24 icons on 1st attempt; pt self-initiated double-checking activity and was able to identify remaining 2 icons w/out cueing. OT introduced visual compensatory strategies during activity to facilitate decreased visual stimulation/demand and pt returned demo of strategies w/out difficulty    Maze  Intermediate tabletop maze completed to facilitate visual perception, visual memory, problem-solving, and praxis. Pt filled in every pathway in first attempt; unable to determine if this was due to decreased understanding, difficulty terminating task or other. OT then provided modeling and pt was able to complete activity w/ 4 errors and no add'l  cues in 2nd attempt    Word Search Intermediate tabletop word search completed to facilitate visual scanning, alternating attention, and eye-hand coordination/targeting; pt able to complete activity w/ 100% accuracy w/out difficulty    PVC Pipe Tree Constructional activity w/ PVC pipe tree to facilitate bilateral integration, alternating attention, visual perception/memory, and problem solving; pt able to complete activity w/ no cues or errors         OT Education - 10/12/20 1521     Education Details Reviewed goals w/ pt and introduced visual compensatory strategies    Person(s) Educated Patient    Methods Explanation;Demonstration;Handout    Comprehension  Verbalized understanding;Returned demonstration             OT Short Term Goals - 10/12/20 1452       OT SHORT TERM GOAL #1   Title Pt will demonstrate understanding of visual compensatory strategies to improve safety and participation during ADLs    Time 3    Period Weeks    Status Achieved    Target Date 10/29/20      OT SHORT TERM GOAL #2   Title Pt will appropriately terminate task independently during therapeutic activity (e.g., tabletop activity, IADL task, etc)    Time 3    Period Weeks    Status Achieved             OT Long Term Goals - 10/12/20 1452       OT LONG TERM GOAL #1   Title Pt will demonstrate improved processing speed for improved safety during cooking tasks as evidenced by decreasing Trail Making Test: Part A time by at least 10 sec    Baseline TMT: Part A in 1 min, 11 sec (2 errors)    Time 6    Period Weeks    Status On-going      OT LONG TERM GOAL #2   Title Pt will demonstrate independence w/ HEP designed for UB control/coordination and FM skills    Baseline 9HPT in 37 sec w/ LUE; 29 sec w/ RUE    Time 6    Period Weeks    Status On-going      OT LONG TERM GOAL #3   Title Pt will safely complete simple, multi-step meal prep activity w/ less than 2 verbal cues by d/c    Time 6    Period Weeks    Status On-going      OT LONG TERM GOAL #4   Title Pt will demonstrate appropriate initiation/termination of light housekeeping tasks (e.g., unloading dishwasher, making the bed, cleaning countertop) w/ Mod I in at least 2 trials    Time 6    Period Weeks    Status On-going             Plan - 10/12/20 1453     Clinical Impression Statement Pt did very well today, meeting 2/2 STGs this session. Pt was unable to recall what she worked on in [place] or PT sessions last week nor any visual compensatory strategies practiced at the beginning of tx session, but was able to complete various tabletop activities very minimal cueing, indicating  limitations w/ short-term memory but improved working memory, sequencing, and visual perception compared w/ evaluation session.    OT Occupational Profile and History Detailed Assessment- Review of Records and additional review of physical, cognitive, psychosocial history related to current functional performance    Occupational performance deficits (Please refer to evaluation for details): ADL's;IADL's;Rest and Sleep;Leisure;Social Participation  Body Structure / Function / Physical Skills ADL;Decreased knowledge of precautions;GMC;UE functional use;Strength;Vision;Sensation;IADL;Balance;Endurance;FMC;Body mechanics;Coordination;Mobility    Cognitive Skills Attention;Memory;Safety Awareness;Problem Solve;Orientation;Perception    Rehab Potential Good    Clinical Decision Making Several treatment options, min-mod task modification necessary    Comorbidities Affecting Occupational Performance: May have comorbidities impacting occupational performance    Modification or Assistance to Complete Evaluation  Min-Moderate modification of tasks or assist with assess necessary to complete eval    OT Frequency 1x / week    OT Duration 6 weeks    OT Treatment/Interventions Self-care/ADL training;Therapeutic exercise;Energy conservation;Therapist, nutritional;Therapeutic activities;Visual/perceptual remediation/compensation;Balance training;Psychosocial skills training;Patient/family education;Cognitive remediation/compensation;Manual Therapy;DME and/or AE instruction;Neuromuscular education    Plan Review visual compensatory strategies prn and introduce coordination HEP; assess multi-step IADL (meal prep, simulated shopping, laundry sequencing)    Recommended Other Services Pt is currently receiving SLP and PT services at this clinic    Consulted and Agree with Plan of Care Patient             Patient will benefit from skilled therapeutic intervention in order to improve the following deficits and  impairments:   Body Structure / Function / Physical Skills: ADL, Decreased knowledge of precautions, GMC, UE functional use, Strength, Vision, Sensation, IADL, Balance, Endurance, FMC, Body mechanics, Coordination, Mobility Cognitive Skills: Attention, Memory, Safety Awareness, Problem Solve, Orientation, Perception   Visit Diagnosis: Visuospatial deficit  Unspecified symptoms and signs involving cognitive functions following cerebral infarction  Attention and concentration deficit  Other lack of coordination    Problem List Patient Active Problem List   Diagnosis Date Noted   Acute ischemic cerebrovascular accident (CVA) involving anterior cerebral artery territory ([place]) 09/03/2020   Acute CVA (cerebrovascular accident) ([place]) 09/01/2020   Acute ischemic stroke ([place]) 08/31/2020   Aneurysm of anterior communicating artery 08/25/2020   Aneurysm, cerebral, nonruptured 08/25/2020   Microcytic anemia 10/26/2014   Vitamin D deficiency 10/17/2014   Stenosing tenosynovitis of thumb 10/17/2014   Internal hemorrhoids 10/17/2014   History of colon polyps 10/17/2014   Osteoarthritis of carpometacarpal joints of both thumbs 04/04/2014   Pre-diabetes 08/18/2013   History of TIA (transient ischemic attack) 08/12/2013   Routine health maintenance 08/15/2010   Hyperlipidemia 08/05/2008   Obesity 07/30/2007   TOBACCO DEPENDENCE 04/19/2006     Kathrine Cords, OTR/L, MSOT 10/12/2020, 4:59 PM  [place]. [place], [place], [address] Phone: [phone]   Fax:  [phone]  Name: Betty Archer MRN: [id number] Date of Birth: [birth year]

## 2020-10-12 NOTE — Therapy (Signed)
[place]. [place], [place], [address] Phone: [phone]   Fax:  [phone]  Speech Language Pathology Treatment  Patient Details  Name: Betty Archer MRN: [id number] Date of Birth: 06/14/1960 Referring Provider (SLP): Reesa Chew [place]   Encounter Date: 10/12/2020   End of Session - 10/12/20 1640     Visit Number 3    Number of Visits 17    Date for SLP Re-Evaluation 12/07/20    SLP Start Time [id number]    SLP Stop Time  [id number]    SLP Time Calculation (min) 45 min    Activity Tolerance Patient tolerated treatment well             Past Medical History:  Diagnosis Date   Hyperlipidemia     Past Surgical History:  Procedure Laterality Date   BREAST BIOPSY      Core biopsy done on April 2003   COLONOSCOPY     IR 3D INDEPENDENT WKST  08/25/2020   IR 3D INDEPENDENT WKST  08/25/2020   IR ANGIO INTRA EXTRACRAN SEL INTERNAL CAROTID BILAT MOD SED  08/25/2020   IR ANGIO VERTEBRAL SEL VERTEBRAL BILAT MOD SED  08/25/2020   IR CT HEAD LTD  08/25/2020   IR INTRA CRAN STENT  08/25/2020   IR TRANSCATH/EMBOLIZ  08/25/2020   IR US GUIDE VASC ACCESS RIGHT  08/25/2020   PARTIAL HYSTERECTOMY   10/22/1999    Still has cervix   RADIOLOGY WITH ANESTHESIA N/A 08/25/2020   Procedure: IR WITH ANESTHESIA EMBOLIZATION;  Surgeon: Pedro Earls, MD;  Location: [facility];  Service: Radiology;  Laterality: N/A;   TRIGGER FINGER RELEASE Left 12/25/2013   Procedure: LEFT THUMB TRIGGER RELEASE ;  Surgeon: Leanora Cover, MD;  Location: [facility];  Service: Orthopedics;  Laterality: Left;    There were no vitals filed for this visit.   Subjective Assessment - 10/12/20 1619     Subjective Pt was more interactive and expressive today w SLP. Observed less flat affect.    Currently in Pain? Yes    Pain Score 7     Pain Location Hip    Pain Orientation Left    Pain Descriptors / Indicators Sore    Pain Type Acute pain    Pain  Onset In the past 7 days    Pain Frequency Intermittent    Pain Relieving Factors no pain medication                   ADULT SLP TREATMENT - 10/12/20 0001       General Information   Behavior/Cognition Alert;Cooperative;Pleasant mood      Treatment Provided   Treatment provided Cognitive-Linquistic      Cognitive-Linquistic Treatment   Treatment focused on Cognition    Skilled Treatment SLP introduced spaced retrieval to assist with orientation of pt. SLP challenged the pt to provide reasoning for her therapy visit. After the SLP reviewed the reason for therapeutic services with pt, the pt required mod cueing to recall reason (stroke/work on memory). Pt participated in a medicine management task achieving 70% accuracy independently. Pt required minA-modA to complete this task.      Assessment / Recommendations / Plan   Plan Continue with current plan of care      Progression Toward Goals   Progression toward goals Progressing toward goals                SLP Short  Term Goals - 10/12/20 1641       SLP SHORT TERM GOAL #1   Title Pt will recall orientation (time and situation) post 10 minutes using spaced retrieval therapy.    Time 4    Period Weeks    Status On-going    Target Date 11/07/20      SLP SHORT TERM GOAL #2   Title Demonstrate sustained attention by maintaining focus during a task for 10 minutes with <1 verbal cue in a quiet environment.    Time 4    Period Weeks    Status On-going    Target Date 11/07/20      SLP SHORT TERM GOAL #3   Title Pt will read 3 prescription labels and organize corresponding pills into pill box with supervision from SLP.    Time 4    Period Weeks    Status On-going    Target Date 11/07/20              SLP Long Term Goals - 10/12/20 1641       SLP LONG TERM GOAL #1   Title Patient will be appropriately oriented to person, place, time, and situation without cues to improve safety and awareness in functional  living environment.    Time 8    Period Weeks    Status On-going      SLP LONG TERM GOAL #2   Title Patient will develop sustained attention skills to effectively attend to and communicate in simple daily living tasks in functional living environment.    Time 8    Period Weeks    Status On-going      SLP LONG TERM GOAL #3   Title Pt will organize medication into pill box with minA from pt family.    Time 8    Period Weeks    Status On-going              Plan - 10/12/20 1640     Clinical Impression Statement See tx note. Pt was responsive to cueing. SLP rec ST services to address cognitive-communication deficit to assist pt with returning to independence in all ADLs.    Speech Therapy Frequency 2x / week    Duration 8 weeks    Treatment/Interventions Functional tasks;Patient/family education;Cueing hierarchy;Environmental controls;Cognitive reorganization;Compensatory techniques;Internal/external aids;SLP instruction and feedback;Multimodal communcation approach    Potential to Achieve Goals Good    Consulted and Agree with Plan of Care Patient;Family member/caregiver    Family Member [facility] - son             Patient will benefit from skilled therapeutic intervention in order to improve the following deficits and impairments:   Cognitive communication deficit    Problem List Patient Active Problem List   Diagnosis Date Noted   Acute ischemic cerebrovascular accident (CVA) involving anterior cerebral artery territory ([place]) 09/03/2020   Acute CVA (cerebrovascular accident) ([place]) 09/01/2020   Acute ischemic stroke ([place]) 08/31/2020   Aneurysm of anterior communicating artery 08/25/2020   Aneurysm, cerebral, nonruptured 08/25/2020   Microcytic anemia 10/26/2014   Vitamin D deficiency 10/17/2014   Stenosing tenosynovitis of thumb 10/17/2014   Internal hemorrhoids 10/17/2014   History of colon polyps 10/17/2014   Osteoarthritis of carpometacarpal joints of  both thumbs 04/04/2014   Pre-diabetes 08/18/2013   History of TIA (transient ischemic attack) 08/12/2013   Routine health maintenance 08/15/2010   Hyperlipidemia 08/05/2008   Obesity 07/30/2007   TOBACCO DEPENDENCE 04/19/2006   During this treatment session,  the therapist was present, participating in and directing the treatment. *Therapy completed by SLP student, Danise Mina, and signed off by SLP*  [place], [place], CBIS  10/12/2020, 4:43 PM  [place]. [place], [place], [address] Phone: [phone]   Fax:  [phone]   Name: EULALA ISHAQ MRN: [id number] Date of Birth: 09/27/1960

## 2020-10-13 ENCOUNTER — Encounter: Payer: Self-pay | Admitting: Registered Nurse

## 2020-10-13 ENCOUNTER — Encounter: Payer: Self-pay | Attending: Registered Nurse | Admitting: Registered Nurse

## 2020-10-13 ENCOUNTER — Other Ambulatory Visit: Payer: Self-pay

## 2020-10-13 VITALS — BP 122/81 | HR 63 | Temp 98.5°F | Ht 66.0 in | Wt 192.0 lb

## 2020-10-13 DIAGNOSIS — I63529 Cerebral infarction due to unspecified occlusion or stenosis of unspecified anterior cerebral artery: Secondary | ICD-10-CM | POA: Insufficient documentation

## 2020-10-13 DIAGNOSIS — E7849 Other hyperlipidemia: Secondary | ICD-10-CM | POA: Insufficient documentation
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
## 2020-10-13 NOTE — Patient Instructions (Signed)
de Sindy Messing, Erven Colla, MD Follow up.  Specialties: Radiology, Interventional Radiology Why: Expected in 3-4 weeks.  Schedulers will call you to arrange. Contact information: [place] [address] [phone]

## 2020-10-14 ENCOUNTER — Encounter: Payer: Self-pay | Admitting: Speech Pathology

## 2020-10-14 ENCOUNTER — Ambulatory Visit: Payer: Self-pay | Admitting: Speech Pathology

## 2020-10-14 ENCOUNTER — Encounter: Payer: Self-pay | Admitting: Physical Therapy

## 2020-10-14 ENCOUNTER — Ambulatory Visit: Payer: Self-pay | Admitting: Physical Therapy

## 2020-10-14 DIAGNOSIS — I69354 Hemiplegia and hemiparesis following cerebral infarction affecting left non-dominant side: Secondary | ICD-10-CM

## 2020-10-14 DIAGNOSIS — R2689 Other abnormalities of gait and mobility: Secondary | ICD-10-CM

## 2020-10-14 DIAGNOSIS — R278 Other lack of coordination: Secondary | ICD-10-CM

## 2020-10-14 DIAGNOSIS — M6281 Muscle weakness (generalized): Secondary | ICD-10-CM

## 2020-10-14 DIAGNOSIS — I639 Cerebral infarction, unspecified: Secondary | ICD-10-CM

## 2020-10-14 DIAGNOSIS — M25672 Stiffness of left ankle, not elsewhere classified: Secondary | ICD-10-CM

## 2020-10-14 DIAGNOSIS — R2681 Unsteadiness on feet: Secondary | ICD-10-CM

## 2020-10-14 DIAGNOSIS — R41841 Cognitive communication deficit: Secondary | ICD-10-CM

## 2020-10-14 NOTE — Therapy (Signed)
[address] [place], [place], [address] Phone: [phone]   Fax:  [phone]  Physical Therapy Treatment  Patient Details  Name: Betty Archer MRN: [id number] Date of Birth: 1961-01-27 Referring Provider (PT): Bary Leriche, [place]   Encounter Date: 10/14/2020   PT End of Session - 10/14/20 1416     Visit Number 2    Number of Visits 17    Date for PT Re-Evaluation 12/03/20    Authorization Type Self Pay    PT Start Time [id number]    PT Stop Time 1444    PT Time Calculation (min) 40 min    Activity Tolerance Patient tolerated treatment well    Behavior During Therapy [facility] for tasks assessed/performed             Past Medical History:  Diagnosis Date   Hyperlipidemia     Past Surgical History:  Procedure Laterality Date   BREAST BIOPSY      Core biopsy done on April 2003   COLONOSCOPY     IR 3D INDEPENDENT WKST  08/25/2020   IR 3D INDEPENDENT WKST  08/25/2020   IR ANGIO INTRA EXTRACRAN SEL INTERNAL CAROTID BILAT MOD SED  08/25/2020   IR ANGIO VERTEBRAL SEL VERTEBRAL BILAT MOD SED  08/25/2020   IR CT HEAD LTD  08/25/2020   IR INTRA CRAN STENT  08/25/2020   IR TRANSCATH/EMBOLIZ  08/25/2020   IR US GUIDE VASC ACCESS RIGHT  08/25/2020   PARTIAL HYSTERECTOMY   10/22/1999    Still has cervix   RADIOLOGY WITH ANESTHESIA N/A 08/25/2020   Procedure: IR WITH ANESTHESIA EMBOLIZATION;  Surgeon: Pedro Earls, MD;  Location: [place];  Service: Radiology;  Laterality: N/A;   TRIGGER FINGER RELEASE Left 12/25/2013   Procedure: LEFT THUMB TRIGGER RELEASE ;  Surgeon: Leanora Cover, MD;  Location: [place];  Service: Orthopedics;  Laterality: Left;    There were no vitals filed for this visit.   Subjective Assessment - 10/14/20 1412     Subjective Patient denies falls and denies pain, mostly non verbal    Currently in Pain? No/denies                               [facility] Adult PT  Treatment/Exercise - 10/14/20 0001       High Level Balance   High Level Balance Activities Side stepping;Backward walking    High Level Balance Comments 6" toe touches, SBA mostly but had one time where she lost her balance and needed min A, cone toe touches, on firmer airex ball toss, on airex head turns      Exercises   Exercises Knee/Hip      Knee/Hip Exercises: Aerobic   Recumbent Bike level 1 x 5 minutes    Nustep level 4 x 6 minutes      Knee/Hip Exercises: Machines for Strengthening   Cybex Knee Extension 5# 2x10    Cybex Knee Flexion 20# 2x10      Knee/Hip Exercises: Seated   Other Seated Knee/Hip Exercises toe raises and heel raises                      PT Short Term Goals - 10/14/20 1442       PT SHORT TERM GOAL #1   Title Patient to be mod I/independent with initial HEP.    Status  On-going               PT Long Term Goals - 10/08/20 1037       PT LONG TERM GOAL #1   Title Patient to be mod I/independent with advanced HEP.    Time 8    Period Weeks    Status New    Target Date 12/03/20      PT LONG TERM GOAL #2   Title Patient to demonstrate B LE strength >/=4+/5.    Time 8    Period Weeks    Status New    Target Date 12/03/20      PT LONG TERM GOAL #3   Title Patient to demonstrate alternating reciprocal pattern when ascending and descending stairs with good stability and 1 handrail as needed.    Time 8    Period Weeks    Status New    Target Date 12/03/20      PT LONG TERM GOAL #4   Title Patient to complete TUG in <14 sec with LRAD in order to decrease risk of falls.    Time 8    Period Weeks    Status New    Target Date 12/03/20      PT LONG TERM GOAL #5   Title Patient to demonstrate atleast -10 degrees of L ankle dorsiflexion AROM for improved gait pattern.    Time 8    Period Weeks    Status New    Target Date 12/03/20                   Plan - 10/14/20 1438     Clinical Impression Statement Patient  is mostly non verbal but will answer, yes, no thank you type things.  She can speak and with open ended questions will answer them.  Some difficulty with distractions at times, but overall doing well with what we did today.  One loss of balance    PT Next Visit Plan continue to work on strength, balance and gait    Consulted and Agree with Plan of Care Patient             Patient will benefit from skilled therapeutic intervention in order to improve the following deficits and impairments:  Abnormal gait, Decreased coordination, Decreased range of motion, Difficulty walking, Increased fascial restricitons, Dizziness, Decreased activity tolerance, Decreased safety awareness, Pain, Decreased balance, Impaired flexibility, Improper body mechanics, Postural dysfunction, Increased edema, Decreased strength, Decreased mobility  Visit Diagnosis: Hemiplegia and hemiparesis following cerebral infarction affecting left non-dominant side (HCC)  Other lack of coordination  Muscle weakness (generalized)  Stiffness of left ankle, not elsewhere classified  Other abnormalities of gait and mobility  Unsteadiness on feet  Acute CVA (cerebrovascular accident) [facility])     Problem List Patient Active Problem List   Diagnosis Date Noted   Acute ischemic cerebrovascular accident (CVA) involving anterior cerebral artery territory ([place]) 09/03/2020   Acute CVA (cerebrovascular accident) ([place]) 09/01/2020   Acute ischemic stroke ([place]) 08/31/2020   Aneurysm of anterior communicating artery 08/25/2020   Aneurysm, cerebral, nonruptured 08/25/2020   Microcytic anemia 10/26/2014   Vitamin D deficiency 10/17/2014   Stenosing tenosynovitis of thumb 10/17/2014   Internal hemorrhoids 10/17/2014   History of colon polyps 10/17/2014   Osteoarthritis of carpometacarpal joints of both thumbs 04/04/2014   Pre-diabetes 08/18/2013   History of TIA (transient ischemic attack) 08/12/2013   Routine health maintenance  08/15/2010   Hyperlipidemia 08/05/2008   Obesity 07/30/2007  TOBACCO DEPENDENCE 04/19/2006    Sumner Boast., PT 10/14/2020, 2:42 PM  [address]. [place], [place], [address] Phone: [phone]   Fax:  [phone]  Name: Betty Archer MRN: [id number] Date of Birth: 03-01-1960

## 2020-10-14 NOTE — Therapy (Signed)
[place]. [place], [place], [address] Phone: [phone]   Fax:  [phone]  Speech Language Pathology Treatment  Patient Details  Name: Betty Archer MRN: [id number] Date of Birth: 01/18/1961 Referring Provider (SLP): Reesa Chew [place]   Encounter Date: 10/14/2020   End of Session - 10/14/20 1550     Visit Number 4    Number of Visits 17    Date for SLP Re-Evaluation 12/07/20    SLP Start Time 3536    SLP Stop Time  1530    SLP Time Calculation (min) 45 min    Activity Tolerance Patient tolerated treatment well             Past Medical History:  Diagnosis Date   Hyperlipidemia     Past Surgical History:  Procedure Laterality Date   BREAST BIOPSY      Core biopsy done on April 2003   COLONOSCOPY     IR 3D INDEPENDENT WKST  08/25/2020   IR 3D INDEPENDENT WKST  08/25/2020   IR ANGIO INTRA EXTRACRAN SEL INTERNAL CAROTID BILAT MOD SED  08/25/2020   IR ANGIO VERTEBRAL SEL VERTEBRAL BILAT MOD SED  08/25/2020   IR CT HEAD LTD  08/25/2020   IR INTRA CRAN STENT  08/25/2020   IR TRANSCATH/EMBOLIZ  08/25/2020   IR US GUIDE VASC ACCESS RIGHT  08/25/2020   PARTIAL HYSTERECTOMY   10/22/1999    Still has cervix   RADIOLOGY WITH ANESTHESIA N/A 08/25/2020   Procedure: IR WITH ANESTHESIA EMBOLIZATION;  Surgeon: Pedro Earls, MD;  Location: [facility];  Service: Radiology;  Laterality: N/A;   TRIGGER FINGER RELEASE Left 12/25/2013   Procedure: LEFT THUMB TRIGGER RELEASE ;  Surgeon: Leanora Cover, MD;  Location: [facility];  Service: Orthopedics;  Laterality: Left;    There were no vitals filed for this visit.   Subjective Assessment - 10/14/20 1447     Subjective Pt was willing to participate in today's tasks.    Currently in Pain? No/denies    Pain Score 0-No pain                   ADULT SLP TREATMENT - 10/14/20 1532       General Information   Behavior/Cognition  Alert;Cooperative;Pleasant mood      Treatment Provided   Treatment provided Cognitive-Linquistic      Cognitive-Linquistic Treatment   Treatment focused on Cognition    Skilled Treatment SLP introduced WRAP (write, repetition, association, and picture) memory recall strategies into today's session. Pt practiced WRAP strategies during a functional scenario with minA required from the SLP. Pt participated in a categorization task where the pt labeled category names and specific members of each category. Pt required modA to complete this task. SLP instructed the pt to practice WRAP strategies outside of the therapy setting. Pt met STG #1 x1. SLP revise goal next session.      Assessment / Recommendations / Plan   Plan Continue with current plan of care      Progression Toward Goals   Progression toward goals Progressing toward goals                SLP Short Term Goals - 10/14/20 1545       SLP SHORT TERM GOAL #1   Title Pt will recall orientation (time and situation) post 10 minutes using spaced retrieval therapy.    Time 4    Period  Weeks    Status On-going    Target Date 11/07/20      SLP SHORT TERM GOAL #2   Title Demonstrate sustained attention by maintaining focus during a task for 10 minutes with <1 verbal cue in a quiet environment.    Time 4    Period Weeks    Status On-going    Target Date 11/07/20      SLP SHORT TERM GOAL #3   Title Pt will read 3 prescription labels and organize corresponding pills into pill box with supervision from SLP.    Time 4    Period Weeks    Status On-going    Target Date 11/07/20              SLP Long Term Goals - 10/14/20 1548       SLP LONG TERM GOAL #1   Title Patient will be appropriately oriented to person, place, time, and situation without cues to improve safety and awareness in functional living environment.    Time 8    Period Weeks    Status On-going      SLP LONG TERM GOAL #2   Title Patient will develop  sustained attention skills to effectively attend to and communicate in simple daily living tasks in functional living environment.    Time 8    Period Weeks    Status On-going      SLP LONG TERM GOAL #3   Title Pt will organize medication into pill box with minA from pt family.    Time 8    Period Weeks    Status On-going              Plan - 10/14/20 1556     Clinical Impression Statement See tx note. Pt was responsive to cueing. SLP rec ST services to address cognitive-communication deficit to assist pt with returning to independence in all ADLs.    Speech Therapy Frequency 2x / week    Duration 8 weeks    Treatment/Interventions Functional tasks;Patient/family education;Cueing hierarchy;Environmental controls;Cognitive reorganization;Compensatory techniques;Internal/external aids;SLP instruction and feedback;Multimodal communcation approach    Potential to Achieve Goals Good    Consulted and Agree with Plan of Care Patient;Family member/caregiver    Family Member [facility] - son             Patient will benefit from skilled therapeutic intervention in order to improve the following deficits and impairments:   Cognitive communication deficit    Problem List Patient Active Problem List   Diagnosis Date Noted   Acute ischemic cerebrovascular accident (CVA) involving anterior cerebral artery territory ([place]) 09/03/2020   Acute CVA (cerebrovascular accident) ([place]) 09/01/2020   Acute ischemic stroke ([place]) 08/31/2020   Aneurysm of anterior communicating artery 08/25/2020   Aneurysm, cerebral, nonruptured 08/25/2020   Microcytic anemia 10/26/2014   Vitamin D deficiency 10/17/2014   Stenosing tenosynovitis of thumb 10/17/2014   Internal hemorrhoids 10/17/2014   History of colon polyps 10/17/2014   Osteoarthritis of carpometacarpal joints of both thumbs 04/04/2014   Pre-diabetes 08/18/2013   History of TIA (transient ischemic attack) 08/12/2013   Routine health  maintenance 08/15/2010   Hyperlipidemia 08/05/2008   Obesity 07/30/2007   TOBACCO DEPENDENCE 04/19/2006    [facility] Communication Sciences and Disorders  10/14/2020, 4:00 PM  [place]. [place], [place], [address] Phone: [phone]   Fax:  [phone]   Name: ZARIN HAGMANN MRN: [id number] Date  of Birth: [birth year]

## 2020-10-15 ENCOUNTER — Other Ambulatory Visit (HOSPITAL_COMMUNITY): Payer: Self-pay | Admitting: Neuroradiology

## 2020-10-15 DIAGNOSIS — I639 Cerebral infarction, unspecified: Secondary | ICD-10-CM

## 2020-10-18 ENCOUNTER — Telehealth: Payer: Self-pay

## 2020-10-18 NOTE — Telephone Encounter (Signed)
Return Betty Archer call, no answer. Left message to return the call.

## 2020-10-18 NOTE — Telephone Encounter (Signed)
[place], son of patient called requesting something to be prescribed to patient to help her sleep. She is not sleeping well. Pharmacy [facility] or [place]

## 2020-10-19 ENCOUNTER — Encounter: Payer: Self-pay | Admitting: Physical Therapy

## 2020-10-19 ENCOUNTER — Other Ambulatory Visit: Payer: Self-pay

## 2020-10-19 ENCOUNTER — Ambulatory Visit: Payer: Self-pay | Admitting: Physical Therapy

## 2020-10-19 ENCOUNTER — Encounter: Payer: Self-pay | Admitting: Speech Pathology

## 2020-10-19 ENCOUNTER — Ambulatory Visit: Payer: Self-pay | Admitting: Speech Pathology

## 2020-10-19 ENCOUNTER — Ambulatory Visit: Payer: Self-pay | Admitting: Occupational Therapy

## 2020-10-19 DIAGNOSIS — R2689 Other abnormalities of gait and mobility: Secondary | ICD-10-CM

## 2020-10-19 DIAGNOSIS — R41842 Visuospatial deficit: Secondary | ICD-10-CM

## 2020-10-19 DIAGNOSIS — M25672 Stiffness of left ankle, not elsewhere classified: Secondary | ICD-10-CM

## 2020-10-19 DIAGNOSIS — R41840 Attention and concentration deficit: Secondary | ICD-10-CM

## 2020-10-19 DIAGNOSIS — R41841 Cognitive communication deficit: Secondary | ICD-10-CM

## 2020-10-19 DIAGNOSIS — M6281 Muscle weakness (generalized): Secondary | ICD-10-CM

## 2020-10-19 DIAGNOSIS — I69319 Unspecified symptoms and signs involving cognitive functions following cerebral infarction: Secondary | ICD-10-CM

## 2020-10-19 DIAGNOSIS — R2681 Unsteadiness on feet: Secondary | ICD-10-CM

## 2020-10-19 DIAGNOSIS — R278 Other lack of coordination: Secondary | ICD-10-CM

## 2020-10-19 DIAGNOSIS — I69354 Hemiplegia and hemiparesis following cerebral infarction affecting left non-dominant side: Secondary | ICD-10-CM

## 2020-10-19 NOTE — Therapy (Signed)
[place]. [place], [place], [address] Phone: [phone]   Fax:  [phone]  Physical Therapy Treatment  Patient Details  Name: Betty Archer MRN: [id number] Date of Birth: December 16, 1960 Referring Provider (PT): Bary Leriche, [place]   Encounter Date: 10/19/2020   PT End of Session - 10/19/20 1403     Visit Number 3    Number of Visits 17    Date for PT Re-Evaluation 12/03/20    Authorization Type Self Pay    PT Start Time 1316    PT Stop Time 1401    PT Time Calculation (min) 45 min    Equipment Utilized During Treatment Gait belt    Activity Tolerance Patient tolerated treatment well    Behavior During Therapy [facility] for tasks assessed/performed;Flat affect             Past Medical History:  Diagnosis Date   Hyperlipidemia     Past Surgical History:  Procedure Laterality Date   BREAST BIOPSY      Core biopsy done on April 2003   COLONOSCOPY     IR 3D INDEPENDENT WKST  08/25/2020   IR 3D INDEPENDENT WKST  08/25/2020   IR ANGIO INTRA EXTRACRAN SEL INTERNAL CAROTID BILAT MOD SED  08/25/2020   IR ANGIO VERTEBRAL SEL VERTEBRAL BILAT MOD SED  08/25/2020   IR CT HEAD LTD  08/25/2020   IR INTRA CRAN STENT  08/25/2020   IR TRANSCATH/EMBOLIZ  08/25/2020   IR US GUIDE VASC ACCESS RIGHT  08/25/2020   PARTIAL HYSTERECTOMY   10/22/1999    Still has cervix   RADIOLOGY WITH ANESTHESIA N/A 08/25/2020   Procedure: IR WITH ANESTHESIA EMBOLIZATION;  Surgeon: Pedro Earls, MD;  Location: [facility];  Service: Radiology;  Laterality: N/A;   TRIGGER FINGER RELEASE Left 12/25/2013   Procedure: LEFT THUMB TRIGGER RELEASE ;  Surgeon: Leanora Cover, MD;  Location: [facility];  Service: Orthopedics;  Laterality: Left;    There were no vitals filed for this visit.   Subjective Assessment - 10/19/20 1317     Subjective Has been doing her exercises at home and denies questions on them.    Pertinent History HLD,  repair of right ACA aneurysm, left MCA aneurysm (unrepaired)    Diagnostic tests 09/01/20 brain MRI: Acute Right ACA territory infarct, Ischemia also in the Left ACA territory    Patient Stated Goals patient unable to verbalize goals for therapy but agreeable to work on strengthening    Currently in Pain? No/denies                               [facility] Adult PT Treatment/Exercise - 10/19/20 0001       Ambulation/Gait   Ambulation Distance (Feet) 250 Feet    Assistive device Straight cane    Gait Pattern Step-through pattern;Decreased arm swing - right;Decreased arm swing - left;Poor foot clearance - left;Poor foot clearance - right;Trunk flexed    Ambulation Surface Level;Indoor    Gait velocity slightly decreased    Gait Comments gait training with SPC with cueing for proper AD sequencing; SPC placement      Knee/Hip Exercises: Stretches   Gastroc Stretch Right;Left;1 rep;30 seconds    Gastroc Stretch Limitations in II bars   cues to maintain heel down     Knee/Hip Exercises: Aerobic   Nustep level 5 x 6 minutes  Knee/Hip Exercises: Standing   Knee Flexion Strengthening;Left;2 sets;10 reps    Knee Flexion Limitations HS curl 3# in II bars    Forward Step Up Left;1 set;10 reps;Hand Hold: 1;Step Height: 6"    Forward Step Up Limitations step upp/back slow    Step Down Left;1 set;Step Height: 6";Hand Hold: 1    Step Down Limitations retro step up + step down   good eccentric speed   Functional Squat 1 set;10 reps    Functional Squat Limitations in II bars; manual cues to increase L wt shift    Other Standing Knee Exercises 4 square step CW/CCW 5x each; stepping back/forward over orange cone 5x each foot   most challenge with backwards stepping; cues to increase step height/length   Other Standing Knee Exercises forward/back stepping with/without UE support with L foot on foam 10x   instability without UE support                   PT Education -  10/19/20 1401     Education Details update to HEP- to be performed at counter top for safety; advised patient to use [facility] when walking outside in crowded places/compliant surfaces    Person(s) Educated Patient    Methods Explanation;Demonstration;Tactile cues;Verbal cues;Handout    Comprehension Verbalized understanding;Returned demonstration              PT Short Term Goals - 10/19/20 1539       PT SHORT TERM GOAL #1   Title Patient to be mod I/independent with initial HEP.    Status On-going               PT Long Term Goals - 10/19/20 1539       PT LONG TERM GOAL #1   Title Patient to be mod I/independent with advanced HEP.    Time 8    Period Weeks    Status On-going      PT LONG TERM GOAL #2   Title Patient to demonstrate B LE strength >/=4+/5.    Time 8    Period Weeks    Status On-going      PT LONG TERM GOAL #3   Title Patient to demonstrate alternating reciprocal pattern when ascending and descending stairs with good stability and 1 handrail as needed.    Time 8    Period Weeks    Status On-going      PT LONG TERM GOAL #4   Title Patient to complete TUG in <14 sec with LRAD in order to decrease risk of falls.    Time 8    Period Weeks    Status On-going      PT LONG TERM GOAL #5   Title Patient to demonstrate atleast -10 degrees of L ankle dorsiflexion AROM for improved gait pattern.    Time 8    Period Weeks    Status On-going                   Plan - 10/19/20 1403     Clinical Impression Statement Patient arrived to session without new complaints. Initiated gait training with [place] for improved safety. Patient demonstrated good stability and improved sequencing after practice. Standing LE strengthening ther-ex was performed with intermittent cueing for form and UE support throughout. Correction of form was required with gastroc stretch for max benefit. Good eccentric control was demonstrated with step downs today. Most balance challenges  were demonstrated on compliant surfaces and with backwards stepping  today. Updated HEP with exercises to work on backward stepping as this was safely performed today- patient reported understanding. No complaints at end of session. Patient is progressing well towards goals.    Personal Factors and Comorbidities Age;Comorbidity 3+;Time since onset of injury/illness/exacerbation;Fitness;Transportation    Comorbidities HLD, repair of right ACA aneurysm, left MCA aneurysm (unrepaired)    PT Treatment/Interventions ADLs/Self Care Home Management;Canalith Repostioning;Cryotherapy;Electrical Stimulation;DME Instruction;Ultrasound;Moist Heat;Gait training;Stair training;Functional mobility training;Therapeutic activities;Therapeutic exercise;Balance training;Neuromuscular re-education;Manual techniques;Patient/family education;Passive range of motion;Dry needling;Energy conservation;Vestibular;Taping    PT Next Visit Plan continue to work on strength, balance and gait    Consulted and Agree with Plan of Care Patient             Patient will benefit from skilled therapeutic intervention in order to improve the following deficits and impairments:  Abnormal gait, Decreased coordination, Decreased range of motion, Difficulty walking, Increased fascial restricitons, Dizziness, Decreased activity tolerance, Decreased safety awareness, Pain, Decreased balance, Impaired flexibility, Improper body mechanics, Postural dysfunction, Increased edema, Decreased strength, Decreased mobility  Visit Diagnosis: Hemiplegia and hemiparesis following cerebral infarction affecting left non-dominant side (HCC)  Muscle weakness (generalized)  Stiffness of left ankle, not elsewhere classified  Other abnormalities of gait and mobility  Unsteadiness on feet     Problem List Patient Active Problem List   Diagnosis Date Noted   Acute ischemic cerebrovascular accident (CVA) involving anterior cerebral artery territory  ([place]) 09/03/2020   Acute CVA (cerebrovascular accident) ([place]) 09/01/2020   Acute ischemic stroke ([place]) 08/31/2020   Aneurysm of anterior communicating artery 08/25/2020   Aneurysm, cerebral, nonruptured 08/25/2020   Microcytic anemia 10/26/2014   Vitamin D deficiency 10/17/2014   Stenosing tenosynovitis of thumb 10/17/2014   Internal hemorrhoids 10/17/2014   History of colon polyps 10/17/2014   Osteoarthritis of carpometacarpal joints of both thumbs 04/04/2014   Pre-diabetes 08/18/2013   History of TIA (transient ischemic attack) 08/12/2013   Routine health maintenance 08/15/2010   Hyperlipidemia 08/05/2008   Obesity 07/30/2007   TOBACCO DEPENDENCE 04/19/2006     Janene Harvey, PT, DPT 10/19/20 3:43 PM    [place]. [place], [place], [address] Phone: [phone]   Fax:  [phone]  Name: Betty Archer MRN: [id number] Date of Birth: 12-Apr-1960

## 2020-10-19 NOTE — Therapy (Signed)
[place]. [place], [place], [address] Phone: [phone]   Fax:  [phone]  Speech Language Pathology Treatment  Patient Details  Name: Betty Archer MRN: [id number] Date of Birth: [birth year] Referring Provider (SLP): Reesa Chew [place]   Encounter Date: 10/19/2020   End of Session - 10/19/20 1637     Visit Number 5    Number of Visits 17    Date for SLP Re-Evaluation 12/07/20    SLP Start Time 1400    SLP Stop Time  [id number]    SLP Time Calculation (min) 45 min    Activity Tolerance Patient tolerated treatment well             Past Medical History:  Diagnosis Date   Hyperlipidemia     Past Surgical History:  Procedure Laterality Date   BREAST BIOPSY      Core biopsy done on April 2003   COLONOSCOPY     IR 3D INDEPENDENT WKST  08/25/2020   IR 3D INDEPENDENT WKST  08/25/2020   IR ANGIO INTRA EXTRACRAN SEL INTERNAL CAROTID BILAT MOD SED  08/25/2020   IR ANGIO VERTEBRAL SEL VERTEBRAL BILAT MOD SED  08/25/2020   IR CT HEAD LTD  08/25/2020   IR INTRA CRAN STENT  08/25/2020   IR TRANSCATH/EMBOLIZ  08/25/2020   IR US GUIDE VASC ACCESS RIGHT  08/25/2020   PARTIAL HYSTERECTOMY   10/22/1999    Still has cervix   RADIOLOGY WITH ANESTHESIA N/A 08/25/2020   Procedure: IR WITH ANESTHESIA EMBOLIZATION;  Surgeon: Pedro Earls, MD;  Location: [facility];  Service: Radiology;  Laterality: N/A;   TRIGGER FINGER RELEASE Left 12/25/2013   Procedure: LEFT THUMB TRIGGER RELEASE ;  Surgeon: Leanora Cover, MD;  Location: [facility];  Service: Orthopedics;  Laterality: Left;    There were no vitals filed for this visit.   Subjective Assessment - 10/19/20 1402     Subjective Pt was willing to participate in today's tasks.    Currently in Pain? No/denies    Pain Onset In the past 7 days                   ADULT SLP TREATMENT - 10/19/20 1628       General Information   Behavior/Cognition  Alert;Cooperative      Treatment Provided   Treatment provided Cognitive-Linquistic      Cognitive-Linquistic Treatment   Treatment focused on Cognition    Skilled Treatment Pt participated in a grocery shopping list task targeting memory, attention, organization, and awareness. Pt selected recipes and organized ingredients by category (buttermilk - dairy). Pt adequately used sustained attention for 30 min on this task. She required modA to organize items into respective categories. Delayed initiation noted.      Progression Toward Goals   Progression toward goals Progressing toward goals                SLP Short Term Goals - 10/19/20 1639       SLP SHORT TERM GOAL #1   Title Pt will recall orientation (time and situation) post 10 minutes using spaced retrieval therapy.    Time 4    Period Weeks    Status On-going    Target Date 11/07/20      SLP SHORT TERM GOAL #2   Title Demonstrate sustained attention by maintaining focus during a task for 30 minutes with <1 verbal cue in a quiet environment.  Time 4    Period Weeks    Status Revised    Target Date 11/07/20      SLP SHORT TERM GOAL #3   Title Pt will read 3 prescription labels and organize corresponding pills into pill box with supervision from SLP.    Time 4    Period Weeks    Status On-going    Target Date 11/07/20              SLP Long Term Goals - 10/14/20 1548       SLP LONG TERM GOAL #1   Title Patient will be appropriately oriented to person, place, time, and situation without cues to improve safety and awareness in functional living environment.    Time 8    Period Weeks    Status On-going      SLP LONG TERM GOAL #2   Title Patient will develop sustained attention skills to effectively attend to and communicate in simple daily living tasks in functional living environment.    Time 8    Period Weeks    Status On-going      SLP LONG TERM GOAL #3   Title Pt will organize medication into pill  box with minA from pt family.    Time 8    Period Weeks    Status On-going              Plan - 10/19/20 1638     Clinical Impression Statement See tx note. Pt was responsive to cueing. SLP to updated goals to reflect progress in sustained attention.    Speech Therapy Frequency 2x / week    Duration 8 weeks    Treatment/Interventions Functional tasks;Patient/family education;Cueing hierarchy;Environmental controls;Cognitive reorganization;Compensatory techniques;Internal/external aids;SLP instruction and feedback;Multimodal communcation approach    Potential to Achieve Goals Good    Consulted and Agree with Plan of Care Patient;Family member/caregiver    Family Member [facility] - son             Patient will benefit from skilled therapeutic intervention in order to improve the following deficits and impairments:   Cognitive communication deficit    Problem List Patient Active Problem List   Diagnosis Date Noted   Acute ischemic cerebrovascular accident (CVA) involving anterior cerebral artery territory ([place]) 09/03/2020   Acute CVA (cerebrovascular accident) ([place]) 09/01/2020   Acute ischemic stroke ([place]) 08/31/2020   Aneurysm of anterior communicating artery 08/25/2020   Aneurysm, cerebral, nonruptured 08/25/2020   Microcytic anemia 10/26/2014   Vitamin D deficiency 10/17/2014   Stenosing tenosynovitis of thumb 10/17/2014   Internal hemorrhoids 10/17/2014   History of colon polyps 10/17/2014   Osteoarthritis of carpometacarpal joints of both thumbs 04/04/2014   Pre-diabetes 08/18/2013   History of TIA (transient ischemic attack) 08/12/2013   Routine health maintenance 08/15/2010   Hyperlipidemia 08/05/2008   Obesity 07/30/2007   TOBACCO DEPENDENCE 04/19/2006    Danise Mina B.S. Communication Sciences and Disorders  10/19/2020, 4:43 PM  [name] [place], [place], [address] Phone:  [phone]   Fax:  [phone]   Name: Betty Archer MRN: [id number] Date of Birth: 09/16/1960

## 2020-10-20 ENCOUNTER — Ambulatory Visit (HOSPITAL_COMMUNITY)
Admission: RE | Admit: 2020-10-20 | Discharge: 2020-10-20 | Disposition: A | Discharge status: Home / Self Care | Payer: No Typology Code available for payment source | Source: Ambulatory Visit | Attending: Neuroradiology | Admitting: Neuroradiology

## 2020-10-20 DIAGNOSIS — I639 Cerebral infarction, unspecified: Secondary | ICD-10-CM

## 2020-10-20 NOTE — Therapy (Signed)
[place]. [place], [place], [address] Phone: [phone]   Fax:  [phone]  Occupational Therapy Treatment  Patient Details  Name: Betty Archer MRN: [id number] Date of Birth: 1960/09/23 Referring Provider (OT): Reesa Chew, [place]   Encounter Date: 10/19/2020   OT End of Session - 10/19/20 1442     Visit Number 3    Date for OT Re-Evaluation 01/04/21    Authorization Type Self-pay    OT Start Time 1442    OT Stop Time 1523    OT Time Calculation (min) 41 min    Activity Tolerance Patient tolerated treatment well    Behavior During Therapy [facility] for tasks assessed/performed            Past Medical History:  Diagnosis Date   Hyperlipidemia     Past Surgical History:  Procedure Laterality Date   BREAST BIOPSY      Core biopsy done on April 2003   COLONOSCOPY     IR 3D INDEPENDENT WKST  08/25/2020   IR 3D INDEPENDENT WKST  08/25/2020   IR ANGIO INTRA EXTRACRAN SEL INTERNAL CAROTID BILAT MOD SED  08/25/2020   IR ANGIO VERTEBRAL SEL VERTEBRAL BILAT MOD SED  08/25/2020   IR CT HEAD LTD  08/25/2020   IR INTRA CRAN STENT  08/25/2020   IR TRANSCATH/EMBOLIZ  08/25/2020   IR US GUIDE VASC ACCESS RIGHT  08/25/2020   PARTIAL HYSTERECTOMY   10/22/1999    Still has cervix   RADIOLOGY WITH ANESTHESIA N/A 08/25/2020   Procedure: IR WITH ANESTHESIA EMBOLIZATION;  Surgeon: Pedro Earls, MD;  Location: [place];  Service: Radiology;  Laterality: N/A;   TRIGGER FINGER RELEASE Left 12/25/2013   Procedure: LEFT THUMB TRIGGER RELEASE ;  Surgeon: Leanora Cover, MD;  Location: [place];  Service: Orthopedics;  Laterality: Left;    There were no vitals filed for this visit.   Subjective Assessment - 10/19/20 1528     Subjective  Pt stated ADL sequencing activity seemed easy    Patient is accompanied by: Family member   son (was not in treatment room during evaluation session)   Pertinent History Recent hx: 6  mm R A2/ACA aneurysm and 8 mm L MCA bifurcation aneurysm; PMH includes HTN, HLD, and tobacco use    Limitations Cognitive deficits (orientation, awareness, attention, safety/judgement, self-monitoring, problem-solving)    Patient Stated Goals Difficult to obtain due to cognitive deficits    Currently in Pain? No/denies             Treatment/Exercises - 10/19/20    Coordination Activities Pt completed coordination activities w/ each UE/BUEs as appropriate, including rotating small ball w/ fingertips, tossing ball between UEs, tossing/catching ball w/ ipsilateral UE, flipping cards off a deck, rotating cards in-hand, pushing cards off deck using thumb, picking up coins and stacking them, and picking up coins 1 at a time and translating palm to fingertips to place in a container    ADLs: Sequencing 4-step sequencing activity requiring pt to correctly order tasks involved in ADLs. Pt able to complete 3/11 correctly, requiring Mod A and breakdown of tasks w/ remaining 8/11. OT provided grading prn and extended time for processing to facilitate success.          OT Education - 10/19/20 1505     Education Details Coordination HEP (handout administered)    Person(s) Educated Patient    Methods Explanation;Demonstration;Handout    Comprehension Verbalized  understanding;Returned demonstration             OT Short Term Goals - 10/12/20 1452       OT SHORT TERM GOAL #1   Title Pt will demonstrate understanding of visual compensatory strategies to improve safety and participation during ADLs    Time 3    Period Weeks    Status Achieved    Target Date 10/29/20      OT SHORT TERM GOAL #2   Title Pt will appropriately terminate task independently during therapeutic activity (e.g., tabletop activity, IADL task, etc)    Time 3    Period Weeks    Status Achieved             OT Long Term Goals - 10/19/20 1506       OT LONG TERM GOAL #1   Title Pt will demonstrate improved  processing speed for improved safety during cooking tasks as evidenced by decreasing Trail Making Test: Part A time by at least 10 sec    Baseline TMT: Part A in 1 min, 11 sec (2 errors)    Time 6    Period Weeks    Status Achieved   10/19/20 - TMT: Part A in 57 sec w/ 1 error     OT LONG TERM GOAL #2   Title Pt will demonstrate independence w/ HEP designed for UB control/coordination and FM skills    Baseline 9HPT in 37 sec w/ LUE; 29 sec w/ RUE    Time 6    Period Weeks    Status On-going      OT LONG TERM GOAL #3   Title Pt will safely complete simple, multi-step meal prep activity w/ less than 2 verbal cues by d/c    Time 6    Period Weeks    Status On-going      OT LONG TERM GOAL #4   Title Pt will demonstrate appropriate initiation/termination of light housekeeping tasks (e.g., unloading dishwasher, making the bed, cleaning countertop) w/ Mod I in at least 2 trials    Time 6    Period Weeks    Status On-going             Plan - 10/19/20 1530     Clinical Impression Statement OT introduced coordination activities for pt to include in HEP and carryover to home due to pt demo'ing improvements in FM/GM skills; pt returned demo of all activities w/ Mod I after initial modeling. OT then facilitated written sequencing tasks to assess processing and cognition during functional activities w/ pt demo'ing significant difficulty. Verbally discussed errors w/ pt and she was able to self-correct w/ Mod A; will continue to assess sequencing w/ ADLs to further determine impact of reception of written/verbal information and identify appropriate compensatory strategies prn.    OT Occupational Profile and History Detailed Assessment- Review of Records and additional review of physical, cognitive, psychosocial history related to current functional performance    Occupational performance deficits (Please refer to evaluation for details): ADL's;IADL's;Rest and Sleep;Leisure;Social Participation     Body Structure / Function / Physical Skills ADL;Decreased knowledge of precautions;GMC;UE functional use;Strength;Vision;Sensation;IADL;Balance;Endurance;FMC;Body mechanics;Coordination;Mobility    Cognitive Skills Attention;Memory;Safety Awareness;Problem Solve;Orientation;Perception    Rehab Potential Good    Clinical Decision Making Several treatment options, min-mod task modification necessary    Comorbidities Affecting Occupational Performance: May have comorbidities impacting occupational performance    Modification or Assistance to Complete Evaluation  Min-Moderate modification of tasks or assist with assess necessary  to complete eval    OT Frequency 1x / week    OT Duration 6 weeks    OT Treatment/Interventions Self-care/ADL training;Therapeutic exercise;Energy conservation;Therapist, nutritional;Therapeutic activities;Visual/perceptual remediation/compensation;Balance training;Psychosocial skills training;Patient/family education;Cognitive remediation/compensation;Manual Therapy;DME and/or AE instruction;Neuromuscular education    Plan Assess multi-step IADL (meal prep, simulated shopping, laundry sequencing)    Recommended Other Services Pt is currently receiving SLP and PT services at this clinic    Consulted and Agree with Plan of Care Patient             Patient will benefit from skilled therapeutic intervention in order to improve the following deficits and impairments:   Body Structure / Function / Physical Skills: ADL, Decreased knowledge of precautions, GMC, UE functional use, Strength, Vision, Sensation, IADL, Balance, Endurance, FMC, Body mechanics, Coordination, Mobility Cognitive Skills: Attention, Memory, Safety Awareness, Problem Solve, Orientation, Perception   Visit Diagnosis: Other lack of coordination  Visuospatial deficit  Attention and concentration deficit  Unspecified symptoms and signs involving cognitive functions following cerebral  infarction  Muscle weakness (generalized)    Problem List Patient Active Problem List   Diagnosis Date Noted   Acute ischemic cerebrovascular accident (CVA) involving anterior cerebral artery territory ([place]) 09/03/2020   Acute CVA (cerebrovascular accident) ([place]) 09/01/2020   Acute ischemic stroke ([place]) 08/31/2020   Aneurysm of anterior communicating artery 08/25/2020   Aneurysm, cerebral, nonruptured 08/25/2020   Microcytic anemia 10/26/2014   Vitamin D deficiency 10/17/2014   Stenosing tenosynovitis of thumb 10/17/2014   Internal hemorrhoids 10/17/2014   History of colon polyps 10/17/2014   Osteoarthritis of carpometacarpal joints of both thumbs 04/04/2014   Pre-diabetes 08/18/2013   History of TIA (transient ischemic attack) 08/12/2013   Routine health maintenance 08/15/2010   Hyperlipidemia 08/05/2008   Obesity 07/30/2007   TOBACCO DEPENDENCE 04/19/2006     Kathrine Cords, OTR/L, MSOT 10/20/2020, 2:38 PM  [place]. [place], [place], [address] Phone: [phone]   Fax:  [phone]  Name: CAIRO COCCARO MRN: [id number] Date of Birth: [birth year]
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
## 2020-10-21 ENCOUNTER — Other Ambulatory Visit: Payer: Self-pay

## 2020-10-21 ENCOUNTER — Telehealth: Payer: Self-pay

## 2020-10-21 ENCOUNTER — Ambulatory Visit: Payer: Self-pay | Admitting: Speech Pathology

## 2020-10-21 ENCOUNTER — Ambulatory Visit: Payer: Self-pay | Attending: Physical Medicine and Rehabilitation | Admitting: Physical Therapy

## 2020-10-21 ENCOUNTER — Encounter: Payer: Self-pay | Admitting: Speech Pathology

## 2020-10-21 ENCOUNTER — Encounter: Payer: Self-pay | Admitting: Physical Therapy

## 2020-10-21 DIAGNOSIS — I69354 Hemiplegia and hemiparesis following cerebral infarction affecting left non-dominant side: Secondary | ICD-10-CM | POA: Insufficient documentation

## 2020-10-21 DIAGNOSIS — M6281 Muscle weakness (generalized): Secondary | ICD-10-CM | POA: Insufficient documentation

## 2020-10-21 DIAGNOSIS — R2689 Other abnormalities of gait and mobility: Secondary | ICD-10-CM | POA: Insufficient documentation

## 2020-10-21 DIAGNOSIS — R41840 Attention and concentration deficit: Secondary | ICD-10-CM | POA: Insufficient documentation

## 2020-10-21 DIAGNOSIS — R41841 Cognitive communication deficit: Secondary | ICD-10-CM

## 2020-10-21 DIAGNOSIS — R278 Other lack of coordination: Secondary | ICD-10-CM | POA: Insufficient documentation

## 2020-10-21 DIAGNOSIS — M25672 Stiffness of left ankle, not elsewhere classified: Secondary | ICD-10-CM | POA: Insufficient documentation

## 2020-10-21 DIAGNOSIS — I69319 Unspecified symptoms and signs involving cognitive functions following cerebral infarction: Secondary | ICD-10-CM | POA: Insufficient documentation

## 2020-10-21 DIAGNOSIS — R2681 Unsteadiness on feet: Secondary | ICD-10-CM | POA: Insufficient documentation

## 2020-10-21 DIAGNOSIS — R41842 Visuospatial deficit: Secondary | ICD-10-CM | POA: Insufficient documentation

## 2020-10-21 MED ORDER — METHYLPHENIDATE HCL 10 MG PO TABS: 15.0000 mg | ORAL_TABLET | Freq: Two times a day (BID) | ORAL | 0 refills | Status: DC

## 2020-10-21 MED ORDER — TICAGRELOR 90 MG PO TABS
90.0000 mg | ORAL_TABLET | Freq: Two times a day (BID) | ORAL | 0 refills | Status: DC
  Filled 2020-10-21: qty 60, 30d supply, fill #0

## 2020-10-21 MED ORDER — ATORVASTATIN CALCIUM 80 MG PO TABS
80.0000 mg | ORAL_TABLET | Freq: Every day | ORAL | 0 refills | Status: DC
  Filled 2020-10-21: qty 30, 30d supply, fill #0

## 2020-10-21 NOTE — Telephone Encounter (Signed)
Patient son called starting patient is running out of medication and she doesn't have an appt with PCP until Tuesday Sept. 6. She needs a refill on Brilinta, Ritalin and Atorvastatin.

## 2020-10-21 NOTE — Therapy (Signed)
[place]. [place], [place], [address] Phone: [phone]   Fax:  [phone]  Speech Language Pathology Treatment  Patient Details  Name: Betty Archer MRN: [id number] Date of Birth: 12-19-1960 Referring Provider (SLP): Reesa Chew [place]   Encounter Date: 10/21/2020   End of Session - 10/21/20 1613     Visit Number 6    Number of Visits 17    Date for SLP Re-Evaluation 12/07/20    SLP Start Time [id number]    SLP Stop Time  1528    SLP Time Calculation (min) 43 min    Activity Tolerance Patient tolerated treatment well             Past Medical History:  Diagnosis Date   Hyperlipidemia     Past Surgical History:  Procedure Laterality Date   BREAST BIOPSY      Core biopsy done on April 2003   COLONOSCOPY     IR 3D INDEPENDENT WKST  08/25/2020   IR 3D INDEPENDENT WKST  08/25/2020   IR ANGIO INTRA EXTRACRAN SEL INTERNAL CAROTID BILAT MOD SED  08/25/2020   IR ANGIO VERTEBRAL SEL VERTEBRAL BILAT MOD SED  08/25/2020   IR CT HEAD LTD  08/25/2020   IR INTRA CRAN STENT  08/25/2020   IR TRANSCATH/EMBOLIZ  08/25/2020   IR US GUIDE VASC ACCESS RIGHT  08/25/2020   PARTIAL HYSTERECTOMY   10/22/1999    Still has cervix   RADIOLOGY WITH ANESTHESIA N/A 08/25/2020   Procedure: IR WITH ANESTHESIA EMBOLIZATION;  Surgeon: Pedro Earls, MD;  Location: [facility];  Service: Radiology;  Laterality: N/A;   TRIGGER FINGER RELEASE Left 12/25/2013   Procedure: LEFT THUMB TRIGGER RELEASE ;  Surgeon: Leanora Cover, MD;  Location: [place];  Service: Orthopedics;  Laterality: Left;    There were no vitals filed for this visit.   Subjective Assessment - 10/21/20 1448     Subjective Pt was willing to participate in today's tasks.    Currently in Pain? No/denies    Pain Score 0-No pain    Pain Onset In the past 7 days                   ADULT SLP TREATMENT - 10/21/20 1533       General Information    Behavior/Cognition Cooperative;Alert      Treatment Provided   Treatment provided Cognitive-Linquistic      Cognitive-Linquistic Treatment   Treatment focused on Cognition    Skilled Treatment Pt participated in a Blink! card game targeting memory and attention. The SLP taught 3 strategies for the game (match by color, shape, and number) and the pt accurately recalled game strategies 5 times independently throughout the session when asked. The pt completed a calendar activity, placing appointments and activities in their corresponding place using sustained attention for 30 minutes. She required modA to complete this task for accuracy.      Assessment / Recommendations / Plan   Plan Continue with current plan of care      Progression Toward Goals   Progression toward goals Progressing toward goals                SLP Short Term Goals - 10/19/20 1639       SLP SHORT TERM GOAL #1   Title Pt will recall orientation (time and situation) post 10 minutes using spaced retrieval therapy.    Time 4  Period Weeks    Status On-going    Target Date 11/07/20      SLP SHORT TERM GOAL #2   Title Demonstrate sustained attention by maintaining focus during a task for 30 minutes with <1 verbal cue in a quiet environment.    Time 4    Period Weeks    Status Revised    Target Date 11/07/20      SLP SHORT TERM GOAL #3   Title Pt will read 3 prescription labels and organize corresponding pills into pill box with supervision from SLP.    Time 4    Period Weeks    Status On-going    Target Date 11/07/20              SLP Long Term Goals - 10/14/20 1548       SLP LONG TERM GOAL #1   Title Patient will be appropriately oriented to person, place, time, and situation without cues to improve safety and awareness in functional living environment.    Time 8    Period Weeks    Status On-going      SLP LONG TERM GOAL #2   Title Patient will develop sustained attention skills to  effectively attend to and communicate in simple daily living tasks in functional living environment.    Time 8    Period Weeks    Status On-going      SLP LONG TERM GOAL #3   Title Pt will organize medication into pill box with minA from pt family.    Time 8    Period Weeks    Status On-going              Plan - 10/21/20 1614     Clinical Impression Statement See tx note. Pt was responsive to cueing. Initiation appears to be improving.    Speech Therapy Frequency 2x / week    Duration 8 weeks    Treatment/Interventions Functional tasks;Patient/family education;Cueing hierarchy;Environmental controls;Cognitive reorganization;Compensatory techniques;Internal/external aids;SLP instruction and feedback;Multimodal communcation approach    Potential to Achieve Goals Good    Consulted and Agree with Plan of Care Patient;Family member/caregiver    Family Member [facility] - son             Patient will benefit from skilled therapeutic intervention in order to improve the following deficits and impairments:   Cognitive communication deficit    Problem List Patient Active Problem List   Diagnosis Date Noted   Acute ischemic cerebrovascular accident (CVA) involving anterior cerebral artery territory ([place]) 09/03/2020   Acute CVA (cerebrovascular accident) ([place]) 09/01/2020   Acute ischemic stroke ([place]) 08/31/2020   Aneurysm of anterior communicating artery 08/25/2020   Aneurysm, cerebral, nonruptured 08/25/2020   Microcytic anemia 10/26/2014   Vitamin D deficiency 10/17/2014   Stenosing tenosynovitis of thumb 10/17/2014   Internal hemorrhoids 10/17/2014   History of colon polyps 10/17/2014   Osteoarthritis of carpometacarpal joints of both thumbs 04/04/2014   Pre-diabetes 08/18/2013   History of TIA (transient ischemic attack) 08/12/2013   Routine health maintenance 08/15/2010   Hyperlipidemia 08/05/2008   Obesity 07/30/2007   TOBACCO DEPENDENCE 04/19/2006     Rosann Auerbach [place] MS, [place], CBIS  10/21/2020, 4:15 PM  [place] [place], [place], [address] Phone: [phone]   Fax:  [phone]   Name: SHIRL FINNICUM MRN: [id number] Date of Birth: 04/14/1960

## 2020-10-21 NOTE — Telephone Encounter (Signed)
Betty Archer, has a PCP appointment on 10/26/2020.  PMP was Reviewed. Ritalin e-scribed today.  Brilinta and Atorvastatin filled today. Placed a call to Betty Archer son [place], regarding the above. He verbalizes understanding.

## 2020-10-22 ENCOUNTER — Other Ambulatory Visit: Payer: Self-pay

## 2020-10-22 NOTE — Progress Notes (Signed)
Subjective:    Betty Archer - 60 y.o. female MRN [id number]  Date of birth: Apr 03, 1960  HPI  Betty Archer is to establish care and hospital discharge follow-up. She is accompanied by her son who waits in the lobby.    Current issues and/or concerns: Visit 09/03/2020 - 09/24/2020 at [facility] per MD note:  Admit date: 09/03/2020 Discharge date: 09/24/2020   Discharge Diagnoses:  Principal Problem:   Acute ischemic cerebrovascular accident (CVA) involving anterior cerebral artery territory [facility]) Pain management Hyperlipidemia Hypertension History of tobacco use Constipation  Brief HPI:   Betty Archer is a 60 y.o. right-handed female with history of hypertension hyperlipidemia tobacco use as well as a 6 mm right A2/ACA aneurysm and an 8 mm left MCA bifurcation aneurysm identified after patient with complaints of headache and right arm numbness in June 2022 followed by neurology services underwent diagnostic cerebral angiogram and right aneurysmal embolization 08/25/2020 per interventional radiology and left MCA aneurysm unrepaired with course complicated by right groin hematoma that required prolonged initial intubation was discharged home 08/27/2020 maintained on aspirin as well as Brilinta.  Per chart review lives with her son.  Presented 08/31/2020 with altered mental status and lower extremity weakness.  Cranial CT scan negative for acute changes.  CT angiogram of head and neck no emergent large vessel occlusion or high-grade stenosis.  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  Coil mass at the anterior communicating artery without visible residual aneurysmal filling.  MRI of the brain showed confluent right acute ACA territory infarction including involvement of the anterior basal ganglia and ischemia also in the left ACA territory with no associated hemorrhage or mass-effect.  Echocardiogram with ejection fraction of 65 to [id number] grade 2 diastolic dysfunction no regional wall  motion abnormalities.  Neurology follow-up maintained on aspirin as well as Brilinta for CVA prophylaxis.  Therapy evaluations completed due to patient decreased functional mobility was admitted for a comprehensive rehab program.     Hospital Course: Betty Archer was admitted to rehab 09/03/2020 for inpatient therapies to consist of PT, ST and OT at least three hours five days a week. Past admission physiatrist, therapy team and rehab RN have worked together to provide customized collaborative inpatient rehab.  Pertaining to patient's large right ACA and small left ACA infarction likely related to recent ACOEM aneurysm post coiling and stenting remained on aspirin as well as Brilinta.  Patient would follow neurology services.  Ritalin was added to patient's regimen to help sustain attention and focus to task.  SCDs for DVT prophylaxis.  Pain managed with Lidoderm patch as directed.  Hyperlipidemia with Lipitor ongoing.     Rehab course: During patient's stay in rehab weekly team conferences were held to monitor patient's progress, set goals and discuss barriers to discharge. At admission, patient required moderate assist supine to sit mod assist sit to stand max assist upper body bathing total assist lower body bathing    Discharged Condition: Stable   Disposition: Discharged to home    Diet: Regular   Special Instructions: No driving smoking or alcohol   Medications at discharge. 1.  Tylenol as needed 2.  Lidoderm patch as directed 3.  Ritalin 50 mg p.o. twice daily 4.  MiraLAX as needed 5.  Brilinta 90 mg p.o. twice daily 6.  Aspirin 81 mg p.o. daily 7.  Lipitor 80 mg p.o. nightly  Follow-Ups Schedule an appointment with [facility] (Neurology) in 1 month (10/24/2020); stroke clinic Follow  up with [name], MD (Physical Medicine and Rehabilitation); Office to call for appointment Follow up with Gypsy Balsam, NP; See below--you have to keep your Sept 6th  appointment so you can have a PCP for follow up and medication refills  TODAY'S VISIT 10/26/2020: Reports she is doing well. Speech and strength normal, denies any residuals from stroke. Not able to tell anything about activities of daily living. Does not intake alcohol or tobacco. Taking Brilinta, Aspirin, Ritalin, Lipitor, and Tylenol. Doing well in Rehab. Was not aware of Neurology appointments. Has intermittent left foot swelling has been elevating when able to do so.    ROS per HPI   Health Maintenance:  Health Maintenance Due  Topic Date Due   COVID-19 Vaccine (1) Never done   Zoster Vaccines- Shingrix (1 of 2) Never done   Pneumococcal Vaccine 10-37 Years old (2 - PCV) 08/14/2014   MAMMOGRAM  11/12/2016   INFLUENZA VACCINE  09/20/2020   COLONOSCOPY (Pts 45-12yr Insurance coverage will need to be confirmed)  10/09/2020     Past Medical History: Patient Active Problem List   Diagnosis Date Noted   Acute ischemic cerebrovascular accident (CVA) involving anterior cerebral artery territory ([place] 09/03/2020   Acute CVA (cerebrovascular accident) ([place] 09/01/2020   Acute ischemic stroke ([place] 08/31/2020   Aneurysm of anterior communicating artery 08/25/2020   Aneurysm, cerebral, nonruptured 08/25/2020   Microcytic anemia 10/26/2014   Vitamin D deficiency 10/17/2014   Stenosing tenosynovitis of thumb 10/17/2014   Internal hemorrhoids 10/17/2014   History of colon polyps 10/17/2014   Osteoarthritis of carpometacarpal joints of both thumbs 04/04/2014   Pre-diabetes 08/18/2013   History of TIA (transient ischemic attack) 08/12/2013   Routine health maintenance 08/15/2010   Hyperlipidemia 08/05/2008   Obesity 07/30/2007   TOBACCO DEPENDENCE 04/19/2006     Social History   reports that she has been smoking cigarettes. She has a 13.50 pack-year smoking history. She has never used smokeless tobacco. She reports current alcohol use. She reports that she does not use drugs.   Family  History  family history includes Breast cancer in her maternal aunt and maternal grandmother; Hypertension in her mother; Lung cancer in her father.   Medications: reviewed and updated   Objective:   Physical Exam BP 110/68 (BP Location: Left Arm, Patient Position: Sitting, Cuff Size: Normal)   Pulse 96   Temp 97.7 F (36.5 C)   Resp 16   Ht 5' 5.98" (1.676 m)   Wt 185 lb 3.2 oz (84 kg)   SpO2 99%   BMI 29.91 kg/m   Physical Exam HENT:     Head: Normocephalic and atraumatic.     Nose: Nose normal.     Mouth/Throat:     Mouth: Mucous membranes are moist.     Pharynx: Oropharynx is clear.  Eyes:     Extraocular Movements: Extraocular movements intact.     Conjunctiva/sclera: Conjunctivae normal.     Pupils: Pupils are equal, round, and reactive to light.  Cardiovascular:     Rate and Rhythm: Normal rate and regular rhythm.     Pulses: Normal pulses.     Heart sounds: Normal heart sounds. No murmur heard. Pulmonary:     Effort: Pulmonary effort is normal.     Breath sounds: Normal breath sounds.  Musculoskeletal:     Cervical back: Normal range of motion and neck supple.     Right foot: Swelling present.     Left foot: Swelling present.  Comments: +1 edema bilateral feet.   Neurological:     General: No focal deficit present.     Mental Status: She is alert and oriented to person, place, and time.     Comments: Follows commands. Moves right upper extremity right lower extremity spontaneously. 4/5 left upper extremity and 4/5 left lower extremity.     Psychiatric:        Mood and Affect: Mood normal.        Behavior: Behavior normal.      Assessment & Plan:  1. Encounter to establish care: - Patient presents today to establish care.  - Return for annual physical examination, labs, and health maintenance. Arrive fasting meaning having no food for at least 8 hours prior to appointment. You may have only water or black coffee. Please take scheduled medications as  normal.  2. Hospital discharge follow-up: - Reviewed hospital course, current medications, ensured proper follow-up in place, and addressed concerns.   3. Acute ischemic cerebrovascular accident (CVA) involving anterior cerebral artery territory [facility]): - Keep all appointments as scheduled with Neurology.  - Keep all appointments as scheduled with Physical Medicine.   4. Hyperlipidemia, unspecified hyperlipidemia type: -Practice low-fat heart healthy diet and at least 150 minutes of moderate intensity exercise weekly as tolerated.  - Continue Atorvastatin as prescribed.  - Follow-up with primary provider as scheduled.  5. Essential hypertension: - Controlled without hypertension medications.  - Follow-up with primary provider in 3 months or sooner if needed.   6. History of tobacco use: - Patient reports no longer using tobacco.   7. Edema of both feet: - No evidence of red flag symptoms present.  - Vascular Ultrasound Lower Extremities negative, 10/01/2020. - Keep bilateral legs raised above (elevated) above the level of your heart when you are sitting or lying down. - Do not sit or stand for a long time.  - Exercise your legs. This may help swelling go down.  - Wear support stockings as tolerated. - Follow-up with primary provider as scheduled.     Patient was given clear instructions to go to Emergency Department or return to medical center if symptoms don't improve, worsen, or new problems develop.The patient verbalized understanding.  I discussed the assessment and treatment plan with the patient. The patient was provided an opportunity to ask questions and all were answered. The patient agreed with the plan and demonstrated an understanding of the instructions.   The patient was advised to call back or seek an in-person evaluation if the symptoms worsen or if the condition fails to improve as anticipated.    Durene Fruits, NP 10/26/2020, 10:01 AM Primary Care at [facility]

## 2020-10-26 ENCOUNTER — Other Ambulatory Visit: Payer: Self-pay

## 2020-10-26 ENCOUNTER — Ambulatory Visit: Payer: Self-pay | Admitting: Occupational Therapy

## 2020-10-26 ENCOUNTER — Ambulatory Visit (INDEPENDENT_AMBULATORY_CARE_PROVIDER_SITE_OTHER): Payer: Self-pay | Admitting: Family

## 2020-10-26 ENCOUNTER — Encounter: Payer: Self-pay | Admitting: Physical Therapy

## 2020-10-26 ENCOUNTER — Other Ambulatory Visit (HOSPITAL_COMMUNITY): Payer: Self-pay | Admitting: Neuroradiology

## 2020-10-26 ENCOUNTER — Encounter: Payer: Self-pay | Admitting: Occupational Therapy

## 2020-10-26 ENCOUNTER — Ambulatory Visit: Payer: Self-pay | Admitting: Physical Therapy

## 2020-10-26 ENCOUNTER — Ambulatory Visit: Payer: Self-pay | Admitting: Speech Pathology

## 2020-10-26 ENCOUNTER — Encounter: Payer: Self-pay | Admitting: Family

## 2020-10-26 VITALS — BP 110/68 | HR 96 | Temp 97.7°F | Resp 16 | Ht 65.98 in | Wt 185.2 lb

## 2020-10-26 DIAGNOSIS — Z23 Encounter for immunization: Secondary | ICD-10-CM

## 2020-10-26 DIAGNOSIS — M25672 Stiffness of left ankle, not elsewhere classified: Secondary | ICD-10-CM

## 2020-10-26 DIAGNOSIS — E785 Hyperlipidemia, unspecified: Secondary | ICD-10-CM

## 2020-10-26 DIAGNOSIS — K5900 Constipation, unspecified: Secondary | ICD-10-CM

## 2020-10-26 DIAGNOSIS — Z09 Encounter for follow-up examination after completed treatment for conditions other than malignant neoplasm: Secondary | ICD-10-CM

## 2020-10-26 DIAGNOSIS — R2689 Other abnormalities of gait and mobility: Secondary | ICD-10-CM

## 2020-10-26 DIAGNOSIS — R52 Pain, unspecified: Secondary | ICD-10-CM

## 2020-10-26 DIAGNOSIS — I10 Essential (primary) hypertension: Secondary | ICD-10-CM

## 2020-10-26 DIAGNOSIS — Z87891 Personal history of nicotine dependence: Secondary | ICD-10-CM

## 2020-10-26 DIAGNOSIS — R41840 Attention and concentration deficit: Secondary | ICD-10-CM

## 2020-10-26 DIAGNOSIS — I69354 Hemiplegia and hemiparesis following cerebral infarction affecting left non-dominant side: Secondary | ICD-10-CM

## 2020-10-26 DIAGNOSIS — R278 Other lack of coordination: Secondary | ICD-10-CM

## 2020-10-26 DIAGNOSIS — I69319 Unspecified symptoms and signs involving cognitive functions following cerebral infarction: Secondary | ICD-10-CM

## 2020-10-26 DIAGNOSIS — R2681 Unsteadiness on feet: Secondary | ICD-10-CM

## 2020-10-26 DIAGNOSIS — M6281 Muscle weakness (generalized): Secondary | ICD-10-CM

## 2020-10-26 DIAGNOSIS — R41842 Visuospatial deficit: Secondary | ICD-10-CM

## 2020-10-26 DIAGNOSIS — I671 Cerebral aneurysm, nonruptured: Secondary | ICD-10-CM

## 2020-10-26 DIAGNOSIS — R41841 Cognitive communication deficit: Secondary | ICD-10-CM

## 2020-10-26 DIAGNOSIS — Z7689 Persons encountering health services in other specified circumstances: Secondary | ICD-10-CM

## 2020-10-26 DIAGNOSIS — I63529 Cerebral infarction due to unspecified occlusion or stenosis of unspecified anterior cerebral artery: Secondary | ICD-10-CM

## 2020-10-26 DIAGNOSIS — R600 Localized edema: Secondary | ICD-10-CM

## 2020-10-26 NOTE — Patient Instructions (Signed)
Rehabilitation After a Stroke, Adult Rehabilitation after a stroke is important for gaining your independence. It improves your strength and your ability to walk, speak, and swallow. The greatest improvements come with early rehabilitation after a stroke. What is stroke rehabilitation? There are different kinds of strokes, and they affect people differently. You may need intense inpatient rehabilitation or rehabilitation at a skilled nursing facility, or you may receive rehabilitation at home. Rehabilitation may occur over several months. Rehabilitation teams are often led by a physician with specialized training in rehabilitation after a stroke. This may be a neurologist or a physiatrist. They help to prevent medical complications of strokes by: Preventing contractures. This is when muscles and tendons tighten and cause joints to become stiff. Medicines or injections can help to prevent this. Monitoring to prevent and treat pressure wounds, also called bed sores, and deep vein thrombosis. Giving medicines to prevent future strokes and to treat pain and the inability to control when you urinate or have bowel movements (incontinence). Treating post-stroke depression. Other rehabilitation team members may include: Psychologists. Social workers. Nurses. Types of rehabilitation Physical therapy Physical therapists help you improve your coordination, balance, and muscle strength. Physical therapy may involve: Range-of-motion exercises. Exercises that strengthen muscles used for standing, walking, and other activities. Moving between lying, sitting, and standing positions. Walking with a cane or walker, if needed. Using stairs.  Occupational therapy Occupational therapists help you rebuild your skills to do everyday tasks, such as brushing your teeth, going to the bathroom, eating, bathing, and getting dressed. Occupational therapy may also help with: Vision. Visual scanning is a technique that is  used to prevent falls. Memory and cognitive training. This therapy includes problem-solving skills and relearning some tasks, such as making a phone call. Fine muscle movements, such as buttoning a shirt or picking up small objects. Speech-language therapy Speech-language pathologists help you communicate. After a stroke, you may have problems understanding what people are saying, or you may have trouble writing, speaking, or finding the right word for what you want to say. You may also need speech therapy if you have trouble swallowing while eating and drinking. Examples of speech-language therapies include: Techniques to strengthen muscles used in swallowing. Naming objects or describing pictures. This helps retrain the brain to recognize and remember words. Exercises to strengthen the muscles involved in talking, including your tongue and lips. Exercises to retrain your brain in understanding what you read and hear. General information  Do not use any products that contain nicotine or tobacco. These include cigarettes, chewing tobacco, and vaping devices, such as e-cigarettes. If you need help quitting, ask your health care provider. Take over-the-counter and prescription medicines only as told by your health care provider. Involve your family and friends in your recovery, if possible. Having another person to encourage you is helpful. Maintain social connections and interactions with friends, family, and community groups. This is an important part of your recovery because communication and physical challenges may cause you to feel isolated after a stroke. Do exercises as told by your health care provider. Keep all follow-up visits. This is important. Summary Stroke rehabilitation includes a variety of treatments and therapies to help you recover and gain your independence after a stroke. Rehabilitation will start as soon as you are able to participate. It includes care from a team of  experts. Rehabilitation may include physical, occupational, and speech-language therapies. This information is not intended to replace advice given to you by your health care provider. Make sure you  discuss any questions you have with your health care provider. Document Revised: 12/08/2019 Document Reviewed: 12/08/2019 Elsevier Patient Education  2022 [employer].

## 2020-10-26 NOTE — Addendum Note (Signed)
Addended by: Elmon Else on: 10/26/2020 04:59 PM   Modules accepted: Orders

## 2020-10-26 NOTE — Therapy (Signed)
[place]. [place], [place], [address] Phone: [phone]   Fax:  [phone]  Speech Language Pathology Treatment  Patient Details  Name: Betty Archer MRN: [id number] Date of Birth: 1960-05-15 Referring Provider (SLP): Reesa Chew [place]   Encounter Date: 10/26/2020   End of Session - 10/26/20 1603     Visit Number 7    Number of Visits 17    Date for SLP Re-Evaluation 12/07/20    SLP Start Time 1400    SLP Stop Time  [id number]    SLP Time Calculation (min) 45 min    Activity Tolerance Patient tolerated treatment well             Past Medical History:  Diagnosis Date   Hyperlipidemia     Past Surgical History:  Procedure Laterality Date   BREAST BIOPSY      Core biopsy done on April 2003   COLONOSCOPY     IR 3D INDEPENDENT WKST  08/25/2020   IR 3D INDEPENDENT WKST  08/25/2020   IR ANGIO INTRA EXTRACRAN SEL INTERNAL CAROTID BILAT MOD SED  08/25/2020   IR ANGIO VERTEBRAL SEL VERTEBRAL BILAT MOD SED  08/25/2020   IR CT HEAD LTD  08/25/2020   IR INTRA CRAN STENT  08/25/2020   IR TRANSCATH/EMBOLIZ  08/25/2020   IR US GUIDE VASC ACCESS RIGHT  08/25/2020   PARTIAL HYSTERECTOMY   10/22/1999    Still has cervix   RADIOLOGY WITH ANESTHESIA N/A 08/25/2020   Procedure: IR WITH ANESTHESIA EMBOLIZATION;  Surgeon: Pedro Earls, MD;  Location: [place];  Service: Radiology;  Laterality: N/A;   TRIGGER FINGER RELEASE Left 12/25/2013   Procedure: LEFT THUMB TRIGGER RELEASE ;  Surgeon: Leanora Cover, MD;  Location: [place];  Service: Orthopedics;  Laterality: Left;    There were no vitals filed for this visit.   Subjective Assessment - 10/26/20 1600     Subjective Pt reports no new concerns.    Currently in Pain? No/denies                   ADULT SLP TREATMENT - 10/26/20 1528       General Information   Behavior/Cognition Cooperative;Alert      Treatment Provided   Treatment provided  Cognitive-Linquistic      Cognitive-Linquistic Treatment   Treatment focused on Cognition    Skilled Treatment Pt participated in a medication management task to practice organizational skills to increase pt independence in iADLs. Pt was instructed to examine pill boxes to identify errors. Pt was independent with "easy" level med task (two pills). Pt required modA to complete task at the "moderate" level (3-4 pills). Pt continues to exhibit decreased awareness of deficits. Pt goal is to increase thinking skills in order to return to work.      Assessment / Recommendations / Plan   Plan Continue with current plan of care      Progression Toward Goals   Progression toward goals Progressing toward goals                SLP Short Term Goals - 10/19/20 1639       SLP SHORT TERM GOAL #1   Title Pt will recall orientation (time and situation) post 10 minutes using spaced retrieval therapy.    Time 4    Period Weeks    Status On-going    Target Date 11/07/20  SLP SHORT TERM GOAL #2   Title Demonstrate sustained attention by maintaining focus during a task for 30 minutes with <1 verbal cue in a quiet environment.    Time 4    Period Weeks    Status Revised    Target Date 11/07/20      SLP SHORT TERM GOAL #3   Title Pt will read 3 prescription labels and organize corresponding pills into pill box with supervision from SLP.    Time 4    Period Weeks    Status On-going    Target Date 11/07/20              SLP Long Term Goals - 10/14/20 1548       SLP LONG TERM GOAL #1   Title Patient will be appropriately oriented to person, place, time, and situation without cues to improve safety and awareness in functional living environment.    Time 8    Period Weeks    Status On-going      SLP LONG TERM GOAL #2   Title Patient will develop sustained attention skills to effectively attend to and communicate in simple daily living tasks in functional living environment.    Time  8    Period Weeks    Status On-going      SLP LONG TERM GOAL #3   Title Pt will organize medication into pill box with minA from pt family.    Time 8    Period Weeks    Status On-going              Plan - 10/26/20 1557     Clinical Impression Statement See tx note. Pt was responsive to cueing in today's task. SLP noted a reduction in pt processing time to answer questions. Cont with current POC.    Speech Therapy Frequency 2x / week    Duration 8 weeks    Treatment/Interventions Functional tasks;Patient/family education;Cueing hierarchy;Environmental controls;Cognitive reorganization;Compensatory techniques;Internal/external aids;SLP instruction and feedback;Multimodal communcation approach    Potential to Achieve Goals Good    Consulted and Agree with Plan of Care Patient;Family member/caregiver    Family Member [facility] - son             Patient will benefit from skilled therapeutic intervention in order to improve the following deficits and impairments:   Cognitive communication deficit    Problem List Patient Active Problem List   Diagnosis Date Noted   Acute ischemic cerebrovascular accident (CVA) involving anterior cerebral artery territory ([place]) 09/03/2020   Acute CVA (cerebrovascular accident) ([place]) 09/01/2020   Acute ischemic stroke ([place]) 08/31/2020   Aneurysm of anterior communicating artery 08/25/2020   Aneurysm, cerebral, nonruptured 08/25/2020   Microcytic anemia 10/26/2014   Vitamin D deficiency 10/17/2014   Stenosing tenosynovitis of thumb 10/17/2014   Internal hemorrhoids 10/17/2014   History of colon polyps 10/17/2014   Osteoarthritis of carpometacarpal joints of both thumbs 04/04/2014   Pre-diabetes 08/18/2013   History of TIA (transient ischemic attack) 08/12/2013   Routine health maintenance 08/15/2010   Hyperlipidemia 08/05/2008   Obesity 07/30/2007   TOBACCO DEPENDENCE 04/19/2006    Danise Mina B.S. Communication Sciences and  Disorders  10/26/2020, 4:05 PM  [place]. [place], [place], [address] Phone: [phone]   Fax:  [phone]   Name: ALISYN TACURI MRN: [id number] Date of Birth: [birth year]

## 2020-10-26 NOTE — Therapy (Signed)
[facility]. [place], [place], [address] Phone: [phone]   Fax:  [phone]  Physical Therapy Treatment  Patient Details  Name: Betty Archer MRN: [id number] Date of Birth: 05/18/1960 Referring Provider (PT): Bary Leriche, [place]   Encounter Date: 10/26/2020   PT End of Session - 10/26/20 1531     Visit Number 4    Number of Visits 17    Date for PT Re-Evaluation 12/03/20    Authorization Type Self Pay    PT Start Time [id number]    PT Stop Time [id number]    PT Time Calculation (min) 46 min    Equipment Utilized During Treatment Gait belt    Activity Tolerance Patient tolerated treatment well    Behavior During Therapy [facility] for tasks assessed/performed;Flat affect             Past Medical History:  Diagnosis Date   Hyperlipidemia     Past Surgical History:  Procedure Laterality Date   BREAST BIOPSY      Core biopsy done on April 2003   COLONOSCOPY     IR 3D INDEPENDENT WKST  08/25/2020   IR 3D INDEPENDENT WKST  08/25/2020   IR ANGIO INTRA EXTRACRAN SEL INTERNAL CAROTID BILAT MOD SED  08/25/2020   IR ANGIO VERTEBRAL SEL VERTEBRAL BILAT MOD SED  08/25/2020   IR CT HEAD LTD  08/25/2020   IR INTRA CRAN STENT  08/25/2020   IR TRANSCATH/EMBOLIZ  08/25/2020   IR US GUIDE VASC ACCESS RIGHT  08/25/2020   PARTIAL HYSTERECTOMY   10/22/1999    Still has cervix   RADIOLOGY WITH ANESTHESIA N/A 08/25/2020   Procedure: IR WITH ANESTHESIA EMBOLIZATION;  Surgeon: Pedro Earls, MD;  Location: [place];  Service: Radiology;  Laterality: N/A;   TRIGGER FINGER RELEASE Left 12/25/2013   Procedure: LEFT THUMB TRIGGER RELEASE ;  Surgeon: Leanora Cover, MD;  Location: [place];  Service: Orthopedics;  Laterality: Left;    There were no vitals filed for this visit.   Subjective Assessment - 10/26/20 1314     Subjective Had an appointment with her MD earlier today which went well. Reports compliance with HEP.     Pertinent History HLD, repair of right ACA aneurysm, left MCA aneurysm (unrepaired)    Diagnostic tests 09/01/20 brain MRI: Acute Right ACA territory infarct, Ischemia also in the Left ACA territory    Patient Stated Goals patient unable to verbalize goals for therapy but agreeable to work on strengthening    Currently in Pain? No/denies                               [facility] Adult PT Treatment/Exercise - 10/26/20 0001       Ambulation/Gait   Ambulation Distance (Feet) 220 Feet    Assistive device Straight cane    Gait Pattern Step-through pattern;Decreased arm swing - right;Decreased arm swing - left;Poor foot clearance - left;Poor foot clearance - right;Trunk flexed    Ambulation Surface Level;Indoor    Gait velocity slightly decreased    Gait Comments gait training with SPC with cueing for proper AD sequencing; pt intermittently losing proper sequence and requiring correction      Neuro Re-ed    Neuro Re-ed Details  R/L forward/back stepping with foot on foam 10x each with CGA/min A; R/L staggered stance + torso twist 10x each; anterior/posterior tandem walk with  CGA 4x2f; kicking beachball & smaller blue ball back in forth in II bars- consistent cueing to avoid using B UE support      Knee/Hip Exercises: Aerobic   Recumbent Bike level 2 x 6 minutes      Knee/Hip Exercises: Standing   Knee Flexion Strengthening;Left;2 sets;10 reps    Knee Flexion Limitations HS curl yellow loop in II bars    Forward Step Up Left;1 set;10 reps;Hand Hold: 1;Step Height: 6"    Forward Step Up Limitations L step up/over 10x, L step downs withheel touch 10x   in II bars     Knee/Hip Exercises: Seated   Sit to Sand 1 set;10 reps;without UE support   foam                   PT Education - 10/26/20 1531     Education Details update to HEP; advised to wear tennis shoes next session for max safety    Person(s) Educated Patient    Methods Explanation;Demonstration;Tactile  cues;Verbal cues;Handout    Comprehension Verbalized understanding;Returned demonstration              PT Short Term Goals - 10/26/20 1533       PT SHORT TERM GOAL #1   Title Patient to be mod I/independent with initial HEP.    Status Achieved               PT Long Term Goals - 10/19/20 1539       PT LONG TERM GOAL #1   Title Patient to be mod I/independent with advanced HEP.    Time 8    Period Weeks    Status On-going      PT LONG TERM GOAL #2   Title Patient to demonstrate B LE strength >/=4+/5.    Time 8    Period Weeks    Status On-going      PT LONG TERM GOAL #3   Title Patient to demonstrate alternating reciprocal pattern when ascending and descending stairs with good stability and 1 handrail as needed.    Time 8    Period Weeks    Status On-going      PT LONG TERM GOAL #4   Title Patient to complete TUG in <14 sec with LRAD in order to decrease risk of falls.    Time 8    Period Weeks    Status On-going      PT LONG TERM GOAL #5   Title Patient to demonstrate atleast -10 degrees of L ankle dorsiflexion AROM for improved gait pattern.    Time 8    Period Weeks    Status On-going                   Plan - 10/26/20 1532     Clinical Impression Statement Patient arrived to session without new complaints. [facility] gait [facility], providing cueing to maintain proper AD sequence. Cues also provided to re-direct patient's attention during balance activities d/t patient's tendency to lose focus today. Most imbalance today was demonstrated with stepping on foam and with tandem walk. Updated tandem walking into HEP as patient was able to safely perform it today. Patient was also advised to wear more supportive footwear in future sessions to maintain max safety- patient reported understanding. Remainder of session focused on dynamic balance activities, encouraging patient to avoid reaching strategy and unnecessary  UE use. No complaints at end of session. Patient  is progressing well towards goals.    Personal Factors and Comorbidities Age;Comorbidity 3+;Time since onset of injury/illness/exacerbation;Fitness;Transportation    Comorbidities HLD, repair of right ACA aneurysm, left MCA aneurysm (unrepaired)    PT Treatment/Interventions ADLs/Self Care Home Management;Canalith Repostioning;Cryotherapy;Electrical Stimulation;DME Instruction;Ultrasound;Moist Heat;Gait training;Stair training;Functional mobility training;Therapeutic activities;Therapeutic exercise;Balance training;Neuromuscular re-education;Manual techniques;Patient/family education;Passive range of motion;Dry needling;Energy conservation;Vestibular;Taping    PT Next Visit Plan continue to work on strength, balance and gait    Consulted and Agree with Plan of Care Patient             Patient will benefit from skilled therapeutic intervention in order to improve the following deficits and impairments:  Abnormal gait, Decreased coordination, Decreased range of motion, Difficulty walking, Increased fascial restricitons, Dizziness, Decreased activity tolerance, Decreased safety awareness, Pain, Decreased balance, Impaired flexibility, Improper body mechanics, Postural dysfunction, Increased edema, Decreased strength, Decreased mobility  Visit Diagnosis: Hemiplegia and hemiparesis following cerebral infarction affecting left non-dominant side (HCC)  Muscle weakness (generalized)  Stiffness of left ankle, not elsewhere classified  Other abnormalities of gait and mobility  Unsteadiness on feet     Problem List Patient Active Problem List   Diagnosis Date Noted   Acute ischemic cerebrovascular accident (CVA) involving anterior cerebral artery territory ([place]) 09/03/2020   Acute CVA (cerebrovascular accident) ([place]) 09/01/2020   Acute ischemic stroke ([place]) 08/31/2020   Aneurysm of anterior communicating artery 08/25/2020   Aneurysm, cerebral,  nonruptured 08/25/2020   Microcytic anemia 10/26/2014   Vitamin D deficiency 10/17/2014   Stenosing tenosynovitis of thumb 10/17/2014   Internal hemorrhoids 10/17/2014   History of colon polyps 10/17/2014   Osteoarthritis of carpometacarpal joints of both thumbs 04/04/2014   Pre-diabetes 08/18/2013   History of TIA (transient ischemic attack) 08/12/2013   Routine health maintenance 08/15/2010   Hyperlipidemia 08/05/2008   Obesity 07/30/2007   TOBACCO DEPENDENCE 04/19/2006     Janene Harvey, PT, DPT 10/26/20 3:36 PM    [facility]. [place], [place], [address] Phone: [phone]   Fax:  [phone]  Name: Betty Archer MRN: [id number] Date of Birth: 18-May-1960

## 2020-10-26 NOTE — Progress Notes (Addendum)
Pt presents to establish care and hospital follow-up, wants flu vaccine

## 2020-10-27 NOTE — Therapy (Signed)
[facility]. [place], [place], [address] Phone: [phone]   Fax:  [phone]  Occupational Therapy Treatment & Discharge Summary  Patient Details  Name: Betty Archer MRN: [id number] Date of Birth: 03/18/1960 Referring Provider (OT): Reesa Chew, [place]   Encounter Date: 10/26/2020   OT End of Session - 10/26/20 1448     Visit Number 4    Date for OT Re-Evaluation 01/04/21    Authorization Type Self-pay    OT Start Time 9735    OT Stop Time 1520    OT Time Calculation (min) 35 min    Activity Tolerance Patient tolerated treatment well    Behavior During Therapy [facility] for tasks assessed/performed            OCCUPATIONAL THERAPY DISCHARGE SUMMARY  Visits from Start of Care: 4  Current functional level related to goals / functional outcomes: Pt is able to appropriately initiate and terminate tasks and activities independently, occasionally requiring extended time; improved Trail Making Test: Part A by 14 sec w/ decreased number of errors; and completed simple meal prep activity w/ 1 verbal cue for safety   Remaining deficits: Deficits w/ executive functioning (planning, initiating, organizing, connecting information/sequencing, prioritizing, recall) and safety awareness; attention deficits; decreased balance   Education / Equipment: Visual compensatory strategies   Patient agrees to discharge. Patient goals were met. Patient is being discharged due to meeting the stated rehab goals.    Past Medical History:  Diagnosis Date   Hyperlipidemia     Past Surgical History:  Procedure Laterality Date   BREAST BIOPSY      Core biopsy done on April 2003   COLONOSCOPY     IR 3D INDEPENDENT WKST  08/25/2020   IR 3D INDEPENDENT WKST  08/25/2020   IR ANGIO INTRA EXTRACRAN SEL INTERNAL CAROTID BILAT MOD SED  08/25/2020   IR ANGIO VERTEBRAL SEL VERTEBRAL BILAT MOD SED  08/25/2020   IR CT HEAD LTD  08/25/2020   IR INTRA  CRAN STENT  08/25/2020   IR TRANSCATH/EMBOLIZ  08/25/2020   IR US GUIDE VASC ACCESS RIGHT  08/25/2020   PARTIAL HYSTERECTOMY   10/22/1999    Still has cervix   RADIOLOGY WITH ANESTHESIA N/A 08/25/2020   Procedure: IR WITH ANESTHESIA EMBOLIZATION;  Surgeon: Pedro Earls, MD;  Location: [place];  Service: Radiology;  Laterality: N/A;   TRIGGER FINGER RELEASE Left 12/25/2013   Procedure: LEFT THUMB TRIGGER RELEASE ;  Surgeon: Leanora Cover, MD;  Location: [place];  Service: Orthopedics;  Laterality: Left;    There were no vitals filed for this visit.   Subjective Assessment - 10/26/20 1448     Subjective  "I can't think of anything else I want to work on"    Patient is accompanied by: Family member   son (was not in treatment room during evaluation session)   Pertinent History Recent hx: 6 mm R A2/ACA aneurysm and 8 mm L MCA bifurcation aneurysm; PMH includes HTN, HLD, and tobacco use    Limitations Cognitive deficits (orientation, awareness, attention, safety/judgement, self-monitoring, problem-solving)    Patient Stated Goals Difficult to obtain due to cognitive deficits    Currently in Pain? No/denies             Treatment/Exercises - 10/26/20    IADLs: Meal Prep  Simple meal prep activity completed w/ 3-step recipe involving 6 ingredients to address initiation, planning/organizing, recall, balance and functional mobility, self-monitoring,  and safety awareness during functional activities. Pt able to complete activity w/ SPV, demo'ing good initiation, recall, functional mobility, and sequencing; pt independently removed fork before placing mug in microwave and reached for handle opposed to mug when removing from microwave. OT provided assist w/ measuring cup abbreviations/sizing, extended time for processing, and discussed further recs for pt when attempting simple meal/snack prep at home, reiterating importance of completing kitchen tasks w/ SPV for safety at this  time.         OT Short Term Goals - 10/12/20 1452       OT SHORT TERM GOAL #1   Title Pt will demonstrate understanding of visual compensatory strategies to improve safety and participation during ADLs    Time 3    Period Weeks    Status Achieved    Target Date 10/29/20      OT SHORT TERM GOAL #2   Title Pt will appropriately terminate task independently during therapeutic activity (e.g., tabletop activity, IADL task, etc)    Time 3    Period Weeks    Status Achieved             OT Long Term Goals - 10/26/20 1452       OT LONG TERM GOAL #1   Title Pt will demonstrate improved processing speed for improved safety during cooking tasks as evidenced by decreasing Trail Making Test: Part A time by at least 10 sec    Baseline TMT: Part A in 1 min, 11 sec (2 errors)    Time 6    Period Weeks    Status Achieved   10/19/20 - TMT: Part A in 57 sec w/ 1 error     OT LONG TERM GOAL #2   Title Pt will demonstrate independence w/ HEP designed for UB control/coordination and FM skills    Baseline 9HPT in 37 sec w/ LUE; 29 sec w/ RUE    Time 6    Period Weeks    Status Achieved      OT LONG TERM GOAL #3   Title Pt will safely complete simple, multi-step meal prep activity w/ less than 2 verbal cues by d/c    Time 6    Period Weeks    Status Achieved      OT LONG TERM GOAL #4   Title Pt will demonstrate appropriate initiation/termination of light housekeeping tasks (e.g., unloading dishwasher, making the bed, cleaning countertop) w/ Mod I in at least 2 trials    Time 6    Period Weeks    Status Achieved             Plan - 10/26/20 1608     Clinical Impression Statement [name] is a 60 y/o female who has been seen in OP OT for cognitive deficits and decreased R-sided strength and coordination 2/2 CVA 08/31/20. PMHx also significant for R A2/ACA aneurysm and L MCA bifurcation aneurysm identified 08/08/20 due to pt c/o headache and RUE numbness and subsequent R ACA aneurysm  embolization and L MCA aneurysm unrepaired 08/25/20. Pt has shown improvements in executive functioning and participation/independence in ADLs w/ STGs and LTGs met. Pt is continuing to receive skilled SLP and PT services to further address cognition and balance deficits. Pt is appropriate for d/c from skilled OT at this time w/ OT reviewing previously addressed compensatory strategies and coordination activities for pt to complete at home this session. Pt reports she is satisfied with progress and is currently agreeable to discharge  plan.    OT Occupational Profile and History Detailed Assessment- Review of Records and additional review of physical, cognitive, psychosocial history related to current functional performance    Occupational performance deficits (Please refer to evaluation for details): ADL's;IADL's;Rest and Sleep;Leisure;Social Participation    Body Structure / Function / Physical Skills ADL;Decreased knowledge of precautions;GMC;UE functional use;Strength;Vision;Sensation;IADL;Balance;Endurance;FMC;Body mechanics;Coordination;Mobility    Cognitive Skills Attention;Memory;Safety Awareness;Problem Solve;Orientation;Perception    Rehab Potential Good    Clinical Decision Making Several treatment options, min-mod task modification necessary    Comorbidities Affecting Occupational Performance: May have comorbidities impacting occupational performance    Modification or Assistance to Complete Evaluation  Min-Moderate modification of tasks or assist with assess necessary to complete eval    OT Frequency 1x / week    OT Duration 6 weeks    OT Treatment/Interventions Self-care/ADL training;Therapeutic exercise;Energy conservation;Therapist, nutritional;Therapeutic activities;Visual/perceptual remediation/compensation;Balance training;Psychosocial skills training;Patient/family education;Cognitive remediation/compensation;Manual Therapy;DME and/or AE instruction;Neuromuscular education    Plan  D/C    Recommended Other Services Pt is currently receiving SLP and PT services at this clinic    Consulted and Agree with Plan of Care Patient             Patient will benefit from skilled therapeutic intervention in order to improve the following deficits and impairments:   Body Structure / Function / Physical Skills: ADL, Decreased knowledge of precautions, GMC, UE functional use, Strength, Vision, Sensation, IADL, Balance, Endurance, FMC, Body mechanics, Coordination, Mobility Cognitive Skills: Attention, Memory, Safety Awareness, Problem Solve, Orientation, Perception   Visit Diagnosis: Unspecified symptoms and signs involving cognitive functions following cerebral infarction  Attention and concentration deficit  Unsteadiness on feet  Other lack of coordination  Visuospatial deficit  Muscle weakness (generalized)    Problem List Patient Active Problem List   Diagnosis Date Noted   Acute ischemic cerebrovascular accident (CVA) involving anterior cerebral artery territory ([place]) 09/03/2020   Acute CVA (cerebrovascular accident) ([place]) 09/01/2020   Acute ischemic stroke ([place]) 08/31/2020   Aneurysm of anterior communicating artery 08/25/2020   Aneurysm, cerebral, nonruptured 08/25/2020   Microcytic anemia 10/26/2014   Vitamin D deficiency 10/17/2014   Stenosing tenosynovitis of thumb 10/17/2014   Internal hemorrhoids 10/17/2014   History of colon polyps 10/17/2014   Osteoarthritis of carpometacarpal joints of both thumbs 04/04/2014   Pre-diabetes 08/18/2013   History of TIA (transient ischemic attack) 08/12/2013   Routine health maintenance 08/15/2010   Hyperlipidemia 08/05/2008   Obesity 07/30/2007   TOBACCO DEPENDENCE 04/19/2006    Kathrine Cords, OTR/L 10/27/2020, 4:22 PM  [facility]. [place], [place], [address] Phone: [phone]   Fax:  [phone]  Name: MIHIRA TOZZI MRN: [id number] Date  of Birth: [birth year]

## 2020-10-28 ENCOUNTER — Other Ambulatory Visit: Payer: Self-pay

## 2020-10-28 ENCOUNTER — Ambulatory Visit: Payer: Self-pay

## 2020-10-28 ENCOUNTER — Ambulatory Visit: Payer: Self-pay | Admitting: Speech Pathology

## 2020-10-28 ENCOUNTER — Encounter: Payer: Self-pay | Admitting: Speech Pathology

## 2020-10-28 DIAGNOSIS — R2689 Other abnormalities of gait and mobility: Secondary | ICD-10-CM

## 2020-10-28 DIAGNOSIS — R41841 Cognitive communication deficit: Secondary | ICD-10-CM

## 2020-10-28 DIAGNOSIS — M6281 Muscle weakness (generalized): Secondary | ICD-10-CM

## 2020-10-28 DIAGNOSIS — M25672 Stiffness of left ankle, not elsewhere classified: Secondary | ICD-10-CM

## 2020-10-28 DIAGNOSIS — R2681 Unsteadiness on feet: Secondary | ICD-10-CM

## 2020-10-28 DIAGNOSIS — I69354 Hemiplegia and hemiparesis following cerebral infarction affecting left non-dominant side: Secondary | ICD-10-CM

## 2020-10-28 NOTE — Therapy (Signed)
[facility] [place], [place], [address] Phone: [phone]   Fax:  [phone]  Physical Therapy Treatment  Patient Details  Name: Betty Archer MRN: [id number] Date of Birth: Aug 21, 1960 Referring Provider (PT): Bary Leriche, [place]   Encounter Date: 10/28/2020   PT End of Session - 10/28/20 1511     Visit Number 5    Number of Visits 17    Date for PT Re-Evaluation 12/03/20    Authorization Type Self Pay    PT Start Time [id number]    PT Stop Time 1425    PT Time Calculation (min) 43 min    Equipment Utilized During Treatment Gait belt    Activity Tolerance Patient tolerated treatment well    Behavior During Therapy [facility] for tasks assessed/performed;Flat affect             Past Medical History:  Diagnosis Date   Hyperlipidemia     Past Surgical History:  Procedure Laterality Date   BREAST BIOPSY      Core biopsy done on April 2003   COLONOSCOPY     IR 3D INDEPENDENT WKST  08/25/2020   IR 3D INDEPENDENT WKST  08/25/2020   IR ANGIO INTRA EXTRACRAN SEL INTERNAL CAROTID BILAT MOD SED  08/25/2020   IR ANGIO VERTEBRAL SEL VERTEBRAL BILAT MOD SED  08/25/2020   IR CT HEAD LTD  08/25/2020   IR INTRA CRAN STENT  08/25/2020   IR TRANSCATH/EMBOLIZ  08/25/2020   IR US GUIDE VASC ACCESS RIGHT  08/25/2020   PARTIAL HYSTERECTOMY   10/22/1999    Still has cervix   RADIOLOGY WITH ANESTHESIA N/A 08/25/2020   Procedure: IR WITH ANESTHESIA EMBOLIZATION;  Surgeon: Pedro Earls, MD;  Location: [place];  Service: Radiology;  Laterality: N/A;   TRIGGER FINGER RELEASE Left 12/25/2013   Procedure: LEFT THUMB TRIGGER RELEASE ;  Surgeon: Leanora Cover, MD;  Location: [place];  Service: Orthopedics;  Laterality: Left;    There were no vitals filed for this visit.   Subjective Assessment - 10/28/20 1344     Subjective No new complaints, no falls    Pertinent History HLD, repair of right ACA aneurysm, left MCA  aneurysm (unrepaired)    Diagnostic tests 09/01/20 brain MRI: Acute Right ACA territory infarct, Ischemia also in the Left ACA territory    Patient Stated Goals patient unable to verbalize goals for therapy but agreeable to work on strengthening    Currently in Pain? No/denies                               [facility] Adult PT Treatment/Exercise - 10/28/20 0001       High Level Balance   High Level Balance Activities Backward walking;Tandem walking   CGA   High Level Balance Comments 6" alt taps B to 6" step no UE support with SBA but no assist required t/o, no LOB.      Knee/Hip Exercises: Stretches   Gastroc Stretch Both;1 rep;20 seconds   BUE support, heels off 4" step within comfortable range.     Knee/Hip Exercises: Aerobic   Recumbent Bike level 2 x 6 minutes      Knee/Hip Exercises: Standing   Heel Raises Both;2 sets;10 reps   SBA, no UE support   Knee Flexion Strengthening;Left;2 sets;10 reps    Knee Flexion Limitations HS curl yellow loop in II bars  Forward Step Up Left;1 set;10 reps;Step Height: 6";Right;Hand Hold: 0    Forward Step Up Limitations Step up+ knee up x 5 each side in  bars, no UE support    Other Standing Knee Exercises toe raises 2 x 10 within available range finger touchdown support only    Other Standing Knee Exercises resisted ankle DF+ hip flexion with yellow loop around toes inn  bars, figner touch support for balance and SBA - 3 x 5 B alt. 2 x 10 rd TB resisted TKE in standing at  bars      Knee/Hip Exercises: Seated   Sit to Sand 10 reps;without UE support;2 sets   foam, 2nd set with red band to cue ABD                      PT Short Term Goals - 10/26/20 1533       PT SHORT TERM GOAL #1   Title Patient to be mod I/independent with initial HEP.    Status Achieved               PT Long Term Goals - 10/19/20 1539       PT LONG TERM GOAL #1   Title Patient to be mod I/independent with advanced HEP.     Time 8    Period Weeks    Status On-going      PT LONG TERM GOAL #2   Title Patient to demonstrate B LE strength >/=4+/5.    Time 8    Period Weeks    Status On-going      PT LONG TERM GOAL #3   Title Patient to demonstrate alternating reciprocal pattern when ascending and descending stairs with good stability and 1 handrail as needed.    Time 8    Period Weeks    Status On-going      PT LONG TERM GOAL #4   Title Patient to complete TUG in <14 sec with LRAD in order to decrease risk of falls.    Time 8    Period Weeks    Status On-going      PT LONG TERM GOAL #5   Title Patient to demonstrate atleast -10 degrees of L ankle dorsiflexion AROM for improved gait pattern.    Time 8    Period Weeks    Status On-going                   Plan - 10/28/20 1511     Clinical Impression Statement Pt does demonstrate decreased knee stability on L side although demonstrated pretty fgood balance with standing statc and dynamic exercises today. Ankle ROM with stretching observed symemtrical to right side but limtied active range. Trialed standing resited hip flexion + ankle DF with band around forefoot to work on activvating DF. Instability, some LOB noted moreso with dynamic/walking balance exercises moreso occuring with distractions and quick head turns to look around environment. CUes provided to atend to task as needed.    Personal Factors and Comorbidities Age;Comorbidity 3+;Time since onset of injury/illness/exacerbation;Fitness;Transportation    Comorbidities HLD, repair of right ACA aneurysm, left MCA aneurysm (unrepaired)    PT Treatment/Interventions ADLs/Self Care Home Management;Canalith Repostioning;Cryotherapy;Electrical Stimulation;DME Instruction;Ultrasound;Moist Heat;Gait training;Stair training;Functional mobility training;Therapeutic activities;Therapeutic exercise;Balance training;Neuromuscular re-education;Manual techniques;Patient/family education;Passive range of  motion;Dry needling;Energy conservation;Vestibular;Taping    PT Next Visit Plan continue to work on strength, balance and gait    Consulted and Agree with Plan of Care Patient  Patient will benefit from skilled therapeutic intervention in order to improve the following deficits and impairments:  Abnormal gait, Decreased coordination, Decreased range of motion, Difficulty walking, Increased fascial restricitons, Dizziness, Decreased activity tolerance, Decreased safety awareness, Pain, Decreased balance, Impaired flexibility, Improper body mechanics, Postural dysfunction, Increased edema, Decreased strength, Decreased mobility  Visit Diagnosis: Hemiplegia and hemiparesis following cerebral infarction affecting left non-dominant side (HCC)  Muscle weakness (generalized)  Stiffness of left ankle, not elsewhere classified  Other abnormalities of gait and mobility  Unsteadiness on feet     Problem List Patient Active Problem List   Diagnosis Date Noted   Acute ischemic cerebrovascular accident (CVA) involving anterior cerebral artery territory ([place]) 09/03/2020   Acute CVA (cerebrovascular accident) ([place]) 09/01/2020   Acute ischemic stroke ([place]) 08/31/2020   Aneurysm of anterior communicating artery 08/25/2020   Aneurysm, cerebral, nonruptured 08/25/2020   Microcytic anemia 10/26/2014   Vitamin D deficiency 10/17/2014   Stenosing tenosynovitis of thumb 10/17/2014   Internal hemorrhoids 10/17/2014   History of colon polyps 10/17/2014   Osteoarthritis of carpometacarpal joints of both thumbs 04/04/2014   Pre-diabetes 08/18/2013   History of TIA (transient ischemic attack) 08/12/2013   Routine health maintenance 08/15/2010   Hyperlipidemia 08/05/2008   Obesity 07/30/2007   TOBACCO DEPENDENCE 04/19/2006    Hall Busing, PT, DPT 10/28/2020, 3:12 PM  [facility] [place], [place], [address] Phone:  [phone]   Fax:  [phone]  Name: MILADA DESFORGES MRN: [id number] Date of Birth: 24-Mar-1960

## 2020-10-28 NOTE — Therapy (Signed)
[place] [address] [place], [place], [address] Phone: [phone]   Fax:  [phone]  Speech Language Pathology Treatment  Patient Details  Name: Betty Archer MRN: [id number] Date of Birth: 1960/08/13 Referring Provider (SLP): Reesa Chew [place]   Encounter Date: 10/28/2020   End of Session - 10/28/20 1528     Visit Number 8    Number of Visits 17    Date for SLP Re-Evaluation 12/07/20    SLP Start Time [id number]    Activity Tolerance Patient tolerated treatment well             Past Medical History:  Diagnosis Date   Hyperlipidemia     Past Surgical History:  Procedure Laterality Date   BREAST BIOPSY      Core biopsy done on April 2003   COLONOSCOPY     IR 3D INDEPENDENT WKST  08/25/2020   IR 3D INDEPENDENT WKST  08/25/2020   IR ANGIO INTRA EXTRACRAN SEL INTERNAL CAROTID BILAT MOD SED  08/25/2020   IR ANGIO VERTEBRAL SEL VERTEBRAL BILAT MOD SED  08/25/2020   IR CT HEAD LTD  08/25/2020   IR INTRA CRAN STENT  08/25/2020   IR TRANSCATH/EMBOLIZ  08/25/2020   IR US GUIDE VASC ACCESS RIGHT  08/25/2020   PARTIAL HYSTERECTOMY   10/22/1999    Still has cervix   RADIOLOGY WITH ANESTHESIA N/A 08/25/2020   Procedure: IR WITH ANESTHESIA EMBOLIZATION;  Surgeon: Pedro Earls, MD;  Location: [facility];  Service: Radiology;  Laterality: N/A;   TRIGGER FINGER RELEASE Left 12/25/2013   Procedure: LEFT THUMB TRIGGER RELEASE ;  Surgeon: Leanora Cover, MD;  Location: [facility];  Service: Orthopedics;  Laterality: Left;    There were no vitals filed for this visit.          ADULT SLP TREATMENT - 10/28/20 1532       General Information   Behavior/Cognition Cooperative;Alert      Treatment Provided   Treatment provided Cognitive-Linquistic      Cognitive-Linquistic Treatment   Treatment focused on Cognition    Skilled Treatment Pt facilitated sustained attention using a visual memory task to recall object  locations after a timed interval. SLP began with instructing the pt to recall three target objects and their locations after a 50 second period. Pt progressed throughout tx and was able to recall 5 target objects and their locations in 25 seconds using WRAP strategies. Pt reported that repetition of objects assisted her with recalling their location.      Assessment / Recommendations / Plan   Plan Continue with current plan of care      Progression Toward Goals   Progression toward goals Progressing toward goals                SLP Short Term Goals - 10/19/20 1639       SLP SHORT TERM GOAL #1   Title Pt will recall orientation (time and situation) post 10 minutes using spaced retrieval therapy.    Time 4    Period Weeks    Status On-going    Target Date 11/07/20      SLP SHORT TERM GOAL #2   Title Demonstrate sustained attention by maintaining focus during a task for 30 minutes with <1 verbal cue in a quiet environment.    Time 4    Period Weeks    Status Revised    Target Date 11/07/20  SLP SHORT TERM GOAL #3   Title Pt will read 3 prescription labels and organize corresponding pills into pill box with supervision from SLP.    Time 4    Period Weeks    Status On-going    Target Date 11/07/20              SLP Long Term Goals - 10/14/20 1548       SLP LONG TERM GOAL #1   Title Patient will be appropriately oriented to person, place, time, and situation without cues to improve safety and awareness in functional living environment.    Time 8    Period Weeks    Status On-going      SLP LONG TERM GOAL #2   Title Patient will develop sustained attention skills to effectively attend to and communicate in simple daily living tasks in functional living environment.    Time 8    Period Weeks    Status On-going      SLP LONG TERM GOAL #3   Title Pt will organize medication into pill box with minA from pt family.    Time 8    Period Weeks    Status On-going               Plan - 10/28/20 1546     Clinical Impression Statement See tx note. Pt was responsive to cueing in today's task.Cont with current POC.    Speech Therapy Frequency 2x / week    Duration 8 weeks    Treatment/Interventions Functional tasks;Patient/family education;Cueing hierarchy;Environmental controls;Cognitive reorganization;Compensatory techniques;Internal/external aids;SLP instruction and feedback;Multimodal communcation approach    Potential to Achieve Goals Good    Consulted and Agree with Plan of Care Patient;Family member/caregiver    Family Member [facility] - son             Patient will benefit from skilled therapeutic intervention in order to improve the following deficits and impairments:   Cognitive communication deficit    Problem List Patient Active Problem List   Diagnosis Date Noted   Acute ischemic cerebrovascular accident (CVA) involving anterior cerebral artery territory ([place]) 09/03/2020   Acute CVA (cerebrovascular accident) ([place]) 09/01/2020   Acute ischemic stroke ([place]) 08/31/2020   Aneurysm of anterior communicating artery 08/25/2020   Aneurysm, cerebral, nonruptured 08/25/2020   Microcytic anemia 10/26/2014   Vitamin D deficiency 10/17/2014   Stenosing tenosynovitis of thumb 10/17/2014   Internal hemorrhoids 10/17/2014   History of colon polyps 10/17/2014   Osteoarthritis of carpometacarpal joints of both thumbs 04/04/2014   Pre-diabetes 08/18/2013   History of TIA (transient ischemic attack) 08/12/2013   Routine health maintenance 08/15/2010   Hyperlipidemia 08/05/2008   Obesity 07/30/2007   TOBACCO DEPENDENCE 04/19/2006    [facility]  10/28/2020, 3:47 PM  [address] [place], [place], [address] Phone: [phone]   Fax:  [phone]   Name: Betty Archer MRN: [id number] Date of Birth:  05-18-1960

## 2020-11-02 ENCOUNTER — Ambulatory Visit: Payer: Self-pay | Admitting: Adult Health

## 2020-11-02 ENCOUNTER — Telehealth: Payer: Self-pay | Admitting: *Deleted

## 2020-11-02 ENCOUNTER — Telehealth: Payer: Self-pay

## 2020-11-02 ENCOUNTER — Ambulatory Visit: Payer: Self-pay | Admitting: Speech Pathology

## 2020-11-02 ENCOUNTER — Ambulatory Visit: Payer: Self-pay | Admitting: Occupational Therapy

## 2020-11-02 ENCOUNTER — Ambulatory Visit: Payer: Self-pay | Admitting: Physical Therapy

## 2020-11-02 ENCOUNTER — Other Ambulatory Visit: Payer: Self-pay

## 2020-11-02 ENCOUNTER — Encounter: Payer: Self-pay | Admitting: Adult Health

## 2020-11-02 ENCOUNTER — Encounter: Payer: Self-pay | Admitting: Speech Pathology

## 2020-11-02 VITALS — BP 121/76 | HR 81 | Ht 68.0 in | Wt 193.6 lb

## 2020-11-02 DIAGNOSIS — R269 Unspecified abnormalities of gait and mobility: Secondary | ICD-10-CM

## 2020-11-02 DIAGNOSIS — I63423 Cerebral infarction due to embolism of bilateral anterior cerebral arteries: Secondary | ICD-10-CM

## 2020-11-02 DIAGNOSIS — R2689 Other abnormalities of gait and mobility: Secondary | ICD-10-CM

## 2020-11-02 DIAGNOSIS — I69354 Hemiplegia and hemiparesis following cerebral infarction affecting left non-dominant side: Secondary | ICD-10-CM

## 2020-11-02 DIAGNOSIS — R41841 Cognitive communication deficit: Secondary | ICD-10-CM

## 2020-11-02 DIAGNOSIS — E785 Hyperlipidemia, unspecified: Secondary | ICD-10-CM

## 2020-11-02 DIAGNOSIS — I69398 Other sequelae of cerebral infarction: Secondary | ICD-10-CM

## 2020-11-02 DIAGNOSIS — I69319 Unspecified symptoms and signs involving cognitive functions following cerebral infarction: Secondary | ICD-10-CM

## 2020-11-02 DIAGNOSIS — R2681 Unsteadiness on feet: Secondary | ICD-10-CM

## 2020-11-02 DIAGNOSIS — M25672 Stiffness of left ankle, not elsewhere classified: Secondary | ICD-10-CM

## 2020-11-02 DIAGNOSIS — M6281 Muscle weakness (generalized): Secondary | ICD-10-CM

## 2020-11-02 NOTE — Progress Notes (Signed)
I agree with the above plan 

## 2020-11-02 NOTE — Telephone Encounter (Signed)
Patients son [name] called stating he had left FMLA papers with Amy when his mom had her appointment on 10/26/2020. He is calling to find out the status of the paperwork. Call back number is [phone]

## 2020-11-02 NOTE — Patient Instructions (Signed)
Continue working with therapies for likely ongoing recovery  Continue aspirin 81 mg daily and Brilinta (ticagrelor) 90 mg bid  and atorvastatin  for secondary stroke prevention  Continue to follow up with PCP regarding cholesterol and blood pressure management  Maintain strict control of hypertension with blood pressure goal below 130/90 and cholesterol with LDL cholesterol (bad cholesterol) goal below 70 mg/dL.   We will check lab work today    Followup in the future with me in 3 months or call earlier if needed       Thank you for coming to see [place] at [facility]. I hope we have been able to provide you high quality care today.  You may receive a patient satisfaction survey over the next few weeks. We would appreciate your feedback and comments so that we may continue to improve ourselves and the health of our patients.

## 2020-11-02 NOTE — Progress Notes (Signed)
[facility] [address] [place]. [place] [address] [phone]       [place] Date of Birth:  1960-05-16 Medical Record Number:  [id number]   Reason for Referral:  hospital stroke follow up    SUBJECTIVE:   CHIEF COMPLAINT:  Chief Complaint  Patient presents with   Cerebrovascular Accident    Rm 3 hospital FU, son- Betty Archer "still in therapy, son has questions about meds, disability, FMLA for son who lives with her"     HPI:   [name] is a 60 y.o. female with history of HLD, cerebral aneurysms including recent embolization and coiling of the R ACA aneurysm on 08/25/20 and untreated left MCA bifurcation 8 mm aneurysm who returned on 08/31/2020 with bilateral L>R lower extremity weakness.  MRI showed R ACA territory and a small L ACA territory infarct.  Per Dr. Erlinda Hong, etiology potentially be an embolic/thrombic infarct of the R ACA secondary to recent aneurysm repair.  CTA head/neck showed ACOM aneurysm s/p coiling and untreated left MCA bifurcation aneurysm.  EF 65 to 70%.  LDL 131.  A1c 5.3.  UDS negative.  Recommend continuation of aspirin and Brilinta at discharge as well as initiating atorvastatin 80 mg daily.  Other stroke risk factors include current tobacco use.  Residual deficits of psychomotor slowing, and L>R LE weakness as well as postop depression with initiating Prozac.  Therapies recommended CIR  Today, 11/02/2020, Betty Archer is being seen for hospital follow-up accompanied by her son, Betty Archer.  Overall doing well.  Currently working with therapies at [facility] gradual recovery of residual left-sided weakness, gait impairment and cognitive impairment. Her sons continue to provide 24/7 supervision.  She has been gradually returning back to maintaining ADLs independently but still need some assistance or supervision.  She has not yet returned back to any IADLs.  Her son was living with her previously but she was  completely independent.  Son is requesting FMLA paperwork to be completed.  Denies new stroke/TIA symptoms.  Remains on aspirin and Brilinta as well as atorvastatin.  Blood pressure today 121/76.  Continued tobacco use but able to decrease daily amt to currently 1 cigarette/day with goals of complete cessation soon.  Currently awaiting to undergo procedure of left MCA aneurysm with Dr. Norma Fredrickson -not yet scheduled.  No further concerns at this time.    PERTINENT IMAGING  MR BRAIN 09/01/2020 IMPRESSION: 1. Confluent Acute Right ACA territory infarct. This includes involvement of the anterior basal ganglia (medial striate artery territory). Ischemia also in the Left ACA territory but limited to cortex of the cingulate gyrus. 2. Cytotoxic edema but no associated hemorrhage or significant mass effect.  CTA HEAD/NECK 08/31/2020 IMPRESSION: 1. No emergent large vessel occlusion or high-grade stenosis. 2. 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 3. Coil mass at the anterior communicating artery without visible residual aneurysm filling. 4. Aberrant right subclavian artery.        ROS:   14 system review of systems performed and negative with exception of those listed in HPI  PMH:  Past Medical History:  Diagnosis Date   Hyperlipidemia    Stroke [facility])     PSH:  Past Surgical History:  Procedure Laterality Date   BREAST BIOPSY      Core biopsy done on April 2003   COLONOSCOPY     IR 3D INDEPENDENT WKST  08/25/2020   IR 3D INDEPENDENT WKST  08/25/2020   IR  ANGIO INTRA EXTRACRAN SEL INTERNAL CAROTID BILAT MOD SED  08/25/2020   IR ANGIO VERTEBRAL SEL VERTEBRAL BILAT MOD SED  08/25/2020   IR CT HEAD LTD  08/25/2020   IR INTRA CRAN STENT  08/25/2020   IR TRANSCATH/EMBOLIZ  08/25/2020   IR US GUIDE VASC ACCESS RIGHT  08/25/2020   PARTIAL HYSTERECTOMY   10/22/1999    Still has cervix   RADIOLOGY WITH ANESTHESIA N/A 08/25/2020   Procedure: IR WITH ANESTHESIA EMBOLIZATION;  Surgeon: Pedro Earls, MD;  Location: [place];  Service: Radiology;  Laterality: N/A;   TRIGGER FINGER RELEASE Left 12/25/2013   Procedure: LEFT THUMB TRIGGER RELEASE ;  Surgeon: Leanora Cover, MD;  Location: [place];  Service: Orthopedics;  Laterality: Left;    Social History:  Social History   Socioeconomic History   Marital status: Single    Spouse name: Not on file   Number of children: 3   Years of education: Not on file   Highest education level: Not on file  Occupational History   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    Employer: [employer]  Tobacco Use   Smoking status: Every Day    Packs/day: 0.50    Years: 27.00    Pack years: 13.50    Types: Cigarettes   Smokeless tobacco: Never   Tobacco comments:    11/02/20 one a day  Vaping Use   Vaping Use: Never used  Substance and Sexual Activity   Alcohol use: Yes    Alcohol/week: 0.0 standard drinks    Comment:  patient is a social drinker   Drug use: No   Sexual activity: Not on file  Other Topics Concern   Not on file  Social History Narrative    She has 3 son who are healthy ,    11/02/20 lives with son, Betty Archer    worked as a Research officer, 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   Social Determinants of Health   Financial Resource Strain: Not on file  Food Insecurity: Not on file  Transportation Needs: Not on file  Physical Activity: Not on file  Stress: Not on file  Social Connections: Not on file  Intimate Partner Violence: Not on file    Family History:  Family History  Problem Relation Age of Onset   Hypertension Mother    Lung cancer Father    Breast cancer Maternal Grandmother    Breast cancer Maternal Aunt    Colon cancer Neg Hx    Esophageal cancer Neg Hx    Stomach cancer Neg Hx     Medications:   Current Outpatient Medications on File Prior to Visit  Medication Sig Dispense Refill   acetaminophen (TYLENOL) 325 MG tablet Take 2 tablets (650 mg total) by mouth every 6  (six) hours as needed for mild pain or headache. 100 tablet 0   aspirin 81 MG chewable tablet Chew 1 tablet (81 mg total) by mouth in the morning and at bedtime. 100 tablet 0   atorvastatin (LIPITOR) 80 MG tablet Take 1 tablet (80 mg total) by mouth at bedtime. 30 tablet 0   lidocaine (LIDODERM) 5 % Place 1 patch onto the skin daily. Apply at 8 am and remove at  pm daily. 30 patch 0   methylphenidate (RITALIN) 10 MG tablet Take 1.5 tablets (15 mg total) by mouth 2 (two) times daily with breakfast and lunch. 90 tablet 0   senna-docusate (SENOKOT-S) 8.6-50 MG tablet Take  2 tablets by mouth at bedtime. 60 tablet 0   ticagrelor (BRILINTA) 90 MG TABS tablet Take 1 tablet (90 mg total) by mouth 2 (two) times daily. 60 tablet 0   No current facility-administered medications on file prior to visit.    Allergies:  No Known Allergies    OBJECTIVE:  Physical Exam  Vitals:   11/02/20 1102  BP: 121/76  Pulse: 81  Weight: 193 lb 9.6 oz (87.8 kg)  Height: '5\' 8"'$  (1.727 m)   Body mass index is 29.44 kg/m. No results found.  Post stroke PHQ 2/9 Depression screen PHQ 2/9 11/02/2020  Decreased Interest 0  Down, Depressed, Hopeless 0  PHQ - 2 Score 0  Altered sleeping -  Tired, decreased energy -  Change in appetite -  Feeling bad or failure about yourself  -  Trouble concentrating -  Moving slowly or fidgety/restless -  Suicidal thoughts -  PHQ-9 Score -  Difficult doing work/chores -     General: well developed, well nourished, pleasant middle-aged African-American female, seated, in no evident distress Head: head normocephalic and atraumatic.   Neck: supple with no carotid or supraclavicular bruits Cardiovascular: regular rate and rhythm, no murmurs Musculoskeletal: no deformity Skin:  no rash/petichiae Vascular:  Normal pulses all extremities   Neurologic Exam Mental Status: Awake and fully alert.  Fluent speech and language although minimally participated in conversation.  Able  to follow commands without difficulty.  Recent and remote memory impaired. Attention span, concentration and fund of knowledge impaired. Mood and affect flat.  Cranial Nerves: Fundoscopic exam reveals sharp disc margins. Pupils equal, briskly reactive to light. Extraocular movements full without nystagmus. Visual fields full to confrontation. Hearing intact. Facial sensation intact.  Mild left lower facial weakness.  Tongue, palate moves normally and symmetrically.  Motor: Normal strength, bulk and tone on right side.  LUE: 4+/5; LLE: 3/5 hip flexor (although questionable effort), 4/5 knee extension and flexion and ADF Sensory.: intact to touch , pinprick , position and vibratory sensation.  Coordination: Rapid alternating movements normal in all extremities except decreased left hand. Finger-to-nose and heel-to-shin performed accurately on right side.  Orbits right arm over left lower Gait and Station: Arises from chair without difficulty. Stance is normal. Gait demonstrates normal stride length with mild imbalance and slight decreased L step height without use of assistive device.  Tandem walk and heel toe not attempted.  Reflexes: 1+ and symmetric. Toes downgoing.     NIHSS  1 Modified Rankin  3      ASSESSMENT: Betty Archer is a 60 y.o. year old female with recent large right ACA and small left ACA infarcts on 08/31/2020 likely related to recent ACOM aneurysm s/p coiling and stenting. Vascular risk factors include cerebral aneurysms s/p R ACOM coiling and stenting 7/6 and untreated left MCA bifurcation 8 mm aneurysm, HTN, HLD and tobacco use.      PLAN:  Large right ACA and small left ACA infarcts:  Residual deficit: Left hemiparesis, gait impairment and cognitive impairment.  Continue working with therapies. Will assist son with FMLA paperwork as she needs continued 24/7 supervision. Paperwork previously provided to PCP who refused to complete until she is an established patient for  over 3 months. He plans on bring paperwork to office today Continue aspirin 81 mg daily and Brilinta (ticagrelor) 90 mg bid  and atorvastatin 80 mg daily for secondary stroke prevention.   Discussed secondary stroke prevention measures and importance of close PCP follow up for  aggressive stroke risk factor management. I have gone over the pathophysiology of stroke, warning signs and symptoms, risk factors and their management in some detail with instructions to go to the closest emergency room for symptoms of concern. HTN: BP goal <130/90.  Stable on nonpharmacological management per PCP HLD: LDL goal <70. Recent LDL 131.  Continue atorvastatin 80 mg daily.  Repeat lipid panel today Cerebral aneurysms: Followed by IR Dr. Norma Fredrickson - advised ongoing duration of Brilinta to be determined by Dr. Norma Fredrickson Tobacco use: Discussed importance of complete tobacco cessation.  She continues to gradually decrease amount with plans on complete cessation     Follow up in 3 months or call earlier if needed   CC:  [facility] provider: Dr. Leonie Man PCP: Camillia Herter, NP    I spent 56 minutes of face-to-face and non-face-to-face time with patient and son.  This included previsit chart review including review of recent hospitalization, lab review, study review, order entry, electronic health record documentation, patient and son education regarding recent stroke including etiology, secondary stroke prevention measures and importance of managing stroke risk factors, residual deficits and typical recovery time and answered all other questions to patient and sons satisfaction  Frann Rider, AGNP-BC  [facility] [address] [place], [place] [address]  Phone [phone] Fax [phone] Note: This document was prepared with digital dictation and possible smart phrase technology. Any transcriptional errors that result from this process are unintentional.

## 2020-11-02 NOTE — Therapy (Signed)
[place]. [place], [place], [address] Phone: [phone]   Fax:  [phone]  Physical Therapy Treatment  Patient Details  Name: Betty Archer MRN: [id number] Date of Birth: 03/10/1960 Referring Provider (PT): Bary Leriche, [place]   Encounter Date: 11/02/2020   PT End of Session - 11/02/20 1353     Visit Number 6    Number of Visits 17    Date for PT Re-Evaluation 12/03/20    Authorization Type Self Pay    PT Start Time 8938    PT Stop Time 1017    PT Time Calculation (min) 42 min    Activity Tolerance Patient tolerated treatment well    Behavior During Therapy [facility] for tasks assessed/performed;Flat affect             Past Medical History:  Diagnosis Date   Hyperlipidemia    Stroke [facility])     Past Surgical History:  Procedure Laterality Date   BREAST BIOPSY      Core biopsy done on April 2003   COLONOSCOPY     IR 3D INDEPENDENT WKST  08/25/2020   IR 3D INDEPENDENT WKST  08/25/2020   IR ANGIO INTRA EXTRACRAN SEL INTERNAL CAROTID BILAT MOD SED  08/25/2020   IR ANGIO VERTEBRAL SEL VERTEBRAL BILAT MOD SED  08/25/2020   IR CT HEAD LTD  08/25/2020   IR INTRA CRAN STENT  08/25/2020   IR TRANSCATH/EMBOLIZ  08/25/2020   IR US GUIDE VASC ACCESS RIGHT  08/25/2020   PARTIAL HYSTERECTOMY   10/22/1999    Still has cervix   RADIOLOGY WITH ANESTHESIA N/A 08/25/2020   Procedure: IR WITH ANESTHESIA EMBOLIZATION;  Surgeon: Pedro Earls, MD;  Location: [place];  Service: Radiology;  Laterality: N/A;   TRIGGER FINGER RELEASE Left 12/25/2013   Procedure: LEFT THUMB TRIGGER RELEASE ;  Surgeon: Leanora Cover, MD;  Location: [place];  Service: Orthopedics;  Laterality: Left;    There were no vitals filed for this visit.   Subjective Assessment - 11/02/20 1316     Subjective No falls, no stumbles    Currently in Pain? No/denies                               [facility] Adult PT  Treatment/Exercise - 11/02/20 0001       High Level Balance   High Level Balance Activities Negotitating around obstacles;Negotiating over obstacles    High Level Balance Comments on airex ball toss, cone toe taps, airex balance beam tandem walk and side stepping, waling ball toss, on airex putting foot in box and out      Knee/Hip Exercises: Aerobic   Recumbent Bike level 3.5 x 6 minutes    Nustep level 5 x 6 minutes      Knee/Hip Exercises: Machines for Strengthening   Cybex Knee Extension 10# 2x10    Cybex Knee Flexion 20# 2x10      Knee/Hip Exercises: Standing   Walking with Sports Cord all directions                       PT Short Term Goals - 10/26/20 1533       PT SHORT TERM GOAL #1   Title Patient to be mod I/independent with initial HEP.    Status Achieved  PT Long Term Goals - 11/02/20 1355       PT LONG TERM GOAL #1   Title Patient to be mod I/independent with advanced HEP.    Status On-going      PT LONG TERM GOAL #2   Title Patient to demonstrate B LE strength >/=4+/5.    Status Partially Met      PT LONG TERM GOAL #3   Title Patient to demonstrate alternating reciprocal pattern when ascending and descending stairs with good stability and 1 handrail as needed.    Status Partially Met                   Plan - 11/02/20 1354     Clinical Impression Statement Patient doing very well, only off balance on the balance beam activities, she is not very verbal but was able to speak about [place] and did well with this.  Still some strength issues LE wise but is improving    PT Next Visit Plan continue to work on strength, balance and gait    Consulted and Agree with Plan of Care Patient             Patient will benefit from skilled therapeutic intervention in order to improve the following deficits and impairments:  Abnormal gait, Decreased coordination, Decreased range of motion, Difficulty walking, Increased  fascial restricitons, Dizziness, Decreased activity tolerance, Decreased safety awareness, Pain, Decreased balance, Impaired flexibility, Improper body mechanics, Postural dysfunction, Increased edema, Decreased strength, Decreased mobility  Visit Diagnosis: Hemiplegia and hemiparesis following cerebral infarction affecting left non-dominant side (HCC)  Muscle weakness (generalized)  Stiffness of left ankle, not elsewhere classified  Other abnormalities of gait and mobility  Unsteadiness on feet     Problem List Patient Active Problem List   Diagnosis Date Noted   Acute ischemic cerebrovascular accident (CVA) involving anterior cerebral artery territory ([place]) 09/03/2020   Acute CVA (cerebrovascular accident) ([place]) 09/01/2020   Acute ischemic stroke ([place]) 08/31/2020   Aneurysm of anterior communicating artery 08/25/2020   Aneurysm, cerebral, nonruptured 08/25/2020   Microcytic anemia 10/26/2014   Vitamin D deficiency 10/17/2014   Stenosing tenosynovitis of thumb 10/17/2014   Internal hemorrhoids 10/17/2014   History of colon polyps 10/17/2014   Osteoarthritis of carpometacarpal joints of both thumbs 04/04/2014   Pre-diabetes 08/18/2013   History of TIA (transient ischemic attack) 08/12/2013   Routine health maintenance 08/15/2010   Hyperlipidemia 08/05/2008   Obesity 07/30/2007   TOBACCO DEPENDENCE 04/19/2006    Sumner Boast, PT 11/02/2020, 1:56 PM  [place]. [place], [place], [address] Phone: [phone]   Fax:  [phone]  Name: KHRYSTYNA SCHWALM MRN: [id number] Date of Birth: 12/31/1960

## 2020-11-02 NOTE — Therapy (Signed)
[place]. [place], [place], [address] Phone: [phone]   Fax:  [phone]  Speech Language Pathology Treatment  Patient Details  Name: Betty Archer MRN: [id number] Date of Birth: 06-13-1960 Referring Provider (SLP): Reesa Chew [place]   Encounter Date: 11/02/2020   End of Session - 11/02/20 1627     Visit Number 9    Number of Visits 17    Date for SLP Re-Evaluation 12/07/20    SLP Start Time 1350    SLP Stop Time  1435    SLP Time Calculation (min) 45 min    Activity Tolerance Patient tolerated treatment well             Past Medical History:  Diagnosis Date   Hyperlipidemia    Stroke [facility])     Past Surgical History:  Procedure Laterality Date   BREAST BIOPSY      Core biopsy done on April 2003   COLONOSCOPY     IR 3D INDEPENDENT WKST  08/25/2020   IR 3D INDEPENDENT WKST  08/25/2020   IR ANGIO INTRA EXTRACRAN SEL INTERNAL CAROTID BILAT MOD SED  08/25/2020   IR ANGIO VERTEBRAL SEL VERTEBRAL BILAT MOD SED  08/25/2020   IR CT HEAD LTD  08/25/2020   IR INTRA CRAN STENT  08/25/2020   IR TRANSCATH/EMBOLIZ  08/25/2020   IR US GUIDE VASC ACCESS RIGHT  08/25/2020   PARTIAL HYSTERECTOMY   10/22/1999    Still has cervix   RADIOLOGY WITH ANESTHESIA N/A 08/25/2020   Procedure: IR WITH ANESTHESIA EMBOLIZATION;  Surgeon: Pedro Earls, MD;  Location: [place];  Service: Radiology;  Laterality: N/A;   TRIGGER FINGER RELEASE Left 12/25/2013   Procedure: LEFT THUMB TRIGGER RELEASE ;  Surgeon: Leanora Cover, MD;  Location: [place];  Service: Orthopedics;  Laterality: Left;    There were no vitals filed for this visit.   Subjective Assessment - 11/02/20 1627     Subjective Pt reports she is going to [place] for her birthday tomorrow and is excited.    Currently in Pain? No/denies                   ADULT SLP TREATMENT - 11/02/20 0001       General Information   Behavior/Cognition  Cooperative;Alert      Treatment Provided   Treatment provided Cognitive-Linquistic      Cognitive-Linquistic Treatment   Treatment focused on Cognition    Skilled Treatment Pt was reassessed for the following goals: orientation and medication management. Pt was oriented to time, place, and situation. Pt completed a medication management task with modA. Pt exhibited difficulties with calculating the correct number of pills for each day. SLP began edu on attention strategies and encouraged pt to reflect on any attention difficulties in her iADLs. SLP to cont training pt on attention strategies next visit.      Assessment / Recommendations / Plan   Plan Continue with current plan of care      Progression Toward Goals   Progression toward goals Progressing toward goals                SLP Short Term Goals - 11/02/20 1629       SLP SHORT TERM GOAL #1   Title Pt will recall orientation (time and situation) post 10 minutes using spaced retrieval therapy.    Time 4    Period Weeks    Status  Achieved   11/02/20   Target Date 11/07/20      SLP SHORT TERM GOAL #2   Title Demonstrate sustained attention by maintaining focus during a task for 30 minutes with <1 verbal cue in a quiet environment.    Time 4    Period Weeks    Status Achieved   11/02/20   Target Date 11/07/20      SLP SHORT TERM GOAL #3   Title Pt will read 3 prescription labels and organize corresponding pills into pill box with supervision from SLP.    Time 4    Period Weeks    Status On-going   To cont goal; goal not met   Target Date 11/07/20              SLP Long Term Goals - 11/02/20 1630       SLP LONG TERM GOAL #1   Title Patient will be appropriately oriented to person, place, time, and situation without cues to improve safety and awareness in functional living environment.    Time 8    Period Weeks    Status Achieved   11/02/20     SLP LONG TERM GOAL #2   Title Patient will develop sustained  attention skills to effectively attend to and communicate in simple daily living tasks in functional living environment.    Time 8    Period Weeks    Status Achieved   11/02/20     SLP LONG TERM GOAL #3   Title Pt will organize medication into pill box with minA from pt family.    Time 8    Period Weeks    Status On-going              Plan - 11/02/20 1628     Clinical Impression Statement See tx note. Pt has met goal for orientation and sustained attention. Cont with medication management goal. Cont with current POC.    Speech Therapy Frequency 2x / week    Duration 8 weeks    Treatment/Interventions Functional tasks;Patient/family education;Cueing hierarchy;Environmental controls;Cognitive reorganization;Compensatory techniques;Internal/external aids;SLP instruction and feedback;Multimodal communcation approach    Potential to Achieve Goals Good    Consulted and Agree with Plan of Care Patient;Family member/caregiver    Family Member [facility] - son             Patient will benefit from skilled therapeutic intervention in order to improve the following deficits and impairments:   Cognitive communication deficit    Problem List Patient Active Problem List   Diagnosis Date Noted   Acute ischemic cerebrovascular accident (CVA) involving anterior cerebral artery territory ([place]) 09/03/2020   Acute CVA (cerebrovascular accident) ([place]) 09/01/2020   Acute ischemic stroke ([place]) 08/31/2020   Aneurysm of anterior communicating artery 08/25/2020   Aneurysm, cerebral, nonruptured 08/25/2020   Microcytic anemia 10/26/2014   Vitamin D deficiency 10/17/2014   Stenosing tenosynovitis of thumb 10/17/2014   Internal hemorrhoids 10/17/2014   History of colon polyps 10/17/2014   Osteoarthritis of carpometacarpal joints of both thumbs 04/04/2014   Pre-diabetes 08/18/2013   History of TIA (transient ischemic attack) 08/12/2013   Routine health maintenance 08/15/2010    Hyperlipidemia 08/05/2008   Obesity 07/30/2007   TOBACCO DEPENDENCE 04/19/2006    Danise Mina B.S. Communication Sciences and Disorders  11/02/2020, 4:35 PM  [place]. [place], [place], [address] Phone: [phone]   Fax:  [phone]   Name: Betty Archer  MRN: [id number] Date of Birth: [birth year]

## 2020-11-02 NOTE — Telephone Encounter (Signed)
FMLA for patient's son, [name] who cares for her completed by NP and faxed to [place], [phone]. Received confirmation of fax.

## 2020-11-03 LAB — LIPID PANEL
Chol/HDL Ratio: 2.1 ratio (ref 0.0–4.4)
Cholesterol, Total: 122 mg/dL (ref 100–199)
HDL: 59 mg/dL (ref >39)
LDL Chol Calc (NIH): 49 mg/dL (ref 0–99)
Triglycerides: 70 mg/dL (ref 0–149)
VLDL Cholesterol Cal: 14 mg/dL (ref 5–40)

## 2020-11-04 ENCOUNTER — Ambulatory Visit: Payer: Self-pay | Admitting: Speech Pathology

## 2020-11-04 ENCOUNTER — Other Ambulatory Visit: Payer: Self-pay

## 2020-11-04 ENCOUNTER — Encounter: Payer: Self-pay | Admitting: Physical Therapy

## 2020-11-04 ENCOUNTER — Ambulatory Visit: Payer: Self-pay | Admitting: Physical Therapy

## 2020-11-04 ENCOUNTER — Encounter: Payer: Self-pay | Admitting: Speech Pathology

## 2020-11-04 DIAGNOSIS — R2681 Unsteadiness on feet: Secondary | ICD-10-CM

## 2020-11-04 DIAGNOSIS — R2689 Other abnormalities of gait and mobility: Secondary | ICD-10-CM

## 2020-11-04 DIAGNOSIS — I69354 Hemiplegia and hemiparesis following cerebral infarction affecting left non-dominant side: Secondary | ICD-10-CM

## 2020-11-04 DIAGNOSIS — M6281 Muscle weakness (generalized): Secondary | ICD-10-CM

## 2020-11-04 DIAGNOSIS — M25672 Stiffness of left ankle, not elsewhere classified: Secondary | ICD-10-CM

## 2020-11-04 DIAGNOSIS — R41841 Cognitive communication deficit: Secondary | ICD-10-CM

## 2020-11-04 NOTE — Therapy (Signed)
[place] [place], [place], [address] Phone: [phone]   Fax:  [phone]  Physical Therapy Progress Note  Patient Details  Name: Betty Archer MRN: [id number] Date of Birth: [birth year] Referring Provider (PT): Bary Leriche, [place]   Encounter Date: 11/04/2020   PT End of Session - 11/04/20 1548     Visit Number 7    Number of Visits 17    Date for PT Re-Evaluation 12/03/20    Authorization Type Self Pay    PT Start Time 1408   pt late   PT Stop Time 1444    PT Time Calculation (min) 36 min    Equipment Utilized During Treatment Gait belt    Activity Tolerance Patient tolerated treatment well    Behavior During Therapy [facility] for tasks assessed/performed;Flat affect             Past Medical History:  Diagnosis Date   Hyperlipidemia    Stroke [facility])     Past Surgical History:  Procedure Laterality Date   BREAST BIOPSY      Core biopsy done on April 2003   COLONOSCOPY     IR 3D INDEPENDENT WKST  08/25/2020   IR 3D INDEPENDENT WKST  08/25/2020   IR ANGIO INTRA EXTRACRAN SEL INTERNAL CAROTID BILAT MOD SED  08/25/2020   IR ANGIO VERTEBRAL SEL VERTEBRAL BILAT MOD SED  08/25/2020   IR CT HEAD LTD  08/25/2020   IR INTRA CRAN STENT  08/25/2020   IR TRANSCATH/EMBOLIZ  08/25/2020   IR US GUIDE VASC ACCESS RIGHT  08/25/2020   PARTIAL HYSTERECTOMY   10/22/1999    Still has cervix   RADIOLOGY WITH ANESTHESIA N/A 08/25/2020   Procedure: IR WITH ANESTHESIA EMBOLIZATION;  Surgeon: Pedro Earls, MD;  Location: [place];  Service: Radiology;  Laterality: N/A;   TRIGGER FINGER RELEASE Left 12/25/2013   Procedure: LEFT THUMB TRIGGER RELEASE ;  Surgeon: Leanora Cover, MD;  Location: [place];  Service: Orthopedics;  Laterality: Left;    There were no vitals filed for this visit.   Subjective Assessment - 11/04/20 1410     Subjective Been doing fine. Denies recent falls.    Pertinent History HLD,  repair of right ACA aneurysm, left MCA aneurysm (unrepaired)    Diagnostic tests 09/01/20 brain MRI: Acute Right ACA territory infarct, Ischemia also in the Left ACA territory    Patient Stated Goals patient unable to verbalize goals for therapy but agreeable to work on strengthening    Currently in Pain? No/denies                [facility] PT Assessment - 11/04/20 1414       Assessment   Medical Diagnosis Acute ischemic stroke    Referring Provider (PT) Bary Leriche, PA-C    Onset Date/Surgical Date 08/31/20      AROM   Overall AROM Comments R DF AROM 5 deg, L DF AROM -4 deg      Strength   Overall Strength Comments R hip: Flex 4+, ABD 4+, ADD 4. L hip: Flex 4, ABD 4+, ADD 4. R knee: ex 4+, flex 4+. L knee: ex 4+, flex 4+. R ankle: DF4+, PF 4+. L ankle: DF4, PF 4+      Standardized Balance Assessment   Standardized Balance Assessment Dynamic Gait Index      Dynamic Gait Index   Level Surface Normal    Change in  Gait Speed Moderate Impairment    Gait with Horizontal Head Turns Normal    Gait with Vertical Head Turns Normal    Gait and Pivot Turn Normal    Step Over Obstacle Normal    Step Around Obstacles Normal    Steps Normal    Total Score 22      Timed Up and Go Test   Normal TUG (seconds) --   12.0 sec; no AD                          [facility] Adult PT Treatment/Exercise - 11/04/20 0001       Ambulation/Gait   Gait Comments stair climbing up/down clinic stairs 2x without UE support, recpirocal pattern, with good control and stability      Knee/Hip Exercises: Aerobic   Nustep level 5 x 5 minutes      Knee/Hip Exercises: Standing   Hip Flexion Stengthening;Both;20 reps;Knee bent;2 sets    Hip Flexion Limitations resisted march with red TB    Hip ADduction Left;Strengthening;1 set;15 reps    Hip ADduction Limitations red TB in II bars      Knee/Hip Exercises: Seated   Other Seated Knee/Hip Exercises L ankle DF with red TB x15                      PT Education - 11/04/20 1545     Education Details update to HEP; discussion on objective progress and remaining impairments    Person(s) Educated Patient    Methods Explanation;Demonstration;Tactile cues;Verbal cues;Handout    Comprehension Verbalized understanding;Returned demonstration              PT Short Term Goals - 11/04/20 1551       PT SHORT TERM GOAL #1   Title Patient to be mod I/independent with initial HEP.    Status Achieved               PT Long Term Goals - 11/04/20 1551       PT LONG TERM GOAL #1   Title Patient to be mod I/independent with advanced HEP.    Status Achieved      PT LONG TERM GOAL #2   Title Patient to demonstrate B LE strength >/=4+/5.    Status Partially Met   weak in L hip flexion, adduction, ankle DF strength     PT LONG TERM GOAL #3   Title Patient to demonstrate alternating reciprocal pattern when ascending and descending stairs with good stability and 1 handrail as needed.    Status Achieved      PT LONG TERM GOAL #4   Title Patient to complete TUG in <14 sec with LRAD in order to decrease risk of falls.    Status Achieved      PT LONG TERM GOAL #5   Title Patient to demonstrate atleast -10 degrees of L ankle dorsiflexion AROM for improved gait pattern.    Status Achieved                   Plan - 11/04/20 1549     Clinical Impression Statement Patient arrived to session without new complaints. Strength testing revealed improvement in L hip and knee; still with some mild strength deficits remaining on this L LE in hip flexion, adduction, and dorsiflexion. B ankle dorsiflexion has improved on B sides, however still unable to reach neutral on L ankle. Patient's scores on TUG and  DGI now demonstrate a decreased risk of falls. stair climbing up/down clinic stairs 2x without UE support with good control and stability. Patient's remaining impairments include limited dorsiflexion ROM and some remaining  L LE weakness. Balance has improved. Plan to continue POC with focus on these deficits. Patient performed LE strengthening ther-ex to address these deficits safely, thus updated into HEP. Patient reported understanding and without complaints at end of session.    Comorbidities HLD, repair of right ACA aneurysm, left MCA aneurysm (unrepaired)    PT Treatment/Interventions ADLs/Self Care Home Management;Canalith Repostioning;Cryotherapy;Electrical Stimulation;DME Instruction;Ultrasound;Moist Heat;Gait training;Stair training;Functional mobility training;Therapeutic activities;Therapeutic exercise;Balance training;Neuromuscular re-education;Manual techniques;Patient/family education;Passive range of motion;Dry needling;Energy conservation;Vestibular;Taping    PT Next Visit Plan continue to work on L hip flexion, adduction, ankle DF strength; ankle DF ROM    Consulted and Agree with Plan of Care Patient             Patient will benefit from skilled therapeutic intervention in order to improve the following deficits and impairments:  Abnormal gait, Decreased coordination, Decreased range of motion, Difficulty walking, Increased fascial restricitons, Dizziness, Decreased activity tolerance, Decreased safety awareness, Pain, Decreased balance, Impaired flexibility, Improper body mechanics, Postural dysfunction, Increased edema, Decreased strength, Decreased mobility  Visit Diagnosis: Hemiplegia and hemiparesis following cerebral infarction affecting left non-dominant side (HCC)  Muscle weakness (generalized)  Stiffness of left ankle, not elsewhere classified  Other abnormalities of gait and mobility  Unsteadiness on feet     Problem List Patient Active Problem List   Diagnosis Date Noted   Acute ischemic cerebrovascular accident (CVA) involving anterior cerebral artery territory ([place]) 09/03/2020   Acute CVA (cerebrovascular accident) ([place]) 09/01/2020   Acute ischemic stroke ([place]) 08/31/2020    Aneurysm of anterior communicating artery 08/25/2020   Aneurysm, cerebral, nonruptured 08/25/2020   Microcytic anemia 10/26/2014   Vitamin D deficiency 10/17/2014   Stenosing tenosynovitis of thumb 10/17/2014   Internal hemorrhoids 10/17/2014   History of colon polyps 10/17/2014   Osteoarthritis of carpometacarpal joints of both thumbs 04/04/2014   Pre-diabetes 08/18/2013   History of TIA (transient ischemic attack) 08/12/2013   Routine health maintenance 08/15/2010   Hyperlipidemia 08/05/2008   Obesity 07/30/2007   TOBACCO DEPENDENCE 04/19/2006     Janene Harvey, PT, DPT 11/04/20 3:54 PM   [place]. [place], [place], [address] Phone: [phone]   Fax:  [phone]  Name: PAMMIE CHIRINO MRN: [id number] Date of Birth: 22-Jan-1961

## 2020-11-04 NOTE — Therapy (Signed)
[place]. [place], [place], [address] Phone: [phone]   Fax:  [phone]  Speech Language Pathology Treatment  Patient Details  Name: Betty Archer MRN: [id number] Date of Birth: 01-Aug-1960 Referring Provider (SLP): Reesa Chew [place]   Encounter Date: 11/04/2020   End of Session - 11/04/20 1448     Visit Number 10    Number of Visits 17    Date for SLP Re-Evaluation 12/07/20    SLP Start Time 8242    SLP Stop Time  1525    SLP Time Calculation (min) 40 min    Activity Tolerance Patient tolerated treatment well             Past Medical History:  Diagnosis Date   Hyperlipidemia    Stroke [facility])     Past Surgical History:  Procedure Laterality Date   BREAST BIOPSY      Core biopsy done on April 2003   COLONOSCOPY     IR 3D INDEPENDENT WKST  08/25/2020   IR 3D INDEPENDENT WKST  08/25/2020   IR ANGIO INTRA EXTRACRAN SEL INTERNAL CAROTID BILAT MOD SED  08/25/2020   IR ANGIO VERTEBRAL SEL VERTEBRAL BILAT MOD SED  08/25/2020   IR CT HEAD LTD  08/25/2020   IR INTRA CRAN STENT  08/25/2020   IR TRANSCATH/EMBOLIZ  08/25/2020   IR US GUIDE VASC ACCESS RIGHT  08/25/2020   PARTIAL HYSTERECTOMY   10/22/1999    Still has cervix   RADIOLOGY WITH ANESTHESIA N/A 08/25/2020   Procedure: IR WITH ANESTHESIA EMBOLIZATION;  Surgeon: Pedro Earls, MD;  Location: [place];  Service: Radiology;  Laterality: N/A;   TRIGGER FINGER RELEASE Left 12/25/2013   Procedure: LEFT THUMB TRIGGER RELEASE ;  Surgeon: Leanora Cover, MD;  Location: [place];  Service: Orthopedics;  Laterality: Left;    There were no vitals filed for this visit.   Subjective Assessment - 11/04/20 1505     Subjective Pt reports she had a good birthday and went out to dinner with family.    Currently in Pain? No/denies                   ADULT SLP TREATMENT - 11/04/20 1700       General Information   Behavior/Cognition  Alert;Cooperative      Treatment Provided   Treatment provided Cognitive-Linquistic      Cognitive-Linquistic Treatment   Treatment focused on Cognition    Skilled Treatment SLP provided training on attention strategies and provided examples of each. When asked, pt generated personal scenarios in which a strategy could be utilized. Pt participated in a grocery list organization task. Pt required minA in order to accurately place items in each category. SLP encouraged pt to select two attention strategies to practice at home as part of HEP.      Assessment / Recommendations / Plan   Plan Continue with current plan of care      Progression Toward Goals   Progression toward goals Progressing toward goals                SLP Short Term Goals - 11/04/20 1452       SLP SHORT TERM GOAL #1   Title Pt will recall 3 attention strategies to assist in increasing focus.    Time 2    Period Weeks    Status New   11/02/20   Target Date 11/18/20  SLP SHORT TERM GOAL #2   Title Pt will recall 3 memory strategies (external and/or internal) to assist in recall of important information.    Time 2    Period Weeks    Status New   11/02/20   Target Date 11/18/20      SLP SHORT TERM GOAL #3   Title Pt will read 3 prescription labels and organize corresponding pills into pill box with supervision from SLP.    Time 4    Period Weeks    Status On-going   To cont goal; goal not met   Target Date 11/18/20              SLP Long Term Goals - 11/04/20 1453       SLP LONG TERM GOAL #1   Title Pt will demonstrate use of 3 attention strategies in structured environment to increase likliehood of using in daily living.    Time 4    Period Weeks    Status New   11/02/20   Target Date 12/07/20      SLP LONG TERM GOAL #2   Title Pt will demonstrate use of memory aids to assist with recall of important information.    Time 4    Period Weeks    Status New   11/02/20   Target Date 12/07/20       SLP LONG TERM GOAL #3   Title Pt will organize medication into pill box with minA from pt family.    Time 8    Period Weeks    Status On-going      SLP LONG TERM GOAL #4   Title --              Plan - 11/04/20 1704     Clinical Impression Statement See tx note. SLP trained pt on attention strategies. Pt demonstrated an understanding for attention strategies. Updated goals. Cont. with current POC.    Speech Therapy Frequency 2x / week    Duration 8 weeks    Treatment/Interventions Functional tasks;Patient/family education;Cueing hierarchy;Environmental controls;Cognitive reorganization;Compensatory techniques;Internal/external aids;SLP instruction and feedback;Multimodal communcation approach    Potential to Achieve Goals Good    Consulted and Agree with Plan of Care Patient;Family member/caregiver    Family Member [facility] - son             Patient will benefit from skilled therapeutic intervention in order to improve the following deficits and impairments:   Cognitive communication deficit    Problem List Patient Active Problem List   Diagnosis Date Noted   Acute ischemic cerebrovascular accident (CVA) involving anterior cerebral artery territory ([place]) 09/03/2020   Acute CVA (cerebrovascular accident) ([place]) 09/01/2020   Acute ischemic stroke ([place]) 08/31/2020   Aneurysm of anterior communicating artery 08/25/2020   Aneurysm, cerebral, nonruptured 08/25/2020   Microcytic anemia 10/26/2014   Vitamin D deficiency 10/17/2014   Stenosing tenosynovitis of thumb 10/17/2014   Internal hemorrhoids 10/17/2014   History of colon polyps 10/17/2014   Osteoarthritis of carpometacarpal joints of both thumbs 04/04/2014   Pre-diabetes 08/18/2013   History of TIA (transient ischemic attack) 08/12/2013   Routine health maintenance 08/15/2010   Hyperlipidemia 08/05/2008   Obesity 07/30/2007   TOBACCO DEPENDENCE 04/19/2006    See tx note. For goals  Speech Therapy  Progress Note  Dates of Reporting Period: 10/06/2020 to present  Subjective Statement: Pt is willing to participate in sessions. She responds quickly to cues given by the SLP.   Objective: See  tx note.   Goal Update: See tx note.   Plan: To cont to address attention and memory skills to assist with management of iADLs.   Reason Skilled Services are Required: SLP rec skilled ST services to address cognitive communication impairment and maximize functional communication.    [facility]. Communication Sciences and Disorders  11/04/2020, 5:06 PM  [place]. [place], [place], [address] Phone: [phone]   Fax:  [phone]   Name: Betty Archer MRN: [id number] Date of Birth: December 18, 1960

## 2020-11-08 ENCOUNTER — Telehealth: Payer: Self-pay

## 2020-11-08 NOTE — Telephone Encounter (Signed)
Contacted pt, spoke to son per DPR, informed him that recent lipid panel showed excellent improvement of LDL or bad cholesterol currently at 49.  Please continue current dose of atorvastatin. Advised to call the office with questions, he was appreciative.

## 2020-11-08 NOTE — Telephone Encounter (Signed)
-----   Message from Frann Rider, NP sent at 11/08/2020  8:07 AM EDT ----- Please advise patient/son that recent lipid panel showed excellent improvement of LDL or bad cholesterol currently at 49.  Please continue current dose of atorvastatin.

## 2020-11-08 NOTE — Telephone Encounter (Signed)
Pt's son Betty Archer called stating [facility] called him and states NP for got to fill out page 3 questions 5 and 7. Betty Archer states if you all have any questions or need any information please give him a call.

## 2020-11-09 ENCOUNTER — Other Ambulatory Visit: Payer: Self-pay

## 2020-11-09 ENCOUNTER — Ambulatory Visit: Payer: Self-pay | Admitting: Physical Therapy

## 2020-11-09 ENCOUNTER — Ambulatory Visit: Payer: Self-pay | Admitting: Speech Pathology

## 2020-11-09 ENCOUNTER — Ambulatory Visit: Payer: Self-pay | Admitting: Occupational Therapy

## 2020-11-09 ENCOUNTER — Encounter: Payer: Self-pay | Admitting: Speech Pathology

## 2020-11-09 ENCOUNTER — Encounter: Payer: Self-pay | Admitting: Physical Therapy

## 2020-11-09 DIAGNOSIS — R2689 Other abnormalities of gait and mobility: Secondary | ICD-10-CM

## 2020-11-09 DIAGNOSIS — M6281 Muscle weakness (generalized): Secondary | ICD-10-CM

## 2020-11-09 DIAGNOSIS — R41841 Cognitive communication deficit: Secondary | ICD-10-CM

## 2020-11-09 DIAGNOSIS — I69354 Hemiplegia and hemiparesis following cerebral infarction affecting left non-dominant side: Secondary | ICD-10-CM

## 2020-11-09 DIAGNOSIS — M25672 Stiffness of left ankle, not elsewhere classified: Secondary | ICD-10-CM

## 2020-11-09 DIAGNOSIS — R2681 Unsteadiness on feet: Secondary | ICD-10-CM

## 2020-11-09 NOTE — Therapy (Signed)
[place]. [place], [place], [address] Phone: [phone]   Fax:  [phone]  Speech Language Pathology Treatment  Patient Details  Name: Betty Archer MRN: [id number] Date of Birth: 1960/11/06 Referring Provider (SLP): Reesa Chew [place]   Encounter Date: 11/09/2020   End of Session - 11/09/20 1409     Visit Number 11    Number of Visits 17    Date for SLP Re-Evaluation 12/07/20    SLP Start Time 1962    SLP Stop Time  1435    SLP Time Calculation (min) 40 min    Activity Tolerance Patient tolerated treatment well             Past Medical History:  Diagnosis Date   Hyperlipidemia    Stroke [facility])     Past Surgical History:  Procedure Laterality Date   BREAST BIOPSY      Core biopsy done on April 2003   COLONOSCOPY     IR 3D INDEPENDENT WKST  08/25/2020   IR 3D INDEPENDENT WKST  08/25/2020   IR ANGIO INTRA EXTRACRAN SEL INTERNAL CAROTID BILAT MOD SED  08/25/2020   IR ANGIO VERTEBRAL SEL VERTEBRAL BILAT MOD SED  08/25/2020   IR CT HEAD LTD  08/25/2020   IR INTRA CRAN STENT  08/25/2020   IR TRANSCATH/EMBOLIZ  08/25/2020   IR US GUIDE VASC ACCESS RIGHT  08/25/2020   PARTIAL HYSTERECTOMY   10/22/1999    Still has cervix   RADIOLOGY WITH ANESTHESIA N/A 08/25/2020   Procedure: IR WITH ANESTHESIA EMBOLIZATION;  Surgeon: Pedro Earls, MD;  Location: [place];  Service: Radiology;  Laterality: N/A;   TRIGGER FINGER RELEASE Left 12/25/2013   Procedure: LEFT THUMB TRIGGER RELEASE ;  Surgeon: Leanora Cover, MD;  Location: [place];  Service: Orthopedics;  Laterality: Left;    There were no vitals filed for this visit.   Subjective Assessment - 11/09/20 1408     Subjective Pt shared she had a family cookout to celebrate her birthday this weekend.    Currently in Pain? No/denies                   ADULT SLP TREATMENT - 11/09/20 1441       General Information   Behavior/Cognition  Cooperative;Pleasant mood      Treatment Provided   Treatment provided Cognitive-Linquistic      Cognitive-Linquistic Treatment   Treatment focused on Cognition    Skilled Treatment SLP trained pt on chunking, a memory strategy, in order to faciliate pt memory in iADLs. Pt was instructed to recall 6 digit numbers, by chunking them into two sets of 3 to faciliate recall. Pt required minA for immediate recall of each set of numbers. Pt also reviewed groups of two 9 digit numbers and was instructed to find any errors between the sets. Pt completed this with spv. Pt participated in a functional task where she used chunking to recall clothes in boxes. SLP placed three different clothing items each in two boxes. The pt then utilized chunking to recall item location. She completed this task with minA.      Assessment / Recommendations / Plan   Plan Continue with current plan of care      Progression Toward Goals   Progression toward goals Progressing toward goals                SLP Short Term Goals - 11/04/20 1452  SLP SHORT TERM GOAL #1   Title Pt will recall 3 attention strategies to assist in increasing focus.    Time 2    Period Weeks    Status New   11/02/20   Target Date 11/18/20      SLP SHORT TERM GOAL #2   Title Pt will recall 3 memory strategies (external and/or internal) to assist in recall of important information.    Time 2    Period Weeks    Status New   11/02/20   Target Date 11/18/20      SLP SHORT TERM GOAL #3   Title Pt will read 3 prescription labels and organize corresponding pills into pill box with supervision from SLP.    Time 4    Period Weeks    Status On-going   To cont goal; goal not met   Target Date 11/18/20              SLP Long Term Goals - 11/04/20 1453       SLP LONG TERM GOAL #1   Title Pt will demonstrate use of 3 attention strategies in structured environment to increase likliehood of using in daily living.    Time 4    Period  Weeks    Status New   11/02/20   Target Date 12/07/20      SLP LONG TERM GOAL #2   Title Pt will demonstrate use of memory aids to assist with recall of important information.    Time 4    Period Weeks    Status New   11/02/20   Target Date 12/07/20      SLP LONG TERM GOAL #3   Title Pt will organize medication into pill box with minA from pt family.    Time 8    Period Weeks    Status On-going      SLP LONG TERM GOAL #4   Title --              Plan - 11/09/20 1501     Clinical Impression Statement See tx note. Pt benefited from visual cueing for immediate recall of information.  Cont. with current POC.    Speech Therapy Frequency 2x / week    Duration 8 weeks    Treatment/Interventions Functional tasks;Patient/family education;Cueing hierarchy;Environmental controls;Cognitive reorganization;Compensatory techniques;Internal/external aids;SLP instruction and feedback;Multimodal communcation approach    Potential to Achieve Goals Good    Consulted and Agree with Plan of Care Patient;Family member/caregiver    Family Member [facility] - son             Patient will benefit from skilled therapeutic intervention in order to improve the following deficits and impairments:   Cognitive communication deficit    Problem List Patient Active Problem List   Diagnosis Date Noted   Acute ischemic cerebrovascular accident (CVA) involving anterior cerebral artery territory ([place]) 09/03/2020   Acute CVA (cerebrovascular accident) ([place]) 09/01/2020   Acute ischemic stroke ([place]) 08/31/2020   Aneurysm of anterior communicating artery 08/25/2020   Aneurysm, cerebral, nonruptured 08/25/2020   Microcytic anemia 10/26/2014   Vitamin D deficiency 10/17/2014   Stenosing tenosynovitis of thumb 10/17/2014   Internal hemorrhoids 10/17/2014   History of colon polyps 10/17/2014   Osteoarthritis of carpometacarpal joints of both thumbs 04/04/2014   Pre-diabetes 08/18/2013   History of  TIA (transient ischemic attack) 08/12/2013   Routine health maintenance 08/15/2010   Hyperlipidemia 08/05/2008   Obesity 07/30/2007   TOBACCO DEPENDENCE 04/19/2006  [facility]. Communication Sciences and Disorders  11/09/2020, 3:10 PM  [place]. [place], [place], [address] Phone: [phone]   Fax:  [phone]   Name: Betty Archer MRN: [id number] Date of Birth: 11/03/1960

## 2020-11-09 NOTE — Therapy (Signed)
[facility]. [place], [place], [address] Phone: [phone]   Fax:  [phone]  Physical Therapy Treatment  Patient Details  Name: Betty Archer MRN: [id number] Date of Birth: 1960/08/31 Referring Provider (PT): Bary Leriche, [place]   Encounter Date: 11/09/2020   PT End of Session - 11/09/20 1354     Visit Number 8    Number of Visits 17    Date for PT Re-Evaluation 12/03/20    Authorization Type Self Pay    PT Start Time 4097    PT Stop Time 3532    PT Time Calculation (min) 40 min    Equipment Utilized During Treatment Gait belt    Activity Tolerance Patient tolerated treatment well    Behavior During Therapy [facility] for tasks assessed/performed;Flat affect             Past Medical History:  Diagnosis Date   Hyperlipidemia    Stroke [facility])     Past Surgical History:  Procedure Laterality Date   BREAST BIOPSY      Core biopsy done on April 2003   COLONOSCOPY     IR 3D INDEPENDENT WKST  08/25/2020   IR 3D INDEPENDENT WKST  08/25/2020   IR ANGIO INTRA EXTRACRAN SEL INTERNAL CAROTID BILAT MOD SED  08/25/2020   IR ANGIO VERTEBRAL SEL VERTEBRAL BILAT MOD SED  08/25/2020   IR CT HEAD LTD  08/25/2020   IR INTRA CRAN STENT  08/25/2020   IR TRANSCATH/EMBOLIZ  08/25/2020   IR US GUIDE VASC ACCESS RIGHT  08/25/2020   PARTIAL HYSTERECTOMY   10/22/1999    Still has cervix   RADIOLOGY WITH ANESTHESIA N/A 08/25/2020   Procedure: IR WITH ANESTHESIA EMBOLIZATION;  Surgeon: Pedro Earls, MD;  Location: [place];  Service: Radiology;  Laterality: N/A;   TRIGGER FINGER RELEASE Left 12/25/2013   Procedure: LEFT THUMB TRIGGER RELEASE ;  Surgeon: Leanora Cover, MD;  Location: [place];  Service: Orthopedics;  Laterality: Left;    There were no vitals filed for this visit.   Subjective Assessment - 11/09/20 1315     Subjective Went for a cookout with her family last weekend and had a good time. Has not been  doing her exercises- "I just haven't."    Pertinent History HLD, repair of right ACA aneurysm, left MCA aneurysm (unrepaired)    Diagnostic tests 09/01/20 brain MRI: Acute Right ACA territory infarct, Ischemia also in the Left ACA territory    Patient Stated Goals patient unable to verbalize goals for therapy but agreeable to work on strengthening    Currently in Pain? No/denies                               [facility] Adult PT Treatment/Exercise - 11/09/20 0001       Knee/Hip Exercises: Stretches   Gastroc Stretch Right;Left;30 seconds;2 reps    Gastroc Stretch Limitations toes on black bar      Knee/Hip Exercises: Aerobic   Recumbent Bike level 3.5 x 6 minutes      Knee/Hip Exercises: Standing   Other Standing Knee Exercises sidestepping with yellow loop around ankles 4x 105f   cues to avoid ankle eversion     Knee/Hip Exercises: Seated   Other Seated Knee/Hip Exercises L ankle DF with red TB x15, with green TB x15      Knee/Hip Exercises: Supine  Bridges with Greig Right Strengthening;Both;2 sets;10 reps    Constance Haw with Clamshell Strengthening;Both;10 reps;2 sets   green TB; cues to increase amplitude     Knee/Hip Exercises: Sidelying   Hip ABduction Strengthening;Right;Left;10 reps;2 sets    Hip ABduction Limitations quick to fatigue; cues for alignment    Hip ADduction Strengthening;Right;Left;2 sets;10 reps    Hip ADduction Limitations opposite LE elevated on bolster                     PT Education - 11/09/20 1316     Education Details educated patient on importance of HEP compliance for max benefit    Person(s) Educated Patient    Methods Explanation;Demonstration;Tactile cues;Verbal cues    Comprehension Verbalized understanding;Returned demonstration              PT Short Term Goals - 11/04/20 1551       PT SHORT TERM GOAL #1   Title Patient to be mod I/independent with initial HEP.    Status Achieved                PT Long Term Goals - 11/04/20 1551       PT LONG TERM GOAL #1   Title Patient to be mod I/independent with advanced HEP.    Status Achieved      PT LONG TERM GOAL #2   Title Patient to demonstrate B LE strength >/=4+/5.    Status Partially Met   weak in L hip flexion, adduction, ankle DF strength     PT LONG TERM GOAL #3   Title Patient to demonstrate alternating reciprocal pattern when ascending and descending stairs with good stability and 1 handrail as needed.    Status Achieved      PT LONG TERM GOAL #4   Title Patient to complete TUG in <14 sec with LRAD in order to decrease risk of falls.    Status Achieved      PT LONG TERM GOAL #5   Title Patient to demonstrate atleast -10 degrees of L ankle dorsiflexion AROM for improved gait pattern.    Status Achieved                   Plan - 11/09/20 1354     Clinical Impression Statement Patient without complaints today. Admits to poor compliance of HEP, thus educated patient on importance of consistent HEP compliance for max benefit. Patient performed progressive LE strengthening ther-ex to address remaining deficits found last session. Required cues to increase amplitude of bridges to maximize glute activation with good effort by patient. Patient became quickly fatigued with sidelying hip strengthening, particularly on L LE. Frequent manual cueing required for proper positioning and alignment as well as for TKE with sidelying exercises today. Able to tolerate increased resistance with dorsiflexion today; cues still required to decrease speed. Overall patient tolerated session very well. No complaints at end of session.    Comorbidities HLD, repair of right ACA aneurysm, left MCA aneurysm (unrepaired)    PT Treatment/Interventions ADLs/Self Care Home Management;Canalith Repostioning;Cryotherapy;Electrical Stimulation;DME Instruction;Ultrasound;Moist Heat;Gait training;Stair training;Functional mobility training;Therapeutic  activities;Therapeutic exercise;Balance training;Neuromuscular re-education;Manual techniques;Patient/family education;Passive range of motion;Dry needling;Energy conservation;Vestibular;Taping    PT Next Visit Plan continue to work on L hip flexion, adduction, ankle DF strength; ankle DF ROM    Consulted and Agree with Plan of Care Patient             Patient will benefit from skilled therapeutic intervention in order to improve  the following deficits and impairments:  Abnormal gait, Decreased coordination, Decreased range of motion, Difficulty walking, Increased fascial restricitons, Dizziness, Decreased activity tolerance, Decreased safety awareness, Pain, Decreased balance, Impaired flexibility, Improper body mechanics, Postural dysfunction, Increased edema, Decreased strength, Decreased mobility  Visit Diagnosis: Hemiplegia and hemiparesis following cerebral infarction affecting left non-dominant side (HCC)  Muscle weakness (generalized)  Stiffness of left ankle, not elsewhere classified  Other abnormalities of gait and mobility  Unsteadiness on feet     Problem List Patient Active Problem List   Diagnosis Date Noted   Acute ischemic cerebrovascular accident (CVA) involving anterior cerebral artery territory ([place]) 09/03/2020   Acute CVA (cerebrovascular accident) ([place]) 09/01/2020   Acute ischemic stroke ([place]) 08/31/2020   Aneurysm of anterior communicating artery 08/25/2020   Aneurysm, cerebral, nonruptured 08/25/2020   Microcytic anemia 10/26/2014   Vitamin D deficiency 10/17/2014   Stenosing tenosynovitis of thumb 10/17/2014   Internal hemorrhoids 10/17/2014   History of colon polyps 10/17/2014   Osteoarthritis of carpometacarpal joints of both thumbs 04/04/2014   Pre-diabetes 08/18/2013   History of TIA (transient ischemic attack) 08/12/2013   Routine health maintenance 08/15/2010   Hyperlipidemia 08/05/2008   Obesity 07/30/2007   TOBACCO DEPENDENCE 04/19/2006     Janene Harvey, PT, DPT 11/09/20 1:56 PM   [facility]. [place], [place], [address] Phone: [phone]   Fax:  [phone]  Name: NOVALEIGH KOHLMAN MRN: [id number] Date of Birth: 1960/10/13

## 2020-11-09 NOTE — Telephone Encounter (Signed)
Located form and updated. Questions 5 and 7 were addressed ( Not applicable to the pt's situation)  I have faxed to [facility], [place] [phone]. Confirmation received.

## 2020-11-11 ENCOUNTER — Ambulatory Visit: Payer: Self-pay | Admitting: Speech Pathology

## 2020-11-11 ENCOUNTER — Ambulatory Visit: Payer: Self-pay | Admitting: Physical Therapy

## 2020-11-11 ENCOUNTER — Encounter: Payer: Self-pay | Admitting: Physical Therapy

## 2020-11-11 ENCOUNTER — Other Ambulatory Visit: Payer: Self-pay

## 2020-11-11 ENCOUNTER — Encounter: Payer: Self-pay | Admitting: Speech Pathology

## 2020-11-11 DIAGNOSIS — R2689 Other abnormalities of gait and mobility: Secondary | ICD-10-CM

## 2020-11-11 DIAGNOSIS — R2681 Unsteadiness on feet: Secondary | ICD-10-CM

## 2020-11-11 DIAGNOSIS — M25672 Stiffness of left ankle, not elsewhere classified: Secondary | ICD-10-CM

## 2020-11-11 DIAGNOSIS — M6281 Muscle weakness (generalized): Secondary | ICD-10-CM

## 2020-11-11 DIAGNOSIS — R41841 Cognitive communication deficit: Secondary | ICD-10-CM

## 2020-11-11 DIAGNOSIS — I69354 Hemiplegia and hemiparesis following cerebral infarction affecting left non-dominant side: Secondary | ICD-10-CM

## 2020-11-11 NOTE — Therapy (Signed)
[facility]. [place], [place], [address] Phone: [phone]   Fax:  [phone]  Speech Language Pathology Treatment  Patient Details  Name: MISAKO ROEDER MRN: [id number] Date of Birth: [birth year] Referring Provider (SLP): Reesa Chew [place]   Encounter Date: 11/11/2020   End of Session - 11/11/20 1327     Visit Number 12    Number of Visits 17    Date for SLP Re-Evaluation 12/07/20    SLP Start Time 4132    SLP Stop Time  4401    SLP Time Calculation (min) 40 min    Activity Tolerance Patient tolerated treatment well             Past Medical History:  Diagnosis Date   Hyperlipidemia    Stroke [facility])     Past Surgical History:  Procedure Laterality Date   BREAST BIOPSY      Core biopsy done on April 2003   COLONOSCOPY     IR 3D INDEPENDENT WKST  08/25/2020   IR 3D INDEPENDENT WKST  08/25/2020   IR ANGIO INTRA EXTRACRAN SEL INTERNAL CAROTID BILAT MOD SED  08/25/2020   IR ANGIO VERTEBRAL SEL VERTEBRAL BILAT MOD SED  08/25/2020   IR CT HEAD LTD  08/25/2020   IR INTRA CRAN STENT  08/25/2020   IR TRANSCATH/EMBOLIZ  08/25/2020   IR US GUIDE VASC ACCESS RIGHT  08/25/2020   PARTIAL HYSTERECTOMY   10/22/1999    Still has cervix   RADIOLOGY WITH ANESTHESIA N/A 08/25/2020   Procedure: IR WITH ANESTHESIA EMBOLIZATION;  Surgeon: Pedro Earls, MD;  Location: [place];  Service: Radiology;  Laterality: N/A;   TRIGGER FINGER RELEASE Left 12/25/2013   Procedure: LEFT THUMB TRIGGER RELEASE ;  Surgeon: Leanora Cover, MD;  Location: [place];  Service: Orthopedics;  Laterality: Left;    There were no vitals filed for this visit.   Subjective Assessment - 11/11/20 1324     Subjective Pt reported she "doesn't have anything she wants to work on" specifically.    Currently in Pain? No/denies                   ADULT SLP TREATMENT - 11/11/20 1406       General Information   Behavior/Cognition  Cooperative;Pleasant mood      Treatment Provided   Treatment provided Cognitive-Linquistic      Cognitive-Linquistic Treatment   Treatment focused on Cognition    Skilled Treatment SLP trained pt on internal and external memory strategies to assist with memory deficits. When asked, pt provided accurate examples of utilizing each strategy in iADLs. Pt participated in a grocery list activity where she grouped ten grocery items into categories, based on the type of product. She was asked to recall items after a two minute period. Pt recalled items by category with minA. SLP made note that pt benefited from categorizing items in order to facilitate with recall .      Assessment / Recommendations / Plan   Plan Continue with current plan of care      Progression Toward Goals   Progression toward goals Progressing toward goals                SLP Short Term Goals - 11/04/20 1452       SLP SHORT TERM GOAL #1   Title Pt will recall 3 attention strategies to assist in increasing focus.    Time 2  Period Weeks    Status New   11/02/20   Target Date 11/18/20      SLP SHORT TERM GOAL #2   Title Pt will recall 3 memory strategies (external and/or internal) to assist in recall of important information.    Time 2    Period Weeks    Status New   11/02/20   Target Date 11/18/20      SLP SHORT TERM GOAL #3   Title Pt will read 3 prescription labels and organize corresponding pills into pill box with supervision from SLP.    Time 4    Period Weeks    Status On-going   To cont goal; goal not met   Target Date 11/18/20              SLP Long Term Goals - 11/04/20 1453       SLP LONG TERM GOAL #1   Title Pt will demonstrate use of 3 attention strategies in structured environment to increase likliehood of using in daily living.    Time 4    Period Weeks    Status New   11/02/20   Target Date 12/07/20      SLP LONG TERM GOAL #2   Title Pt will demonstrate use of memory aids to  assist with recall of important information.    Time 4    Period Weeks    Status New   11/02/20   Target Date 12/07/20      SLP LONG TERM GOAL #3   Title Pt will organize medication into pill box with minA from pt family.    Time 8    Period Weeks    Status On-going      SLP LONG TERM GOAL #4   Title --              Plan - 11/11/20 1417     Clinical Impression Statement See tx note. Pt was trained on internal and external memory strategies. Cont. with current POC.    Speech Therapy Frequency 2x / week    Duration 8 weeks    Treatment/Interventions Functional tasks;Patient/family education;Cueing hierarchy;Environmental controls;Cognitive reorganization;Compensatory techniques;Internal/external aids;SLP instruction and feedback;Multimodal communcation approach    Potential to Achieve Goals Good    Consulted and Agree with Plan of Care Patient;Family member/caregiver    Family Member [facility] - son             Patient will benefit from skilled therapeutic intervention in order to improve the following deficits and impairments:   Cognitive communication deficit    Problem List Patient Active Problem List   Diagnosis Date Noted   Acute ischemic cerebrovascular accident (CVA) involving anterior cerebral artery territory ([place]) 09/03/2020   Acute CVA (cerebrovascular accident) ([place]) 09/01/2020   Acute ischemic stroke ([place]) 08/31/2020   Aneurysm of anterior communicating artery 08/25/2020   Aneurysm, cerebral, nonruptured 08/25/2020   Microcytic anemia 10/26/2014   Vitamin D deficiency 10/17/2014   Stenosing tenosynovitis of thumb 10/17/2014   Internal hemorrhoids 10/17/2014   History of colon polyps 10/17/2014   Osteoarthritis of carpometacarpal joints of both thumbs 04/04/2014   Pre-diabetes 08/18/2013   History of TIA (transient ischemic attack) 08/12/2013   Routine health maintenance 08/15/2010   Hyperlipidemia 08/05/2008   Obesity 07/30/2007   TOBACCO  DEPENDENCE 04/19/2006    Danise Mina B.S. Communication Sciences and Disorders  11/11/2020, 2:19 PM  [facility]. [place], [place], [address]  Phone: [phone]   Fax:  [phone]   Name: JORRYN HERSHBERGER MRN: [id number] Date of Birth: 12/04/1960

## 2020-11-11 NOTE — Therapy (Signed)
[facility]. [place], [place], [address] Phone: [phone]   Fax:  [phone]  Physical Therapy Treatment  Patient Details  Name: Betty Archer MRN: [id number] Date of Birth: 10-24-1960 Referring Provider (PT): Bary Leriche, [place]   Encounter Date: 11/11/2020   PT End of Session - 11/11/20 1438     Visit Number 9    Number of Visits 17    Date for PT Re-Evaluation 12/03/20    Authorization Type Self Pay    PT Start Time 1401    PT Stop Time 3846    PT Time Calculation (min) 40 min    Equipment Utilized During Treatment Gait belt    Activity Tolerance Patient tolerated treatment well    Behavior During Therapy [facility] for tasks assessed/performed;Flat affect             Past Medical History:  Diagnosis Date   Hyperlipidemia    Stroke [facility])     Past Surgical History:  Procedure Laterality Date   BREAST BIOPSY      Core biopsy done on April 2003   COLONOSCOPY     IR 3D INDEPENDENT WKST  08/25/2020   IR 3D INDEPENDENT WKST  08/25/2020   IR ANGIO INTRA EXTRACRAN SEL INTERNAL CAROTID BILAT MOD SED  08/25/2020   IR ANGIO VERTEBRAL SEL VERTEBRAL BILAT MOD SED  08/25/2020   IR CT HEAD LTD  08/25/2020   IR INTRA CRAN STENT  08/25/2020   IR TRANSCATH/EMBOLIZ  08/25/2020   IR US GUIDE VASC ACCESS RIGHT  08/25/2020   PARTIAL HYSTERECTOMY   10/22/1999    Still has cervix   RADIOLOGY WITH ANESTHESIA N/A 08/25/2020   Procedure: IR WITH ANESTHESIA EMBOLIZATION;  Surgeon: Pedro Earls, MD;  Location: [place];  Service: Radiology;  Laterality: N/A;   TRIGGER FINGER RELEASE Left 12/25/2013   Procedure: LEFT THUMB TRIGGER RELEASE ;  Surgeon: Leanora Cover, MD;  Location: [place];  Service: Orthopedics;  Laterality: Left;    There were no vitals filed for this visit.   Subjective Assessment - 11/11/20 1402     Subjective Doing good. Forgot to bring her exercise sheet in.    Pertinent History HLD,  repair of right ACA aneurysm, left MCA aneurysm (unrepaired)    Diagnostic tests 09/01/20 brain MRI: Acute Right ACA territory infarct, Ischemia also in the Left ACA territory    Patient Stated Goals patient unable to verbalize goals for therapy but agreeable to work on strengthening    Currently in Pain? No/denies                               [facility] Adult PT Treatment/Exercise - 11/11/20 0001       Neuro Re-ed    Neuro Re-ed Details  walking on heels/toes 4x46f      Knee/Hip Exercises: Stretches   Gastroc Stretch Right;Left;30 seconds;2 reps    Gastroc Stretch Limitations toes on step      Knee/Hip Exercises: Aerobic   Nustep level 5 x 6 minutes      Knee/Hip Exercises: Machines for Strengthening   Cybex Knee Extension L LE 10# 2x10    Cybex Knee Flexion L LE 15# 2x10      Knee/Hip Exercises: Standing   Lateral Step Up Left;2 sets;10 reps;Step Height: 4";Step Height: 6";Hand Hold: 1    Lateral Step Up Limitations lateral step downs;  cues for slow step down    Other Standing Knee Exercises R/L 4 way hip with red TB 10x each and 2 ski poles                       PT Short Term Goals - 11/04/20 1551       PT SHORT TERM GOAL #1   Title Patient to be mod I/independent with initial HEP.    Status Achieved               PT Long Term Goals - 11/04/20 1551       PT LONG TERM GOAL #1   Title Patient to be mod I/independent with advanced HEP.    Status Achieved      PT LONG TERM GOAL #2   Title Patient to demonstrate B LE strength >/=4+/5.    Status Partially Met   weak in L hip flexion, adduction, ankle DF strength     PT LONG TERM GOAL #3   Title Patient to demonstrate alternating reciprocal pattern when ascending and descending stairs with good stability and 1 handrail as needed.    Status Achieved      PT LONG TERM GOAL #4   Title Patient to complete TUG in <14 sec with LRAD in order to decrease risk of falls.    Status Achieved       PT LONG TERM GOAL #5   Title Patient to demonstrate atleast -10 degrees of L ankle dorsiflexion AROM for improved gait pattern.    Status Achieved                   Plan - 11/11/20 1442     Clinical Impression Statement Patient without complaints at start of session. Progressed standing hip strengthening today. Patient required frequent cues to maintain proper form including upright posture, TKE, and muscle control. However, good stability noted. Good stability demonstrated with heel/toe walking, however required cueing to increase amplitude of movement on L LE. Step-downs and machine strengthening focused on slow and controlled eccentric phase, which patient performed fairly well. No complaints at end of session.    Comorbidities HLD, repair of right ACA aneurysm, left MCA aneurysm (unrepaired)    PT Treatment/Interventions ADLs/Self Care Home Management;Canalith Repostioning;Cryotherapy;Electrical Stimulation;DME Instruction;Ultrasound;Moist Heat;Gait training;Stair training;Functional mobility training;Therapeutic activities;Therapeutic exercise;Balance training;Neuromuscular re-education;Manual techniques;Patient/family education;Passive range of motion;Dry needling;Energy conservation;Vestibular;Taping    PT Next Visit Plan continue to work on L hip flexion, adduction, ankle DF strength; ankle DF ROM    Consulted and Agree with Plan of Care Patient             Patient will benefit from skilled therapeutic intervention in order to improve the following deficits and impairments:  Abnormal gait, Decreased coordination, Decreased range of motion, Difficulty walking, Increased fascial restricitons, Dizziness, Decreased activity tolerance, Decreased safety awareness, Pain, Decreased balance, Impaired flexibility, Improper body mechanics, Postural dysfunction, Increased edema, Decreased strength, Decreased mobility  Visit Diagnosis: Hemiplegia and hemiparesis following cerebral  infarction affecting left non-dominant side (HCC)  Muscle weakness (generalized)  Stiffness of left ankle, not elsewhere classified  Other abnormalities of gait and mobility  Unsteadiness on feet     Problem List Patient Active Problem List   Diagnosis Date Noted   Acute ischemic cerebrovascular accident (CVA) involving anterior cerebral artery territory ([place]) 09/03/2020   Acute CVA (cerebrovascular accident) ([place]) 09/01/2020   Acute ischemic stroke ([place]) 08/31/2020   Aneurysm of anterior communicating artery 08/25/2020  Aneurysm, cerebral, nonruptured 08/25/2020   Microcytic anemia 10/26/2014   Vitamin D deficiency 10/17/2014   Stenosing tenosynovitis of thumb 10/17/2014   Internal hemorrhoids 10/17/2014   History of colon polyps 10/17/2014   Osteoarthritis of carpometacarpal joints of both thumbs 04/04/2014   Pre-diabetes 08/18/2013   History of TIA (transient ischemic attack) 08/12/2013   Routine health maintenance 08/15/2010   Hyperlipidemia 08/05/2008   Obesity 07/30/2007   TOBACCO DEPENDENCE 04/19/2006    Janene Harvey, PT, DPT 11/11/20 2:43 PM   [place]. [place], [place], [address] Phone: [phone]   Fax:  [phone]  Name: Betty Archer MRN: [id number] Date of Birth: 1960/05/07

## 2020-11-15 ENCOUNTER — Ambulatory Visit: Payer: Self-pay | Admitting: Family Medicine

## 2020-11-16 ENCOUNTER — Encounter: Payer: Self-pay | Admitting: Speech Pathology

## 2020-11-16 ENCOUNTER — Ambulatory Visit: Payer: Self-pay | Admitting: Occupational Therapy

## 2020-11-16 ENCOUNTER — Other Ambulatory Visit: Payer: Self-pay

## 2020-11-16 ENCOUNTER — Encounter: Payer: Self-pay | Admitting: Physical Therapy

## 2020-11-16 ENCOUNTER — Ambulatory Visit: Payer: Self-pay | Admitting: Physical Therapy

## 2020-11-16 ENCOUNTER — Ambulatory Visit: Payer: Self-pay | Admitting: Speech Pathology

## 2020-11-16 DIAGNOSIS — R41841 Cognitive communication deficit: Secondary | ICD-10-CM

## 2020-11-16 DIAGNOSIS — M6281 Muscle weakness (generalized): Secondary | ICD-10-CM

## 2020-11-16 DIAGNOSIS — R2689 Other abnormalities of gait and mobility: Secondary | ICD-10-CM

## 2020-11-16 DIAGNOSIS — I69354 Hemiplegia and hemiparesis following cerebral infarction affecting left non-dominant side: Secondary | ICD-10-CM

## 2020-11-16 DIAGNOSIS — M25672 Stiffness of left ankle, not elsewhere classified: Secondary | ICD-10-CM

## 2020-11-16 DIAGNOSIS — R2681 Unsteadiness on feet: Secondary | ICD-10-CM

## 2020-11-16 NOTE — Telephone Encounter (Signed)
Pt's son, [place] returning a call from Hilda Blades in regards to [facility] paperwork. Can contact at [phone]

## 2020-11-16 NOTE — Therapy (Signed)
[place] [place], [place], [address] Phone: [phone]   Fax:  [phone]  Speech Language Pathology Treatment  Patient Details  Name: Betty Archer MRN: [id number] Date of Birth: 1960-10-08 Referring Provider (SLP): Reesa Chew [place]   Encounter Date: 11/16/2020   End of Session - 11/16/20 1447     Visit Number 13    Number of Visits 17    Date for SLP Re-Evaluation 12/07/20    SLP Start Time 4128    SLP Stop Time  1525    SLP Time Calculation (min) 40 min    Activity Tolerance Patient tolerated treatment well             Past Medical History:  Diagnosis Date   Hyperlipidemia    Stroke [facility])     Past Surgical History:  Procedure Laterality Date   BREAST BIOPSY      Core biopsy done on April 2003   COLONOSCOPY     IR 3D INDEPENDENT WKST  08/25/2020   IR 3D INDEPENDENT WKST  08/25/2020   IR ANGIO INTRA EXTRACRAN SEL INTERNAL CAROTID BILAT MOD SED  08/25/2020   IR ANGIO VERTEBRAL SEL VERTEBRAL BILAT MOD SED  08/25/2020   IR CT HEAD LTD  08/25/2020   IR INTRA CRAN STENT  08/25/2020   IR TRANSCATH/EMBOLIZ  08/25/2020   IR US GUIDE VASC ACCESS RIGHT  08/25/2020   PARTIAL HYSTERECTOMY   10/22/1999    Still has cervix   RADIOLOGY WITH ANESTHESIA N/A 08/25/2020   Procedure: IR WITH ANESTHESIA EMBOLIZATION;  Surgeon: Pedro Earls, MD;  Location: [facility];  Service: Radiology;  Laterality: N/A;   TRIGGER FINGER RELEASE Left 12/25/2013   Procedure: LEFT THUMB TRIGGER RELEASE ;  Surgeon: Leanora Cover, MD;  Location: [facility];  Service: Orthopedics;  Laterality: Left;    There were no vitals filed for this visit.   Subjective Assessment - 11/16/20 1446     Subjective Pt reports that she is doing well and her memory is "about the same".    Currently in Pain? Yes    Pain Score 6     Pain Location Hip    Pain Orientation Left    Pain Descriptors / Indicators Sore    Pain Type Acute pain     Pain Onset More than a month ago    Pain Frequency Intermittent                   ADULT SLP TREATMENT - 11/16/20 1700       General Information   Behavior/Cognition Cooperative;Pleasant mood      Treatment Provided   Treatment provided Cognitive-Linquistic      Cognitive-Linquistic Treatment   Treatment focused on Cognition    Skilled Treatment Pt participated in executive function tasks targeting problem solving and reasoning. Pt was instructed to utilize active listening attention strategies (clarification, ask questions, summarizing, and paraphrasing) in order to complete these tasks. Pt was encouraged to ask for clarification if confucion occrred. Pt utilized active listening skills with modA. SLP noted that pt presents difficulty with initiating and requesting more information as needed. SLP also noted that pt benefited from looking at handout with active listening strategies while participating in this task.      Assessment / Recommendations / Plan   Plan Continue with current plan of care      Progression Toward Goals   Progression toward goals Progressing toward  goals                SLP Short Term Goals - 11/04/20 1452       SLP SHORT TERM GOAL #1   Title Pt will recall 3 attention strategies to assist in increasing focus.    Time 2    Period Weeks    Status New   11/02/20   Target Date 11/18/20      SLP SHORT TERM GOAL #2   Title Pt will recall 3 memory strategies (external and/or internal) to assist in recall of important information.    Time 2    Period Weeks    Status New   11/02/20   Target Date 11/18/20      SLP SHORT TERM GOAL #3   Title Pt will read 3 prescription labels and organize corresponding pills into pill box with supervision from SLP.    Time 4    Period Weeks    Status On-going   To cont goal; goal not met   Target Date 11/18/20              SLP Long Term Goals - 11/04/20 1453       SLP LONG TERM GOAL #1   Title Pt  will demonstrate use of 3 attention strategies in structured environment to increase likliehood of using in daily living.    Time 4    Period Weeks    Status New   11/02/20   Target Date 12/07/20      SLP LONG TERM GOAL #2   Title Pt will demonstrate use of memory aids to assist with recall of important information.    Time 4    Period Weeks    Status New   11/02/20   Target Date 12/07/20      SLP LONG TERM GOAL #3   Title Pt will organize medication into pill box with minA from pt family.    Time 8    Period Weeks    Status On-going      SLP LONG TERM GOAL #4   Title --              Plan - 11/16/20 1705     Clinical Impression Statement See tx note. Pt presents with difficulty to initiate and respond to questions when confusion arises. SLP encouraged pt to ask for clarification when difficulty in conversation occurs. Cont. with current POC.    Speech Therapy Frequency 2x / week    Duration 8 weeks    Treatment/Interventions Functional tasks;Patient/family education;Cueing hierarchy;Environmental controls;Cognitive reorganization;Compensatory techniques;Internal/external aids;SLP instruction and feedback;Multimodal communcation approach    Potential to Achieve Goals Good    Consulted and Agree with Plan of Care Patient;Family member/caregiver    Family Member [facility] - son             Patient will benefit from skilled therapeutic intervention in order to improve the following deficits and impairments:   Cognitive communication deficit    Problem List Patient Active Problem List   Diagnosis Date Noted   Acute ischemic cerebrovascular accident (CVA) involving anterior cerebral artery territory ([place]) 09/03/2020   Acute CVA (cerebrovascular accident) ([place]) 09/01/2020   Acute ischemic stroke ([place]) 08/31/2020   Aneurysm of anterior communicating artery 08/25/2020   Aneurysm, cerebral, nonruptured 08/25/2020   Microcytic anemia 10/26/2014   Vitamin D  deficiency 10/17/2014   Stenosing tenosynovitis of thumb 10/17/2014   Internal hemorrhoids 10/17/2014   History of colon polyps 10/17/2014  Osteoarthritis of carpometacarpal joints of both thumbs 04/04/2014   Pre-diabetes 08/18/2013   History of TIA (transient ischemic attack) 08/12/2013   Routine health maintenance 08/15/2010   Hyperlipidemia 08/05/2008   Obesity 07/30/2007   TOBACCO DEPENDENCE 04/19/2006    Danise Mina  B.S. Communication Sciences and Disorders  11/16/2020, 5:07 PM  [facility] [place], [place], [address] Phone: [phone]   Fax:  [phone]   Name: Betty Archer MRN: [id number] Date of Birth: April 30, 1960

## 2020-11-16 NOTE — Therapy (Signed)
[facility] [place], [place], [address] Phone: [phone]   Fax:  [phone]  Physical Therapy Treatment  Patient Details  Name: Betty Archer MRN: [id number] Date of Birth: [birth year] Referring Provider (PT): Bary Leriche, [place]   Encounter Date: 11/16/2020   PT End of Session - 11/16/20 1441     Visit Number 10    Number of Visits 17    Date for PT Re-Evaluation 12/03/20    Authorization Type Self Pay    PT Start Time 7282    PT Stop Time 0601    PT Time Calculation (min) 37 min    Activity Tolerance Patient tolerated treatment well    Behavior During Therapy [facility] for tasks assessed/performed             Past Medical History:  Diagnosis Date   Hyperlipidemia    Stroke [facility])     Past Surgical History:  Procedure Laterality Date   BREAST BIOPSY      Core biopsy done on April 2003   COLONOSCOPY     IR 3D INDEPENDENT WKST  08/25/2020   IR 3D INDEPENDENT WKST  08/25/2020   IR ANGIO INTRA EXTRACRAN SEL INTERNAL CAROTID BILAT MOD SED  08/25/2020   IR ANGIO VERTEBRAL SEL VERTEBRAL BILAT MOD SED  08/25/2020   IR CT HEAD LTD  08/25/2020   IR INTRA CRAN STENT  08/25/2020   IR TRANSCATH/EMBOLIZ  08/25/2020   IR US GUIDE VASC ACCESS RIGHT  08/25/2020   PARTIAL HYSTERECTOMY   10/22/1999    Still has cervix   RADIOLOGY WITH ANESTHESIA N/A 08/25/2020   Procedure: IR WITH ANESTHESIA EMBOLIZATION;  Surgeon: Pedro Earls, MD;  Location: [facility];  Service: Radiology;  Laterality: N/A;   TRIGGER FINGER RELEASE Left 12/25/2013   Procedure: LEFT THUMB TRIGGER RELEASE ;  Surgeon: Leanora Cover, MD;  Location: [place];  Service: Orthopedics;  Laterality: Left;    There were no vitals filed for this visit.   Subjective Assessment - 11/16/20 1408     Subjective Had a good weekend. L hip is hurting- has been for the past 2 weeks.    Pertinent History HLD, repair of right ACA aneurysm, left MCA aneurysm  (unrepaired)    Diagnostic tests 09/01/20 brain MRI: Acute Right ACA territory infarct, Ischemia also in the Left ACA territory    Patient Stated Goals patient unable to verbalize goals for therapy but agreeable to work on strengthening    Currently in Pain? Yes    Pain Score 8     Pain Location Hip    Pain Orientation Left    Pain Descriptors / Indicators Sore    Pain Type Acute pain                               [facility] Adult PT Treatment/Exercise - 11/16/20 0001       Knee/Hip Exercises: Stretches   Hip Flexor Stretch Left;2 reps;30 seconds    Hip Flexor Stretch Limitations mod thomas with strap   cues to avoid pushing into pain   ITB Stretch Left;2 reps;30 seconds    ITB Stretch Limitations supine with strap   cues for proper positioning   Piriformis Stretch Left;2 reps;30 seconds    Piriformis Stretch Limitations supine KTOS and fig 4   c/o mild pain in fig 4; did not feel stretch in KTOS  Gastroc Stretch Right;Left;30 seconds;2 reps    Press photographer Limitations runner's stretch      Knee/Hip Exercises: Aerobic   Recumbent Bike level 3.5 x 6 minutes      Knee/Hip Exercises: Standing   Heel Raises Both;10 reps;1 set    Heel Raises Limitations L/R single leg heel raise at counter   cues to avoid excessive UE support   Functional Squat 2 sets;10 reps    Functional Squat Limitations squat to elevated mat standing on airex    Other Standing Knee Exercises walking on toes/heels 4x 72f      Knee/Hip Exercises: Supine   Bridges Strengthening;Both;2 sets;10 reps    Bridges Limitations straight leg bridge   decreased amplitude, core instability                      PT Short Term Goals - 11/04/20 1551       PT SHORT TERM GOAL #1   Title Patient to be mod I/independent with initial HEP.    Status Achieved               PT Long Term Goals - 11/04/20 1551       PT LONG TERM GOAL #1   Title Patient to be mod I/independent with  advanced HEP.    Status Achieved      PT LONG TERM GOAL #2   Title Patient to demonstrate B LE strength >/=4+/5.    Status Partially Met   weak in L hip flexion, adduction, ankle DF strength     PT LONG TERM GOAL #3   Title Patient to demonstrate alternating reciprocal pattern when ascending and descending stairs with good stability and 1 handrail as needed.    Status Achieved      PT LONG TERM GOAL #4   Title Patient to complete TUG in <14 sec with LRAD in order to decrease risk of falls.    Status Achieved      PT LONG TERM GOAL #5   Title Patient to demonstrate atleast -10 degrees of L ankle dorsiflexion AROM for improved gait pattern.    Status Achieved                   Plan - 11/16/20 1444     Clinical Impression Statement Patient arrived to session with report of L hip pain without known cause. Upon palpation, patient without tenderness or significant soft tissue restriction, thus encouraged patient to try moist heat at home for 10 min at a time. Worked on gentle hip stretching to improve mobility and address patient's complaints. Consistent cueing and correction of positioning required to attain intended stretch. Reported improvement in pain after stretching. Core instability evident with more challenging bridging activities. Worked on squat form on compliant surface, with patient demonstrating R>L muscle shaking and mild ankle instability. Patient tolerated session well and without complaints at end of session.    Comorbidities HLD, repair of right ACA aneurysm, left MCA aneurysm (unrepaired)    PT Treatment/Interventions ADLs/Self Care Home Management;Canalith Repostioning;Cryotherapy;Electrical Stimulation;DME Instruction;Ultrasound;Moist Heat;Gait training;Stair training;Functional mobility training;Therapeutic activities;Therapeutic exercise;Balance training;Neuromuscular re-education;Manual techniques;Patient/family education;Passive range of motion;Dry needling;Energy  conservation;Vestibular;Taping    PT Next Visit Plan continue to work on L hip flexion, adduction, ankle DF strength; ankle DF ROM    Consulted and Agree with Plan of Care Patient             Patient will benefit from skilled therapeutic intervention in order to improve  the following deficits and impairments:  Abnormal gait, Decreased coordination, Decreased range of motion, Difficulty walking, Increased fascial restricitons, Dizziness, Decreased activity tolerance, Decreased safety awareness, Pain, Decreased balance, Impaired flexibility, Improper body mechanics, Postural dysfunction, Increased edema, Decreased strength, Decreased mobility  Visit Diagnosis: Hemiplegia and hemiparesis following cerebral infarction affecting left non-dominant side (HCC)  Muscle weakness (generalized)  Stiffness of left ankle, not elsewhere classified  Other abnormalities of gait and mobility  Unsteadiness on feet     Problem List Patient Active Problem List   Diagnosis Date Noted   Acute ischemic cerebrovascular accident (CVA) involving anterior cerebral artery territory ([place]) 09/03/2020   Acute CVA (cerebrovascular accident) ([place]) 09/01/2020   Acute ischemic stroke ([place]) 08/31/2020   Aneurysm of anterior communicating artery 08/25/2020   Aneurysm, cerebral, nonruptured 08/25/2020   Microcytic anemia 10/26/2014   Vitamin D deficiency 10/17/2014   Stenosing tenosynovitis of thumb 10/17/2014   Internal hemorrhoids 10/17/2014   History of colon polyps 10/17/2014   Osteoarthritis of carpometacarpal joints of both thumbs 04/04/2014   Pre-diabetes 08/18/2013   History of TIA (transient ischemic attack) 08/12/2013   Routine health maintenance 08/15/2010   Hyperlipidemia 08/05/2008   Obesity 07/30/2007   TOBACCO DEPENDENCE 04/19/2006     Janene Harvey, PT, DPT 11/16/20 2:47 PM   [place]. [place], [place],  [address] Phone: [phone]   Fax:  [phone]  Name: Betty Archer MRN: [id number] Date of Birth: 08/23/1960

## 2020-11-18 ENCOUNTER — Other Ambulatory Visit: Payer: Self-pay

## 2020-11-18 ENCOUNTER — Encounter: Payer: Self-pay | Admitting: Speech Pathology

## 2020-11-18 ENCOUNTER — Encounter: Payer: Self-pay | Admitting: Physical Therapy

## 2020-11-18 ENCOUNTER — Ambulatory Visit: Payer: Self-pay | Admitting: Physical Therapy

## 2020-11-18 ENCOUNTER — Ambulatory Visit: Payer: Self-pay | Admitting: Speech Pathology

## 2020-11-18 DIAGNOSIS — R2681 Unsteadiness on feet: Secondary | ICD-10-CM

## 2020-11-18 DIAGNOSIS — I69354 Hemiplegia and hemiparesis following cerebral infarction affecting left non-dominant side: Secondary | ICD-10-CM

## 2020-11-18 DIAGNOSIS — R41841 Cognitive communication deficit: Secondary | ICD-10-CM

## 2020-11-18 DIAGNOSIS — R2689 Other abnormalities of gait and mobility: Secondary | ICD-10-CM

## 2020-11-18 DIAGNOSIS — M6281 Muscle weakness (generalized): Secondary | ICD-10-CM

## 2020-11-18 DIAGNOSIS — M25672 Stiffness of left ankle, not elsewhere classified: Secondary | ICD-10-CM

## 2020-11-18 NOTE — Therapy (Signed)
[facility] [place], [place], [address] Phone: [phone]   Fax:  [phone]  Physical Therapy Treatment  Patient Details  Name: Betty Archer MRN: [id number] Date of Birth: 14-Jun-1960 Referring Provider (PT): Bary Leriche, [place]   Encounter Date: 11/18/2020   PT End of Session - 11/18/20 1443     Visit Number 11    Number of Visits 17    Date for PT Re-Evaluation 12/03/20    Authorization Type Self Pay    PT Start Time 1400    PT Stop Time 7262    PT Time Calculation (min) 41 min    Activity Tolerance Patient tolerated treatment well    Behavior During Therapy [facility] for tasks assessed/performed             Past Medical History:  Diagnosis Date   Hyperlipidemia    Stroke [facility])     Past Surgical History:  Procedure Laterality Date   BREAST BIOPSY      Core biopsy done on April 2003   COLONOSCOPY     IR 3D INDEPENDENT WKST  08/25/2020   IR 3D INDEPENDENT WKST  08/25/2020   IR ANGIO INTRA EXTRACRAN SEL INTERNAL CAROTID BILAT MOD SED  08/25/2020   IR ANGIO VERTEBRAL SEL VERTEBRAL BILAT MOD SED  08/25/2020   IR CT HEAD LTD  08/25/2020   IR INTRA CRAN STENT  08/25/2020   IR TRANSCATH/EMBOLIZ  08/25/2020   IR US GUIDE VASC ACCESS RIGHT  08/25/2020   PARTIAL HYSTERECTOMY   10/22/1999    Still has cervix   RADIOLOGY WITH ANESTHESIA N/A 08/25/2020   Procedure: IR WITH ANESTHESIA EMBOLIZATION;  Surgeon: Pedro Earls, MD;  Location: [facility];  Service: Radiology;  Laterality: N/A;   TRIGGER FINGER RELEASE Left 12/25/2013   Procedure: LEFT THUMB TRIGGER RELEASE ;  Surgeon: Leanora Cover, MD;  Location: [facility];  Service: Orthopedics;  Laterality: Left;    There were no vitals filed for this visit.   Subjective Assessment - 11/18/20 1403     Subjective Hip is better but still sore.    Pertinent History HLD, repair of right ACA aneurysm, left MCA aneurysm (unrepaired)    Diagnostic tests  09/01/20 brain MRI: Acute Right ACA territory infarct, Ischemia also in the Left ACA territory    Patient Stated Goals patient unable to verbalize goals for therapy but agreeable to work on strengthening    Currently in Pain? Yes    Pain Score 6     Pain Location Hip    Pain Orientation Left    Pain Descriptors / Indicators Sore    Pain Type Acute pain                               [facility] Adult PT Treatment/Exercise - 11/18/20 0001       Neuro Re-ed    Neuro Re-ed Details  romberg and 1/2 tandem on foam + red medball throw/catch x5 min; R/L fwd/back step with one foot on rocker board 10x   no UE use     Knee/Hip Exercises: Stretches   Hip Flexor Stretch Left;2 reps;30 seconds    Hip Flexor Stretch Limitations mod thomas with strap   assisted patient in finding point of stretch but not pushing to pain   Other Knee/Hip Stretches butterfly stretch 2x30" with gentle self-OP      Knee/Hip  Exercises: Aerobic   Recumbent Bike level 3.5 x 6 minutes      Knee/Hip Exercises: Standing   Functional Squat 1 set;10 reps    Functional Squat Limitations squat to elevated mat standing on airex with blue medball at chest    Other Standing Knee Exercises sidestepping with red and green TB around ankles 4x 29f   cues to maintain neutral shoulders     Knee/Hip Exercises: Supine   Bridges Strengthening;Both;10 reps;1 set    Bridges Limitations straight leg bridge   core instability     Knee/Hip Exercises: Sidelying   Hip ABduction Strengthening;Right;Left;10 reps;2 sets    Hip ABduction Limitations 1#; cues for alignment    Hip ADduction Strengthening;Right;Left;2 sets;10 reps    Hip ADduction Limitations 1#; opposite LE elevated on bolster                       PT Short Term Goals - 11/04/20 1551       PT SHORT TERM GOAL #1   Title Patient to be mod I/independent with initial HEP.    Status Achieved               PT Long Term Goals - 11/04/20 1551        PT LONG TERM GOAL #1   Title Patient to be mod I/independent with advanced HEP.    Status Achieved      PT LONG TERM GOAL #2   Title Patient to demonstrate B LE strength >/=4+/5.    Status Partially Met   weak in L hip flexion, adduction, ankle DF strength     PT LONG TERM GOAL #3   Title Patient to demonstrate alternating reciprocal pattern when ascending and descending stairs with good stability and 1 handrail as needed.    Status Achieved      PT LONG TERM GOAL #4   Title Patient to complete TUG in <14 sec with LRAD in order to decrease risk of falls.    Status Achieved      PT LONG TERM GOAL #5   Title Patient to demonstrate atleast -10 degrees of L ankle dorsiflexion AROM for improved gait pattern.    Status Achieved                   Plan - 11/18/20 1444     Clinical Impression Statement Patient arrived to session with report of remaining but improved L hip pain.  Continued working on LE strengthening, focusing on hips. Cueing for alignment and assistance in finding point of stretch while not pushing to pain required with ther-ex. Patient was able to demonstrate good stability with standing exercises, even with compliant surface challenges. Intermittently required cues to avoid UE support d/t some hesitation. Patient tolerated session well and without complaints at end of session.    Comorbidities HLD, repair of right ACA aneurysm, left MCA aneurysm (unrepaired)    PT Treatment/Interventions ADLs/Self Care Home Management;Canalith Repostioning;Cryotherapy;Electrical Stimulation;DME Instruction;Ultrasound;Moist Heat;Gait training;Stair training;Functional mobility training;Therapeutic activities;Therapeutic exercise;Balance training;Neuromuscular re-education;Manual techniques;Patient/family education;Passive range of motion;Dry needling;Energy conservation;Vestibular;Taping    PT Next Visit Plan continue to work on L hip flexion, adduction, ankle DF strength; ankle  DF ROM    Consulted and Agree with Plan of Care Patient             Patient will benefit from skilled therapeutic intervention in order to improve the following deficits and impairments:  Abnormal gait, Decreased coordination, Decreased range of motion, Difficulty  walking, Increased fascial restricitons, Dizziness, Decreased activity tolerance, Decreased safety awareness, Pain, Decreased balance, Impaired flexibility, Improper body mechanics, Postural dysfunction, Increased edema, Decreased strength, Decreased mobility  Visit Diagnosis: Hemiplegia and hemiparesis following cerebral infarction affecting left non-dominant side (HCC)  Muscle weakness (generalized)  Stiffness of left ankle, not elsewhere classified  Other abnormalities of gait and mobility  Unsteadiness on feet     Problem List Patient Active Problem List   Diagnosis Date Noted   Acute ischemic cerebrovascular accident (CVA) involving anterior cerebral artery territory ([place]) 09/03/2020   Acute CVA (cerebrovascular accident) ([place]) 09/01/2020   Acute ischemic stroke ([place]) 08/31/2020   Aneurysm of anterior communicating artery 08/25/2020   Aneurysm, cerebral, nonruptured 08/25/2020   Microcytic anemia 10/26/2014   Vitamin D deficiency 10/17/2014   Stenosing tenosynovitis of thumb 10/17/2014   Internal hemorrhoids 10/17/2014   History of colon polyps 10/17/2014   Osteoarthritis of carpometacarpal joints of both thumbs 04/04/2014   Pre-diabetes 08/18/2013   History of TIA (transient ischemic attack) 08/12/2013   Routine health maintenance 08/15/2010   Hyperlipidemia 08/05/2008   Obesity 07/30/2007   TOBACCO DEPENDENCE 04/19/2006    Janene Harvey, PT, DPT 11/18/20 2:45 PM   [place]. [place], [place], [address] Phone: [phone]   Fax:  [phone]  Name: Betty Archer MRN: [id number] Date of Birth: 04-Jul-1960

## 2020-11-18 NOTE — Therapy (Signed)
[place] [place], [place], [address] Phone: [phone]   Fax:  [phone]  Speech Language Pathology Treatment  Patient Details  Name: Betty Archer MRN: [id number] Date of Birth: 1960/07/30 Referring Provider (SLP): Reesa Chew [place]   Encounter Date: 11/18/2020   End of Session - 11/18/20 1318     Visit Number 14    Number of Visits 17    Date for SLP Re-Evaluation 12/07/20    SLP Start Time 6122    SLP Stop Time  4497    SLP Time Calculation (min) 40 min    Activity Tolerance Patient tolerated treatment well             Past Medical History:  Diagnosis Date   Hyperlipidemia    Stroke [facility])     Past Surgical History:  Procedure Laterality Date   BREAST BIOPSY      Core biopsy done on April 2003   COLONOSCOPY     IR 3D INDEPENDENT WKST  08/25/2020   IR 3D INDEPENDENT WKST  08/25/2020   IR ANGIO INTRA EXTRACRAN SEL INTERNAL CAROTID BILAT MOD SED  08/25/2020   IR ANGIO VERTEBRAL SEL VERTEBRAL BILAT MOD SED  08/25/2020   IR CT HEAD LTD  08/25/2020   IR INTRA CRAN STENT  08/25/2020   IR TRANSCATH/EMBOLIZ  08/25/2020   IR US GUIDE VASC ACCESS RIGHT  08/25/2020   PARTIAL HYSTERECTOMY   10/22/1999    Still has cervix   RADIOLOGY WITH ANESTHESIA N/A 08/25/2020   Procedure: IR WITH ANESTHESIA EMBOLIZATION;  Surgeon: Pedro Earls, MD;  Location: [place];  Service: Radiology;  Laterality: N/A;   TRIGGER FINGER RELEASE Left 12/25/2013   Procedure: LEFT THUMB TRIGGER RELEASE ;  Surgeon: Leanora Cover, MD;  Location: [place];  Service: Orthopedics;  Laterality: Left;    There were no vitals filed for this visit.   Subjective Assessment - 11/18/20 1318     Subjective Pt does not report any changes.    Currently in Pain? No/denies                   ADULT SLP TREATMENT - 11/18/20 1323       General Information   Behavior/Cognition Cooperative;Pleasant mood      Treatment  Provided   Treatment provided Cognitive-Linquistic      Cognitive-Linquistic Treatment   Treatment focused on Cognition    Skilled Treatment SLP assessed pt to determine achievement of STGs. STG1: Pt unable to recall attention strategies. STG 2:  Pt was able to recall 1/4 memory strategies (WRAP) independently. She required mod-to-maxA to recall other 3 strategies. STG3: Pt required minA to complete. She did not recognize her error until SLP prompted with "how many of the purple pills should be in AM and how many should be in PM?" Pt was able to answer and then correct. - When provided with a statement and asked to generate a question, pt demonstrated difficulty with initiation/processing. She benefited from [facility]- question visual; however, required minA overall.      Assessment / Recommendations / Plan   Plan Continue with current plan of care      Progression Toward Goals   Progression toward goals Progressing toward goals                SLP Short Term Goals - 11/18/20 1320       SLP SHORT TERM GOAL #1  Title Pt will recall 3 attention strategies to assist in increasing focus.    Time 2    Period Weeks    Status Not Met   11/02/20   Target Date 11/18/20      SLP SHORT TERM GOAL #2   Title Pt will recall 3 memory strategies (external and/or internal) to assist in recall of important information.    Time 2    Period Weeks    Status Not Met   11/02/20   Target Date 11/18/20      SLP SHORT TERM GOAL #3   Title Pt will read 3 prescription labels and organize corresponding pills into pill box with supervision from SLP.    Time 4    Period Weeks    Status Achieved   To cont goal; goal not met   Target Date 11/18/20              SLP Long Term Goals - 11/04/20 1453       SLP LONG TERM GOAL #1   Title Pt will demonstrate use of 3 attention strategies in structured environment to increase likliehood of using in daily living.    Time 4    Period Weeks    Status New    11/02/20   Target Date 12/07/20      SLP LONG TERM GOAL #2   Title Pt will demonstrate use of memory aids to assist with recall of important information.    Time 4    Period Weeks    Status New   11/02/20   Target Date 12/07/20      SLP LONG TERM GOAL #3   Title Pt will organize medication into pill box with minA from pt family.    Time 8    Period Weeks    Status On-going      SLP LONG TERM GOAL #4   Title --              Plan - 11/18/20 1319     Clinical Impression Statement See tx note. When asked, pt reports she requires more time to "think" what to say as opposed to difficulty understanding the question. Pt presents with difficulty to initiate and respond to questions when confusion arises. SLP encouraged pt to ask for clarification when difficulty in conversation occurs. Cont. with current POC.    Speech Therapy Frequency 2x / week    Duration 8 weeks    Treatment/Interventions Functional tasks;Patient/family education;Cueing hierarchy;Environmental controls;Cognitive reorganization;Compensatory techniques;Internal/external aids;SLP instruction and feedback;Multimodal communcation approach    Potential to Achieve Goals Good    Consulted and Agree with Plan of Care Patient;Family member/caregiver    Family Member [facility] - son             Patient will benefit from skilled therapeutic intervention in order to improve the following deficits and impairments:   Cognitive communication deficit    Problem List Patient Active Problem List   Diagnosis Date Noted   Acute ischemic cerebrovascular accident (CVA) involving anterior cerebral artery territory ([place]) 09/03/2020   Acute CVA (cerebrovascular accident) ([place]) 09/01/2020   Acute ischemic stroke ([place]) 08/31/2020   Aneurysm of anterior communicating artery 08/25/2020   Aneurysm, cerebral, nonruptured 08/25/2020   Microcytic anemia 10/26/2014   Vitamin D deficiency 10/17/2014   Stenosing tenosynovitis of  thumb 10/17/2014   Internal hemorrhoids 10/17/2014   History of colon polyps 10/17/2014   Osteoarthritis of carpometacarpal joints of both thumbs 04/04/2014   Pre-diabetes 08/18/2013  History of TIA (transient ischemic attack) 08/12/2013   Routine health maintenance 08/15/2010   Hyperlipidemia 08/05/2008   Obesity 07/30/2007   TOBACCO DEPENDENCE 04/19/2006    Danise Mina B.S. Communication Sciences and Disorders  11/18/2020, 2:03 PM  [address] [place], [place], [address] Phone: [phone]   Fax:  [phone]   Name: Betty Archer MRN: [id number] Date of Birth: 06-05-1960

## 2020-11-23 ENCOUNTER — Telehealth: Payer: Self-pay | Admitting: Physical Medicine & Rehabilitation

## 2020-11-23 ENCOUNTER — Other Ambulatory Visit (HOSPITAL_COMMUNITY): Payer: Self-pay | Admitting: Neuroradiology

## 2020-11-23 ENCOUNTER — Ambulatory Visit: Payer: Medicaid Other | Admitting: Speech Pathology

## 2020-11-23 ENCOUNTER — Other Ambulatory Visit: Payer: Self-pay

## 2020-11-23 ENCOUNTER — Ambulatory Visit: Payer: Medicaid Other | Admitting: Physical Therapy

## 2020-11-23 ENCOUNTER — Telehealth: Payer: Self-pay | Admitting: Adult Health

## 2020-11-23 MED ORDER — METHYLPHENIDATE HCL 10 MG PO TABS: 15.0000 mg | ORAL_TABLET | Freq: Two times a day (BID) | ORAL | 0 refills | Status: DC

## 2020-11-23 MED ORDER — TICAGRELOR 90 MG PO TABS
90.0000 mg | ORAL_TABLET | Freq: Two times a day (BID) | ORAL | 0 refills | Status: DC
  Filled 2020-11-23: qty 60, 30d supply, fill #0

## 2020-11-23 NOTE — Telephone Encounter (Signed)
Spoke with son and informed him her PCP can refill ritalin and atorvastain. Janett Billow NP advised he contact  Pedro Earls, M.D., IR for [facility].  I gave him her #. He  verbalized understanding, appreciation.

## 2020-11-23 NOTE — Telephone Encounter (Signed)
Pt's son [place] called requesting mother's refills for ticagrelor (BRILINTA) 90 MG TABS tablet [place] [facility] [place]. Methylphenidate (RITALIN) 10 MG tablet, and atorvastatin (LIPITOR) 80 MG tablet pharmacy [facility] [phone].

## 2020-11-23 NOTE — Telephone Encounter (Signed)
See phone message from [place].

## 2020-11-23 NOTE — Telephone Encounter (Signed)
PMP was Reviewed; Notes Reviewed.  Placed a call to Mr Browning [facility] son), Ritalin e-scribed, she has a scheduled appointment with Dr Naaman Plummer in November, he verbalizes understanding.  He was instructed to call her PCP and number was given regarding medication, he verbalizes understanding.

## 2020-11-23 NOTE — Telephone Encounter (Signed)
Patient is needing a refill on Brilinta and Lipitor.  Please call son.

## 2020-11-23 NOTE — Addendum Note (Signed)
Addended by: Bayard Hugger on: 11/23/2020 03:37 PM   Modules accepted: Orders

## 2020-11-25 ENCOUNTER — Other Ambulatory Visit: Payer: Self-pay

## 2020-11-25 ENCOUNTER — Ambulatory Visit: Payer: Medicaid Other | Attending: Physical Medicine and Rehabilitation | Admitting: Physical Therapy

## 2020-11-25 ENCOUNTER — Ambulatory Visit: Payer: Medicaid Other | Admitting: Speech Pathology

## 2020-11-25 ENCOUNTER — Encounter: Payer: Self-pay | Admitting: Physical Therapy

## 2020-11-25 DIAGNOSIS — I69354 Hemiplegia and hemiparesis following cerebral infarction affecting left non-dominant side: Secondary | ICD-10-CM | POA: Insufficient documentation

## 2020-11-25 DIAGNOSIS — R2681 Unsteadiness on feet: Secondary | ICD-10-CM | POA: Insufficient documentation

## 2020-11-25 DIAGNOSIS — M25672 Stiffness of left ankle, not elsewhere classified: Secondary | ICD-10-CM | POA: Insufficient documentation

## 2020-11-25 DIAGNOSIS — R2689 Other abnormalities of gait and mobility: Secondary | ICD-10-CM | POA: Insufficient documentation

## 2020-11-25 DIAGNOSIS — M6281 Muscle weakness (generalized): Secondary | ICD-10-CM | POA: Insufficient documentation

## 2020-11-25 DIAGNOSIS — R41841 Cognitive communication deficit: Secondary | ICD-10-CM | POA: Insufficient documentation

## 2020-11-25 NOTE — Therapy (Signed)
[address] [place], [place], [address] Phone: [phone]   Fax:  [phone]  Physical Therapy Treatment  Patient Details  Name: Betty Archer MRN: [id number] Date of Birth: 09/23/1960 Referring Provider (PT): Bary Leriche, [place]   Encounter Date: 11/25/2020   PT End of Session - 11/25/20 1441     Visit Number 12    Number of Visits 17    Date for PT Re-Evaluation 12/03/20    Authorization Type Self Pay    PT Start Time 1401    PT Stop Time 6378    PT Time Calculation (min) 48 min    Activity Tolerance Patient tolerated treatment well    Behavior During Therapy [facility] for tasks assessed/performed             Past Medical History:  Diagnosis Date   Hyperlipidemia    Stroke [facility])     Past Surgical History:  Procedure Laterality Date   BREAST BIOPSY      Core biopsy done on April 2003   COLONOSCOPY     IR 3D INDEPENDENT WKST  08/25/2020   IR 3D INDEPENDENT WKST  08/25/2020   IR ANGIO INTRA EXTRACRAN SEL INTERNAL CAROTID BILAT MOD SED  08/25/2020   IR ANGIO VERTEBRAL SEL VERTEBRAL BILAT MOD SED  08/25/2020   IR CT HEAD LTD  08/25/2020   IR INTRA CRAN STENT  08/25/2020   IR TRANSCATH/EMBOLIZ  08/25/2020   IR US GUIDE VASC ACCESS RIGHT  08/25/2020   PARTIAL HYSTERECTOMY   10/22/1999    Still has cervix   RADIOLOGY WITH ANESTHESIA N/A 08/25/2020   Procedure: IR WITH ANESTHESIA EMBOLIZATION;  Surgeon: Pedro Earls, MD;  Location: [place];  Service: Radiology;  Laterality: N/A;   TRIGGER FINGER RELEASE Left 12/25/2013   Procedure: LEFT THUMB TRIGGER RELEASE ;  Surgeon: Leanora Cover, MD;  Location: [place];  Service: Orthopedics;  Laterality: Left;    There were no vitals filed for this visit.   Subjective Assessment - 11/25/20 1404     Subjective Reports some L hip pain when getting into the car. Notes some benefit from stretching.    Pertinent History HLD, repair of right ACA aneurysm,  left MCA aneurysm (unrepaired)    Diagnostic tests 09/01/20 brain MRI: Acute Right ACA territory infarct, Ischemia also in the Left ACA territory    Patient Stated Goals patient unable to verbalize goals for therapy but agreeable to work on strengthening    Currently in Pain? Yes    Pain Score 7     Pain Location Hip    Pain Orientation Left    Pain Descriptors / Indicators Sore    Pain Type Acute pain                [facility] PT Assessment - 11/25/20 0001       AROM   Overall AROM Comments L DF AROM 5 deg      Strength   Overall Strength Comments R hip: Flex 5, ABD 4+, ADD 4. L hip: Flex 4, ABD 4+, ADD 4. R knee: ex 5, flex 4+. L knee: ex 4+, flex 4. R ankle: DF4+, PF 4+. L ankle: DF4, PF 4+                           [facility] Adult PT Treatment/Exercise - 11/25/20 0001       Knee/Hip  Exercises: Stretches   Hip Flexor Stretch Left;2 reps;30 seconds    Hip Flexor Stretch Limitations mod thomas with strap    Piriformis Stretch Left;2 reps;30 seconds    Piriformis Stretch Limitations supine KTOS and fig 4   cues to push to the point of stretch     Knee/Hip Exercises: Aerobic   Recumbent Bike level 3.5 x 6 minutes      Knee/Hip Exercises: Machines for Strengthening   Cybex Knee Flexion L LE 20# 2x10      Knee/Hip Exercises: Standing   Hip Flexion Stengthening;Both;2 sets;20 reps    Hip Flexion Limitations resisted march with red TB, holding onto TM rail    Hip ADduction Left;Strengthening;1 set;Right;10 reps    Hip ADduction Limitations green TB; holding onto TM rail      Knee/Hip Exercises: Seated   Other Seated Knee/Hip Exercises L ankle DF with green TB 2x15      Knee/Hip Exercises: Supine   Bridges with Ball Squeeze Strengthening;Both;2 sets;10 reps      Modalities   Modalities Moist Heat      Moist Heat Therapy   Number Minutes Moist Heat 10 Minutes    Moist Heat Location Hip   L                    PT Education - 11/25/20 1440      Education Details update to HEP; spoke to patient's son about her progress and remaining hip pain- advised to discuss this with her PCP    Person(s) Educated Patient;Child(ren)   son [place]   Methods Explanation;Demonstration;Tactile cues;Verbal cues;Handout    Comprehension Verbalized understanding;Returned demonstration              PT Short Term Goals - 11/04/20 1551       PT SHORT TERM GOAL #1   Title Patient to be mod I/independent with initial HEP.    Status Achieved               PT Long Term Goals - 11/04/20 1551       PT LONG TERM GOAL #1   Title Patient to be mod I/independent with advanced HEP.    Status Achieved      PT LONG TERM GOAL #2   Title Patient to demonstrate B LE strength >/=4+/5.    Status Partially Met   weak in L hip flexion, adduction, ankle DF strength     PT LONG TERM GOAL #3   Title Patient to demonstrate alternating reciprocal pattern when ascending and descending stairs with good stability and 1 handrail as needed.    Status Achieved      PT LONG TERM GOAL #4   Title Patient to complete TUG in <14 sec with LRAD in order to decrease risk of falls.    Status Achieved      PT LONG TERM GOAL #5   Title Patient to demonstrate atleast -10 degrees of L ankle dorsiflexion AROM for improved gait pattern.    Status Achieved                   Plan - 11/25/20 1441     Clinical Impression Statement Patient arrived to session with report of remaining L hip pain, especially when transferring into the car. Notes some benefit from stretching. Strength testing today revealed remaining L hip flexor, adductor, HS, and tibialis anterior weakness. L ankle dorsiflexion AROM has improved. Worked on progressive LE strengthening to address remaining strength  deficits. Able to increase weighted resistance with HS curls with fairly good control evident. Required cues to avoid use of momentum with resisted hip adduction. Ended session with moist heat to L  hip. Patient reported understanding of HEP given to her today and without complaints at end of session.    Comorbidities HLD, repair of right ACA aneurysm, left MCA aneurysm (unrepaired)    PT Treatment/Interventions ADLs/Self Care Home Management;Canalith Repostioning;Cryotherapy;Electrical Stimulation;DME Instruction;Ultrasound;Moist Heat;Gait training;Stair training;Functional mobility training;Therapeutic activities;Therapeutic exercise;Balance training;Neuromuscular re-education;Manual techniques;Patient/family education;Passive range of motion;Dry needling;Energy conservation;Vestibular;Taping    PT Next Visit Plan continue to work on L hip flexion, adduction, ankle DF strength; ankle DF ROM    Consulted and Agree with Plan of Care Patient             Patient will benefit from skilled therapeutic intervention in order to improve the following deficits and impairments:  Abnormal gait, Decreased coordination, Decreased range of motion, Difficulty walking, Increased fascial restricitons, Dizziness, Decreased activity tolerance, Decreased safety awareness, Pain, Decreased balance, Impaired flexibility, Improper body mechanics, Postural dysfunction, Increased edema, Decreased strength, Decreased mobility  Visit Diagnosis: Hemiplegia and hemiparesis following cerebral infarction affecting left non-dominant side (HCC)  Muscle weakness (generalized)  Stiffness of left ankle, not elsewhere classified  Other abnormalities of gait and mobility  Unsteadiness on feet     Problem List Patient Active Problem List   Diagnosis Date Noted   Acute ischemic cerebrovascular accident (CVA) involving anterior cerebral artery territory ([place]) 09/03/2020   Acute CVA (cerebrovascular accident) ([place]) 09/01/2020   Acute ischemic stroke ([place]) 08/31/2020   Aneurysm of anterior communicating artery 08/25/2020   Aneurysm, cerebral, nonruptured 08/25/2020   Microcytic anemia 10/26/2014   Vitamin D  deficiency 10/17/2014   Stenosing tenosynovitis of thumb 10/17/2014   Internal hemorrhoids 10/17/2014   History of colon polyps 10/17/2014   Osteoarthritis of carpometacarpal joints of both thumbs 04/04/2014   Pre-diabetes 08/18/2013   History of TIA (transient ischemic attack) 08/12/2013   Routine health maintenance 08/15/2010   Hyperlipidemia 08/05/2008   Obesity 07/30/2007   TOBACCO DEPENDENCE 04/19/2006    Janene Harvey, PT, DPT 11/25/20 2:51 PM   [address]. [place], [place], [address] Phone: [phone]   Fax:  [phone]  Name: Betty Archer MRN: [id number] Date of Birth: 1960/09/08

## 2020-11-27 NOTE — Progress Notes (Signed)
Patient ID: ROMELL CAVANAH, female    DOB: 01-Apr-1960  MRN: [id number]  CC: Annual Physical Exam  Subjective: Betty Archer is a 60 y.o. female who presents for annual physical exam.   Her concerns today include:  None.   Patient Active Problem List   Diagnosis Date Noted   Acute ischemic cerebrovascular accident (CVA) involving anterior cerebral artery territory ([place]) 09/03/2020   Acute CVA (cerebrovascular accident) ([place]) 09/01/2020   Acute ischemic stroke ([place]) 08/31/2020   Aneurysm of anterior communicating artery 08/25/2020   Aneurysm, cerebral, nonruptured 08/25/2020   Microcytic anemia 10/26/2014   Vitamin D deficiency 10/17/2014   Stenosing tenosynovitis of thumb 10/17/2014   Internal hemorrhoids 10/17/2014   History of colon polyps 10/17/2014   Osteoarthritis of carpometacarpal joints of both thumbs 04/04/2014   Pre-diabetes 08/18/2013   History of TIA (transient ischemic attack) 08/12/2013   Routine health maintenance 08/15/2010   Hyperlipidemia 08/05/2008   Obesity 07/30/2007   TOBACCO DEPENDENCE 04/19/2006     Current Outpatient Medications on File Prior to Visit  Medication Sig Dispense Refill   acetaminophen (TYLENOL) 325 MG tablet Take 2 tablets (650 mg total) by mouth every 6 (six) hours as needed for mild pain or headache. 100 tablet 0   aspirin 81 MG chewable tablet Chew 1 tablet (81 mg total) by mouth in the morning and at bedtime. 100 tablet 0   lidocaine (LIDODERM) 5 % Place 1 patch onto the skin daily. Apply at 8 am and remove at  pm daily. 30 patch 0   methylphenidate (RITALIN) 10 MG tablet Take 1.5 tablets (15 mg total) by mouth 2 (two) times daily with breakfast and lunch. 90 tablet 0   senna-docusate (SENOKOT-S) 8.6-50 MG tablet Take 2 tablets by mouth at bedtime. 60 tablet 0   ticagrelor (BRILINTA) 90 MG TABS tablet Take 1 tablet (90 mg total) by mouth 2 (two) times daily. 60 tablet 0   No current facility-administered medications on file prior to  visit.    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    Employer: [employer]  Tobacco Use   Smoking status: Every Day    Packs/day: 0.50    Years: 27.00    Pack years: 13.50    Types: Cigarettes   Smokeless tobacco: Never   Tobacco comments:    11/02/20 one a day  Vaping Use   Vaping Use: Never used  Substance and Sexual Activity   Alcohol use: Yes    Alcohol/week: 0.0 standard drinks    Comment:  patient is a social drinker   Drug use: No   Sexual activity: Not on file  Other Topics Concern   Not on file  Social History Narrative    She has 3 son who are healthy ,    11/02/20 lives with son, [name]    worked as a Research officer, 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   Social Determinants of Health   Financial Resource Strain: Not on file  Food Insecurity: Not on file  Transportation Needs: Not on file  Physical Activity: Not on file  Stress: Not on file  Social Connections: Not on file  Intimate Partner Violence: Not on file  Family History  Problem Relation Age of Onset   Hypertension Mother    Lung cancer Father    Breast cancer Maternal Grandmother    Breast cancer Maternal Aunt    Colon cancer Neg Hx    Esophageal cancer Neg Hx    Stomach cancer Neg Hx     Past Surgical History:  Procedure Laterality Date   BREAST BIOPSY      Core biopsy done on April 2003   COLONOSCOPY     IR 3D INDEPENDENT WKST  08/25/2020   IR 3D INDEPENDENT WKST  08/25/2020   IR ANGIO INTRA EXTRACRAN SEL INTERNAL CAROTID BILAT MOD SED  08/25/2020   IR ANGIO VERTEBRAL SEL VERTEBRAL BILAT MOD SED  08/25/2020   IR CT HEAD LTD  08/25/2020   IR INTRA CRAN STENT  08/25/2020   IR TRANSCATH/EMBOLIZ  08/25/2020   IR US GUIDE VASC ACCESS RIGHT  08/25/2020    PARTIAL HYSTERECTOMY   10/22/1999    Still has cervix   RADIOLOGY WITH ANESTHESIA N/A 08/25/2020   Procedure: IR WITH ANESTHESIA EMBOLIZATION;  Surgeon: Pedro Earls, MD;  Location: [facility];  Service: Radiology;  Laterality: N/A;   TRIGGER FINGER RELEASE Left 12/25/2013   Procedure: LEFT THUMB TRIGGER RELEASE ;  Surgeon: Leanora Cover, MD;  Location: [facility];  Service: Orthopedics;  Laterality: Left;    ROS: Review of Systems Negative except as stated above  PHYSICAL EXAM: BP 126/83 (BP Location: Right Arm, Patient Position: Sitting, Cuff Size: Normal)   Pulse 83   Temp 98.5 F (36.9 C)   Resp 16   Ht 5\' 6"  (1.676 m)   Wt 187 lb (84.8 kg)   SpO2 97%   BMI 30.18 kg/m   Physical Exam Exam conducted with a chaperone present.  HENT:     Head: Normocephalic and atraumatic.     Right Ear: Tympanic membrane, ear canal and external ear normal.     Left Ear: Tympanic membrane, ear canal and external ear normal.     Nose: Nose normal.     Mouth/Throat:     Mouth: Mucous membranes are moist.     Pharynx: Oropharynx is clear.  Eyes:     Extraocular Movements: Extraocular movements intact.     Conjunctiva/sclera: Conjunctivae normal.     Pupils: Pupils are equal, round, and reactive to light.  Cardiovascular:     Rate and Rhythm: Normal rate and regular rhythm.     Pulses: Normal pulses.     Heart sounds: Normal heart sounds.  Pulmonary:     Effort: Pulmonary effort is normal.     Breath sounds: Normal breath sounds.  Chest:     Comments: Patient declined.  Abdominal:     General: Bowel sounds are normal.     Palpations: Abdomen is soft.  Genitourinary:    General: Normal vulva.     Vagina: Normal.     Cervix: Normal.     Uterus: Normal.      Comments: Carilyn Goodpasture, RN present during today's exam. Musculoskeletal:        General: Normal range of motion.     Cervical back: Normal range of motion and neck supple.  Skin:    General: Skin is  warm and dry.     Capillary Refill: Capillary refill takes less than 2 seconds.  Neurological:     General: No focal deficit present.     Mental Status: She is alert and oriented to person,  place, and time.  Psychiatric:        Mood and Affect: Mood normal.        Behavior: Behavior normal.    ASSESSMENT AND PLAN: 1. Annual physical exam: - Counseled on 150 minutes of exercise per week as tolerated, healthy eating (including decreased daily intake of saturated fats, cholesterol, added sugars, sodium), STI prevention, and routine healthcare maintenance.  2. Screening for metabolic disorder: - BMP last obtained 09/21/2020 and normal at that time.  - Hepatic function panel to check liver function.  - Hepatic Function Panel  3. Screening for deficiency anemia: - CBC to screen for anemia.  - CBC  4. Thyroid disorder screen: - TSH last obtained 09/03/2020 and normal at that time.   5. Pre-diabetes: - Hemoglobin A1c last obtained 09/01/2020 and 5.3% at that time.  - Patient encouraged to recheck in 3 months or sooner if needed.   6. Hyperlipidemia, unspecified hyperlipidemia type: -Practice low-fat heart healthy diet and at least 150 minutes of moderate intensity exercise weekly as tolerated.  - Continue Atorvastatin as prescribed.  - Lipid panel last obtained 11/02/2020.  - Follow-up with primary provider as scheduled.  - atorvastatin (LIPITOR) 80 MG tablet; Take 1 tablet (80 mg total) by mouth at bedtime.  Dispense: 120 tablet; Refill: 0  7. Encounter for screening mammogram for malignant neoplasm of breast: - Referral for breast cancer screening by mammogram.  - MM Digital Screening; Future  8. Pap smear for cervical cancer screening: - Cytology - PAP for cervical cancer screening.  - Cytology - PAP([place])  9. Routine screening for STI (sexually transmitted infection): - Cervicovaginal self-swab to screen for chlamydia, gonorrhea, trichomonas, bacterial vaginitis, and  candida vaginitis. - Cervicovaginal ancillary only  10. Colon cancer screening: - Referral to Gastroenterology for colon cancer screening by colonoscopy. - Ambulatory referral to Gastroenterology  Patient was given the opportunity to ask questions.  Patient verbalized understanding of the plan and was able to repeat key elements of the plan. Patient was given clear instructions to go to Emergency Department or return to medical center if symptoms don't improve, worsen, or new problems develop.The patient verbalized understanding.   Orders Placed This Encounter  Procedures   MM Digital Screening   Hepatic Function Panel   CBC   Ambulatory referral to Gastroenterology     Requested Prescriptions   Signed Prescriptions Disp Refills   atorvastatin (LIPITOR) 80 MG tablet 120 tablet 0    Sig: Take 1 tablet (80 mg total) by mouth at bedtime.    Return in about 1 year (around 12/01/2021) for Physical per patient preference.  Camillia Herter, NP

## 2020-11-29 ENCOUNTER — Telehealth: Payer: Self-pay

## 2020-11-29 ENCOUNTER — Other Ambulatory Visit: Payer: Self-pay

## 2020-11-29 MED ORDER — ATORVASTATIN CALCIUM 80 MG PO TABS
80.0000 mg | ORAL_TABLET | Freq: Every day | ORAL | 0 refills | Status: DC
Start: 2020-11-29 — End: 2020-12-01
  Filled 2020-11-29: qty 30, 30d supply, fill #0

## 2020-11-29 NOTE — Telephone Encounter (Signed)
Rx Atorvastatin  refill sent.

## 2020-11-29 NOTE — Telephone Encounter (Signed)
ERROR

## 2020-11-29 NOTE — Telephone Encounter (Signed)
error 

## 2020-11-30 ENCOUNTER — Ambulatory Visit: Payer: Medicaid Other | Admitting: Speech Pathology

## 2020-11-30 ENCOUNTER — Other Ambulatory Visit: Payer: Self-pay

## 2020-11-30 ENCOUNTER — Encounter: Payer: Self-pay | Admitting: Speech Pathology

## 2020-11-30 ENCOUNTER — Ambulatory Visit: Payer: Medicaid Other | Admitting: Physical Therapy

## 2020-11-30 DIAGNOSIS — M25672 Stiffness of left ankle, not elsewhere classified: Secondary | ICD-10-CM

## 2020-11-30 DIAGNOSIS — R41841 Cognitive communication deficit: Secondary | ICD-10-CM

## 2020-11-30 DIAGNOSIS — R2681 Unsteadiness on feet: Secondary | ICD-10-CM

## 2020-11-30 DIAGNOSIS — I69354 Hemiplegia and hemiparesis following cerebral infarction affecting left non-dominant side: Secondary | ICD-10-CM

## 2020-11-30 DIAGNOSIS — R2689 Other abnormalities of gait and mobility: Secondary | ICD-10-CM

## 2020-11-30 DIAGNOSIS — M6281 Muscle weakness (generalized): Secondary | ICD-10-CM

## 2020-11-30 NOTE — Therapy (Signed)
[place]. [place], [place], [address] Phone: [phone]   Fax:  [phone]  Speech Language Pathology Treatment  Patient Details  Name: Betty Archer MRN: [id number] Date of Birth: [birth year] Referring Provider (SLP): Reesa Chew [place]   Encounter Date: 11/30/2020   End of Session - 11/30/20 1357     Visit Number 15    Number of Visits 17    Date for SLP Re-Evaluation 12/07/20    SLP Start Time 3953    SLP Stop Time  1435    SLP Time Calculation (min) 40 min    Activity Tolerance Patient tolerated treatment well             Past Medical History:  Diagnosis Date   Hyperlipidemia    Stroke [facility])     Past Surgical History:  Procedure Laterality Date   BREAST BIOPSY      Core biopsy done on April 2003   COLONOSCOPY     IR 3D INDEPENDENT WKST  08/25/2020   IR 3D INDEPENDENT WKST  08/25/2020   IR ANGIO INTRA EXTRACRAN SEL INTERNAL CAROTID BILAT MOD SED  08/25/2020   IR ANGIO VERTEBRAL SEL VERTEBRAL BILAT MOD SED  08/25/2020   IR CT HEAD LTD  08/25/2020   IR INTRA CRAN STENT  08/25/2020   IR TRANSCATH/EMBOLIZ  08/25/2020   IR US GUIDE VASC ACCESS RIGHT  08/25/2020   PARTIAL HYSTERECTOMY   10/22/1999    Still has cervix   RADIOLOGY WITH ANESTHESIA N/A 08/25/2020   Procedure: IR WITH ANESTHESIA EMBOLIZATION;  Surgeon: Pedro Earls, MD;  Location: [facility];  Service: Radiology;  Laterality: N/A;   TRIGGER FINGER RELEASE Left 12/25/2013   Procedure: LEFT THUMB TRIGGER RELEASE ;  Surgeon: Leanora Cover, MD;  Location: [facility];  Service: Orthopedics;  Laterality: Left;    There were no vitals filed for this visit.   Subjective Assessment - 11/30/20 1356     Subjective Pt reports things are "good" with no news to report.    Currently in Pain? No/denies                   ADULT SLP TREATMENT - 11/30/20 1434       General Information   Behavior/Cognition Cooperative;Pleasant  mood      Treatment Provided   Treatment provided Cognitive-Linquistic      Cognitive-Linquistic Treatment   Treatment focused on Cognition    Skilled Treatment Pt trained pt to utilize active listening skills during auditory comprehension exercise. Pt was read an "illogical" sentence and asked to correct the sentence to make it more logical. Pt required minA to complete task, but benefited from visual cues (I don't understand. I cant remember. Could you repeat that?). Pt was able to recall 2/4 WRAP strategies independently and 4/4 WRAP strategies with minA. She was able to recall these strategies were used for memory. Cont with conversational exchange and active listening next session.      Assessment / Recommendations / Plan   Plan Continue with current plan of care      Progression Toward Goals   Progression toward goals Progressing toward goals                SLP Short Term Goals - 11/18/20 1320       SLP SHORT TERM GOAL #1   Title Pt will recall 3 attention strategies to assist in increasing focus.  Time 2    Period Weeks    Status Not Met   11/02/20   Target Date 11/18/20      SLP SHORT TERM GOAL #2   Title Pt will recall 3 memory strategies (external and/or internal) to assist in recall of important information.    Time 2    Period Weeks    Status Not Met   11/02/20   Target Date 11/18/20      SLP SHORT TERM GOAL #3   Title Pt will read 3 prescription labels and organize corresponding pills into pill box with supervision from SLP.    Time 4    Period Weeks    Status Achieved   To cont goal; goal not met   Target Date 11/18/20              SLP Long Term Goals - 11/04/20 1453       SLP LONG TERM GOAL #1   Title Pt will demonstrate use of 3 attention strategies in structured environment to increase likliehood of using in daily living.    Time 4    Period Weeks    Status New   11/02/20   Target Date 12/07/20      SLP LONG TERM GOAL #2   Title Pt will  demonstrate use of memory aids to assist with recall of important information.    Time 4    Period Weeks    Status New   11/02/20   Target Date 12/07/20      SLP LONG TERM GOAL #3   Title Pt will organize medication into pill box with minA from pt family.    Time 8    Period Weeks    Status On-going      SLP LONG TERM GOAL #4   Title --              Plan - 11/30/20 1358     Clinical Impression Statement See tx note. SLP encouraged pt to ask for clarification when difficulty in conversation occurs. Cont. with current POC.    Speech Therapy Frequency 2x / week    Duration 8 weeks    Treatment/Interventions Functional tasks;Patient/family education;Cueing hierarchy;Environmental controls;Cognitive reorganization;Compensatory techniques;Internal/external aids;SLP instruction and feedback;Multimodal communcation approach    Potential to Achieve Goals Good    Consulted and Agree with Plan of Care Patient;Family member/caregiver    Family Member [facility] - son             Patient will benefit from skilled therapeutic intervention in order to improve the following deficits and impairments:   Cognitive communication deficit    Problem List Patient Active Problem List   Diagnosis Date Noted   Acute ischemic cerebrovascular accident (CVA) involving anterior cerebral artery territory ([place]) 09/03/2020   Acute CVA (cerebrovascular accident) ([place]) 09/01/2020   Acute ischemic stroke ([place]) 08/31/2020   Aneurysm of anterior communicating artery 08/25/2020   Aneurysm, cerebral, nonruptured 08/25/2020   Microcytic anemia 10/26/2014   Vitamin D deficiency 10/17/2014   Stenosing tenosynovitis of thumb 10/17/2014   Internal hemorrhoids 10/17/2014   History of colon polyps 10/17/2014   Osteoarthritis of carpometacarpal joints of both thumbs 04/04/2014   Pre-diabetes 08/18/2013   History of TIA (transient ischemic attack) 08/12/2013   Routine health maintenance 08/15/2010    Hyperlipidemia 08/05/2008   Obesity 07/30/2007   TOBACCO DEPENDENCE 04/19/2006    Rosann Auerbach [place] MS, [place], CBIS  11/30/2020, 2:37 PM  [facility]  [address] [place] [place], [place], [address] Phone: [phone]   Fax:  [phone]   Name: Betty Archer MRN: [id number] Date of Birth: Jun 27, 1960

## 2020-11-30 NOTE — Addendum Note (Signed)
Addended by: Colbert Ewing MARIE L on: 11/30/2020 02:08 PM   Modules accepted: Orders

## 2020-11-30 NOTE — Therapy (Addendum)
[facility]. [place], [place], [address] Phone: [phone]   Fax:  [phone]  Physical Therapy Treatment and Re-Cert  Patient Details  Name: Betty Archer MRN: [id number] Date of Birth: 23-Jun-1960 Referring Provider (PT): Bary Leriche, [place]   Encounter Date: 11/30/2020   PT End of Session - 11/30/20 1316     Visit Number 13    Number of Visits 17    Date for PT Re-Evaluation 12/21/20    Authorization Type Self Pay    PT Start Time 1884    PT Stop Time 1400    PT Time Calculation (min) 47 min    Activity Tolerance Patient tolerated treatment well    Behavior During Therapy [facility] for tasks assessed/performed             Past Medical History:  Diagnosis Date   Hyperlipidemia    Stroke [facility])     Past Surgical History:  Procedure Laterality Date   BREAST BIOPSY      Core biopsy done on April 2003   COLONOSCOPY     IR 3D INDEPENDENT WKST  08/25/2020   IR 3D INDEPENDENT WKST  08/25/2020   IR ANGIO INTRA EXTRACRAN SEL INTERNAL CAROTID BILAT MOD SED  08/25/2020   IR ANGIO VERTEBRAL SEL VERTEBRAL BILAT MOD SED  08/25/2020   IR CT HEAD LTD  08/25/2020   IR INTRA CRAN STENT  08/25/2020   IR TRANSCATH/EMBOLIZ  08/25/2020   IR US GUIDE VASC ACCESS RIGHT  08/25/2020   PARTIAL HYSTERECTOMY   10/22/1999    Still has cervix   RADIOLOGY WITH ANESTHESIA N/A 08/25/2020   Procedure: IR WITH ANESTHESIA EMBOLIZATION;  Surgeon: Pedro Earls, MD;  Location: [facility];  Service: Radiology;  Laterality: N/A;   TRIGGER FINGER RELEASE Left 12/25/2013   Procedure: LEFT THUMB TRIGGER RELEASE ;  Surgeon: Leanora Cover, MD;  Location: [facility];  Service: Orthopedics;  Laterality: Left;    There were no vitals filed for this visit.   Subjective Assessment - 11/30/20 1315     Subjective Pt reports hip pain has improved. Nothing new to report.    Pertinent History HLD, repair of right ACA aneurysm, left MCA  aneurysm (unrepaired)    Diagnostic tests 09/01/20 brain MRI: Acute Right ACA territory infarct, Ischemia also in the Left ACA territory    Patient Stated Goals patient unable to verbalize goals for therapy but agreeable to work on strengthening    Currently in Pain? No/denies                [facility] PT Assessment - 11/30/20 0001       Strength   Overall Strength Comments R hip: Flex 5, ABD 4+, ADD 4. L hip: Flex 4, ABD 4+, ADD 4. R knee: ex 5, flex 4+. L knee: ex 4+, flex 4. R ankle: DF4+, PF 4+. L ankle: DF4, PF 4+      Ambulation/Gait   Ambulation Distance (Feet) 400 Feet    Assistive device None    Gait Pattern --   slightly decreased DF on L   Ambulation Surface Unlevel;Level;Indoor;Outdoor;Grass   ascend/descend hill   Ramp 5: Supervision    Curb 5: Supervision      High Level Balance   High Level Balance Comments SLS on L: 13 sec; R: 45 sec  [place] Adult PT Treatment/Exercise - 11/30/20 0001       Knee/Hip Exercises: Aerobic   Recumbent Bike level 4 x 5 min      Knee/Hip Exercises: Machines for Strengthening   Cybex Knee Flexion L LE 30# 2x10      Knee/Hip Exercises: Standing   Hip Flexion Stengthening;Both;2 sets;20 reps    Hip Flexion Limitations resisted march with green TB, holding onto TM rail      Knee/Hip Exercises: Seated   Other Seated Knee/Hip Exercises L ankle DF with green TB 2x15      Knee/Hip Exercises: Supine   Single Leg Bridge Right;Left;2 sets;10 reps;Strengthening    Other Supine Knee/Hip Exercises Hip flexion with green tband 3x10      Knee/Hip Exercises: Sidelying   Hip ADduction Strengthening;Right;Left;10 reps;3 sets    Other Sidelying Knee/Hip Exercises Reverse clam shell red tband 3x10                     PT Education - 11/30/20 1359     Education Details Updated HEP. Discussed her POC and what she wants to work on with her remaining visits since she is doing well.    Person(s)  Educated Patient    Methods Explanation;Demonstration;Tactile cues;Verbal cues;Handout    Comprehension Verbalized understanding;Returned demonstration              PT Short Term Goals - 11/04/20 1551       PT SHORT TERM GOAL #1   Title Patient to be mod I/independent with initial HEP.    Status Achieved               PT Long Term Goals - 11/30/20 1356       PT LONG TERM GOAL #1   Title Patient to be mod I/independent with advanced HEP.    Status On-going    Target Date 12/21/20      PT LONG TERM GOAL #2   Title Patient to demonstrate B LE strength >/=4+/5.    Status Revised   weak in L hip flexion, adduction, ankle DF strength   Target Date 12/21/20      PT LONG TERM GOAL #3   Title Pt will be able to perform L SLS = R SLS    Baseline Currently able to hold L SLS for 13 sec; R for 45 sec (limited due to L hip flexion fatigue)    Status New    Target Date 12/21/20      PT LONG TERM GOAL #4   Title Pt will be able to amb ind for at least 1000' for community amb    Baseline [facility] for safety    Status New    Target Date 12/21/20      PT LONG TERM GOAL #5   Title --    Status Achieved                   Plan - 11/30/20 1400     Clinical Impression Statement Pt reports her L hip pain has resolved. Pt continues to have mild L LE weakness compared to R with some decreased L LE stability (see SLS). Pt able to tolerate ambulating >400' outdoors on unlevel surfaces and hills. At this time, pt would like her remaining PT visits to continue to work on her L LE strength and weaning her completely off her cane for ind amb at the community level. Pt is doing well and has nearly met all  of her previous goals; goals have been updated accordingly.    Personal Factors and Comorbidities Age;Comorbidity 3+;Time since onset of injury/illness/exacerbation;Fitness;Transportation    Comorbidities HLD, repair of right ACA aneurysm, left MCA aneurysm (unrepaired)     Examination-Activity Limitations Bathing;Locomotion Level;Transfers;Bend;Carry;Squat;Dressing;Stairs;Stand;Hygiene/Grooming;Lift    Examination-Participation Restrictions Laundry;Shop;Driving;Community Activity;Cleaning;Occupation;Church;Meal Prep    Stability/Clinical Decision Making Evolving/Moderate complexity    Clinical Decision Making Low    Rehab Potential Good    PT Frequency 2x / week    PT Duration 3 weeks    PT Treatment/Interventions ADLs/Self Care Home Management;Canalith Repostioning;Cryotherapy;Electrical Stimulation;DME Instruction;Ultrasound;Moist Heat;Gait training;Stair training;Functional mobility training;Therapeutic activities;Therapeutic exercise;Balance training;Neuromuscular re-education;Manual techniques;Patient/family education;Passive range of motion;Dry needling;Energy conservation;Vestibular;Taping    PT Next Visit Plan continue to work on L hip flexion, adduction, ankle DF strength; ankle DF ROM. Wean off SPC, improve single leg stability    PT Home Exercise Plan Access Code [email or web address]    Consulted and Agree with Plan of Care Patient             Patient will benefit from skilled therapeutic intervention in order to improve the following deficits and impairments:  Abnormal gait, Decreased coordination, Decreased range of motion, Difficulty walking, Increased fascial restricitons, Dizziness, Decreased activity tolerance, Decreased safety awareness, Pain, Decreased balance, Impaired flexibility, Improper body mechanics, Postural dysfunction, Increased edema, Decreased strength, Decreased mobility  Visit Diagnosis: Hemiplegia and hemiparesis following cerebral infarction affecting left non-dominant side (HCC)  Muscle weakness (generalized)  Stiffness of left ankle, not elsewhere classified  Other abnormalities of gait and mobility  Unsteadiness on feet     Problem List Patient Active Problem List   Diagnosis Date Noted   Acute ischemic cerebrovascular  accident (CVA) involving anterior cerebral artery territory ([place]) 09/03/2020   Acute CVA (cerebrovascular accident) ([place]) 09/01/2020   Acute ischemic stroke ([place]) 08/31/2020   Aneurysm of anterior communicating artery 08/25/2020   Aneurysm, cerebral, nonruptured 08/25/2020   Microcytic anemia 10/26/2014   Vitamin D deficiency 10/17/2014   Stenosing tenosynovitis of thumb 10/17/2014   Internal hemorrhoids 10/17/2014   History of colon polyps 10/17/2014   Osteoarthritis of carpometacarpal joints of both thumbs 04/04/2014   Pre-diabetes 08/18/2013   History of TIA (transient ischemic attack) 08/12/2013   Routine health maintenance 08/15/2010   Hyperlipidemia 08/05/2008   Obesity 07/30/2007   TOBACCO DEPENDENCE 04/19/2006    Noble Cicalese April Gordy Levan, PT, DPT 11/30/2020, 2:06 PM  [place]. [place], [place], [address] Phone: [phone]   Fax:  [phone]  Name: Betty Archer MRN: [id number] Date of Birth: 01-06-1961

## 2020-12-01 ENCOUNTER — Other Ambulatory Visit (HOSPITAL_COMMUNITY)
Admission: RE | Admit: 2020-12-01 | Discharge: 2020-12-01 | Disposition: A | Discharge status: Home / Self Care | Payer: Medicaid Other | Source: Ambulatory Visit | Attending: Family | Admitting: Family

## 2020-12-01 ENCOUNTER — Encounter: Payer: Self-pay | Admitting: Family

## 2020-12-01 ENCOUNTER — Ambulatory Visit (INDEPENDENT_AMBULATORY_CARE_PROVIDER_SITE_OTHER): Payer: Self-pay | Admitting: Family

## 2020-12-01 ENCOUNTER — Other Ambulatory Visit: Payer: Self-pay

## 2020-12-01 VITALS — BP 126/83 | HR 83 | Temp 98.5°F | Resp 16 | Ht 66.0 in | Wt 187.0 lb

## 2020-12-01 DIAGNOSIS — Z113 Encounter for screening for infections with a predominantly sexual mode of transmission: Secondary | ICD-10-CM

## 2020-12-01 DIAGNOSIS — Z0001 Encounter for general adult medical examination with abnormal findings: Secondary | ICD-10-CM

## 2020-12-01 DIAGNOSIS — Z1211 Encounter for screening for malignant neoplasm of colon: Secondary | ICD-10-CM

## 2020-12-01 DIAGNOSIS — Z13228 Encounter for screening for other metabolic disorders: Secondary | ICD-10-CM

## 2020-12-01 DIAGNOSIS — Z124 Encounter for screening for malignant neoplasm of cervix: Secondary | ICD-10-CM | POA: Diagnosis present

## 2020-12-01 DIAGNOSIS — R7303 Prediabetes: Secondary | ICD-10-CM

## 2020-12-01 DIAGNOSIS — Z0000 Encounter for general adult medical examination without abnormal findings: Secondary | ICD-10-CM

## 2020-12-01 DIAGNOSIS — Z1329 Encounter for screening for other suspected endocrine disorder: Secondary | ICD-10-CM

## 2020-12-01 DIAGNOSIS — E785 Hyperlipidemia, unspecified: Secondary | ICD-10-CM

## 2020-12-01 DIAGNOSIS — Z130 Encounter for screening for diseases of the blood and blood-forming organs and certain disorders involving the immune mechanism: Secondary | ICD-10-CM

## 2020-12-01 DIAGNOSIS — Z1231 Encounter for screening mammogram for malignant neoplasm of breast: Secondary | ICD-10-CM

## 2020-12-01 MED ORDER — ATORVASTATIN CALCIUM 80 MG PO TABS
80.0000 mg | ORAL_TABLET | Freq: Every day | ORAL | 0 refills | Status: DC
Start: 2020-12-01 — End: 2020-12-31
  Filled 2020-12-01: qty 120, 120d supply, fill #0

## 2020-12-01 NOTE — Patient Instructions (Signed)
Preventive Care 40-61 Years Old, Female Preventive care refers to lifestyle choices and visits with your health care provider that can promote health and wellness. This includes: A yearly physical exam. This is also called an annual wellness visit. Regular dental and eye exams. Immunizations. Screening for certain conditions. Healthy lifestyle choices, such as: Eating a healthy diet. Getting regular exercise. Not using drugs or products that contain nicotine and tobacco. Limiting alcohol use. What can I expect for my preventive care visit? Physical exam Your health care provider will check your: Height and weight. These may be used to calculate your BMI (body mass index). BMI is a measurement that tells if you are at a healthy weight. Heart rate and blood pressure. Body temperature. Skin for abnormal spots. Counseling Your health care provider may ask you questions about your: Past medical problems. Family's medical history. Alcohol, tobacco, and drug use. Emotional well-being. Home life and relationship well-being. Sexual activity. Diet, exercise, and sleep habits. Work and work environment. Access to firearms. Method of birth control. Menstrual cycle. Pregnancy history. What immunizations do I need? Vaccines are usually given at various ages, according to a schedule. Your health care provider will recommend vaccines for you based on your age, medical history, and lifestyle or other factors, such as travel or where you work. What tests do I need? Blood tests Lipid and cholesterol levels. These may be checked every 5 years, or more often if you are over 50 years old. Hepatitis C test. Hepatitis B test. Screening Lung cancer screening. You may have this screening every year starting at age 55 if you have a 30-pack-year history of smoking and currently smoke or have quit within the past 15 years. Colorectal cancer screening. All adults should have this screening starting at  age 50 and continuing until age 75. Your health care provider may recommend screening at age 45 if you are at increased risk. You will have tests every 1-10 years, depending on your results and the type of screening test. Diabetes screening. This is done by checking your blood sugar (glucose) after you have not eaten for a while (fasting). You may have this done every 1-3 years. Mammogram. This may be done every 1-2 years. Talk with your health care provider about when you should start having regular mammograms. This may depend on whether you have a family history of breast cancer. BRCA-related cancer screening. This may be done if you have a family history of breast, ovarian, tubal, or peritoneal cancers. Pelvic exam and Pap test. This may be done every 3 years starting at age 21. Starting at age 30, this may be done every 5 years if you have a Pap test in combination with an HPV test. Other tests STD (sexually transmitted disease) testing, if you are at risk. Bone density scan. This is done to screen for osteoporosis. You may have this scan if you are at high risk for osteoporosis. Talk with your health care provider about your test results, treatment options, and if necessary, the need for more tests. Follow these instructions at home: Eating and drinking  Eat a diet that includes fresh fruits and vegetables, whole grains, lean protein, and low-fat dairy products. Take vitamin and mineral supplements as recommended by your health care provider. Do not drink alcohol if: Your health care provider tells you not to drink. You are pregnant, may be pregnant, or are planning to become pregnant. If you drink alcohol: Limit how much you have to 0-1 drink a day. Be   aware of how much alcohol is in your drink. In the [place] one drink equals one 12 oz bottle of beer (355 mL), one 5 oz glass of wine (148 mL), or one 1 oz glass of hard liquor (44 mL). Lifestyle Take daily care of your teeth and  gums. Brush your teeth every morning and night with fluoride toothpaste. Floss one time each day. Stay active. Exercise for at least 30 minutes 5 or more days each week. Do not use any products that contain nicotine or tobacco, such as cigarettes, e-cigarettes, and chewing tobacco. If you need help quitting, ask your health care provider. Do not use drugs. If you are sexually active, practice safe sex. Use a condom or other form of protection to prevent STIs (sexually transmitted infections). If you do not wish to become pregnant, use a form of birth control. If you plan to become pregnant, see your health care provider for a prepregnancy visit. If told by your health care provider, take low-dose aspirin daily starting at age 63. Find healthy ways to cope with stress, such as: Meditation, yoga, or listening to music. Journaling. Talking to a trusted person. Spending time with friends and family. Safety Always wear your seat belt while driving or riding in a vehicle. Do not drive: If you have been drinking alcohol. Do not ride with someone who has been drinking. When you are tired or distracted. While texting. Wear a helmet and other protective equipment during sports activities. If you have firearms in your house, make sure you follow all gun safety procedures. What's next? Visit your health care provider once a year for an annual wellness visit. Ask your health care provider how often you should have your eyes and teeth checked. Stay up to date on all vaccines. This information is not intended to replace advice given to you by your health care provider. Make sure you discuss any questions you have with your health care provider. Document Revised: 04/16/2020 Document Reviewed: 10/18/2017 Elsevier Patient Education  2022 [employer].

## 2020-12-01 NOTE — Progress Notes (Signed)
Physical with PAP

## 2020-12-02 ENCOUNTER — Encounter: Payer: Self-pay | Admitting: Speech Pathology

## 2020-12-02 ENCOUNTER — Encounter: Payer: Self-pay | Admitting: Physical Therapy

## 2020-12-02 ENCOUNTER — Other Ambulatory Visit: Payer: Self-pay

## 2020-12-02 ENCOUNTER — Ambulatory Visit: Payer: Medicaid Other | Admitting: Physical Therapy

## 2020-12-02 ENCOUNTER — Ambulatory Visit: Payer: Medicaid Other | Admitting: Speech Pathology

## 2020-12-02 ENCOUNTER — Other Ambulatory Visit: Payer: Self-pay | Admitting: Family

## 2020-12-02 DIAGNOSIS — R2689 Other abnormalities of gait and mobility: Secondary | ICD-10-CM

## 2020-12-02 DIAGNOSIS — Z130 Encounter for screening for diseases of the blood and blood-forming organs and certain disorders involving the immune mechanism: Secondary | ICD-10-CM

## 2020-12-02 DIAGNOSIS — I69354 Hemiplegia and hemiparesis following cerebral infarction affecting left non-dominant side: Secondary | ICD-10-CM | POA: Diagnosis not present

## 2020-12-02 DIAGNOSIS — R2681 Unsteadiness on feet: Secondary | ICD-10-CM

## 2020-12-02 DIAGNOSIS — R41841 Cognitive communication deficit: Secondary | ICD-10-CM

## 2020-12-02 DIAGNOSIS — M25672 Stiffness of left ankle, not elsewhere classified: Secondary | ICD-10-CM

## 2020-12-02 DIAGNOSIS — M6281 Muscle weakness (generalized): Secondary | ICD-10-CM

## 2020-12-02 LAB — CBC
Hematocrit: 35.7 % (ref 34.0–46.6)
Hemoglobin: 10.7 g/dL — ABNORMAL LOW (ref 11.1–15.9)
MCH: 23.7 pg — ABNORMAL LOW (ref 26.6–33.0)
MCHC: 30 g/dL — ABNORMAL LOW (ref 31.5–35.7)
MCV: 79 fL (ref 79–97)
Platelets: 220 10*3/uL (ref 150–450)
RBC: 4.52 x10E6/uL (ref 3.77–5.28)
RDW: 15.3 % (ref 11.7–15.4)
WBC: 4.6 10*3/uL (ref 3.4–10.8)

## 2020-12-02 LAB — CERVICOVAGINAL ANCILLARY ONLY
Bacterial Vaginitis (gardnerella): NEGATIVE
Candida Glabrata: NEGATIVE
Candida Vaginitis: NEGATIVE
Chlamydia: NEGATIVE
Comment: NEGATIVE
Comment: NEGATIVE
Comment: NEGATIVE
Comment: NEGATIVE
Comment: NEGATIVE
Comment: NORMAL
Neisseria Gonorrhea: NEGATIVE
Trichomonas: NEGATIVE

## 2020-12-02 LAB — HEPATIC FUNCTION PANEL
ALT: 18 IU/L (ref 0–32)
AST: 17 IU/L (ref 0–40)
Albumin: 4.3 g/dL (ref 3.8–4.8)
Alkaline Phosphatase: 94 IU/L (ref 44–121)
Bilirubin Total: <0.2 mg/dL (ref 0.0–1.2)
Bilirubin, Direct: <0.1 mg/dL (ref 0.00–0.40)
Total Protein: 7 g/dL (ref 6.0–8.5)

## 2020-12-02 NOTE — Therapy (Signed)
[place]. [place], [place], [address] Phone: [phone]   Fax:  [phone]  Physical Therapy Treatment  Patient Details  Name: Betty Archer MRN: [id number] Date of Birth: 09/12/1960 Referring Provider (PT): Bary Leriche, [place]   Encounter Date: 12/02/2020   PT End of Session - 12/02/20 1432     Visit Number 14    Number of Visits 17    Date for PT Re-Evaluation 12/21/20    PT Start Time 3825    PT Stop Time 0539    PT Time Calculation (min) 39 min    Activity Tolerance Patient tolerated treatment well    Behavior During Therapy [facility] for tasks assessed/performed             Past Medical History:  Diagnosis Date   Hyperlipidemia    Stroke [facility])     Past Surgical History:  Procedure Laterality Date   BREAST BIOPSY      Core biopsy done on April 2003   COLONOSCOPY     IR 3D INDEPENDENT WKST  08/25/2020   IR 3D INDEPENDENT WKST  08/25/2020   IR ANGIO INTRA EXTRACRAN SEL INTERNAL CAROTID BILAT MOD SED  08/25/2020   IR ANGIO VERTEBRAL SEL VERTEBRAL BILAT MOD SED  08/25/2020   IR CT HEAD LTD  08/25/2020   IR INTRA CRAN STENT  08/25/2020   IR TRANSCATH/EMBOLIZ  08/25/2020   IR US GUIDE VASC ACCESS RIGHT  08/25/2020   PARTIAL HYSTERECTOMY   10/22/1999    Still has cervix   RADIOLOGY WITH ANESTHESIA N/A 08/25/2020   Procedure: IR WITH ANESTHESIA EMBOLIZATION;  Surgeon: Pedro Earls, MD;  Location: [place];  Service: Radiology;  Laterality: N/A;   TRIGGER FINGER RELEASE Left 12/25/2013   Procedure: LEFT THUMB TRIGGER RELEASE ;  Surgeon: Leanora Cover, MD;  Location: [place];  Service: Orthopedics;  Laterality: Left;    There were no vitals filed for this visit.   Subjective Assessment - 12/02/20 1401     Subjective No falls, feeling stronger (patient is more verbal today and answers quesitons quickly)    Currently in Pain? No/denies                                [facility] Adult PT Treatment/Exercise - 12/02/20 0001       Ambulation/Gait   Gait Comments around the back building up and down slopes, trying to maintain a good pace half way she slowed down and then going up the hill very slow pace, she held her leg and back and reported tired and weak, we did not use the assistive device      High Level Balance   High Level Balance Comments airex balance beam side stepping, tandem walk, on airex ball toss      Knee/Hip Exercises: Aerobic   Nustep level 5 x 6 minutes      Knee/Hip Exercises: Machines for Strengthening   Cybex Knee Extension L LE 10# 2x10    Cybex Knee Flexion L LE 30# 2x10                       PT Short Term Goals - 11/04/20 1551       PT SHORT TERM GOAL #1   Title Patient to be mod I/independent with initial HEP.    Status Achieved  PT Long Term Goals - 12/02/20 1435       PT LONG TERM GOAL #1   Title Patient to be mod I/independent with advanced HEP.    Status On-going      PT LONG TERM GOAL #2   Title Patient to demonstrate B LE strength >/=4+/5.                   Plan - 12/02/20 1432     Clinical Impression Statement Patient was very fatigued with the walk around the building.  about half way around she slowed down, then up the hill, had to go even slower, did not use a device.  She tended to hold the left leg and the back because of the fatigue.  She needed water and rest after this    PT Next Visit Plan continue to work on L hip flexion, adduction, ankle DF strength; ankle DF ROM. Wean off SPC, improve single leg stability    Consulted and Agree with Plan of Care Patient             Patient will benefit from skilled therapeutic intervention in order to improve the following deficits and impairments:  Abnormal gait, Decreased coordination, Decreased range of motion, Difficulty walking, Increased fascial restricitons, Dizziness,  Decreased activity tolerance, Decreased safety awareness, Pain, Decreased balance, Impaired flexibility, Improper body mechanics, Postural dysfunction, Increased edema, Decreased strength, Decreased mobility  Visit Diagnosis: Hemiplegia and hemiparesis following cerebral infarction affecting left non-dominant side (HCC)  Muscle weakness (generalized)  Stiffness of left ankle, not elsewhere classified  Other abnormalities of gait and mobility  Unsteadiness on feet     Problem List Patient Active Problem List   Diagnosis Date Noted   Acute ischemic cerebrovascular accident (CVA) involving anterior cerebral artery territory ([place]) 09/03/2020   Acute CVA (cerebrovascular accident) ([place]) 09/01/2020   Acute ischemic stroke ([place]) 08/31/2020   Aneurysm of anterior communicating artery 08/25/2020   Aneurysm, cerebral, nonruptured 08/25/2020   Microcytic anemia 10/26/2014   Vitamin D deficiency 10/17/2014   Stenosing tenosynovitis of thumb 10/17/2014   Internal hemorrhoids 10/17/2014   History of colon polyps 10/17/2014   Osteoarthritis of carpometacarpal joints of both thumbs 04/04/2014   Pre-diabetes 08/18/2013   History of TIA (transient ischemic attack) 08/12/2013   Routine health maintenance 08/15/2010   Hyperlipidemia 08/05/2008   Obesity 07/30/2007   TOBACCO DEPENDENCE 04/19/2006    Sumner Boast, PT 12/02/2020, 2:36 PM  [place]. [place], [place], [address] Phone: [phone]   Fax:  [phone]  Name: Betty Archer MRN: [id number] Date of Birth: [birth year]

## 2020-12-02 NOTE — Progress Notes (Signed)
Please call patient with update.   Liver function normal.  Adding iron panel to further screen for anemia.

## 2020-12-02 NOTE — Progress Notes (Signed)
Please call patient with update.   Gonorrhea, Chlamydia, Trichomonas, Bacterial Vaginitis, and Candida Vaginitis (sometimes called yeast infection) negative.

## 2020-12-03 NOTE — Therapy (Signed)
[place]. [place], [place], [address] Phone: [phone]   Fax:  [phone]  Speech Language Pathology Treatment  Patient Details  Name: Betty Archer MRN: [id number] Date of Birth: 09-10-1960 Referring Provider (SLP): Reesa Chew [place]   Encounter Date: 12/02/2020   End of Session - 12/02/20 1319     Visit Number 16    Number of Visits 17    Date for SLP Re-Evaluation 12/07/20    SLP Start Time 7628    SLP Stop Time  3151    SLP Time Calculation (min) 40 min    Activity Tolerance Patient tolerated treatment well             Past Medical History:  Diagnosis Date   Hyperlipidemia    Stroke [facility])     Past Surgical History:  Procedure Laterality Date   BREAST BIOPSY      Core biopsy done on April 2003   COLONOSCOPY     IR 3D INDEPENDENT WKST  08/25/2020   IR 3D INDEPENDENT WKST  08/25/2020   IR ANGIO INTRA EXTRACRAN SEL INTERNAL CAROTID BILAT MOD SED  08/25/2020   IR ANGIO VERTEBRAL SEL VERTEBRAL BILAT MOD SED  08/25/2020   IR CT HEAD LTD  08/25/2020   IR INTRA CRAN STENT  08/25/2020   IR TRANSCATH/EMBOLIZ  08/25/2020   IR US GUIDE VASC ACCESS RIGHT  08/25/2020   PARTIAL HYSTERECTOMY   10/22/1999    Still has cervix   RADIOLOGY WITH ANESTHESIA N/A 08/25/2020   Procedure: IR WITH ANESTHESIA EMBOLIZATION;  Surgeon: Pedro Earls, MD;  Location: [place];  Service: Radiology;  Laterality: N/A;   TRIGGER FINGER RELEASE Left 12/25/2013   Procedure: LEFT THUMB TRIGGER RELEASE ;  Surgeon: Leanora Cover, MD;  Location: [place];  Service: Orthopedics;  Laterality: Left;    There were no vitals filed for this visit.   Subjective Assessment - 12/02/20 1318     Subjective Pt reports she feels positive about how she is doing.    Currently in Pain? No/denies                   ADULT SLP TREATMENT - 12/03/20 0821       General Information   Behavior/Cognition Cooperative;Pleasant  mood      Treatment Provided   Treatment provided Cognitive-Linquistic      Cognitive-Linquistic Treatment   Treatment focused on Cognition    Skilled Treatment Pt and SLP generated list of problems of why she feels like she would have trouble going back to work. Pt reported she "feels slow" both physically and mentally. It terms of "mentally", pt reported she would have difficulty understanding. SLP edu using [facility] Speech therapy "Mental Slowness" and spoke about plans to cont to address responding with scripted phrases "I don't understand." "I can't remember" and "Can you repeat that?" and provide strategies to assist.      Assessment / Recommendations / Plan   Plan Continue with current plan of care      Progression Toward Goals   Progression toward goals Progressing toward goals                SLP Short Term Goals - 11/18/20 1320       SLP SHORT TERM GOAL #1   Title Pt will recall 3 attention strategies to assist in increasing focus.    Time 2    Period Weeks  Status Not Met   11/02/20   Target Date 11/18/20      SLP SHORT TERM GOAL #2   Title Pt will recall 3 memory strategies (external and/or internal) to assist in recall of important information.    Time 2    Period Weeks    Status Not Met   11/02/20   Target Date 11/18/20      SLP SHORT TERM GOAL #3   Title Pt will read 3 prescription labels and organize corresponding pills into pill box with supervision from SLP.    Time 4    Period Weeks    Status Achieved   To cont goal; goal not met   Target Date 11/18/20              SLP Long Term Goals - 11/04/20 1453       SLP LONG TERM GOAL #1   Title Pt will demonstrate use of 3 attention strategies in structured environment to increase likliehood of using in daily living.    Time 4    Period Weeks    Status New   11/02/20   Target Date 12/07/20      SLP LONG TERM GOAL #2   Title Pt will demonstrate use of memory aids to assist with recall of  important information.    Time 4    Period Weeks    Status New   11/02/20   Target Date 12/07/20      SLP LONG TERM GOAL #3   Title Pt will organize medication into pill box with minA from pt family.    Time 8    Period Weeks    Status On-going      SLP LONG TERM GOAL #4   Title --              Plan - 12/03/20 0825     Clinical Impression Statement See tx note. To address mental slowness. Cont. with current POC.    Speech Therapy Frequency 2x / week    Duration 8 weeks    Treatment/Interventions Functional tasks;Patient/family education;Cueing hierarchy;Environmental controls;Cognitive reorganization;Compensatory techniques;Internal/external aids;SLP instruction and feedback;Multimodal communcation approach    Potential to Achieve Goals Good    Consulted and Agree with Plan of Care Patient;Family member/caregiver    Family Member [facility] - son             Patient will benefit from skilled therapeutic intervention in order to improve the following deficits and impairments:   Cognitive communication deficit    Problem List Patient Active Problem List   Diagnosis Date Noted   Acute ischemic cerebrovascular accident (CVA) involving anterior cerebral artery territory ([place]) 09/03/2020   Acute CVA (cerebrovascular accident) ([place]) 09/01/2020   Acute ischemic stroke ([place]) 08/31/2020   Aneurysm of anterior communicating artery 08/25/2020   Aneurysm, cerebral, nonruptured 08/25/2020   Microcytic anemia 10/26/2014   Vitamin D deficiency 10/17/2014   Stenosing tenosynovitis of thumb 10/17/2014   Internal hemorrhoids 10/17/2014   History of colon polyps 10/17/2014   Osteoarthritis of carpometacarpal joints of both thumbs 04/04/2014   Pre-diabetes 08/18/2013   History of TIA (transient ischemic attack) 08/12/2013   Routine health maintenance 08/15/2010   Hyperlipidemia 08/05/2008   Obesity 07/30/2007   TOBACCO DEPENDENCE 04/19/2006    Rosann Auerbach Baird MS,  [place], CBIS  12/03/2020, 8:26 AM  [place]. [place], [place], [address] Phone: [phone]   Fax:  [phone]  Name: Betty Archer MRN: [id number] Date of Birth: 09-21-1960

## 2020-12-06 LAB — CYTOLOGY - PAP
Comment: NEGATIVE
Diagnosis: NEGATIVE
High risk HPV: NEGATIVE

## 2020-12-06 NOTE — Progress Notes (Signed)
Please call patient with update.   PAP with no lesion or malignancy. HPV negative. Repeat PAP in 3 years or sooner if needed.

## 2020-12-07 ENCOUNTER — Ambulatory Visit: Payer: Medicaid Other | Admitting: Physical Therapy

## 2020-12-07 ENCOUNTER — Other Ambulatory Visit: Payer: Self-pay

## 2020-12-07 ENCOUNTER — Ambulatory Visit: Payer: Medicaid Other | Admitting: Speech Pathology

## 2020-12-07 ENCOUNTER — Encounter: Payer: Self-pay | Admitting: Speech Pathology

## 2020-12-07 DIAGNOSIS — R2689 Other abnormalities of gait and mobility: Secondary | ICD-10-CM

## 2020-12-07 DIAGNOSIS — R2681 Unsteadiness on feet: Secondary | ICD-10-CM

## 2020-12-07 DIAGNOSIS — M6281 Muscle weakness (generalized): Secondary | ICD-10-CM

## 2020-12-07 DIAGNOSIS — R41841 Cognitive communication deficit: Secondary | ICD-10-CM

## 2020-12-07 DIAGNOSIS — I69354 Hemiplegia and hemiparesis following cerebral infarction affecting left non-dominant side: Secondary | ICD-10-CM | POA: Diagnosis not present

## 2020-12-07 NOTE — Therapy (Signed)
[place]. [place], [place], [address] Phone: [phone]   Fax:  [phone]  Speech Language Pathology Treatment & Recertification  Patient Details  Name: Betty Archer MRN: [id number] Date of Birth: 07-11-1960 Referring Provider (SLP): Reesa Chew [place]   Encounter Date: 12/07/2020   End of Session - 12/07/20 1410     Visit Number 17    Number of Visits 17    Date for SLP Re-Evaluation 01/04/21    Authorization - Visit Number 0    Authorization - Number of Visits 8    SLP Start Time 1400    SLP Stop Time  1440    SLP Time Calculation (min) 40 min    Activity Tolerance Patient tolerated treatment well             Past Medical History:  Diagnosis Date   Hyperlipidemia    Stroke [facility])     Past Surgical History:  Procedure Laterality Date   BREAST BIOPSY      Core biopsy done on April 2003   COLONOSCOPY     IR 3D INDEPENDENT WKST  08/25/2020   IR 3D INDEPENDENT WKST  08/25/2020   IR ANGIO INTRA EXTRACRAN SEL INTERNAL CAROTID BILAT MOD SED  08/25/2020   IR ANGIO VERTEBRAL SEL VERTEBRAL BILAT MOD SED  08/25/2020   IR CT HEAD LTD  08/25/2020   IR INTRA CRAN STENT  08/25/2020   IR TRANSCATH/EMBOLIZ  08/25/2020   IR US GUIDE VASC ACCESS RIGHT  08/25/2020   PARTIAL HYSTERECTOMY   10/22/1999    Still has cervix   RADIOLOGY WITH ANESTHESIA N/A 08/25/2020   Procedure: IR WITH ANESTHESIA EMBOLIZATION;  Surgeon: Pedro Earls, MD;  Location: [place];  Service: Radiology;  Laterality: N/A;   TRIGGER FINGER RELEASE Left 12/25/2013   Procedure: LEFT THUMB TRIGGER RELEASE ;  Surgeon: Leanora Cover, MD;  Location: [place];  Service: Orthopedics;  Laterality: Left;    There were no vitals filed for this visit.   Subjective Assessment - 12/07/20 1409     Subjective Pt was more interactive when arriving in therapy room.    Currently in Pain? No/denies                   ADULT SLP  TREATMENT - 12/07/20 1451       General Information   Behavior/Cognition Cooperative;Pleasant mood      Treatment Provided   Treatment provided Cognitive-Linquistic      Cognitive-Linquistic Treatment   Treatment focused on Cognition    Skilled Treatment Pt practiced "time pressure situations", where she identified the problem and established a solution to the problem. Pt was able to complete this task with minA. SLP noted pt required verbal cues in order to define the problem in given scenarios. Pt also participated in a divergent naming task where she was instructed to name animals and fruits. SLP noted that pt benefited from breaking down categories and naming objects by color in order to assist with thought organization.      Assessment / Recommendations / Plan   Plan Continue with current plan of care      Progression Toward Goals   Progression toward goals Progressing toward goals                SLP Short Term Goals - 12/07/20 1412       SLP SHORT TERM GOAL #1   Title Pt will  recall 3 attention strategies to assist in increasing focus with minA.    Time 2    Period Weeks    Status Revised   11/02/20   Target Date 12/21/20      SLP SHORT TERM GOAL #2   Title Pt will recall 3 memory strategies (external and/or internal) to assist in recall of important information with minA.    Time 2    Period Weeks    Status Revised   11/02/20   Target Date 12/21/20      SLP SHORT TERM GOAL #3   Title Pt will identify problem and solution of given functional scenario with minA.    Time 2    Period Weeks    Status New   To cont goal; goal not met   Target Date 12/21/20      SLP SHORT TERM GOAL #4   Title Pt will use visual aid to increase intiation and understanding when presented with a statement/question given <2 verbal cues during structured task across 2 sessions.    Time 2    Period Weeks    Status New    Target Date 12/21/20              SLP Long Term Goals -  12/07/20 1414       SLP LONG TERM GOAL #1   Title Pt will demonstrate use of 3 attention strategies in structured environment to increase likliehood of using in daily living.    Time 4    Period Weeks    Status On-going   not met last period   Target Date 01/04/21      SLP LONG TERM GOAL #2   Title Pt will demonstrate use of memory aids to assist with recall of important information.    Time 4    Period Weeks    Status On-going   not met last period   Target Date 01/04/21      SLP LONG TERM GOAL #3   Title Pt will organize medication into pill box with minA from pt family.    Time 8    Period Weeks    Status On-going   Not met last session   Target Date 01/04/21      SLP LONG TERM GOAL #4   Title Pt will use visual aid to increase intiation and understanding when presented with a statement/question given <2 verbal cues during unstructured task across 2 sessions.    Time 4    Period Weeks    Status New    Target Date 01/04/21              Plan - 12/07/20 1411     Clinical Impression Statement See tx note. Pt required less visual cues to respond this session. SLP to recertify for additional sessions due to pt progress. Cont. with current POC.    Speech Therapy Frequency 2x / week    Duration 4 weeks    Treatment/Interventions Functional tasks;Patient/family education;Cueing hierarchy;Environmental controls;Cognitive reorganization;Compensatory techniques;Internal/external aids;SLP instruction and feedback;Multimodal communcation approach    Potential to Achieve Goals Good    Consulted and Agree with Plan of Care Patient;Family member/caregiver    Family Member [facility] - son             Patient will benefit from skilled therapeutic intervention in order to improve the following deficits and impairments:   Cognitive communication deficit    Problem List Patient Active Problem List   Diagnosis Date  Noted   Acute ischemic cerebrovascular accident (CVA)  involving anterior cerebral artery territory ([place]) 09/03/2020   Acute CVA (cerebrovascular accident) ([name]) 09/01/2020   Acute ischemic stroke ([place]) 08/31/2020   Aneurysm of anterior communicating artery 08/25/2020   Aneurysm, cerebral, nonruptured 08/25/2020   Microcytic anemia 10/26/2014   Vitamin D deficiency 10/17/2014   Stenosing tenosynovitis of thumb 10/17/2014   Internal hemorrhoids 10/17/2014   History of colon polyps 10/17/2014   Osteoarthritis of carpometacarpal joints of both thumbs 04/04/2014   Pre-diabetes 08/18/2013   History of TIA (transient ischemic attack) 08/12/2013   Routine health maintenance 08/15/2010   Hyperlipidemia 08/05/2008   Obesity 07/30/2007   TOBACCO DEPENDENCE 04/19/2006    Rosann Auerbach [place] MS, [place], CBIS  12/07/2020, 3:11 PM  [place]. [place], [place], [address] Phone: [phone]   Fax:  [phone]   Name: ELETHA CULBERTSON MRN: [id number] Date of Birth: Feb 13, 1961

## 2020-12-07 NOTE — Therapy (Signed)
[place]. [place], [place], [address] Phone: [phone]   Fax:  [phone]  Physical Therapy Treatment  Patient Details  Name: Betty Archer MRN: [id number] Date of Birth: 12-03-1960 Referring Provider (PT): Bary Leriche, [place]   Encounter Date: 12/07/2020   PT End of Session - 12/07/20 1309     Visit Number 15    Number of Visits 17    Date for PT Re-Evaluation 12/21/20    PT Start Time 6979    PT Stop Time 4801    PT Time Calculation (min) 46 min    Equipment Utilized During Treatment Gait belt    Activity Tolerance Patient tolerated treatment well    Behavior During Therapy [facility] for tasks assessed/performed             Past Medical History:  Diagnosis Date   Hyperlipidemia    Stroke [facility])     Past Surgical History:  Procedure Laterality Date   BREAST BIOPSY      Core biopsy done on April 2003   COLONOSCOPY     IR 3D INDEPENDENT WKST  08/25/2020   IR 3D INDEPENDENT WKST  08/25/2020   IR ANGIO INTRA EXTRACRAN SEL INTERNAL CAROTID BILAT MOD SED  08/25/2020   IR ANGIO VERTEBRAL SEL VERTEBRAL BILAT MOD SED  08/25/2020   IR CT HEAD LTD  08/25/2020   IR INTRA CRAN STENT  08/25/2020   IR TRANSCATH/EMBOLIZ  08/25/2020   IR US GUIDE VASC ACCESS RIGHT  08/25/2020   PARTIAL HYSTERECTOMY   10/22/1999    Still has cervix   RADIOLOGY WITH ANESTHESIA N/A 08/25/2020   Procedure: IR WITH ANESTHESIA EMBOLIZATION;  Surgeon: Pedro Earls, MD;  Location: [place];  Service: Radiology;  Laterality: N/A;   TRIGGER FINGER RELEASE Left 12/25/2013   Procedure: LEFT THUMB TRIGGER RELEASE ;  Surgeon: Leanora Cover, MD;  Location: [place];  Service: Orthopedics;  Laterality: Left;    There were no vitals filed for this visit.   Subjective Assessment - 12/07/20 1312     Subjective Pt reports she had a good weekend.    Patient is accompained by: Family member    Pertinent History HLD, repair of right  ACA aneurysm, left MCA aneurysm (unrepaired)    Limitations Sitting;Lifting;Standing;Walking;Writing;House hold activities    Diagnostic tests 09/01/20 brain MRI: Acute Right ACA territory infarct, Ischemia also in the Left ACA territory    Patient Stated Goals patient unable to verbalize goals for therapy but agreeable to work on strengthening    Currently in Pain? No/denies    Pain Onset More than a month ago                               [facility] Adult PT Treatment/Exercise - 12/07/20 0001       Ambulation/Gait   Gait Comments around the back building up and down slopes, trying to maintain a good pace half way she slowed down and then going up the hill very slow pace, she held her leg and back and reported tired and weak, we did not use the assistive device      High Level Balance   High Level Balance Comments airex balance beam tandem walking forward & then backward 2x3 roundtrip in // bars      Knee/Hip Exercises: Aerobic   Recumbent Bike L4 x 5 min  Knee/Hip Exercises: Standing   Hip ADduction Left;Strengthening;Right;10 reps;2 sets    Hip ADduction Limitations green TB; holding onto // bars    Hip Abduction Stengthening;Both;2 sets;10 reps;Knee straight    Abduction Limitations green tband    SLS 3x20 sec      Knee/Hip Exercises: Seated   Other Seated Knee/Hip Exercises ankle DF with green TB 2x10      Knee/Hip Exercises: Supine   Single Leg Bridge Right;Left;2 sets;10 reps;Strengthening    Other Supine Knee/Hip Exercises Hip flexion with green tband 3x10      Knee/Hip Exercises: Sidelying   Hip ADduction --                       PT Short Term Goals - 11/04/20 1551       PT SHORT TERM GOAL #1   Title Patient to be mod I/independent with initial HEP.    Status Achieved               PT Long Term Goals - 12/07/20 1356       PT LONG TERM GOAL #1   Title Patient to be mod I/independent with advanced HEP.    Status  On-going      PT LONG TERM GOAL #2   Title Patient to demonstrate B LE strength >/=4+/5.    Status On-going   weak in L hip flexion, adduction, ankle DF strength     PT LONG TERM GOAL #3   Title Pt will be able to perform L SLS = R SLS    Baseline Currently able to hold L SLS for 13 sec; R for 45 sec (limited due to L hip flexion fatigue)    Status On-going      PT LONG TERM GOAL #4   Title Pt will be able to amb ind for at least 1000' for community amb    Baseline SPC for safety    Status On-going      PT LONG TERM GOAL #5   Status Achieved                   Plan - 12/07/20 1316     Clinical Impression Statement Treatment focused on continuing to work on LE strengthening and increase outdoor amb without a/d. Required rest break after ~5 minutes of walking up incline.    Personal Factors and Comorbidities Age;Comorbidity 3+;Time since onset of injury/illness/exacerbation;Fitness;Transportation    Comorbidities HLD, repair of right ACA aneurysm, left MCA aneurysm (unrepaired)    Examination-Activity Limitations Bathing;Locomotion Level;Transfers;Bend;Carry;Squat;Dressing;Stairs;Stand;Hygiene/Grooming;Lift    Examination-Participation Restrictions Laundry;Shop;Driving;Community Activity;Cleaning;Occupation;Church;Meal Prep    Stability/Clinical Decision Making Evolving/Moderate complexity    PT Treatment/Interventions ADLs/Self Care Home Management;Canalith Repostioning;Cryotherapy;Electrical Stimulation;DME Instruction;Ultrasound;Moist Heat;Gait training;Stair training;Functional mobility training;Therapeutic activities;Therapeutic exercise;Balance training;Neuromuscular re-education;Manual techniques;Patient/family education;Passive range of motion;Dry needling;Energy conservation;Vestibular;Taping    PT Next Visit Plan continue to work on L hip flexion, adduction, ankle DF strength; ankle DF ROM. Wean off SPC, improve single leg stability. Consider using treadmill for walking  endurance.    PT Home Exercise Plan Access Code [email or web address]    Consulted and Agree with Plan of Care Patient             Patient will benefit from skilled therapeutic intervention in order to improve the following deficits and impairments:  Abnormal gait, Decreased coordination, Decreased range of motion, Difficulty walking, Increased fascial restricitons, Dizziness, Decreased activity tolerance, Decreased safety awareness, Pain, Decreased balance, Impaired flexibility, Improper body mechanics, Postural dysfunction, Increased  edema, Decreased strength, Decreased mobility  Visit Diagnosis: Hemiplegia and hemiparesis following cerebral infarction affecting left non-dominant side (HCC)  Muscle weakness (generalized)  Other abnormalities of gait and mobility  Unsteadiness on feet     Problem List Patient Active Problem List   Diagnosis Date Noted   Acute ischemic cerebrovascular accident (CVA) involving anterior cerebral artery territory ([place]) 09/03/2020   Acute CVA (cerebrovascular accident) ([place]) 09/01/2020   Acute ischemic stroke ([place]) 08/31/2020   Aneurysm of anterior communicating artery 08/25/2020   Aneurysm, cerebral, nonruptured 08/25/2020   Microcytic anemia 10/26/2014   Vitamin D deficiency 10/17/2014   Stenosing tenosynovitis of thumb 10/17/2014   Internal hemorrhoids 10/17/2014   History of colon polyps 10/17/2014   Osteoarthritis of carpometacarpal joints of both thumbs 04/04/2014   Pre-diabetes 08/18/2013   History of TIA (transient ischemic attack) 08/12/2013   Routine health maintenance 08/15/2010   Hyperlipidemia 08/05/2008   Obesity 07/30/2007   TOBACCO DEPENDENCE 04/19/2006    Mercades Bajaj April Gordy Levan, PT, DPT 12/07/2020, 1:57 PM  [place] [place]. [place], [place], [address] Phone: [phone]   Fax:  [phone]  Name: LAKARA WEILAND MRN: [id number] Date of Birth: 24-Sep-1960

## 2020-12-09 ENCOUNTER — Ambulatory Visit: Payer: Medicaid Other | Admitting: Physical Therapy

## 2020-12-09 ENCOUNTER — Ambulatory Visit: Payer: Medicaid Other | Admitting: Speech Pathology

## 2020-12-09 ENCOUNTER — Other Ambulatory Visit: Payer: Self-pay

## 2020-12-09 ENCOUNTER — Encounter: Payer: Self-pay | Admitting: Speech Pathology

## 2020-12-09 ENCOUNTER — Encounter: Payer: Self-pay | Admitting: Physical Therapy

## 2020-12-09 DIAGNOSIS — M6281 Muscle weakness (generalized): Secondary | ICD-10-CM

## 2020-12-09 DIAGNOSIS — R2681 Unsteadiness on feet: Secondary | ICD-10-CM

## 2020-12-09 DIAGNOSIS — R41841 Cognitive communication deficit: Secondary | ICD-10-CM

## 2020-12-09 DIAGNOSIS — R2689 Other abnormalities of gait and mobility: Secondary | ICD-10-CM

## 2020-12-09 DIAGNOSIS — I69354 Hemiplegia and hemiparesis following cerebral infarction affecting left non-dominant side: Secondary | ICD-10-CM | POA: Diagnosis not present

## 2020-12-09 DIAGNOSIS — M25672 Stiffness of left ankle, not elsewhere classified: Secondary | ICD-10-CM

## 2020-12-09 NOTE — Therapy (Signed)
[place]. [place], [place], [address] Phone: [phone]   Fax:  [phone]  Physical Therapy Treatment  Patient Details  Name: Betty Archer MRN: [id number] Date of Birth: Nov 13, 1960 Referring Provider (PT): Bary Leriche, [place]   Encounter Date: 12/09/2020   PT End of Session - 12/09/20 1353     Visit Number 16    Date for PT Re-Evaluation 12/21/20    Authorization Type Self Pay    PT Start Time 1315    PT Stop Time 4496    PT Time Calculation (min) 43 min    Activity Tolerance Patient tolerated treatment well    Behavior During Therapy [facility] for tasks assessed/performed             Past Medical History:  Diagnosis Date   Hyperlipidemia    Stroke [facility])     Past Surgical History:  Procedure Laterality Date   BREAST BIOPSY      Core biopsy done on April 2003   COLONOSCOPY     IR 3D INDEPENDENT WKST  08/25/2020   IR 3D INDEPENDENT WKST  08/25/2020   IR ANGIO INTRA EXTRACRAN SEL INTERNAL CAROTID BILAT MOD SED  08/25/2020   IR ANGIO VERTEBRAL SEL VERTEBRAL BILAT MOD SED  08/25/2020   IR CT HEAD LTD  08/25/2020   IR INTRA CRAN STENT  08/25/2020   IR TRANSCATH/EMBOLIZ  08/25/2020   IR US GUIDE VASC ACCESS RIGHT  08/25/2020   PARTIAL HYSTERECTOMY   10/22/1999    Still has cervix   RADIOLOGY WITH ANESTHESIA N/A 08/25/2020   Procedure: IR WITH ANESTHESIA EMBOLIZATION;  Surgeon: Pedro Earls, MD;  Location: [place];  Service: Radiology;  Laterality: N/A;   TRIGGER FINGER RELEASE Left 12/25/2013   Procedure: LEFT THUMB TRIGGER RELEASE ;  Surgeon: Leanora Cover, MD;  Location: [place];  Service: Orthopedics;  Laterality: Left;    There were no vitals filed for this visit.   Subjective Assessment - 12/09/20 1325     Subjective No falls, reports that she thinks that she needs to work on balance.    Currently in Pain? No/denies                               [facility]  Adult PT Treatment/Exercise - 12/09/20 0001       Ambulation/Gait   Gait Comments no device, around the building in the back, cues for pace, slopes up and down and curbs, really slows when going up the hill      High Level Balance   High Level Balance Comments walking ball toss, stepping over objects onto airex, on airex ball toss, on airex eyes closed, on airex head turns, SLS balance 5 seconds at a time, airex balance beam tandem and side stepping in the Pbars trying to not use hands      Knee/Hip Exercises: Aerobic   Nustep level 5 x 6 minutes      Knee/Hip Exercises: Machines for Strengthening   Cybex Knee Extension 15# 2x15    Cybex Knee Flexion 35# 2x15      Knee/Hip Exercises: Seated   Knee/Hip Flexion sit to stand with weighted ball push 3x 5                       PT Short Term Goals - 11/04/20 1551  PT SHORT TERM GOAL #1   Title Patient to be mod I/independent with initial HEP.    Status Achieved               PT Long Term Goals - 12/09/20 1355       PT LONG TERM GOAL #2   Title Patient to demonstrate B LE strength >/=4+/5.    Status Partially Met      PT LONG TERM GOAL #3   Title Pt will be able to perform L SLS = R SLS    Status Partially Met                   Plan - 12/09/20 1354     Clinical Impression Statement Patient tells me that she feels like the balance is her biggest issue, she definitely slows coming up the hill and has difficulty with this.  She really does well with most of the balance activities, slight difficulty on the dynamic surfaces    PT Next Visit Plan balance and endurance    Consulted and Agree with Plan of Care Patient             Patient will benefit from skilled therapeutic intervention in order to improve the following deficits and impairments:  Abnormal gait, Decreased coordination, Decreased range of motion, Difficulty walking, Increased fascial restricitons, Dizziness, Decreased activity  tolerance, Decreased safety awareness, Pain, Decreased balance, Impaired flexibility, Improper body mechanics, Postural dysfunction, Increased edema, Decreased strength, Decreased mobility  Visit Diagnosis: Hemiplegia and hemiparesis following cerebral infarction affecting left non-dominant side (HCC)  Muscle weakness (generalized)  Other abnormalities of gait and mobility  Unsteadiness on feet  Stiffness of left ankle, not elsewhere classified     Problem List Patient Active Problem List   Diagnosis Date Noted   Acute ischemic cerebrovascular accident (CVA) involving anterior cerebral artery territory ([place]) 09/03/2020   Acute CVA (cerebrovascular accident) ([place]) 09/01/2020   Acute ischemic stroke ([place]) 08/31/2020   Aneurysm of anterior communicating artery 08/25/2020   Aneurysm, cerebral, nonruptured 08/25/2020   Microcytic anemia 10/26/2014   Vitamin D deficiency 10/17/2014   Stenosing tenosynovitis of thumb 10/17/2014   Internal hemorrhoids 10/17/2014   History of colon polyps 10/17/2014   Osteoarthritis of carpometacarpal joints of both thumbs 04/04/2014   Pre-diabetes 08/18/2013   History of TIA (transient ischemic attack) 08/12/2013   Routine health maintenance 08/15/2010   Hyperlipidemia 08/05/2008   Obesity 07/30/2007   TOBACCO DEPENDENCE 04/19/2006    Sumner Boast, PT 12/09/2020, 1:57 PM  [place]. [place], [place], [address] Phone: [phone]   Fax:  [phone]  Name: Betty Archer MRN: [id number] Date of Birth: [birth year]

## 2020-12-09 NOTE — Therapy (Signed)
[place]. [place], [place], [address] Phone: [phone]   Fax:  [phone]  Speech Language Pathology Treatment  Patient Details  Name: Betty Archer MRN: [id number] Date of Birth: 1961-01-05 Referring Provider (SLP): Reesa Chew [place]   Encounter Date: 12/09/2020   End of Session - 12/09/20 1440     Number of Visits 17    Date for SLP Re-Evaluation 01/04/21    Authorization - Visit Number 1    Authorization - Number of Visits 8    SLP Start Time 1400    SLP Stop Time  1440    SLP Time Calculation (min) 40 min    Activity Tolerance Patient tolerated treatment well             Past Medical History:  Diagnosis Date   Hyperlipidemia    Stroke [facility])     Past Surgical History:  Procedure Laterality Date   BREAST BIOPSY      Core biopsy done on April 2003   COLONOSCOPY     IR 3D INDEPENDENT WKST  08/25/2020   IR 3D INDEPENDENT WKST  08/25/2020   IR ANGIO INTRA EXTRACRAN SEL INTERNAL CAROTID BILAT MOD SED  08/25/2020   IR ANGIO VERTEBRAL SEL VERTEBRAL BILAT MOD SED  08/25/2020   IR CT HEAD LTD  08/25/2020   IR INTRA CRAN STENT  08/25/2020   IR TRANSCATH/EMBOLIZ  08/25/2020   IR US GUIDE VASC ACCESS RIGHT  08/25/2020   PARTIAL HYSTERECTOMY   10/22/1999    Still has cervix   RADIOLOGY WITH ANESTHESIA N/A 08/25/2020   Procedure: IR WITH ANESTHESIA EMBOLIZATION;  Surgeon: Pedro Earls, MD;  Location: [place];  Service: Radiology;  Laterality: N/A;   TRIGGER FINGER RELEASE Left 12/25/2013   Procedure: LEFT THUMB TRIGGER RELEASE ;  Surgeon: Leanora Cover, MD;  Location: [place];  Service: Orthopedics;  Laterality: Left;    There were no vitals filed for this visit.   Subjective Assessment - 12/09/20 1440     Subjective "How ya'll been?"    Currently in Pain? No/denies                   ADULT SLP TREATMENT - 12/09/20 1618       General Information   Behavior/Cognition  Cooperative;Pleasant mood      Treatment Provided   Treatment provided Cognitive-Linquistic      Cognitive-Linquistic Treatment   Treatment focused on Cognition    Skilled Treatment Pt reports success with using active listening skills in conversation with family. Pt practiced using sustained attention in a categorization activity. She required minA to complete this task. Pt benefited from verbal cues to complete this task. SLP also instructed pt to utilize active listening skills (asking for clarification) while answering reading comprehension questions. Pt required minA to use active listening strategies apporpriately. SLP encouraged pt to implement active listening skills in daily conversations.      Assessment / Recommendations / Plan   Plan Continue with current plan of care      Progression Toward Goals   Progression toward goals Progressing toward goals                SLP Short Term Goals - 12/07/20 1412       SLP SHORT TERM GOAL #1   Title Pt will recall 3 attention strategies to assist in increasing focus with minA.    Time 2  Period Weeks    Status Revised   11/02/20   Target Date 12/21/20      SLP SHORT TERM GOAL #2   Title Pt will recall 3 memory strategies (external and/or internal) to assist in recall of important information with minA.    Time 2    Period Weeks    Status Revised   11/02/20   Target Date 12/21/20      SLP SHORT TERM GOAL #3   Title Pt will identify problem and solution of given functional scenario with minA.    Time 2    Period Weeks    Status New   To cont goal; goal not met   Target Date 12/21/20      SLP SHORT TERM GOAL #4   Title Pt will use visual aid to increase intiation and understanding when presented with a statement/question given <2 verbal cues during structured task across 2 sessions.    Time 2    Period Weeks    Status New    Target Date 12/21/20              SLP Long Term Goals - 12/07/20 1414       SLP LONG  TERM GOAL #1   Title Pt will demonstrate use of 3 attention strategies in structured environment to increase likliehood of using in daily living.    Time 4    Period Weeks    Status On-going   not met last period   Target Date 01/04/21      SLP LONG TERM GOAL #2   Title Pt will demonstrate use of memory aids to assist with recall of important information.    Time 4    Period Weeks    Status On-going   not met last period   Target Date 01/04/21      SLP LONG TERM GOAL #3   Title Pt will organize medication into pill box with minA from pt family.    Time 8    Period Weeks    Status On-going   Not met last session   Target Date 01/04/21      SLP LONG TERM GOAL #4   Title Pt will use visual aid to increase intiation and understanding when presented with a statement/question given <2 verbal cues during unstructured task across 2 sessions.    Time 4    Period Weeks    Status New    Target Date 01/04/21              Plan - 12/09/20 1441     Clinical Impression Statement See tx note. Pt continues to make progress in processing information and verbal expression. Cont. with current POC.    Speech Therapy Frequency 2x / week    Duration 4 weeks    Treatment/Interventions Functional tasks;Patient/family education;Cueing hierarchy;Environmental controls;Cognitive reorganization;Compensatory techniques;Internal/external aids;SLP instruction and feedback;Multimodal communcation approach    Potential to Achieve Goals Good    Consulted and Agree with Plan of Care Patient;Family member/caregiver    Family Member [facility] - son             Patient will benefit from skilled therapeutic intervention in order to improve the following deficits and impairments:   Cognitive communication deficit    Problem List Patient Active Problem List   Diagnosis Date Noted   Acute ischemic cerebrovascular accident (CVA) involving anterior cerebral artery territory ([place]) 09/03/2020    Acute CVA (cerebrovascular accident) ([place]) 09/01/2020   Acute  ischemic stroke ([place]) 08/31/2020   Aneurysm of anterior communicating artery 08/25/2020   Aneurysm, cerebral, nonruptured 08/25/2020   Microcytic anemia 10/26/2014   Vitamin D deficiency 10/17/2014   Stenosing tenosynovitis of thumb 10/17/2014   Internal hemorrhoids 10/17/2014   History of colon polyps 10/17/2014   Osteoarthritis of carpometacarpal joints of both thumbs 04/04/2014   Pre-diabetes 08/18/2013   History of TIA (transient ischemic attack) 08/12/2013   Routine health maintenance 08/15/2010   Hyperlipidemia 08/05/2008   Obesity 07/30/2007   TOBACCO DEPENDENCE 04/19/2006    Danise Mina B.S. Communication Sciences and Disorders  12/09/2020, 4:36 PM  [place]. [place], [place], [address] Phone: [phone]   Fax:  [phone]   Name: Betty Archer MRN: [id number] Date of Birth: 05-May-1960

## 2020-12-14 ENCOUNTER — Encounter: Payer: Self-pay | Admitting: Physical Therapy

## 2020-12-14 ENCOUNTER — Ambulatory Visit: Payer: Medicaid Other | Admitting: Speech Pathology

## 2020-12-14 ENCOUNTER — Other Ambulatory Visit: Payer: Self-pay

## 2020-12-14 ENCOUNTER — Ambulatory Visit: Payer: Medicaid Other | Admitting: Physical Therapy

## 2020-12-14 DIAGNOSIS — R41841 Cognitive communication deficit: Secondary | ICD-10-CM

## 2020-12-14 DIAGNOSIS — I69354 Hemiplegia and hemiparesis following cerebral infarction affecting left non-dominant side: Secondary | ICD-10-CM

## 2020-12-14 DIAGNOSIS — M6281 Muscle weakness (generalized): Secondary | ICD-10-CM

## 2020-12-14 DIAGNOSIS — M25672 Stiffness of left ankle, not elsewhere classified: Secondary | ICD-10-CM

## 2020-12-14 DIAGNOSIS — R2689 Other abnormalities of gait and mobility: Secondary | ICD-10-CM

## 2020-12-14 DIAGNOSIS — R2681 Unsteadiness on feet: Secondary | ICD-10-CM

## 2020-12-14 NOTE — Therapy (Signed)
[place]. [place], [place], [address] Phone: [phone]   Fax:  [phone]  Physical Therapy Treatment  Patient Details  Name: Betty Archer MRN: [id number] Date of Birth: January 21, 1961 Referring Provider (PT): Bary Leriche, [place]   Encounter Date: 12/14/2020   PT End of Session - 12/14/20 1325     Visit Number 17    Date for PT Re-Evaluation 12/21/20    Authorization Type Self Pay    PT Start Time 1321   late arrival   PT Stop Time 1400    PT Time Calculation (min) 39 min    Activity Tolerance Patient tolerated treatment well    Behavior During Therapy [facility] for tasks assessed/performed             Past Medical History:  Diagnosis Date   Hyperlipidemia    Stroke [facility])     Past Surgical History:  Procedure Laterality Date   BREAST BIOPSY      Core biopsy done on April 2003   COLONOSCOPY     IR 3D INDEPENDENT WKST  08/25/2020   IR 3D INDEPENDENT WKST  08/25/2020   IR ANGIO INTRA EXTRACRAN SEL INTERNAL CAROTID BILAT MOD SED  08/25/2020   IR ANGIO VERTEBRAL SEL VERTEBRAL BILAT MOD SED  08/25/2020   IR CT HEAD LTD  08/25/2020   IR INTRA CRAN STENT  08/25/2020   IR TRANSCATH/EMBOLIZ  08/25/2020   IR US GUIDE VASC ACCESS RIGHT  08/25/2020   PARTIAL HYSTERECTOMY   10/22/1999    Still has cervix   RADIOLOGY WITH ANESTHESIA N/A 08/25/2020   Procedure: IR WITH ANESTHESIA EMBOLIZATION;  Surgeon: Pedro Earls, MD;  Location: [facility];  Service: Radiology;  Laterality: N/A;   TRIGGER FINGER RELEASE Left 12/25/2013   Procedure: LEFT THUMB TRIGGER RELEASE ;  Surgeon: Leanora Cover, MD;  Location: [facility];  Service: Orthopedics;  Laterality: Left;    There were no vitals filed for this visit.   Subjective Assessment - 12/14/20 1323     Subjective Pt reports trying to walk more without the cane.    Patient is accompained by: Family member    Pertinent History HLD, repair of right ACA aneurysm,  left MCA aneurysm (unrepaired)    Limitations Sitting;Lifting;Standing;Walking;Writing;House hold activities    Diagnostic tests 09/01/20 brain MRI: Acute Right ACA territory infarct, Ischemia also in the Left ACA territory    Patient Stated Goals patient unable to verbalize goals for therapy but agreeable to work on strengthening    Currently in Pain? No/denies                               [facility] Adult PT Treatment/Exercise - 12/14/20 0001       Neuro Re-ed    Neuro Re-ed Details  Tandem stance on airex 2x30 sec; on airex balance beam tandem walking with lateral cone tapping x2 trips; on airex balance beam side stepping with cone tapping x2 trips      Knee/Hip Exercises: Aerobic   Tread Mill 1.2 speed 2x4 min 0 incline. 2x2 min 3.5 incline.      Knee/Hip Exercises: Standing   Rocker Board Limitations --    SLS 2x30 sec    Other Standing Knee Exercises Diver to chair 2x10  PT Short Term Goals - 11/04/20 1551       PT SHORT TERM GOAL #1   Title Patient to be mod I/independent with initial HEP.    Status Achieved               PT Long Term Goals - 12/09/20 1355       PT LONG TERM GOAL #2   Title Patient to demonstrate B LE strength >/=4+/5.    Status Partially Met      PT LONG TERM GOAL #3   Title Pt will be able to perform L SLS = R SLS    Status Partially Met                   Plan - 12/14/20 1345     Clinical Impression Statement Treatment focused on high level balance activities/improving SLS. L hip fatigues when flexed in standing (only able to perform ~30 sec). Continued to work on walking endurance and inclines on treadmill.    Personal Factors and Comorbidities Age;Comorbidity 3+;Time since onset of injury/illness/exacerbation;Fitness;Transportation    Comorbidities HLD, repair of right ACA aneurysm, left MCA aneurysm (unrepaired)    Examination-Activity Limitations Bathing;Locomotion  Level;Transfers;Bend;Carry;Squat;Dressing;Stairs;Stand;Hygiene/Grooming;Lift    PT Frequency 2x / week    PT Duration 3 weeks    PT Treatment/Interventions ADLs/Self Care Home Management;Canalith Repostioning;Cryotherapy;Electrical Stimulation;DME Instruction;Ultrasound;Moist Heat;Gait training;Stair training;Functional mobility training;Therapeutic activities;Therapeutic exercise;Balance training;Neuromuscular re-education;Manual techniques;Patient/family education;Passive range of motion;Dry needling;Energy conservation;Vestibular;Taping    PT Next Visit Plan balance and endurance    PT Home Exercise Plan Access Code [email or web address]    Consulted and Agree with Plan of Care Patient             Patient will benefit from skilled therapeutic intervention in order to improve the following deficits and impairments:  Abnormal gait, Decreased coordination, Decreased range of motion, Difficulty walking, Increased fascial restricitons, Dizziness, Decreased activity tolerance, Decreased safety awareness, Pain, Decreased balance, Impaired flexibility, Improper body mechanics, Postural dysfunction, Increased edema, Decreased strength, Decreased mobility  Visit Diagnosis: Hemiplegia and hemiparesis following cerebral infarction affecting left non-dominant side (HCC)  Muscle weakness (generalized)  Other abnormalities of gait and mobility  Unsteadiness on feet  Stiffness of left ankle, not elsewhere classified     Problem List Patient Active Problem List   Diagnosis Date Noted   Acute ischemic cerebrovascular accident (CVA) involving anterior cerebral artery territory ([place]) 09/03/2020   Acute CVA (cerebrovascular accident) ([place]) 09/01/2020   Acute ischemic stroke ([place]) 08/31/2020   Aneurysm of anterior communicating artery 08/25/2020   Aneurysm, cerebral, nonruptured 08/25/2020   Microcytic anemia 10/26/2014   Vitamin D deficiency 10/17/2014   Stenosing tenosynovitis of thumb 10/17/2014    Internal hemorrhoids 10/17/2014   History of colon polyps 10/17/2014   Osteoarthritis of carpometacarpal joints of both thumbs 04/04/2014   Pre-diabetes 08/18/2013   History of TIA (transient ischemic attack) 08/12/2013   Routine health maintenance 08/15/2010   Hyperlipidemia 08/05/2008   Obesity 07/30/2007   TOBACCO DEPENDENCE 04/19/2006    Daire Okimoto April Gordy Levan, PT DPT 12/14/2020, 2:00 PM  [place]. [place], [place], [address] Phone: [phone]   Fax:  [phone]  Name: CHAUNTELLE AZPEITIA MRN: [id number] Date of Birth: Apr 19, 1960

## 2020-12-14 NOTE — Therapy (Addendum)
[place]. [place], [place], [address] Phone: [phone]   Fax:  [phone]  Speech Language Pathology Treatment  Patient Details  Name: Betty Archer MRN: [id number] Date of Birth: 12/09/1960 Referring Provider (SLP): Reesa Chew [place]   Encounter Date: 12/14/2020   End of Session - 12/14/20 1606     Number of Visits 17    Date for SLP Re-Evaluation 01/04/21    Authorization - Visit Number 2    Authorization - Number of Visits 8    SLP Start Time 1400    SLP Stop Time  1440    SLP Time Calculation (min) 40 min    Activity Tolerance Patient tolerated treatment well             Past Medical History:  Diagnosis Date   Hyperlipidemia    Stroke [facility])     Past Surgical History:  Procedure Laterality Date   BREAST BIOPSY      Core biopsy done on April 2003   COLONOSCOPY     IR 3D INDEPENDENT WKST  08/25/2020   IR 3D INDEPENDENT WKST  08/25/2020   IR ANGIO INTRA EXTRACRAN SEL INTERNAL CAROTID BILAT MOD SED  08/25/2020   IR ANGIO VERTEBRAL SEL VERTEBRAL BILAT MOD SED  08/25/2020   IR CT HEAD LTD  08/25/2020   IR INTRA CRAN STENT  08/25/2020   IR TRANSCATH/EMBOLIZ  08/25/2020   IR US GUIDE VASC ACCESS RIGHT  08/25/2020   PARTIAL HYSTERECTOMY   10/22/1999    Still has cervix   RADIOLOGY WITH ANESTHESIA N/A 08/25/2020   Procedure: IR WITH ANESTHESIA EMBOLIZATION;  Surgeon: Pedro Earls, MD;  Location: [facility];  Service: Radiology;  Laterality: N/A;   TRIGGER FINGER RELEASE Left 12/25/2013   Procedure: LEFT THUMB TRIGGER RELEASE ;  Surgeon: Leanora Cover, MD;  Location: [facility];  Service: Orthopedics;  Laterality: Left;    There were no vitals filed for this visit.   Subjective Assessment - 12/14/20 1606     Subjective Pt reports no new changes.    Currently in Pain? No/denies                   ADULT SLP TREATMENT - 12/14/20 1444       General Information    Behavior/Cognition Cooperative;Pleasant mood      Treatment Provided   Treatment provided Cognitive-Linquistic      Cognitive-Linquistic Treatment   Treatment focused on Cognition    Skilled Treatment Pt was able to recall WRAP memory strategies 4/4 when asked by SLP. Pt reports that communication in conversation "feels slow" and "requires time for processing to catch up". SLP encouraged pt to tell communication partners that she needs extra time in conversations. Pt was instructed to fill a medication box during a medication management activity. Pt was given a medication box (SUN AM & PM -SAT AM & PM) and 3 bottles with replicated tablets. Pt required modA to complete this task and required verbal cues in order to be reminded to fill in box.      Assessment / Recommendations / Plan   Plan Continue with current plan of care      Progression Toward Goals   Progression toward goals Progressing toward goals                SLP Short Term Goals - 12/07/20 1412       SLP SHORT TERM GOAL #1  Title Pt will recall 3 attention strategies to assist in increasing focus with minA.    Time 2    Period Weeks    Status Revised   11/02/20   Target Date 12/21/20      SLP SHORT TERM GOAL #2   Title Pt will recall 3 memory strategies (external and/or internal) to assist in recall of important information with minA.    Time 2    Period Weeks    Status Revised   11/02/20   Target Date 12/21/20      SLP SHORT TERM GOAL #3   Title Pt will identify problem and solution of given functional scenario with minA.    Time 2    Period Weeks    Status New   To cont goal; goal not met   Target Date 12/21/20      SLP SHORT TERM GOAL #4   Title Pt will use visual aid to increase intiation and understanding when presented with a statement/question given <2 verbal cues during structured task across 2 sessions.    Time 2    Period Weeks    Status New    Target Date 12/21/20              SLP Long  Term Goals - 12/07/20 1414       SLP LONG TERM GOAL #1   Title Pt will demonstrate use of 3 attention strategies in structured environment to increase likliehood of using in daily living.    Time 4    Period Weeks    Status On-going   not met last period   Target Date 01/04/21      SLP LONG TERM GOAL #2   Title Pt will demonstrate use of memory aids to assist with recall of important information.    Time 4    Period Weeks    Status On-going   not met last period   Target Date 01/04/21      SLP LONG TERM GOAL #3   Title Pt will organize medication into pill box with minA from pt family.    Time 8    Period Weeks    Status On-going   Not met last session   Target Date 01/04/21      SLP LONG TERM GOAL #4   Title Pt will use visual aid to increase intiation and understanding when presented with a statement/question given <2 verbal cues during unstructured task across 2 sessions.    Time 4    Period Weeks    Status New    Target Date 01/04/21              Plan - 12/14/20 1607     Clinical Impression Statement See tx note. Pt continues to make progress in processing information and verbal expression. Cont. with current POC.    Speech Therapy Frequency 2x / week    Duration 4 weeks    Treatment/Interventions Functional tasks;Patient/family education;Cueing hierarchy;Environmental controls;Cognitive reorganization;Compensatory techniques;Internal/external aids;SLP instruction and feedback;Multimodal communcation approach    Potential to Achieve Goals Good    Consulted and Agree with Plan of Care Patient;Family member/caregiver    Family Member [facility] - son             Patient will benefit from skilled therapeutic intervention in order to improve the following deficits and impairments:   Cognitive communication deficit    Problem List Patient Active Problem List   Diagnosis Date Noted   Acute ischemic  cerebrovascular accident (CVA) involving anterior  cerebral artery territory ([place]) 09/03/2020   Acute CVA (cerebrovascular accident) ([place]) 09/01/2020   Acute ischemic stroke ([place]) 08/31/2020   Aneurysm of anterior communicating artery 08/25/2020   Aneurysm, cerebral, nonruptured 08/25/2020   Microcytic anemia 10/26/2014   Vitamin D deficiency 10/17/2014   Stenosing tenosynovitis of thumb 10/17/2014   Internal hemorrhoids 10/17/2014   History of colon polyps 10/17/2014   Osteoarthritis of carpometacarpal joints of both thumbs 04/04/2014   Pre-diabetes 08/18/2013   History of TIA (transient ischemic attack) 08/12/2013   Routine health maintenance 08/15/2010   Hyperlipidemia 08/05/2008   Obesity 07/30/2007   TOBACCO DEPENDENCE 04/19/2006    Sydney L Ottawa B.S. Communication Sciences and Disorders  12/14/2020, 4:08 PM  [place]. [place], [place], [address] Phone: [phone]   Fax:  [phone]   Name: HARRIETTE TOVEY MRN: [id number] Date of Birth: Aug 10, 1960

## 2020-12-16 ENCOUNTER — Other Ambulatory Visit: Payer: Self-pay

## 2020-12-16 ENCOUNTER — Ambulatory Visit: Payer: Medicaid Other | Admitting: Physical Therapy

## 2020-12-16 ENCOUNTER — Encounter: Payer: Self-pay | Admitting: Speech Pathology

## 2020-12-16 ENCOUNTER — Ambulatory Visit: Payer: Medicaid Other | Admitting: Speech Pathology

## 2020-12-16 ENCOUNTER — Encounter: Payer: Self-pay | Admitting: Physical Therapy

## 2020-12-16 DIAGNOSIS — R41841 Cognitive communication deficit: Secondary | ICD-10-CM

## 2020-12-16 DIAGNOSIS — I69354 Hemiplegia and hemiparesis following cerebral infarction affecting left non-dominant side: Secondary | ICD-10-CM

## 2020-12-16 DIAGNOSIS — R2681 Unsteadiness on feet: Secondary | ICD-10-CM

## 2020-12-16 DIAGNOSIS — M25672 Stiffness of left ankle, not elsewhere classified: Secondary | ICD-10-CM

## 2020-12-16 DIAGNOSIS — M6281 Muscle weakness (generalized): Secondary | ICD-10-CM

## 2020-12-16 DIAGNOSIS — R2689 Other abnormalities of gait and mobility: Secondary | ICD-10-CM

## 2020-12-16 NOTE — Therapy (Signed)
[place]. [place], [place], [address] Phone: [phone]   Fax:  [phone]  Speech Language Pathology Treatment  Patient Details  Name: Betty Archer MRN: [id number] Date of Birth: [birth year] Referring Provider (SLP): Reesa Chew [place]   Encounter Date: 12/16/2020   End of Session - 12/16/20 1508     Number of Visits 17    Date for SLP Re-Evaluation 01/04/21    Authorization - Visit Number 3    Authorization - Number of Visits 8    Activity Tolerance Patient tolerated treatment well             Past Medical History:  Diagnosis Date   Hyperlipidemia    Stroke [facility])     Past Surgical History:  Procedure Laterality Date   BREAST BIOPSY      Core biopsy done on April 2003   COLONOSCOPY     IR 3D INDEPENDENT WKST  08/25/2020   IR 3D INDEPENDENT WKST  08/25/2020   IR ANGIO INTRA EXTRACRAN SEL INTERNAL CAROTID BILAT MOD SED  08/25/2020   IR ANGIO VERTEBRAL SEL VERTEBRAL BILAT MOD SED  08/25/2020   IR CT HEAD LTD  08/25/2020   IR INTRA CRAN STENT  08/25/2020   IR TRANSCATH/EMBOLIZ  08/25/2020   IR US GUIDE VASC ACCESS RIGHT  08/25/2020   PARTIAL HYSTERECTOMY   10/22/1999    Still has cervix   RADIOLOGY WITH ANESTHESIA N/A 08/25/2020   Procedure: IR WITH ANESTHESIA EMBOLIZATION;  Surgeon: Pedro Earls, MD;  Location: [facility];  Service: Radiology;  Laterality: N/A;   TRIGGER FINGER RELEASE Left 12/25/2013   Procedure: LEFT THUMB TRIGGER RELEASE ;  Surgeon: Leanora Cover, MD;  Location: [facility];  Service: Orthopedics;  Laterality: Left;    There were no vitals filed for this visit.   Subjective Assessment - 12/16/20 1444     Subjective Pt reports no new changes.    Currently in Pain? No/denies                   ADULT SLP TREATMENT - 12/16/20 1513       General Information   Behavior/Cognition Cooperative;Pleasant mood      Treatment Provided   Treatment provided  Cognitive-Linquistic      Cognitive-Linquistic Treatment   Treatment focused on Cognition    Skilled Treatment When asked, pt was able to recall WRAP memory strategies (4/4) independently. Pt was trained in using "chunking" memory strategy in order to practice real life work scenarios. 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 Pt was instructed to utilize chunking by grouping clothing items and sets of numbers given by the SLP in order to assist with recall. Pt was able to recall up to 7 clothing items and approximately 6 numbers during task with minA.      Assessment / Recommendations / Plan   Plan Continue with current plan of care      Progression Toward Goals   Progression toward goals Progressing toward goals                SLP Short Term Goals - 12/07/20 1412       SLP SHORT TERM GOAL #1   Title Pt will recall 3 attention strategies to assist in increasing focus with minA.    Time 2    Period Weeks    Status Revised   11/02/20  Target Date 12/21/20      SLP SHORT TERM GOAL #2   Title Pt will recall 3 memory strategies (external and/or internal) to assist in recall of important information with minA.    Time 2    Period Weeks    Status Revised   11/02/20   Target Date 12/21/20      SLP SHORT TERM GOAL #3   Title Pt will identify problem and solution of given functional scenario with minA.    Time 2    Period Weeks    Status New   To cont goal; goal not met   Target Date 12/21/20      SLP SHORT TERM GOAL #4   Title Pt will use visual aid to increase intiation and understanding when presented with a statement/question given <2 verbal cues during structured task across 2 sessions.    Time 2    Period Weeks    Status New    Target Date 12/21/20              SLP Long Term Goals - 12/07/20 1414       SLP LONG TERM GOAL #1   Title Pt will demonstrate use of 3 attention strategies in structured environment to  increase likliehood of using in daily living.    Time 4    Period Weeks    Status On-going   not met last period   Target Date 01/04/21      SLP LONG TERM GOAL #2   Title Pt will demonstrate use of memory aids to assist with recall of important information.    Time 4    Period Weeks    Status On-going   not met last period   Target Date 01/04/21      SLP LONG TERM GOAL #3   Title Pt will organize medication into pill box with minA from pt family.    Time 8    Period Weeks    Status On-going   Not met last session   Target Date 01/04/21      SLP LONG TERM GOAL #4   Title Pt will use visual aid to increase intiation and understanding when presented with a statement/question given <2 verbal cues during unstructured task across 2 sessions.    Time 4    Period Weeks    Status New    Target Date 01/04/21              Plan - 12/16/20 1509     Clinical Impression Statement See tx note. Pt continues to make progress in processing information and verbal expression. Cont. with current POC.    Speech Therapy Frequency 2x / week    Duration 4 weeks    Treatment/Interventions Functional tasks;Patient/family education;Cueing hierarchy;Environmental controls;Cognitive reorganization;Compensatory techniques;Internal/external aids;SLP instruction and feedback;Multimodal communcation approach    Potential to Achieve Goals Good    Consulted and Agree with Plan of Care Patient;Family member/caregiver    Family Member [facility] - son             Patient will benefit from skilled therapeutic intervention in order to improve the following deficits and impairments:   Cognitive communication deficit    Problem List Patient Active Problem List   Diagnosis Date Noted   Acute ischemic cerebrovascular accident (CVA) involving anterior cerebral artery territory ([place]) 09/03/2020   Acute CVA (cerebrovascular accident) ([place]) 09/01/2020   Acute ischemic stroke ([place]) 08/31/2020    Aneurysm of anterior communicating artery 08/25/2020  Aneurysm, cerebral, nonruptured 08/25/2020   Microcytic anemia 10/26/2014   Vitamin D deficiency 10/17/2014   Stenosing tenosynovitis of thumb 10/17/2014   Internal hemorrhoids 10/17/2014   History of colon polyps 10/17/2014   Osteoarthritis of carpometacarpal joints of both thumbs 04/04/2014   Pre-diabetes 08/18/2013   History of TIA (transient ischemic attack) 08/12/2013   Routine health maintenance 08/15/2010   Hyperlipidemia 08/05/2008   Obesity 07/30/2007   TOBACCO DEPENDENCE 04/19/2006    Danise Mina B.S. Communication Sciences and Disorders  12/16/2020, 3:22 PM  [place]. [place], [place], [address] Phone: [phone]   Fax:  [phone]   Name: Betty Archer MRN: [id number] Date of Birth: 1960/03/24

## 2020-12-16 NOTE — Therapy (Signed)
[address] [place], [place], [address] Phone: [phone]   Fax:  [phone]  Physical Therapy Treatment  Patient Details  Name: Betty Archer MRN: [id number] Date of Birth: 1960/05/23 Referring Provider (PT): Bary Leriche, [place]   Encounter Date: 12/16/2020   PT End of Session - 12/16/20 1528     Visit Number 18    Date for PT Re-Evaluation 12/21/20    Authorization Type Self Pay    PT Start Time 1315    PT Stop Time 7588    PT Time Calculation (min) 43 min    Activity Tolerance Patient tolerated treatment well    Behavior During Therapy [facility] for tasks assessed/performed             Past Medical History:  Diagnosis Date   Hyperlipidemia    Stroke [facility])     Past Surgical History:  Procedure Laterality Date   BREAST BIOPSY      Core biopsy done on April 2003   COLONOSCOPY     IR 3D INDEPENDENT WKST  08/25/2020   IR 3D INDEPENDENT WKST  08/25/2020   IR ANGIO INTRA EXTRACRAN SEL INTERNAL CAROTID BILAT MOD SED  08/25/2020   IR ANGIO VERTEBRAL SEL VERTEBRAL BILAT MOD SED  08/25/2020   IR CT HEAD LTD  08/25/2020   IR INTRA CRAN STENT  08/25/2020   IR TRANSCATH/EMBOLIZ  08/25/2020   IR US GUIDE VASC ACCESS RIGHT  08/25/2020   PARTIAL HYSTERECTOMY   10/22/1999    Still has cervix   RADIOLOGY WITH ANESTHESIA N/A 08/25/2020   Procedure: IR WITH ANESTHESIA EMBOLIZATION;  Surgeon: Pedro Earls, MD;  Location: [place];  Service: Radiology;  Laterality: N/A;   TRIGGER FINGER RELEASE Left 12/25/2013   Procedure: LEFT THUMB TRIGGER RELEASE ;  Surgeon: Leanora Cover, MD;  Location: [place];  Service: Orthopedics;  Laterality: Left;    There were no vitals filed for this visit.   Subjective Assessment - 12/16/20 1321     Subjective Reports that she has not fallen and feels pretty good.    Currently in Pain? No/denies                               [facility] Adult PT  Treatment/Exercise - 12/16/20 0001       Ambulation/Gait   Gait Comments gait around the back building, then in grass, slopes, pine needles, curbs etc, she reported fatigue but never really had any loss of balance, walked in the grass, had one instance when she stepped in a small hole and she was startled but no near fall she was able to keep going      High Level Balance   High Level Balance Comments standing on airex weight toss into box using the left hand, then with one foot on a 6" box and doing the same each foot, aires cone toe touches      Neuro Re-ed    Neuro Re-ed Details  Tandem stance on airex 2x30 sec; on airex balance beam tandem walking with lateral cone tapping x2 trips; on airex balance beam side stepping with cone tapping x2 trips      Knee/Hip Exercises: Aerobic   Nustep level 5 x 6 minutes      Knee/Hip Exercises: Machines for Strengthening   Cybex Knee Extension 15# 2x15    Cybex Knee Flexion 35# 2x15  PT Short Term Goals - 11/04/20 1551       PT SHORT TERM GOAL #1   Title Patient to be mod I/independent with initial HEP.    Status Achieved               PT Long Term Goals - 12/16/20 1530       PT LONG TERM GOAL #1   Title Patient to be mod I/independent with advanced HEP.    Status Partially Met      PT LONG TERM GOAL #2   Title Patient to demonstrate B LE strength >/=4+/5.    Status Partially Met      PT LONG TERM GOAL #3   Title Pt will be able to perform L SLS = R SLS    Status Partially Met                   Plan - 12/16/20 1528     Clinical Impression Statement Patient did well with the walking one instance outside in the grass but she was able to self right, fatigue seems to be the biggest issue with walking, we did not use the cane today.  The other issue I saw today was one leg on the airex and one on the 6" step and having her toss this was difficult and needed a few times of min A to  correct    PT Next Visit Plan balance and endurance    Consulted and Agree with Plan of Care Patient             Patient will benefit from skilled therapeutic intervention in order to improve the following deficits and impairments:  Abnormal gait, Decreased coordination, Decreased range of motion, Difficulty walking, Increased fascial restricitons, Dizziness, Decreased activity tolerance, Decreased safety awareness, Pain, Decreased balance, Impaired flexibility, Improper body mechanics, Postural dysfunction, Increased edema, Decreased strength, Decreased mobility  Visit Diagnosis: Hemiplegia and hemiparesis following cerebral infarction affecting left non-dominant side (HCC)  Muscle weakness (generalized)  Other abnormalities of gait and mobility  Unsteadiness on feet  Stiffness of left ankle, not elsewhere classified     Problem List Patient Active Problem List   Diagnosis Date Noted   Acute ischemic cerebrovascular accident (CVA) involving anterior cerebral artery territory ([place]) 09/03/2020   Acute CVA (cerebrovascular accident) ([place]) 09/01/2020   Acute ischemic stroke ([place]) 08/31/2020   Aneurysm of anterior communicating artery 08/25/2020   Aneurysm, cerebral, nonruptured 08/25/2020   Microcytic anemia 10/26/2014   Vitamin D deficiency 10/17/2014   Stenosing tenosynovitis of thumb 10/17/2014   Internal hemorrhoids 10/17/2014   History of colon polyps 10/17/2014   Osteoarthritis of carpometacarpal joints of both thumbs 04/04/2014   Pre-diabetes 08/18/2013   History of TIA (transient ischemic attack) 08/12/2013   Routine health maintenance 08/15/2010   Hyperlipidemia 08/05/2008   Obesity 07/30/2007   TOBACCO DEPENDENCE 04/19/2006    Sumner Boast, PT 12/16/2020, 3:31 PM  [address]. [place], [place], [address] Phone: [phone]   Fax:  [phone]  Name: Betty Archer MRN:  [id number] Date of Birth: 1960/03/12

## 2020-12-21 ENCOUNTER — Ambulatory Visit: Payer: 59 | Admitting: Speech Pathology

## 2020-12-21 ENCOUNTER — Ambulatory Visit: Payer: 59 | Attending: Physical Medicine and Rehabilitation | Admitting: Physical Therapy

## 2020-12-21 ENCOUNTER — Other Ambulatory Visit: Payer: Self-pay

## 2020-12-21 DIAGNOSIS — M6281 Muscle weakness (generalized): Secondary | ICD-10-CM | POA: Diagnosis present

## 2020-12-21 DIAGNOSIS — R41841 Cognitive communication deficit: Secondary | ICD-10-CM

## 2020-12-21 DIAGNOSIS — R2681 Unsteadiness on feet: Secondary | ICD-10-CM | POA: Diagnosis present

## 2020-12-21 DIAGNOSIS — R278 Other lack of coordination: Secondary | ICD-10-CM | POA: Diagnosis present

## 2020-12-21 DIAGNOSIS — I69354 Hemiplegia and hemiparesis following cerebral infarction affecting left non-dominant side: Secondary | ICD-10-CM | POA: Diagnosis present

## 2020-12-21 DIAGNOSIS — M25672 Stiffness of left ankle, not elsewhere classified: Secondary | ICD-10-CM | POA: Diagnosis present

## 2020-12-21 DIAGNOSIS — R2689 Other abnormalities of gait and mobility: Secondary | ICD-10-CM | POA: Diagnosis present

## 2020-12-21 DIAGNOSIS — I639 Cerebral infarction, unspecified: Secondary | ICD-10-CM | POA: Insufficient documentation

## 2020-12-21 NOTE — Therapy (Addendum)
[address]. [place], [place], [address] Phone: [phone]   Fax:  [phone]  Speech Language Pathology Treatment  Patient Details  Name: Betty Archer MRN: [id number] Date of Birth: May 21, 1960 Referring Provider (SLP): Reesa Chew [place]   Encounter Date: 12/21/2020   End of Session - 12/21/20 1639     Visit Number 21    Number of Visits 17    Date for SLP Re-Evaluation 01/04/21    Authorization - Visit Number 4    Authorization - Number of Visits 8    SLP Start Time 1761    SLP Stop Time  6073    SLP Time Calculation (min) 40 min    Activity Tolerance Patient tolerated treatment well             Past Medical History:  Diagnosis Date   Hyperlipidemia    Stroke [facility])     Past Surgical History:  Procedure Laterality Date   BREAST BIOPSY      Core biopsy done on April 2003   COLONOSCOPY     IR 3D INDEPENDENT WKST  08/25/2020   IR 3D INDEPENDENT WKST  08/25/2020   IR ANGIO INTRA EXTRACRAN SEL INTERNAL CAROTID BILAT MOD SED  08/25/2020   IR ANGIO VERTEBRAL SEL VERTEBRAL BILAT MOD SED  08/25/2020   IR CT HEAD LTD  08/25/2020   IR INTRA CRAN STENT  08/25/2020   IR TRANSCATH/EMBOLIZ  08/25/2020   IR US GUIDE VASC ACCESS RIGHT  08/25/2020   PARTIAL HYSTERECTOMY   10/22/1999    Still has cervix   RADIOLOGY WITH ANESTHESIA N/A 08/25/2020   Procedure: IR WITH ANESTHESIA EMBOLIZATION;  Surgeon: Pedro Earls, MD;  Location: [place];  Service: Radiology;  Laterality: N/A;   TRIGGER FINGER RELEASE Left 12/25/2013   Procedure: LEFT THUMB TRIGGER RELEASE ;  Surgeon: Leanora Cover, MD;  Location: [place];  Service: Orthopedics;  Laterality: Left;    There were no vitals filed for this visit.   Subjective Assessment - 12/21/20 1639     Subjective "How ya'll been?"    Currently in Pain? No/denies                   ADULT SLP TREATMENT - 12/21/20 1632       General Information    Behavior/Cognition Cooperative;Pleasant mood      Treatment Provided   Treatment provided Cognitive-Linquistic      Cognitive-Linquistic Treatment   Treatment focused on Cognition    Skilled Treatment Pt practiced utilizing active listening strategies ("I don't understand" and "can you repeat?") during structured tasks. Pt required minA in order to ask questions appropriately. SLP provided a visual cue of active listening strategies. SLP and pt practiced organization facilitating attention by creating a grocery list for common recipes that pt will use at an upcoming family event. Pt was instructed to group ingredients by category (dairy: butter and cream cheese). Pt completed task with modA and benefited from verbal cues to complete task.      Assessment / Recommendations / Plan   Plan Continue with current plan of care      Progression Toward Goals   Progression toward goals Progressing toward goals                SLP Short Term Goals - 12/21/20 1641       SLP SHORT TERM GOAL #1   Title Pt will recall 3 attention  strategies to assist in increasing focus with minA.    Time 2    Period Weeks    Status Revised   11/02/20   Target Date 12/21/20      SLP SHORT TERM GOAL #2   Title Pt will recall 3 memory strategies (external and/or internal) to assist in recall of important information with minA.    Time 2    Period Weeks    Status Achieved   11/02/20   Target Date 12/21/20      SLP SHORT TERM GOAL #3   Title Pt will identify problem and solution of given functional scenario with minA.    Time 2    Period Weeks    Status On-going   To cont goal; goal not met   Target Date 12/21/20      SLP SHORT TERM GOAL #4   Title Pt will use visual aid to increase intiation and understanding when presented with a statement/question given <2 verbal cues during structured task across 2 sessions.    Time 2    Period Weeks    Status Achieved    Target Date 12/21/20               SLP Long Term Goals - 12/07/20 1414       SLP LONG TERM GOAL #1   Title Pt will demonstrate use of 3 attention strategies in structured environment to increase likliehood of using in daily living.    Time 4    Period Weeks    Status On-going   not met last period   Target Date 01/04/21      SLP LONG TERM GOAL #2   Title Pt will demonstrate use of memory aids to assist with recall of important information.    Time 4    Period Weeks    Status On-going   not met last period   Target Date 01/04/21      SLP LONG TERM GOAL #3   Title Pt will organize medication into pill box with minA from pt family.    Time 8    Period Weeks    Status On-going   Not met last session   Target Date 01/04/21      SLP LONG TERM GOAL #4   Title Pt will use visual aid to increase intiation and understanding when presented with a statement/question given <2 verbal cues during unstructured task across 2 sessions.    Time 4    Period Weeks    Status New    Target Date 01/04/21              Plan - 12/21/20 1641     Clinical Impression Statement See tx note. Pt continues to make progress in processing information and verbal expression. Cont. with current POC.    Speech Therapy Frequency 2x / week    Duration 4 weeks    Treatment/Interventions Functional tasks;Patient/family education;Cueing hierarchy;Environmental controls;Cognitive reorganization;Compensatory techniques;Internal/external aids;SLP instruction and feedback;Multimodal communcation approach    Potential to Achieve Goals Good    Consulted and Agree with Plan of Care Patient;Family member/caregiver    Family Member [facility] - son             Patient will benefit from skilled therapeutic intervention in order to improve the following deficits and impairments:   Cognitive communication deficit    Problem List Patient Active Problem List   Diagnosis Date Noted   Acute ischemic cerebrovascular accident (CVA) involving  anterior  cerebral artery territory [facility]) 09/03/2020   Acute CVA (cerebrovascular accident) ([place]) 09/01/2020   Acute ischemic stroke ([place]) 08/31/2020   Aneurysm of anterior communicating artery 08/25/2020   Aneurysm, cerebral, nonruptured 08/25/2020   Microcytic anemia 10/26/2014   Vitamin D deficiency 10/17/2014   Stenosing tenosynovitis of thumb 10/17/2014   Internal hemorrhoids 10/17/2014   History of colon polyps 10/17/2014   Osteoarthritis of carpometacarpal joints of both thumbs 04/04/2014   Pre-diabetes 08/18/2013   History of TIA (transient ischemic attack) 08/12/2013   Routine health maintenance 08/15/2010   Hyperlipidemia 08/05/2008   Obesity 07/30/2007   TOBACCO DEPENDENCE 04/19/2006    Rosann Auerbach [place] MS, [place], CBIS  12/21/2020, 4:42 PM  [address]. [place], [place], [address] Phone: [phone]   Fax:  [phone]   Name: Betty Archer MRN: [id number] Date of Birth: 09/05/1960

## 2020-12-21 NOTE — Therapy (Signed)
[address]. [place], [place], [address] Phone: [phone]   Fax:  [phone]  Physical Therapy Treatment and Re-Cert  Patient Details  Name: Betty Archer MRN: [id number] Date of Birth: 06/12/1960 Referring Provider (PT): Bary Leriche, [place]     Encounter Date: 12/21/2020   PT End of Session - 12/21/20 1445     Visit Number 19    Date for PT Re-Evaluation 12/21/20    Authorization Type Self Pay    PT Start Time 1408   late arrival   PT Stop Time 1445    PT Time Calculation (min) 37 min    Equipment Utilized During Treatment Gait belt    Activity Tolerance Patient tolerated treatment well    Behavior During Therapy [facility] for tasks assessed/performed             Past Medical History:  Diagnosis Date   Hyperlipidemia    Stroke [facility])     Past Surgical History:  Procedure Laterality Date   BREAST BIOPSY      Core biopsy done on April 2003   COLONOSCOPY     IR 3D INDEPENDENT WKST  08/25/2020   IR 3D INDEPENDENT WKST  08/25/2020   IR ANGIO INTRA EXTRACRAN SEL INTERNAL CAROTID BILAT MOD SED  08/25/2020   IR ANGIO VERTEBRAL SEL VERTEBRAL BILAT MOD SED  08/25/2020   IR CT HEAD LTD  08/25/2020   IR INTRA CRAN STENT  08/25/2020   IR TRANSCATH/EMBOLIZ  08/25/2020   IR US GUIDE VASC ACCESS RIGHT  08/25/2020   PARTIAL HYSTERECTOMY   10/22/1999    Still has cervix   RADIOLOGY WITH ANESTHESIA N/A 08/25/2020   Procedure: IR WITH ANESTHESIA EMBOLIZATION;  Surgeon: Pedro Earls, MD;  Location: [place];  Service: Radiology;  Laterality: N/A;   TRIGGER FINGER RELEASE Left 12/25/2013   Procedure: LEFT THUMB TRIGGER RELEASE ;  Surgeon: Leanora Cover, MD;  Location: [place];  Service: Orthopedics;  Laterality: Left;    There were no vitals filed for this visit.   Subjective Assessment - 12/21/20 1409     Subjective No issues walking without cane. Using cane for longer distance walking.    Pertinent  History HLD, repair of right ACA aneurysm, left MCA aneurysm (unrepaired)    Limitations Sitting;Lifting;Standing;Walking;Writing;House hold activities    Diagnostic tests 09/01/20 brain MRI: Acute Right ACA territory infarct, Ischemia also in the Left ACA territory    Patient Stated Goals patient unable to verbalize goals for therapy but agreeable to work on strengthening    Currently in Pain? No/denies    Pain Score 0-No pain                [facility] PT Assessment - 12/21/20 0001       Strength   Overall Strength Comments R hip: Flex 5, ABD 4+, ADD 4. L hip: Flex 4+, ABD 4+, ADD 5. R knee: ex 5, flex 4+. L knee: ex 4+, flex 4+. R ankle: DF4+, PF 4+. L ankle: DF4, PF 4+                           [facility] Adult PT Treatment/Exercise - 12/21/20 0001       Knee/Hip Exercises: Standing   Heel Raises Both;10 reps;2 sets    Heel Raises Limitations L/R single leg heel raise in // bars    SLS 2x30 sec  27 sec on L before touch   Rebounder one foot on airex, other foot on 6" step ball throw at rebounder x10 reps each leg; feet together on airex ball throw x10; SLS on airex 2x10 each leg   fatigued with SLS                      PT Short Term Goals - 11/04/20 1551       PT SHORT TERM GOAL #1   Title Patient to be mod I/independent with initial HEP.    Status Achieved               PT Long Term Goals - 12/21/20 1440       PT LONG TERM GOAL #1   Title Patient to be mod I/independent with advanced HEP.    Status Achieved      PT LONG TERM GOAL #2   Title Patient to demonstrate B LE strength >/=4+/5.    Status Achieved      PT LONG TERM GOAL #3   Title Pt will be able to perform L SLS = R SLS    Status Achieved      PT LONG TERM GOAL #4   Title Pt will be able to amb ind for at least 1000' for community amb    Status Achieved      PT LONG TERM GOAL #5   Title Pt will be able to tolerate at least 15 minutes of walking outdoors on unlevel  surfaces to demo improved endurance    Baseline only tolerates 10 min and highly fatigued    Time 3    Period Weeks    Status New    Target Date 01/11/21                   Plan - 12/21/20 1446     Clinical Impression Statement Pt has been meeting [place]. Although pt has met her LTG for >1000' amb outdoors, pt still demos decreased endurance (only able to tolerate ~8 minutes of outdoor walking). Pt reports not feeling comfortable working on her endurance at home without guidance -- would like to continue with therapy to work on this and continued balance. Discussed with pt the importance of starting to become more independent with her exercise activity. Pt would benefit from additional 3 weeks to ready pt for complete PT d/c.    Personal Factors and Comorbidities Age;Comorbidity 3+;Time since onset of injury/illness/exacerbation;Fitness;Transportation    Comorbidities HLD, repair of right ACA aneurysm, left MCA aneurysm (unrepaired)    Examination-Activity Limitations Bathing;Locomotion Level;Transfers;Bend;Carry;Squat;Dressing;Stairs;Stand;Hygiene/Grooming;Lift    Examination-Participation Restrictions Laundry;Shop;Driving;Community Activity;Cleaning;Occupation;Church;Meal Prep    Stability/Clinical Decision Making Evolving/Moderate complexity    Rehab Potential Good    PT Frequency 2x / week    PT Duration 3 weeks    PT Treatment/Interventions ADLs/Self Care Home Management;Canalith Repostioning;Cryotherapy;Electrical Stimulation;DME Instruction;Ultrasound;Moist Heat;Gait training;Stair training;Functional mobility training;Therapeutic activities;Therapeutic exercise;Balance training;Neuromuscular re-education;Manual techniques;Patient/family education;Passive range of motion;Dry needling;Energy conservation;Vestibular;Taping    PT Next Visit Plan balance and endurance    PT Home Exercise Plan Access Code [email or web address]    Consulted and Agree with Plan of Care Patient              Patient will benefit from skilled therapeutic intervention in order to improve the following deficits and impairments:  Abnormal gait, Decreased coordination, Decreased range of motion, Difficulty walking, Increased fascial restricitons, Dizziness, Decreased activity tolerance, Decreased safety awareness, Pain, Decreased balance, Impaired flexibility, Improper body  mechanics, Postural dysfunction, Increased edema, Decreased strength, Decreased mobility  Visit Diagnosis: Hemiplegia and hemiparesis following cerebral infarction affecting left non-dominant side (HCC)  Muscle weakness (generalized)  Other abnormalities of gait and mobility  Unsteadiness on feet     Problem List Patient Active Problem List   Diagnosis Date Noted   Acute ischemic cerebrovascular accident (CVA) involving anterior cerebral artery territory ([place]) 09/03/2020   Acute CVA (cerebrovascular accident) ([place]) 09/01/2020   Acute ischemic stroke ([place]) 08/31/2020   Aneurysm of anterior communicating artery 08/25/2020   Aneurysm, cerebral, nonruptured 08/25/2020   Microcytic anemia 10/26/2014   Vitamin D deficiency 10/17/2014   Stenosing tenosynovitis of thumb 10/17/2014   Internal hemorrhoids 10/17/2014   History of colon polyps 10/17/2014   Osteoarthritis of carpometacarpal joints of both thumbs 04/04/2014   Pre-diabetes 08/18/2013   History of TIA (transient ischemic attack) 08/12/2013   Routine health maintenance 08/15/2010   Hyperlipidemia 08/05/2008   Obesity 07/30/2007   TOBACCO DEPENDENCE 04/19/2006    Kalasia Crafton April Gordy Levan, PT, DPT 12/21/2020, 4:39 PM  [place]. [place], [place], [address] Phone: [phone]   Fax:  [phone]  Name: Betty Archer MRN: [id number] Date of Birth: 03/20/1960

## 2020-12-27 ENCOUNTER — Telehealth: Payer: Self-pay

## 2020-12-28 ENCOUNTER — Encounter: Payer: Self-pay | Admitting: Physical Therapy

## 2020-12-28 ENCOUNTER — Ambulatory Visit: Payer: 59 | Admitting: Physical Therapy

## 2020-12-28 ENCOUNTER — Encounter: Payer: Self-pay | Admitting: Speech Pathology

## 2020-12-28 ENCOUNTER — Other Ambulatory Visit: Payer: Self-pay

## 2020-12-28 ENCOUNTER — Ambulatory Visit: Payer: 59 | Admitting: Speech Pathology

## 2020-12-28 DIAGNOSIS — R41841 Cognitive communication deficit: Secondary | ICD-10-CM

## 2020-12-28 DIAGNOSIS — R2689 Other abnormalities of gait and mobility: Secondary | ICD-10-CM

## 2020-12-28 DIAGNOSIS — I69354 Hemiplegia and hemiparesis following cerebral infarction affecting left non-dominant side: Secondary | ICD-10-CM | POA: Diagnosis not present

## 2020-12-28 DIAGNOSIS — M6281 Muscle weakness (generalized): Secondary | ICD-10-CM

## 2020-12-28 DIAGNOSIS — R2681 Unsteadiness on feet: Secondary | ICD-10-CM

## 2020-12-28 NOTE — Therapy (Signed)
[address]. [place], [place], [address] Phone: [phone]   Fax:  [phone]  Speech Language Pathology Treatment  Patient Details  Name: Betty Archer MRN: [id number] Date of Birth: [birth year] Referring Provider (SLP): Reesa Chew [place]   Encounter Date: 12/28/2020   End of Session - 12/28/20 1411     Visit Number 22    Number of Visits 17    Date for SLP Re-Evaluation 01/04/21    Authorization - Visit Number 5    Authorization - Number of Visits 8    SLP Start Time 1400    SLP Stop Time  2297    SLP Time Calculation (min) 44 min    Activity Tolerance Patient tolerated treatment well             Past Medical History:  Diagnosis Date   Hyperlipidemia    Stroke [facility])     Past Surgical History:  Procedure Laterality Date   BREAST BIOPSY      Core biopsy done on April 2003   COLONOSCOPY     IR 3D INDEPENDENT WKST  08/25/2020   IR 3D INDEPENDENT WKST  08/25/2020   IR ANGIO INTRA EXTRACRAN SEL INTERNAL CAROTID BILAT MOD SED  08/25/2020   IR ANGIO VERTEBRAL SEL VERTEBRAL BILAT MOD SED  08/25/2020   IR CT HEAD LTD  08/25/2020   IR INTRA CRAN STENT  08/25/2020   IR TRANSCATH/EMBOLIZ  08/25/2020   IR US GUIDE VASC ACCESS RIGHT  08/25/2020   PARTIAL HYSTERECTOMY   10/22/1999    Still has cervix   RADIOLOGY WITH ANESTHESIA N/A 08/25/2020   Procedure: IR WITH ANESTHESIA EMBOLIZATION;  Surgeon: Pedro Earls, MD;  Location: [place];  Service: Radiology;  Laterality: N/A;   TRIGGER FINGER RELEASE Left 12/25/2013   Procedure: LEFT THUMB TRIGGER RELEASE ;  Surgeon: Leanora Cover, MD;  Location: [place];  Service: Orthopedics;  Laterality: Left;    There were no vitals filed for this visit.   Subjective Assessment - 12/28/20 1411     Subjective "I still feel very slow."    Currently in Pain? No/denies                   ADULT SLP TREATMENT - 12/28/20 1414       General Information    Behavior/Cognition Cooperative;Pleasant mood      Treatment Provided   Treatment provided Cognitive-Linquistic      Cognitive-Linquistic Treatment   Treatment focused on Cognition    Skilled Treatment SLP facilitated increased safety awareness through problem solving task. Pt was given a problem, asked to identify the problem and then generate a solution. Pt was able to ID the problem and generate a single solution given supA. When asked to generate a second solution, pt required modA indicating difficulty with cognitive flexibility. SLP was able to scaffold task as needed to achieve predetermined answer. Pt completed identifying med errors (moderate) task with minA. She has made progress in error awareness.      Assessment / Recommendations / Plan   Plan Continue with current plan of care      Progression Toward Goals   Progression toward goals Progressing toward goals                SLP Short Term Goals - 12/28/20 1413       SLP SHORT TERM GOAL #1   Title Pt will recall 3 attention strategies  to assist in increasing focus with minA.    Time 2    Period Weeks    Status Achieved   11/02/20   Target Date 12/21/20      SLP SHORT TERM GOAL #2   Title Pt will recall 3 memory strategies (external and/or internal) to assist in recall of important information with minA.    Time 2    Period Weeks    Status Achieved   11/02/20   Target Date 12/21/20      SLP SHORT TERM GOAL #3   Title Pt will identify problem and solution of given functional scenario with minA.    Time 2    Period Weeks    Status Achieved   To cont goal; goal not met   Target Date 12/21/20      SLP SHORT TERM GOAL #4   Title Pt will use visual aid to increase intiation and understanding when presented with a statement/question given <2 verbal cues during structured task across 2 sessions.    Time 2    Period Weeks    Status Achieved    Target Date 12/21/20              SLP Long Term Goals -  12/07/20 1414       SLP LONG TERM GOAL #1   Title Pt will demonstrate use of 3 attention strategies in structured environment to increase likliehood of using in daily living.    Time 4    Period Weeks    Status On-going   not met last period   Target Date 01/04/21      SLP LONG TERM GOAL #2   Title Pt will demonstrate use of memory aids to assist with recall of important information.    Time 4    Period Weeks    Status On-going   not met last period   Target Date 01/04/21      SLP LONG TERM GOAL #3   Title Pt will organize medication into pill box with minA from pt family.    Time 8    Period Weeks    Status On-going   Not met last session   Target Date 01/04/21      SLP LONG TERM GOAL #4   Title Pt will use visual aid to increase intiation and understanding when presented with a statement/question given <2 verbal cues during unstructured task across 2 sessions.    Time 4    Period Weeks    Status New    Target Date 01/04/21              Plan - 12/28/20 1411     Clinical Impression Statement See tx note. Pt continues to make progress in processing information and verbal expression. Cont. with current POC.    Speech Therapy Frequency 2x / week    Duration 4 weeks    Treatment/Interventions Functional tasks;Patient/family education;Cueing hierarchy;Environmental controls;Cognitive reorganization;Compensatory techniques;Internal/external aids;SLP instruction and feedback;Multimodal communcation approach    Potential to Achieve Goals Good    Consulted and Agree with Plan of Care Patient;Family member/caregiver    Family Member [facility] - son             Patient will benefit from skilled therapeutic intervention in order to improve the following deficits and impairments:   Cognitive communication deficit    Problem List Patient Active Problem List   Diagnosis Date Noted   Acute ischemic cerebrovascular accident (CVA) involving anterior cerebral artery  territory [facility] [address]) 09/03/2020   Acute CVA (cerebrovascular accident) ([place]) 09/01/2020   Acute ischemic stroke ([place]) 08/31/2020   Aneurysm of anterior communicating artery 08/25/2020   Aneurysm, cerebral, nonruptured 08/25/2020   Microcytic anemia 10/26/2014   Vitamin D deficiency 10/17/2014   Stenosing tenosynovitis of thumb 10/17/2014   Internal hemorrhoids 10/17/2014   History of colon polyps 10/17/2014   Osteoarthritis of carpometacarpal joints of both thumbs 04/04/2014   Pre-diabetes 08/18/2013   History of TIA (transient ischemic attack) 08/12/2013   Routine health maintenance 08/15/2010   Hyperlipidemia 08/05/2008   Obesity 07/30/2007   TOBACCO DEPENDENCE 04/19/2006    Rosann Auerbach [place] MS, [place], CBIS  12/28/2020, 2:23 PM  [address]. [place], [place], [address] Phone: [phone]   Fax:  [phone]   Name: Betty Archer MRN: [id number] Date of Birth: 07-23-1960

## 2020-12-28 NOTE — Therapy (Signed)
[place]. [place], [place], [address] Phone: [phone]   Fax:  [phone]  Physical Therapy Treatment  Patient Details  Name: Betty Archer MRN: [id number] Date of Birth: Feb 09, 1961 Referring Provider (PT): Bary Leriche, [place]   Encounter Date: 12/28/2020   PT End of Session - 12/28/20 1357     Visit Number 20    Authorization Type Self Pay    PT Start Time 2202    PT Stop Time 5427    PT Time Calculation (min) 40 min    Activity Tolerance Patient tolerated treatment well    Behavior During Therapy [facility] for tasks assessed/performed;Flat affect             Past Medical History:  Diagnosis Date   Hyperlipidemia    Stroke [name])     Past Surgical History:  Procedure Laterality Date   BREAST BIOPSY      Core biopsy done on April 2003   COLONOSCOPY     IR 3D INDEPENDENT WKST  08/25/2020   IR 3D INDEPENDENT WKST  08/25/2020   IR ANGIO INTRA EXTRACRAN SEL INTERNAL CAROTID BILAT MOD SED  08/25/2020   IR ANGIO VERTEBRAL SEL VERTEBRAL BILAT MOD SED  08/25/2020   IR CT HEAD LTD  08/25/2020   IR INTRA CRAN STENT  08/25/2020   IR TRANSCATH/EMBOLIZ  08/25/2020   IR US GUIDE VASC ACCESS RIGHT  08/25/2020   PARTIAL HYSTERECTOMY   10/22/1999    Still has cervix   RADIOLOGY WITH ANESTHESIA N/A 08/25/2020   Procedure: IR WITH ANESTHESIA EMBOLIZATION;  Surgeon: Pedro Earls, MD;  Location: [place];  Service: Radiology;  Laterality: N/A;   TRIGGER FINGER RELEASE Left 12/25/2013   Procedure: LEFT THUMB TRIGGER RELEASE ;  Surgeon: Leanora Cover, MD;  Location: [place];  Service: Orthopedics;  Laterality: Left;    There were no vitals filed for this visit.   Subjective Assessment - 12/28/20 1315     Subjective Doing well, still wants to focus on building endurance and balance; I have been walking every other day but not doing much else    Patient is accompained by: Family member    Pertinent History  HLD, repair of right ACA aneurysm, left MCA aneurysm (unrepaired)    Limitations Sitting;Lifting;Standing;Walking;Writing;House hold activities    Diagnostic tests 09/01/20 brain MRI: Acute Right ACA territory infarct, Ischemia also in the Left ACA territory    Patient Stated Goals patient unable to verbalize goals for therapy but agreeable to work on strengthening    Currently in Pain? No/denies                               [facility] Adult PT Treatment/Exercise - 12/28/20 0001       Ambulation/Gait   Gait Comments hill power walking intervals in parking lot; best time 38 seconds to power walk up hill on L side of parking lot behind clinic (4 rounds to fatigue)                 Balance Exercises - 12/28/20 0001       Balance Exercises: Standing   Standing Eyes Closed Narrow base of support (BOS);Head turns;Foam/compliant surface;2 reps   60 seconds x2   Tandem Stance Eyes open;Foam/compliant surface;3 reps;20 secs    SLS Eyes open;Solid surface   with head turns   Tandem Gait Forward;Retro;Foam/compliant surface;4  reps   in // bars   Sidestepping Foam/compliant support;4 reps   in // bars on foam plank   Other Standing Exercises sts in tandem 1x5 B    Other Standing Exercises Comments cone taps on foam starting with single tap progressing to 3 cone taps without placing foot down, progresisng to multiple cone taps on different levels (all on blue foam pad)                PT Education - 12/28/20 1357     Education Details exercise form, walking program progression and hill interval sets    Person(s) Educated Patient    Methods Explanation    Comprehension Verbalized understanding              PT Short Term Goals - 11/04/20 1551       PT SHORT TERM GOAL #1   Title Patient to be mod I/independent with initial HEP.    Status Achieved               PT Long Term Goals - 12/21/20 1440       PT LONG TERM GOAL #1   Title Patient to be  mod I/independent with advanced HEP.    Status Achieved      PT LONG TERM GOAL #2   Title Patient to demonstrate B LE strength >/=4+/5.    Status Achieved      PT LONG TERM GOAL #3   Title Pt will be able to perform L SLS = R SLS    Status Achieved      PT LONG TERM GOAL #4   Title Pt will be able to amb ind for at least 1000' for community amb    Status Achieved      PT LONG TERM GOAL #5   Title Pt will be able to tolerate at least 15 minutes of walking outdoors on unlevel surfaces to demo improved endurance    Baseline only tolerates 10 min and highly fatigued    Time 3    Period Weeks    Status New    Target Date 01/11/21                   Plan - 12/28/20 1358     Clinical Impression Statement Ms. Blough arrives today reporting she would like to continue to focus on her balance and endurance; has not been doing much at home besides 15 minutes of walking every other day. Worked on hill intervals for endurance training, also encouraged this at home alternating with 15 minute walk days to help build endurance. Otherwise focused on advanced balance training today. Did very well overall, continue POC as planned.    Personal Factors and Comorbidities Age;Comorbidity 3+;Time since onset of injury/illness/exacerbation;Fitness;Transportation    Comorbidities HLD, repair of right ACA aneurysm, left MCA aneurysm (unrepaired)    Examination-Activity Limitations Bathing;Locomotion Level;Transfers;Bend;Carry;Squat;Dressing;Stairs;Stand;Hygiene/Grooming;Lift    Examination-Participation Restrictions Laundry;Shop;Driving;Community Activity;Cleaning;Occupation;Church;Meal Prep    Stability/Clinical Decision Making Evolving/Moderate complexity    Clinical Decision Making Low    Rehab Potential Good    PT Frequency 2x / week    PT Duration 3 weeks    PT Treatment/Interventions ADLs/Self Care Home Management;Canalith Repostioning;Cryotherapy;Electrical Stimulation;DME  Instruction;Ultrasound;Moist Heat;Gait training;Stair training;Functional mobility training;Therapeutic activities;Therapeutic exercise;Balance training;Neuromuscular re-education;Manual techniques;Patient/family education;Passive range of motion;Dry needling;Energy conservation;Vestibular;Taping    PT Next Visit Plan balance and endurance; continue hill power walking intervals    PT Home Exercise Plan Access Code [email or web address]    [email or web address]  and Agree with Plan of Care Patient             Patient will benefit from skilled therapeutic intervention in order to improve the following deficits and impairments:  Abnormal gait, Decreased coordination, Decreased range of motion, Difficulty walking, Increased fascial restricitons, Dizziness, Decreased activity tolerance, Decreased safety awareness, Pain, Decreased balance, Impaired flexibility, Improper body mechanics, Postural dysfunction, Increased edema, Decreased strength, Decreased mobility  Visit Diagnosis: Muscle weakness (generalized)  Unsteadiness on feet  Other abnormalities of gait and mobility     Problem List Patient Active Problem List   Diagnosis Date Noted   Acute ischemic cerebrovascular accident (CVA) involving anterior cerebral artery territory ([place]) 09/03/2020   Acute CVA (cerebrovascular accident) ([place]) 09/01/2020   Acute ischemic stroke ([place]) 08/31/2020   Aneurysm of anterior communicating artery 08/25/2020   Aneurysm, cerebral, nonruptured 08/25/2020   Microcytic anemia 10/26/2014   Vitamin D deficiency 10/17/2014   Stenosing tenosynovitis of thumb 10/17/2014   Internal hemorrhoids 10/17/2014   History of colon polyps 10/17/2014   Osteoarthritis of carpometacarpal joints of both thumbs 04/04/2014   Pre-diabetes 08/18/2013   History of TIA (transient ischemic attack) 08/12/2013   Routine health maintenance 08/15/2010   Hyperlipidemia 08/05/2008   Obesity 07/30/2007   TOBACCO DEPENDENCE 04/19/2006   Ann Lions PT,  DPT, PN2   Supplemental Physical Therapist [place]    Pager [phone] Acute Rehab Office [place]. [place], [place], [address] Phone: [phone]   Fax:  [phone]  Name: DIANELYS SCINTO MRN: [id number] Date of Birth: 03-25-1960

## 2020-12-29 ENCOUNTER — Encounter: Payer: Medicaid Other | Attending: Registered Nurse | Admitting: Physical Medicine & Rehabilitation

## 2020-12-29 ENCOUNTER — Encounter: Payer: Self-pay | Admitting: Physical Medicine & Rehabilitation

## 2020-12-29 VITALS — BP 118/81 | HR 104 | Ht 66.0 in | Wt 194.6 lb

## 2020-12-29 DIAGNOSIS — I63529 Cerebral infarction due to unspecified occlusion or stenosis of unspecified anterior cerebral artery: Secondary | ICD-10-CM | POA: Diagnosis not present

## 2020-12-29 MED ORDER — METHYLPHENIDATE HCL 10 MG PO TABS: 10.0000 mg | ORAL_TABLET | Freq: Two times a day (BID) | ORAL | 0 refills | Status: DC

## 2020-12-29 NOTE — Patient Instructions (Signed)
PLEASE FEEL FREE TO CALL OUR OFFICE WITH ANY PROBLEMS OR QUESTIONS ([phone])      

## 2020-12-29 NOTE — Progress Notes (Signed)
Subjective:    Patient ID: Betty Archer, female    DOB: [birth year], 60 y.o.   MRN: [id number]  HPI  Betty Archer is here in follow up of her right ACA and left ACA infarcts. She continues at [facility] for balance, strength. She also sees SLP for problem solving, memory, and cognition. Therapy is once per week.   She complains of pain in her upper left thigh. It bothers her most when she lies down. She hasn't taken anything for the pain except for an occasional tylenol.   She is sleeping, eating well. Bowels are moving as is bladder. Her mood is positive  From an activity standpoing, she's generally sedentary. She does simple chores around the house. Has made a sandwich.   She maintains on ritalin 15mg  bid.    Pain Inventory Average Pain 0 Pain Right Now 0 My pain is  no pain  In the last 24 hours, has pain interfered with the following? General activity 0 Relation with others 0 Enjoyment of life 0 What TIME of day is your pain at its worst? 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    Employer: [employer]  Tobacco Use   Smoking status: Some Days    Packs/day: 0.50    Years: 27.00    Pack years: 13.50    Types: Cigarettes   Smokeless tobacco: Never   Tobacco comments:    11/02/20 one a day  Vaping Use   Vaping Use: Never used  Substance and Sexual Activity   Alcohol use: Yes     Alcohol/week: 0.0 standard drinks    Comment:  patient is a social drinker   Drug use: No   Sexual activity: Not on file  Other Topics Concern   Not on file  Social History Narrative    She has 3 son who are healthy ,    11/02/20 lives with son, Betty Archer    worked as a Research officer, 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   Social Determinants of Health   Financial Resource Strain: Not on file  Food Insecurity: Not on file  Transportation Needs: Not on file  Physical Activity: Not on file  Stress: Not on file  Social Connections: Not on file   Past Surgical History:  Procedure Laterality Date   BREAST BIOPSY      Core biopsy done on April 2003   COLONOSCOPY     IR 3D INDEPENDENT WKST  08/25/2020   IR 3D INDEPENDENT WKST  08/25/2020   IR ANGIO INTRA EXTRACRAN SEL INTERNAL CAROTID BILAT MOD SED  08/25/2020   IR ANGIO VERTEBRAL SEL VERTEBRAL BILAT MOD SED  08/25/2020   IR CT HEAD LTD  08/25/2020   IR INTRA CRAN STENT  08/25/2020  IR TRANSCATH/EMBOLIZ  08/25/2020   IR US GUIDE VASC ACCESS RIGHT  08/25/2020   PARTIAL HYSTERECTOMY   10/22/1999    Still has cervix   RADIOLOGY WITH ANESTHESIA N/A 08/25/2020   Procedure: IR WITH ANESTHESIA EMBOLIZATION;  Surgeon: Pedro Earls, MD;  Location: [place];  Service: Radiology;  Laterality: N/A;   TRIGGER FINGER RELEASE Left 12/25/2013   Procedure: LEFT THUMB TRIGGER RELEASE ;  Surgeon: Leanora Cover, MD;  Location: [place];  Service: Orthopedics;  Laterality: Left;   Past Surgical History:  Procedure Laterality Date   BREAST BIOPSY      Core biopsy done on April 2003   COLONOSCOPY     IR 3D INDEPENDENT WKST  08/25/2020   IR 3D INDEPENDENT WKST  08/25/2020   IR ANGIO INTRA EXTRACRAN SEL INTERNAL CAROTID BILAT MOD SED  08/25/2020   IR ANGIO VERTEBRAL SEL VERTEBRAL BILAT MOD SED  08/25/2020   IR CT HEAD LTD  08/25/2020   IR INTRA CRAN STENT  08/25/2020   IR TRANSCATH/EMBOLIZ  08/25/2020   IR US GUIDE VASC ACCESS RIGHT  08/25/2020   PARTIAL  HYSTERECTOMY   10/22/1999    Still has cervix   RADIOLOGY WITH ANESTHESIA N/A 08/25/2020   Procedure: IR WITH ANESTHESIA EMBOLIZATION;  Surgeon: Pedro Earls, MD;  Location: [place];  Service: Radiology;  Laterality: N/A;   TRIGGER FINGER RELEASE Left 12/25/2013   Procedure: LEFT THUMB TRIGGER RELEASE ;  Surgeon: Leanora Cover, MD;  Location: [place];  Service: Orthopedics;  Laterality: Left;   Past Medical History:  Diagnosis Date   Hyperlipidemia    Stroke ([place])    Ht 5\' 6"  (1.676 m)   Wt 194 lb 9.6 oz (88.3 kg)   BMI 31.41 kg/m   Opioid Risk Score:   Fall Risk Score:  `1  Depression screen PHQ 2/9  Depression screen [facility] 2/9 12/29/2020 12/01/2020 11/02/2020 10/26/2020 10/13/2020 10/16/2014 04/02/2014  Decreased Interest 0 0 0 0 0 1 0  Down, Depressed, Hopeless 0 0 0 0 0 1 0  PHQ - 2 Score 0 0 0 0 0 2 0  Altered sleeping - 0 - 3 0 3 -  Tired, decreased energy - 1 - 3 0 0 -  Change in appetite - 0 - 0 0 0 -  Feeling bad or failure about yourself  - 0 - 0 0 3 -  Trouble concentrating - 2 - 0 0 0 -  Moving slowly or fidgety/restless - 2 - 2 2 0 -  Suicidal thoughts - 0 - 0 0 0 -  PHQ-9 Score - 5 - 8 2 8  -  Difficult doing work/chores - - - Not difficult at all - Not difficult at all -     Review of Systems  Constitutional: Negative.   HENT: Negative.    Eyes: Negative.   Respiratory: Negative.    Cardiovascular: Negative.   Gastrointestinal: Negative.   Endocrine: Negative.   Genitourinary: Negative.   Musculoskeletal: Negative.   Skin: Negative.   Allergic/Immunologic: Negative.   Neurological: Negative.   Hematological: Negative.   Psychiatric/Behavioral: Negative.        Objective:   Physical Exam Gen: no distress, normal appearing HEENT: oral mucosa pink and moist, NCAT Cardio: Reg rate Chest: normal effort, normal rate of breathing Abd: soft, non-distended Ext: no edema Psych: pleasant, normal affect Skin: intact Neuro: oriented to  place, person, date. Fair insight and awareness. Could not recount  any current events. Recalled 0/3 words after 5 minutes. Some delays in processing. No focal cn findings. LUE 4/5. LLE 4+/5. Mild PD, neg romberg.  Musculoskeletal: pain at left greater troch. Pain with cross legged maneuver.        Assessment & Plan:  Medical Problem List and Plan: 1.  Altered mental status with decreased functional mobility/left lower extremity weakness secondary to large right ACA and small left ACA infarct likely related to recent ACOM aneurysm post coiling and stenting             -Patient is followed by Dr. Norma Fredrickson with interventional radiology.  He continues on Brilinta per IR. Surgery is 11/28  -continue with outpt therapies  -no driving 2.  stroke prophylaxis             -antiplatelet therapy: Brilinta 90 mg twice daily, aspirin 81 mg daily 3. Pain Management: greater troch bursitis on left -ice to left hip, avoid sleeping on side -tylenol    4.  Tobacco use.  Tobacco cessation discussed. 5. Attention: will try to reduce ritalin to 10mg  and see how she does   Fifteen minutes of face to face patient care time were spent during this visit. All questions were encouraged and answered.  Follow up with me in 2 mos .

## 2020-12-30 ENCOUNTER — Encounter: Payer: Self-pay | Admitting: Speech Pathology

## 2020-12-30 ENCOUNTER — Ambulatory Visit: Payer: 59 | Admitting: Physical Therapy

## 2020-12-30 ENCOUNTER — Ambulatory Visit: Payer: 59 | Admitting: Speech Pathology

## 2020-12-30 ENCOUNTER — Encounter: Payer: Self-pay | Admitting: Physical Therapy

## 2020-12-30 ENCOUNTER — Other Ambulatory Visit: Payer: Self-pay

## 2020-12-30 DIAGNOSIS — M6281 Muscle weakness (generalized): Secondary | ICD-10-CM

## 2020-12-30 DIAGNOSIS — I69354 Hemiplegia and hemiparesis following cerebral infarction affecting left non-dominant side: Secondary | ICD-10-CM | POA: Diagnosis not present

## 2020-12-30 DIAGNOSIS — I639 Cerebral infarction, unspecified: Secondary | ICD-10-CM

## 2020-12-30 DIAGNOSIS — R41841 Cognitive communication deficit: Secondary | ICD-10-CM

## 2020-12-30 DIAGNOSIS — R278 Other lack of coordination: Secondary | ICD-10-CM

## 2020-12-30 DIAGNOSIS — R2681 Unsteadiness on feet: Secondary | ICD-10-CM

## 2020-12-30 NOTE — Patient Instructions (Signed)
Access Code: [email or web address] URL: [email or web address] Date: 12/30/2020 Prepared by: Ethel Rana  Exercises Sideways Tape Jumps - 1 x daily - 7 x weekly - 3 sets - 10 reps Single Leg Jumps Forward and Backward - 1 x daily - 7 x weekly - 3 sets - 10 reps

## 2020-12-30 NOTE — Therapy (Signed)
[place]. [place], [place], [address] Phone: [phone]   Fax:  [phone]  Speech Language Pathology Treatment  Patient Details  Name: Betty Archer MRN: [id number] Date of Birth: [birth year] Referring Provider (SLP): Reesa Chew [place]   Encounter Date: 12/30/2020   End of Session - 12/30/20 1420     Visit Number 23    Number of Visits 17    Date for SLP Re-Evaluation 01/04/21    Authorization - Visit Number 6    Authorization - Number of Visits 8    SLP Start Time 1400    SLP Stop Time  1440    SLP Time Calculation (min) 40 min    Activity Tolerance Patient tolerated treatment well             Past Medical History:  Diagnosis Date   Hyperlipidemia    Stroke [facility])     Past Surgical History:  Procedure Laterality Date   BREAST BIOPSY      Core biopsy done on April 2003   COLONOSCOPY     IR 3D INDEPENDENT WKST  08/25/2020   IR 3D INDEPENDENT WKST  08/25/2020   IR ANGIO INTRA EXTRACRAN SEL INTERNAL CAROTID BILAT MOD SED  08/25/2020   IR ANGIO VERTEBRAL SEL VERTEBRAL BILAT MOD SED  08/25/2020   IR CT HEAD LTD  08/25/2020   IR INTRA CRAN STENT  08/25/2020   IR TRANSCATH/EMBOLIZ  08/25/2020   IR US GUIDE VASC ACCESS RIGHT  08/25/2020   PARTIAL HYSTERECTOMY   10/22/1999    Still has cervix   RADIOLOGY WITH ANESTHESIA N/A 08/25/2020   Procedure: IR WITH ANESTHESIA EMBOLIZATION;  Surgeon: Pedro Earls, MD;  Location: [place];  Service: Radiology;  Laterality: N/A;   TRIGGER FINGER RELEASE Left 12/25/2013   Procedure: LEFT THUMB TRIGGER RELEASE ;  Surgeon: Leanora Cover, MD;  Location: [place];  Service: Orthopedics;  Laterality: Left;    There were no vitals filed for this visit.   Subjective Assessment - 12/30/20 1419     Subjective "I'm doing okay."    Currently in Pain? No/denies                   ADULT SLP TREATMENT - 12/30/20 1421       General Information    Behavior/Cognition Cooperative;Pleasant mood      Treatment Provided   Treatment provided Cognitive-Linquistic      Cognitive-Linquistic Treatment   Treatment focused on Cognition    Skilled Treatment SLP facilitated increased use of attention and memory strategies during calendar task. Pt required minA to double check for errors. Pt required overall minA to complete calendar task. Pt continues to have decreased awareness of errors and requires someone to bring attention to those errors prior to attempting to correct them.      Assessment / Recommendations / Plan   Plan Continue with current plan of care      Progression Toward Goals   Progression toward goals Progressing toward goals                SLP Short Term Goals - 12/28/20 1413       SLP SHORT TERM GOAL #1   Title Pt will recall 3 attention strategies to assist in increasing focus with minA.    Time 2    Period Weeks    Status Achieved   11/02/20   Target Date 12/21/20  SLP SHORT TERM GOAL #2   Title Pt will recall 3 memory strategies (external and/or internal) to assist in recall of important information with minA.    Time 2    Period Weeks    Status Achieved   11/02/20   Target Date 12/21/20      SLP SHORT TERM GOAL #3   Title Pt will identify problem and solution of given functional scenario with minA.    Time 2    Period Weeks    Status Achieved   To cont goal; goal not met   Target Date 12/21/20      SLP SHORT TERM GOAL #4   Title Pt will use visual aid to increase intiation and understanding when presented with a statement/question given <2 verbal cues during structured task across 2 sessions.    Time 2    Period Weeks    Status Achieved    Target Date 12/21/20              SLP Long Term Goals - 12/07/20 1414       SLP LONG TERM GOAL #1   Title Pt will demonstrate use of 3 attention strategies in structured environment to increase likliehood of using in daily living.    Time 4     Period Weeks    Status On-going   not met last period   Target Date 01/04/21      SLP LONG TERM GOAL #2   Title Pt will demonstrate use of memory aids to assist with recall of important information.    Time 4    Period Weeks    Status On-going   not met last period   Target Date 01/04/21      SLP LONG TERM GOAL #3   Title Pt will organize medication into pill box with minA from pt family.    Time 8    Period Weeks    Status On-going   Not met last session   Target Date 01/04/21      SLP LONG TERM GOAL #4   Title Pt will use visual aid to increase intiation and understanding when presented with a statement/question given <2 verbal cues during unstructured task across 2 sessions.    Time 4    Period Weeks    Status New    Target Date 01/04/21              Plan - 12/30/20 1420     Clinical Impression Statement See tx note. Pt continues to make progress in processing information and verbal expression. To begin discussing discharge. Cont with current POC.    Speech Therapy Frequency 2x / week    Duration 4 weeks    Treatment/Interventions Functional tasks;Patient/family education;Cueing hierarchy;Environmental controls;Cognitive reorganization;Compensatory techniques;Internal/external aids;SLP instruction and feedback;Multimodal communcation approach    Potential to Achieve Goals Good    Consulted and Agree with Plan of Care Patient;Family member/caregiver    Family Member [name] - son             Patient will benefit from skilled therapeutic intervention in order to improve the following deficits and impairments:   Cognitive communication deficit    Problem List Patient Active Problem List   Diagnosis Date Noted   Acute ischemic cerebrovascular accident (CVA) involving anterior cerebral artery territory ([place]) 09/03/2020   Acute CVA (cerebrovascular accident) ([place]) 09/01/2020   Acute ischemic stroke ([place]) 08/31/2020   Aneurysm of anterior communicating  artery 08/25/2020   Aneurysm, cerebral,  nonruptured 08/25/2020   Microcytic anemia 10/26/2014   Vitamin D deficiency 10/17/2014   Stenosing tenosynovitis of thumb 10/17/2014   Internal hemorrhoids 10/17/2014   History of colon polyps 10/17/2014   Osteoarthritis of carpometacarpal joints of both thumbs 04/04/2014   Pre-diabetes 08/18/2013   History of TIA (transient ischemic attack) 08/12/2013   Routine health maintenance 08/15/2010   Hyperlipidemia 08/05/2008   Obesity 07/30/2007   TOBACCO DEPENDENCE 04/19/2006    Rosann Auerbach [place] MS, [place], CBIS  12/30/2020, 2:25 PM  [place]. [place], [place], [address] Phone: [phone]   Fax:  [phone]   Name: Betty [place] MRN: [id number] Date of Birth: 1961/01/07

## 2020-12-30 NOTE — Therapy (Signed)
[place]. [place], [place], [address] Phone: [phone]   Fax:  [phone]  Physical Therapy Treatment  Patient Details  Name: Betty [place] MRN: [id number] Date of Birth: 08-May-1960 Referring Provider (PT): Bary Leriche, [place]   Encounter Date: 12/30/2020   PT End of Session - 12/30/20 1406     Visit Number 21    Number of Visits 24    Date for PT Re-Evaluation 01/11/21    PT Start Time 1316    PT Stop Time 1402    PT Time Calculation (min) 46 min    Activity Tolerance Patient tolerated treatment well             Past Medical History:  Diagnosis Date   Hyperlipidemia    Stroke [facility])     Past Surgical History:  Procedure Laterality Date   BREAST BIOPSY      Core biopsy done on April 2003   COLONOSCOPY     IR 3D INDEPENDENT WKST  08/25/2020   IR 3D INDEPENDENT WKST  08/25/2020   IR ANGIO INTRA EXTRACRAN SEL INTERNAL CAROTID BILAT MOD SED  08/25/2020   IR ANGIO VERTEBRAL SEL VERTEBRAL BILAT MOD SED  08/25/2020   IR CT HEAD LTD  08/25/2020   IR INTRA CRAN STENT  08/25/2020   IR TRANSCATH/EMBOLIZ  08/25/2020   IR US GUIDE VASC ACCESS RIGHT  08/25/2020   PARTIAL HYSTERECTOMY   10/22/1999    Still has cervix   RADIOLOGY WITH ANESTHESIA N/A 08/25/2020   Procedure: IR WITH ANESTHESIA EMBOLIZATION;  Surgeon: Pedro Earls, MD;  Location: [place];  Service: Radiology;  Laterality: N/A;   TRIGGER FINGER RELEASE Left 12/25/2013   Procedure: LEFT THUMB TRIGGER RELEASE ;  Surgeon: Leanora Cover, MD;  Location: [place];  Service: Orthopedics;  Laterality: Left;    There were no vitals filed for this visit.   Subjective Assessment - 12/30/20 1319     Subjective Doing well, still wants to focus on building endurance and balance;    Patient is accompained by: Family member    Pertinent History HLD, repair of right ACA aneurysm, left MCA aneurysm (unrepaired)    Limitations  Sitting;Lifting;Standing;Walking;Writing;House hold activities    Diagnostic tests 09/01/20 brain MRI: Acute Right ACA territory infarct, Ischemia also in the Left ACA territory    Patient Stated Goals patient unable to verbalize goals for therapy but agreeable to work on strengthening    Pain Onset More than a month ago                [facility] PT Assessment - 12/30/20 0001       High Level Balance   High Level Balance Comments Patient performed progressively more challenging activities for balance, motor coordination and speed of movement. She started with side stepping on airex long pads in each direction. She bounced and caught a ball while walking, then pushed a tennis ball with a hockey stick. Moved to floor ladder wher she walked through it. She side stepped, performed step ins an step outs, and shuffled through the ladder. all performed with light CGA, no unsteadiness.                           [facility] Adult PT Treatment/Exercise - 12/30/20 0001       Knee/Hip Exercises: Aerobic   Elliptical 2 x 1:30. Stable, but became SOB and fatigued.  Nustep level 5 x 5 minutes      Knee/Hip Exercises: Standing   Rebounder Patinet performed mini jumps, jump abd/add, jump and switch tandem. BUE support no unsteadiness noted.                     PT Education - 12/30/20 1402     Education Details Updated HEP, encouraged to continue to walk-daily, including up hills.    Person(s) Educated Patient    Methods Explanation;Demonstration;Handout    Comprehension Returned demonstration;Verbalized understanding              PT Short Term Goals - 11/04/20 1551       PT SHORT TERM GOAL #1   Title Patient to be mod I/independent with initial HEP.    Status Achieved               PT Long Term Goals - 12/30/20 1445       PT LONG TERM GOAL #1   Title Patient to be mod I/independent with advanced HEP.    Status Achieved      PT LONG TERM GOAL #2    Title Patient to demonstrate B LE strength >/=4+/5.    Status Achieved      PT LONG TERM GOAL #3   Title Pt will be able to perform L SLS = R SLS    Status Achieved      PT LONG TERM GOAL #4   Title Pt will be able to amb ind for at least 1000' for community amb    Status Achieved      PT LONG TERM GOAL #5   Title Pt will be able to tolerate at least 15 minutes of walking outdoors on unlevel surfaces to demo improved endurance    Baseline only tolerates 10 min and highly fatigued    Time 3    Period Weeks    Status On-going                   Plan - 12/30/20 1443     Clinical Impression Statement Therapist progressed patient through increasingly challenging dynamic activities with changin of direction, distracted attention, single limb stance, jumping. She performed all activities iwth S-CGA, no unsteadiness, but did fatigue and become SOB quickly. Updated HEP to include some more dynamic activities.    Personal Factors and Comorbidities Age;Comorbidity 3+;Time since onset of injury/illness/exacerbation;Fitness;Transportation    Comorbidities HLD, repair of right ACA aneurysm, left MCA aneurysm (unrepaired)    Examination-Activity Limitations Bathing;Locomotion Level;Transfers;Bend;Carry;Squat;Dressing;Stairs;Stand;Hygiene/Grooming;Lift    Examination-Participation Restrictions Laundry;Shop;Driving;Community Activity;Cleaning;Occupation;Church;Meal Prep    Stability/Clinical Decision Making Evolving/Moderate complexity    Rehab Potential Good    PT Frequency 2x / week    PT Duration 3 weeks    PT Treatment/Interventions ADLs/Self Care Home Management;Canalith Repostioning;Cryotherapy;Electrical Stimulation;DME Instruction;Ultrasound;Moist Heat;Gait training;Stair training;Functional mobility training;Therapeutic activities;Therapeutic exercise;Balance training;Neuromuscular re-education;Manual techniques;Patient/family education;Passive range of motion;Dry needling;Energy  conservation;Vestibular;Taping    PT Next Visit Plan balance and endurance; continue hill power walking intervals    PT Home Exercise Plan Access Code [email or web address]    Consulted and Agree with Plan of Care Patient             Patient will benefit from skilled therapeutic intervention in order to improve the following deficits and impairments:  Abnormal gait, Decreased coordination, Decreased range of motion, Difficulty walking, Increased fascial restricitons, Dizziness, Decreased activity tolerance, Decreased safety awareness, Pain, Decreased balance, Impaired flexibility, Improper body mechanics, Postural dysfunction, Increased edema, Decreased  strength, Decreased mobility  Visit Diagnosis: Muscle weakness (generalized)  Other lack of coordination  Unsteadiness on feet  Acute CVA (cerebrovascular accident) ([place])  Hemiplegia and hemiparesis following cerebral infarction affecting left non-dominant side [facility])     Problem List Patient Active Problem List   Diagnosis Date Noted   Acute ischemic cerebrovascular accident (CVA) involving anterior cerebral artery territory ([place]) 09/03/2020   Acute CVA (cerebrovascular accident) ([place]) 09/01/2020   Acute ischemic stroke ([place]) 08/31/2020   Aneurysm of anterior communicating artery 08/25/2020   Aneurysm, cerebral, nonruptured 08/25/2020   Microcytic anemia 10/26/2014   Vitamin D deficiency 10/17/2014   Stenosing tenosynovitis of thumb 10/17/2014   Internal hemorrhoids 10/17/2014   History of colon polyps 10/17/2014   Osteoarthritis of carpometacarpal joints of both thumbs 04/04/2014   Pre-diabetes 08/18/2013   History of TIA (transient ischemic attack) 08/12/2013   Routine health maintenance 08/15/2010   Hyperlipidemia 08/05/2008   Obesity 07/30/2007   TOBACCO DEPENDENCE 04/19/2006    Marcelina Morel, DPT 12/30/2020, 2:47 PM  [place]. [place], [place],  [address] Phone: [phone]   Fax:  [phone]  Name: Betty Archer MRN: [id number] Date of Birth: Apr 22, 1960

## 2020-12-31 ENCOUNTER — Other Ambulatory Visit: Payer: Self-pay

## 2020-12-31 ENCOUNTER — Telehealth: Payer: Self-pay

## 2020-12-31 DIAGNOSIS — E785 Hyperlipidemia, unspecified: Secondary | ICD-10-CM

## 2020-12-31 MED ORDER — ATORVASTATIN CALCIUM 80 MG PO TABS
80.0000 mg | ORAL_TABLET | Freq: Every day | ORAL | 1 refills | Status: DC
  Filled 2020-12-31: qty 30, 30d supply, fill #0
  Filled 2021-01-31: qty 30, 30d supply, fill #1

## 2020-12-31 MED ORDER — TICAGRELOR 90 MG PO TABS
90.0000 mg | ORAL_TABLET | Freq: Two times a day (BID) | ORAL | 0 refills | Status: DC
  Filled 2020-12-31: qty 60, 30d supply, fill #0

## 2020-12-31 MED ORDER — TICAGRELOR 90 MG PO TABS
90.0000 mg | ORAL_TABLET | Freq: Two times a day (BID) | ORAL | 1 refills | Status: DC
  Filled 2020-12-31: qty 60, 30d supply, fill #0
  Filled 2021-01-31: qty 60, 30d supply, fill #1

## 2020-12-31 MED ORDER — ATORVASTATIN CALCIUM 80 MG PO TABS
80.0000 mg | ORAL_TABLET | Freq: Every day | ORAL | 0 refills | Status: DC
  Filled 2020-12-31: qty 30, 30d supply, fill #0

## 2020-12-31 NOTE — Telephone Encounter (Signed)
Pts son called back in today, staying they need a refill on her brilinta and atorvastatin. I advised the pt that we have not been filling these Rxs in the past pt states he contacted her previous provider and they advised him to contact our office for all refills moving forward  Phone number for pts son [phone]

## 2020-12-31 NOTE — Telephone Encounter (Signed)
Med refill  request has been completed

## 2020-12-31 NOTE — Telephone Encounter (Signed)
Pts son called back in stating that she is completely out of both medications, brilinta and atorvastation

## 2020-12-31 NOTE — Telephone Encounter (Signed)
[place] (Son) called:  Betty Archer is almost out of he Brilinta & Lipitor. Will you send in refills until she can get in to see the PCP. If so please send to [place]. ( He has tried to reach the PCP with no success).  Call back phone [phone]. Thank you.

## 2020-12-31 NOTE — Telephone Encounter (Signed)
All set. Thanks!

## 2021-01-03 NOTE — Telephone Encounter (Signed)
Son aware of Rx being sent by Dr. Naaman Plummer.

## 2021-01-04 ENCOUNTER — Encounter: Payer: Self-pay | Admitting: Speech Pathology

## 2021-01-04 ENCOUNTER — Ambulatory Visit: Payer: 59 | Admitting: Speech Pathology

## 2021-01-04 ENCOUNTER — Encounter: Payer: Self-pay | Admitting: Physical Therapy

## 2021-01-04 ENCOUNTER — Ambulatory Visit: Payer: 59 | Admitting: Physical Therapy

## 2021-01-04 ENCOUNTER — Other Ambulatory Visit: Payer: Self-pay

## 2021-01-04 DIAGNOSIS — I69354 Hemiplegia and hemiparesis following cerebral infarction affecting left non-dominant side: Secondary | ICD-10-CM | POA: Diagnosis not present

## 2021-01-04 DIAGNOSIS — R41841 Cognitive communication deficit: Secondary | ICD-10-CM

## 2021-01-04 DIAGNOSIS — R2681 Unsteadiness on feet: Secondary | ICD-10-CM

## 2021-01-04 DIAGNOSIS — R278 Other lack of coordination: Secondary | ICD-10-CM

## 2021-01-04 DIAGNOSIS — M6281 Muscle weakness (generalized): Secondary | ICD-10-CM

## 2021-01-04 DIAGNOSIS — M25672 Stiffness of left ankle, not elsewhere classified: Secondary | ICD-10-CM

## 2021-01-04 DIAGNOSIS — I639 Cerebral infarction, unspecified: Secondary | ICD-10-CM

## 2021-01-04 DIAGNOSIS — R2689 Other abnormalities of gait and mobility: Secondary | ICD-10-CM

## 2021-01-04 NOTE — Therapy (Signed)
[place]. [place], [place], [address] Phone: [phone]   Fax:  [phone]  Physical Therapy Treatment  Patient Details  Name: Betty Archer MRN: [id number] Date of Birth: Dec 10, 1960 Referring Provider (PT): Bary Leriche, [place]   Encounter Date: 01/04/2021   PT End of Session - 01/04/21 1409     Visit Number 22    Number of Visits 24    Date for PT Re-Evaluation 01/11/21    Authorization Type Self Pay    PT Start Time 1314    PT Stop Time 6606    PT Time Calculation (min) 44 min    Equipment Utilized During Treatment Gait belt    Activity Tolerance Patient tolerated treatment well    Behavior During Therapy [facility] for tasks assessed/performed;Flat affect             Past Medical History:  Diagnosis Date   Hyperlipidemia    Stroke [facility])     Past Surgical History:  Procedure Laterality Date   BREAST BIOPSY      Core biopsy done on April 2003   COLONOSCOPY     IR 3D INDEPENDENT WKST  08/25/2020   IR 3D INDEPENDENT WKST  08/25/2020   IR ANGIO INTRA EXTRACRAN SEL INTERNAL CAROTID BILAT MOD SED  08/25/2020   IR ANGIO VERTEBRAL SEL VERTEBRAL BILAT MOD SED  08/25/2020   IR CT HEAD LTD  08/25/2020   IR INTRA CRAN STENT  08/25/2020   IR TRANSCATH/EMBOLIZ  08/25/2020   IR US GUIDE VASC ACCESS RIGHT  08/25/2020   PARTIAL HYSTERECTOMY   10/22/1999    Still has cervix   RADIOLOGY WITH ANESTHESIA N/A 08/25/2020   Procedure: IR WITH ANESTHESIA EMBOLIZATION;  Surgeon: Pedro Earls, MD;  Location: [place];  Service: Radiology;  Laterality: N/A;   TRIGGER FINGER RELEASE Left 12/25/2013   Procedure: LEFT THUMB TRIGGER RELEASE ;  Surgeon: Leanora Cover, MD;  Location: [place];  Service: Orthopedics;  Laterality: Left;    There were no vitals filed for this visit.   Subjective Assessment - 01/04/21 1356     Subjective No complaints. She reports she has been performing HEP. It is challenging, but  not too difficult.    Currently in Pain? No/denies                               [facility] Adult PT Treatment/Exercise - 01/04/21 0001       Knee/Hip Exercises: Aerobic   Tread Mill 1.5 base speed. 10 minutes total including 5 x 30 seconds at 4% grade and 3 x 30 seconds at 2.0 speed. CGA    Nustep level 5 x 5 minutes      Knee/Hip Exercises: Plyometrics   Bilateral Jumping Other (comment)   Jumped over 2" object x 2, but then became fearful and could not continue with jumping.     Knee/Hip Exercises: Standing   Forward Step Up Both;1 set;5 reps;Hand Hold: 1;Other (comment)   Air Ex pad placed on top of 6" step, step up into runners stride. Fatigued after 7 reps.   SLS 2x30 sec   on Air ex pad   SLS with Vectors Swinging opposite leg forward and back, occasional UE support on parallel bars, 2 x 10 seconds B.    Other Standing Knee Exercises Patient performed B and then U dead lifts iwth 5 # weight  and minimal UE support on rail, x 10 reps each, CGA and mod VC/TC for correct technique.                     PT Education - 01/04/21 1406     Education Details POC to continue to build power and control in LLE.    Person(s) Educated Patient    Methods Explanation    Comprehension Verbalized understanding              PT Short Term Goals - 11/04/20 1551       PT SHORT TERM GOAL #1   Title Patient to be mod I/independent with initial HEP.    Status Achieved               PT Long Term Goals - 01/04/21 1430       PT LONG TERM GOAL #5   Baseline 10 minutes    Time 2    Period Weeks    Status On-going    Target Date 01/11/21                   Plan - 01/04/21 1409     Clinical Impression Statement Patient continues to excel at increasingly difficult challenges involving SLS, power, and coordinated control of LLE. She tolerated 10 minutes of treadmill activites including 30 second episodes of incline walking or faster walking.     Personal Factors and Comorbidities Age;Comorbidity 3+;Time since onset of injury/illness/exacerbation;Fitness;Transportation    Comorbidities HLD, repair of right ACA aneurysm, left MCA aneurysm (unrepaired)    Examination-Activity Limitations Bathing;Locomotion Level;Transfers;Bend;Carry;Squat;Dressing;Stairs;Stand;Hygiene/Grooming;Lift    Examination-Participation Restrictions Laundry;Shop;Driving;Community Activity;Cleaning;Occupation;Church;Meal Prep    Stability/Clinical Decision Making Evolving/Moderate complexity    Clinical Decision Making Low    Rehab Potential Good    PT Frequency 2x / week    PT Duration 2 weeks    PT Treatment/Interventions ADLs/Self Care Home Management;Canalith Repostioning;Cryotherapy;Electrical Stimulation;DME Instruction;Ultrasound;Moist Heat;Gait training;Stair training;Functional mobility training;Therapeutic activities;Therapeutic exercise;Balance training;Neuromuscular re-education;Manual techniques;Patient/family education;Passive range of motion;Dry needling;Energy conservation;Vestibular;Taping    PT Next Visit Plan Continue strength, power , and coordination activities, power walking.    PT Home Exercise Plan Access Code [email or web address]    Consulted and Agree with Plan of Care Patient             Patient will benefit from skilled therapeutic intervention in order to improve the following deficits and impairments:  Abnormal gait, Decreased coordination, Decreased range of motion, Difficulty walking, Increased fascial restricitons, Dizziness, Decreased activity tolerance, Decreased safety awareness, Pain, Decreased balance, Impaired flexibility, Improper body mechanics, Postural dysfunction, Increased edema, Decreased strength, Decreased mobility  Visit Diagnosis: Stiffness of left ankle, not elsewhere classified  Muscle weakness (generalized)  Other lack of coordination  Unsteadiness on feet  Acute CVA (cerebrovascular accident) ([place])  Hemiplegia and  hemiparesis following cerebral infarction affecting left non-dominant side ([facility])  Other abnormalities of gait and mobility     Problem List Patient Active Problem List   Diagnosis Date Noted   Acute ischemic cerebrovascular accident (CVA) involving anterior cerebral artery territory ([place]) 09/03/2020   Acute CVA (cerebrovascular accident) ([place]) 09/01/2020   Acute ischemic stroke ([place]) 08/31/2020   Aneurysm of anterior communicating artery 08/25/2020   Aneurysm, cerebral, nonruptured 08/25/2020   Microcytic anemia 10/26/2014   Vitamin D deficiency 10/17/2014   Stenosing tenosynovitis of thumb 10/17/2014   Internal hemorrhoids 10/17/2014   History of colon polyps 10/17/2014   Osteoarthritis of carpometacarpal joints of both thumbs 04/04/2014  Pre-diabetes 08/18/2013   History of TIA (transient ischemic attack) 08/12/2013   Routine health maintenance 08/15/2010   Hyperlipidemia 08/05/2008   Obesity 07/30/2007   TOBACCO DEPENDENCE 04/19/2006    Marcelina Morel, DPT 01/04/2021, 2:32 PM  [place]. [place], [place], [address] Phone: [phone]   Fax:  [phone]  Name: Betty [place] MRN: [id number] Date of Birth: 10/17/1960

## 2021-01-04 NOTE — Therapy (Signed)
[place]. [place], [place], [address] Phone: [phone]   Fax:  [phone]  Speech Language Pathology Treatment  Patient Details  Name: CORNELLA EMMER MRN: [id number] Date of Birth: Dec 02, 1960 Referring Provider (SLP): Reesa Chew [place]   Encounter Date: 01/04/2021   End of Session - 01/04/21 1714     Visit Number 24    Number of Visits 17    Date for SLP Re-Evaluation 01/04/21    Authorization - Visit Number 7    Authorization - Number of Visits 8    SLP Start Time 1400    SLP Stop Time  1440    SLP Time Calculation (min) 40 min    Activity Tolerance Patient tolerated treatment well             Past Medical History:  Diagnosis Date   Hyperlipidemia    Stroke [facility])     Past Surgical History:  Procedure Laterality Date   BREAST BIOPSY      Core biopsy done on April 2003   COLONOSCOPY     IR 3D INDEPENDENT WKST  08/25/2020   IR 3D INDEPENDENT WKST  08/25/2020   IR ANGIO INTRA EXTRACRAN SEL INTERNAL CAROTID BILAT MOD SED  08/25/2020   IR ANGIO VERTEBRAL SEL VERTEBRAL BILAT MOD SED  08/25/2020   IR CT HEAD LTD  08/25/2020   IR INTRA CRAN STENT  08/25/2020   IR TRANSCATH/EMBOLIZ  08/25/2020   IR US GUIDE VASC ACCESS RIGHT  08/25/2020   PARTIAL HYSTERECTOMY   10/22/1999    Still has cervix   RADIOLOGY WITH ANESTHESIA N/A 08/25/2020   Procedure: IR WITH ANESTHESIA EMBOLIZATION;  Surgeon: Pedro Earls, MD;  Location: [place];  Service: Radiology;  Laterality: N/A;   TRIGGER FINGER RELEASE Left 12/25/2013   Procedure: LEFT THUMB TRIGGER RELEASE ;  Surgeon: Leanora Cover, MD;  Location: [facility];  Service: Orthopedics;  Laterality: Left;    There were no vitals filed for this visit.   Subjective Assessment - 01/04/21 1405     Subjective "I brought this for you [facility]."    Currently in Pain? No/denies                   ADULT SLP TREATMENT - 01/04/21 1425       General  Information   Behavior/Cognition Cooperative;Pleasant mood      Treatment Provided   Treatment provided Cognitive-Linquistic      Cognitive-Linquistic Treatment   Treatment focused on Cognition    Skilled Treatment SLP facilitated increased use of attention and thought organization during hair salon task and cooking task. Pt required minA to double check for errors. Pt required overall minA to complete thought organization tasks. Pt continues to have decreased awareness of errors, but showed noted improvement today.      Assessment / Recommendations / Plan   Plan Continue with current plan of care      Progression Toward Goals   Progression toward goals Progressing toward goals                SLP Short Term Goals - 12/28/20 1413       SLP SHORT TERM GOAL #1   Title Pt will recall 3 attention strategies to assist in increasing focus with minA.    Time 2    Period Weeks    Status Achieved   11/02/20   Target Date 12/21/20  SLP SHORT TERM GOAL #2   Title Pt will recall 3 memory strategies (external and/or internal) to assist in recall of important information with minA.    Time 2    Period Weeks    Status Achieved   11/02/20   Target Date 12/21/20      SLP SHORT TERM GOAL #3   Title Pt will identify problem and solution of given functional scenario with minA.    Time 2    Period Weeks    Status Achieved   To cont goal; goal not met   Target Date 12/21/20      SLP SHORT TERM GOAL #4   Title Pt will use visual aid to increase intiation and understanding when presented with a statement/question given <2 verbal cues during structured task across 2 sessions.    Time 2    Period Weeks    Status Achieved    Target Date 12/21/20              SLP Long Term Goals - 01/04/21 1717       SLP LONG TERM GOAL #1   Title Pt will demonstrate use of 3 attention strategies in structured environment to increase likliehood of using in daily living.    Time 4    Period  Weeks    Status Achieved   not met last period     SLP LONG TERM GOAL #2   Title Pt will demonstrate use of memory aids to assist with recall of important information.    Time 4    Period Weeks    Status Achieved   not met last period     SLP LONG TERM GOAL #3   Title Pt will organize medication into pill box with minA from pt family.    Time 8    Period Weeks    Status Not Met   Not met last session     SLP LONG TERM GOAL #4   Title Pt will use visual aid to increase intiation and understanding when presented with a statement/question given <2 verbal cues during unstructured task across 2 sessions.    Time 4    Period Weeks    Status Achieved              Plan - 01/04/21 1716     Clinical Impression Statement See tx note. Pt continues to make progress in processing information and verbal expression. Cont with current POC.    Speech Therapy Frequency 2x / week    Duration 4 weeks    Treatment/Interventions Functional tasks;Patient/family education;Cueing hierarchy;Environmental controls;Cognitive reorganization;Compensatory techniques;Internal/external aids;SLP instruction and feedback;Multimodal communcation approach    Potential to Achieve Goals Good    Consulted and Agree with Plan of Care Patient;Family member/caregiver    Family Member [facility] - son             Patient will benefit from skilled therapeutic intervention in order to improve the following deficits and impairments:   Cognitive communication deficit    Problem List Patient Active Problem List   Diagnosis Date Noted   Acute ischemic cerebrovascular accident (CVA) involving anterior cerebral artery territory ([place]) 09/03/2020   Acute CVA (cerebrovascular accident) ([place]) 09/01/2020   Acute ischemic stroke ([place]) 08/31/2020   Aneurysm of anterior communicating artery 08/25/2020   Aneurysm, cerebral, nonruptured 08/25/2020   Microcytic anemia 10/26/2014   Vitamin D deficiency 10/17/2014    Stenosing tenosynovitis of thumb 10/17/2014   Internal hemorrhoids 10/17/2014  History of colon polyps 10/17/2014   Osteoarthritis of carpometacarpal joints of both thumbs 04/04/2014   Pre-diabetes 08/18/2013   History of TIA (transient ischemic attack) 08/12/2013   Routine health maintenance 08/15/2010   Hyperlipidemia 08/05/2008   Obesity 07/30/2007   TOBACCO DEPENDENCE 04/19/2006    Rosann Auerbach [place] MS, [place], CBIS  01/04/2021, 5:19 PM  [place]. [place], [place], [address] Phone: [phone]   Fax:  [phone]   Name: JAICE LAGUE MRN: [id number] Date of Birth: 22-Dec-1960

## 2021-01-06 ENCOUNTER — Other Ambulatory Visit: Payer: Self-pay

## 2021-01-06 ENCOUNTER — Ambulatory Visit: Payer: 59 | Admitting: Physical Therapy

## 2021-01-06 ENCOUNTER — Encounter: Payer: Self-pay | Admitting: Physical Therapy

## 2021-01-06 ENCOUNTER — Ambulatory Visit: Payer: 59 | Admitting: Speech Pathology

## 2021-01-06 DIAGNOSIS — I69354 Hemiplegia and hemiparesis following cerebral infarction affecting left non-dominant side: Secondary | ICD-10-CM

## 2021-01-06 DIAGNOSIS — R2681 Unsteadiness on feet: Secondary | ICD-10-CM

## 2021-01-06 DIAGNOSIS — R41841 Cognitive communication deficit: Secondary | ICD-10-CM

## 2021-01-06 DIAGNOSIS — I639 Cerebral infarction, unspecified: Secondary | ICD-10-CM

## 2021-01-06 DIAGNOSIS — R2689 Other abnormalities of gait and mobility: Secondary | ICD-10-CM

## 2021-01-06 DIAGNOSIS — R278 Other lack of coordination: Secondary | ICD-10-CM

## 2021-01-06 DIAGNOSIS — M6281 Muscle weakness (generalized): Secondary | ICD-10-CM

## 2021-01-06 NOTE — Patient Instructions (Signed)
Access Code: [email or web address] URL: [email or web address] Date: 01/06/2021 Prepared by: Ethel Rana  Exercises Single Leg Bridge - 1 x daily - 7 x weekly - 3 sets - 10 reps Supine Hip Flexion with Resistance Loop - 1 x daily - 7 x weekly - 3 sets - 10 reps Single Leg Stance - 1 x daily - 7 x weekly - 3 sets - 20 sec hold Forward T with Counter Support - 1 x daily - 7 x weekly - 3 sets - 10 reps Hip Abduction with Resistance Loop - 1 x daily - 7 x weekly - 3 sets - 10 reps Hip Extension with Resistance Loop - 1 x daily - 7 x weekly - 3 sets - 10 reps Side Stepping with Counter Support - 1 x daily - 7 x weekly - 3 sets - 10 reps

## 2021-01-06 NOTE — Therapy (Signed)
[place]. [place], [place], [address] Phone: [phone]   Fax:  [phone]  Physical Therapy Treatment  Patient Details  Name: Betty Archer MRN: [id number] Date of Birth: Feb 25, 1960 Referring Provider (PT): Bary Leriche, [place]   Encounter Date: 01/06/2021   PT End of Session - 01/06/21 1406     Visit Number 23    Number of Visits 24    Date for PT Re-Evaluation 01/11/21    Authorization Type Self Pay    PT Start Time 1313    PT Stop Time 1401    PT Time Calculation (min) 48 min    Activity Tolerance Patient tolerated treatment well    Behavior During Therapy [facility] for tasks assessed/performed;Flat affect             Past Medical History:  Diagnosis Date   Hyperlipidemia    Stroke [facility])     Past Surgical History:  Procedure Laterality Date   BREAST BIOPSY      Core biopsy done on April 2003   COLONOSCOPY     IR 3D INDEPENDENT WKST  08/25/2020   IR 3D INDEPENDENT WKST  08/25/2020   IR ANGIO INTRA EXTRACRAN SEL INTERNAL CAROTID BILAT MOD SED  08/25/2020   IR ANGIO VERTEBRAL SEL VERTEBRAL BILAT MOD SED  08/25/2020   IR CT HEAD LTD  08/25/2020   IR INTRA CRAN STENT  08/25/2020   IR TRANSCATH/EMBOLIZ  08/25/2020   IR US GUIDE VASC ACCESS RIGHT  08/25/2020   PARTIAL HYSTERECTOMY   10/22/1999    Still has cervix   RADIOLOGY WITH ANESTHESIA N/A 08/25/2020   Procedure: IR WITH ANESTHESIA EMBOLIZATION;  Surgeon: Pedro Earls, MD;  Location: [place];  Service: Radiology;  Laterality: N/A;   TRIGGER FINGER RELEASE Left 12/25/2013   Procedure: LEFT THUMB TRIGGER RELEASE ;  Surgeon: Leanora Cover, MD;  Location: [place];  Service: Orthopedics;  Laterality: Left;    There were no vitals filed for this visit.                      [facility] Adult PT Treatment/Exercise - 01/06/21 0001       Knee/Hip Exercises: Aerobic   Tread Mill 1.5 base speed at 1% grade. 10 minutes total  including 3 x 60 seconds at 4% grade and 3 x 30 seconds at 2.2-2.5 speed. CGA      Knee/Hip Exercises: Standing   Hip Abduction Stengthening;Both;1 set;10 reps    Abduction Limitations Red T band    Hip Extension Stengthening;Both;1 set;10 reps    Extension Limitations Red T band    Rebounder Patient performed mini jumps, jump abd/add, jump and switch tandem. BUE support no unsteadiness noted. 10 reps of each. SOB and fatigued after activities.    Other Standing Knee Exercises Patient performed U dead lifts iwth 1 # weight and minimal UE support on rail, x 10 reps each, CGA and mod VC/TC for correct technique.    Other Standing Knee Exercises Quick steps R/L x 2:00 for endurance.BUE support on rail.                     PT Education - 01/06/21 1403     Education Details Updated HEP, also upgraded gait scedule to include repeated hill walks, repeated fast walking intervals.    Person(s) Educated Patient    Methods Explanation;Demonstration    Comprehension Returned demonstration;Verbalized understanding  PT Short Term Goals - 01/06/21 1409       PT SHORT TERM GOAL #1   Title Patient to be mod I/independent with initial HEP.    Status Achieved               PT Long Term Goals - 01/04/21 1430       PT LONG TERM GOAL #5   Baseline 10 minutes    Time 2    Period Weeks    Status On-going    Target Date 01/11/21                   Plan - 01/06/21 1407     Clinical Impression Statement Patient appeared anxious about D/Cing PT services. Therapsit updated HEP to include strength, balance, coordiantion, and endurance activities to allow patient to continue to progress. Patient agreeable to practice HEP and re-assess status at her next PT visit, which is currently her last scheduled appointment.    Personal Factors and Comorbidities Age;Comorbidity 3+;Time since onset of injury/illness/exacerbation;Fitness;Transportation    Comorbidities HLD,  repair of right ACA aneurysm, left MCA aneurysm (unrepaired)    Examination-Activity Limitations Bathing;Locomotion Level;Transfers;Bend;Carry;Squat;Dressing;Stairs;Stand;Hygiene/Grooming;Lift    Examination-Participation Restrictions Laundry;Shop;Driving;Community Activity;Cleaning;Occupation;Church;Meal Prep    Stability/Clinical Decision Making Evolving/Moderate complexity    Clinical Decision Making Low    Rehab Potential Good    PT Frequency 2x / week    PT Duration 2 weeks    PT Treatment/Interventions ADLs/Self Care Home Management;Canalith Repostioning;Cryotherapy;Electrical Stimulation;DME Instruction;Ultrasound;Moist Heat;Gait training;Stair training;Functional mobility training;Therapeutic activities;Therapeutic exercise;Balance training;Neuromuscular re-education;Manual techniques;Patient/family education;Passive range of motion;Dry needling;Energy conservation;Vestibular;Taping    PT Next Visit Plan Continue strength, power , and coordination activities, power walking.    PT Home Exercise Plan Access Code [email or web address]    Consulted and Agree with Plan of Care Patient             Patient will benefit from skilled therapeutic intervention in order to improve the following deficits and impairments:  Abnormal gait, Decreased coordination, Decreased range of motion, Difficulty walking, Increased fascial restricitons, Dizziness, Decreased activity tolerance, Decreased safety awareness, Pain, Decreased balance, Impaired flexibility, Improper body mechanics, Postural dysfunction, Increased edema, Decreased strength, Decreased mobility  Visit Diagnosis: Muscle weakness (generalized)  Other lack of coordination  Unsteadiness on feet  Acute CVA (cerebrovascular accident) ([place])  Hemiplegia and hemiparesis following cerebral infarction affecting left non-dominant side (HCC)  Other abnormalities of gait and mobility     Problem List Patient Active Problem List   Diagnosis Date Noted    Acute ischemic cerebrovascular accident (CVA) involving anterior cerebral artery territory ([place]) 09/03/2020   Acute CVA (cerebrovascular accident) ([place]) 09/01/2020   Acute ischemic stroke ([place]) 08/31/2020   Aneurysm of anterior communicating artery 08/25/2020   Aneurysm, cerebral, nonruptured 08/25/2020   Microcytic anemia 10/26/2014   Vitamin D deficiency 10/17/2014   Stenosing tenosynovitis of thumb 10/17/2014   Internal hemorrhoids 10/17/2014   History of colon polyps 10/17/2014   Osteoarthritis of carpometacarpal joints of both thumbs 04/04/2014   Pre-diabetes 08/18/2013   History of TIA (transient ischemic attack) 08/12/2013   Routine health maintenance 08/15/2010   Hyperlipidemia 08/05/2008   Obesity 07/30/2007   TOBACCO DEPENDENCE 04/19/2006    Marcelina Morel, PT 01/06/2021, 2:21 PM  [place]. [place], [place], [address] Phone: [phone]   Fax:  [phone]  Name: ARDYTH KELSO MRN: [id number] Date of Birth: 1960/04/08

## 2021-01-06 NOTE — Therapy (Signed)
[place] [place] [place], [place], [address] Phone: [phone]   Fax:  [phone]  Speech Language Pathology Treatment  Patient Details  Name: Betty [place] MRN: [id number] Date of Birth: February 02, 1961 Referring Provider (SLP): Reesa Chew [place]   Encounter Date: 01/06/2021   End of Session - 01/06/21 1704     Visit Number 25    Number of Visits 17    Date for SLP Re-Evaluation 01/04/21    Authorization - Visit Number 8    Authorization - Number of Visits 8    SLP Start Time 1400    SLP Stop Time  1440    SLP Time Calculation (min) 40 min    Activity Tolerance Patient tolerated treatment well             Past Medical History:  Diagnosis Date   Hyperlipidemia    Stroke [facility])     Past Surgical History:  Procedure Laterality Date   BREAST BIOPSY      Core biopsy done on April 2003   COLONOSCOPY     IR 3D INDEPENDENT WKST  08/25/2020   IR 3D INDEPENDENT WKST  08/25/2020   IR ANGIO INTRA EXTRACRAN SEL INTERNAL CAROTID BILAT MOD SED  08/25/2020   IR ANGIO VERTEBRAL SEL VERTEBRAL BILAT MOD SED  08/25/2020   IR CT HEAD LTD  08/25/2020   IR INTRA CRAN STENT  08/25/2020   IR TRANSCATH/EMBOLIZ  08/25/2020   IR US GUIDE VASC ACCESS RIGHT  08/25/2020   PARTIAL HYSTERECTOMY   10/22/1999    Still has cervix   RADIOLOGY WITH ANESTHESIA N/A 08/25/2020   Procedure: IR WITH ANESTHESIA EMBOLIZATION;  Surgeon: Pedro Earls, MD;  Location: [place];  Service: Radiology;  Laterality: N/A;   TRIGGER FINGER RELEASE Left 12/25/2013   Procedure: LEFT THUMB TRIGGER RELEASE ;  Surgeon: Leanora Cover, MD;  Location: [place];  Service: Orthopedics;  Laterality: Left;    There were no vitals filed for this visit.   Subjective Assessment - 01/06/21 1704     Subjective "Doing good."    Currently in Pain? No/denies                   ADULT SLP TREATMENT - 01/06/21 0001       General Information    Behavior/Cognition Alert;Cooperative;Pleasant mood      Treatment Provided   Treatment provided Cognitive-Linquistic      Cognitive-Linquistic Treatment   Treatment focused on Cognition    Skilled Treatment Pt participated in "problem and solution" task where she identified the problem and generated potential solutions. Pt completed task with minA and benefited from verbal cues. Pt was instructed to participate in task where she was given two similar sentences and state the differences within each. SLP encouraged pt to utilize active listening skills (clarification- "Can you repeat that?") to complete task as needed.      Assessment / Recommendations / Plan   Plan Continue with current plan of care      Progression Toward Goals   Progression toward goals Progressing toward goals                SLP Short Term Goals - 12/28/20 1413       SLP SHORT TERM GOAL #1   Title Pt will recall 3 attention strategies to assist in increasing focus with minA.    Time 2    Period Weeks  Status Achieved   11/02/20   Target Date 12/21/20      SLP SHORT TERM GOAL #2   Title Pt will recall 3 memory strategies (external and/or internal) to assist in recall of important information with minA.    Time 2    Period Weeks    Status Achieved   11/02/20   Target Date 12/21/20      SLP SHORT TERM GOAL #3   Title Pt will identify problem and solution of given functional scenario with minA.    Time 2    Period Weeks    Status Achieved   To cont goal; goal not met   Target Date 12/21/20      SLP SHORT TERM GOAL #4   Title Pt will use visual aid to increase intiation and understanding when presented with a statement/question given <2 verbal cues during structured task across 2 sessions.    Time 2    Period Weeks    Status Achieved    Target Date 12/21/20              SLP Long Term Goals - 01/04/21 1717       SLP LONG TERM GOAL #1   Title Pt will demonstrate use of 3 attention strategies  in structured environment to increase likliehood of using in daily living.    Time 4    Period Weeks    Status Achieved   not met last period     SLP LONG TERM GOAL #2   Title Pt will demonstrate use of memory aids to assist with recall of important information.    Time 4    Period Weeks    Status Achieved   not met last period     SLP LONG TERM GOAL #3   Title Pt will organize medication into pill box with minA from pt family.    Time 8    Period Weeks    Status Not Met   Not met last session     SLP LONG TERM GOAL #4   Title Pt will use visual aid to increase intiation and understanding when presented with a statement/question given <2 verbal cues during unstructured task across 2 sessions.    Time 4    Period Weeks    Status Achieved              Plan - 01/06/21 1705     Clinical Impression Statement See tx note. Pt continues to make progress in processing information and verbal expression. To recert next session.    Speech Therapy Frequency 2x / week    Duration 4 weeks    Treatment/Interventions Functional tasks;Patient/family education;Cueing hierarchy;Environmental controls;Cognitive reorganization;Compensatory techniques;Internal/external aids;SLP instruction and feedback;Multimodal communcation approach    Potential to Achieve Goals Good    Consulted and Agree with Plan of Care Patient;Family member/caregiver    Family Member [facility] - son             Patient will benefit from skilled therapeutic intervention in order to improve the following deficits and impairments:   Cognitive communication deficit    Problem List Patient Active Problem List   Diagnosis Date Noted   Acute ischemic cerebrovascular accident (CVA) involving anterior cerebral artery territory ([place]) 09/03/2020   Acute CVA (cerebrovascular accident) ([place]) 09/01/2020   Acute ischemic stroke ([place]) 08/31/2020   Aneurysm of anterior communicating artery 08/25/2020   Aneurysm,  cerebral, nonruptured 08/25/2020   Microcytic anemia 10/26/2014   Vitamin D deficiency  10/17/2014   Stenosing tenosynovitis of thumb 10/17/2014   Internal hemorrhoids 10/17/2014   History of colon polyps 10/17/2014   Osteoarthritis of carpometacarpal joints of both thumbs 04/04/2014   Pre-diabetes 08/18/2013   History of TIA (transient ischemic attack) 08/12/2013   Routine health maintenance 08/15/2010   Hyperlipidemia 08/05/2008   Obesity 07/30/2007   TOBACCO DEPENDENCE 04/19/2006    Rosann Auerbach [place] MS, [place], CBIS  01/06/2021, 5:06 PM  [place]. [place], [place], [address] Phone: [phone]   Fax:  [phone]   Name: Betty [place] MRN: [id number] Date of Birth: 1960/10/31

## 2021-01-11 ENCOUNTER — Other Ambulatory Visit: Payer: Self-pay

## 2021-01-11 ENCOUNTER — Other Ambulatory Visit: Payer: Self-pay | Admitting: Radiology

## 2021-01-11 ENCOUNTER — Ambulatory Visit: Payer: 59 | Admitting: Speech Pathology

## 2021-01-11 ENCOUNTER — Encounter: Payer: Self-pay | Admitting: Physical Therapy

## 2021-01-11 ENCOUNTER — Ambulatory Visit: Payer: 59 | Admitting: Physical Therapy

## 2021-01-11 DIAGNOSIS — I69354 Hemiplegia and hemiparesis following cerebral infarction affecting left non-dominant side: Secondary | ICD-10-CM

## 2021-01-11 DIAGNOSIS — R278 Other lack of coordination: Secondary | ICD-10-CM

## 2021-01-11 DIAGNOSIS — I639 Cerebral infarction, unspecified: Secondary | ICD-10-CM

## 2021-01-11 DIAGNOSIS — R41841 Cognitive communication deficit: Secondary | ICD-10-CM

## 2021-01-11 DIAGNOSIS — R2689 Other abnormalities of gait and mobility: Secondary | ICD-10-CM

## 2021-01-11 DIAGNOSIS — M6281 Muscle weakness (generalized): Secondary | ICD-10-CM

## 2021-01-11 DIAGNOSIS — R2681 Unsteadiness on feet: Secondary | ICD-10-CM

## 2021-01-11 NOTE — Therapy (Signed)
[place]. [place], [place], [address] Phone: [phone]   Fax:  [phone]  January 11, 2021   No Recipients  Physical Therapy Discharge Summary  Patient: Betty [place]  MRN: [id number]  Date of Birth: [birth year]   Diagnosis: Muscle weakness (generalized)  Other lack of coordination  Unsteadiness on feet  Acute CVA (cerebrovascular accident) [facility])  Hemiplegia and hemiparesis following cerebral infarction affecting left non-dominant side ([place])  Other abnormalities of gait and mobility Referring Provider (PT): Love, Ivan Anchors, PA-C   The above patient had been seen in Physical Therapy 24 times of 24 treatments scheduled with 0 no shows and 0 cancellations.  The treatment consisted of Strengthening, balance training, motor learning, functional mobility re-education, speed of movement. The patient is: Improved  Subjective: Patient was initially nervous about D/C from PT, but today, verbalized that she feels ready.  Discharge Findings: All goals met.  Functional Status at Discharge: I with all mobility.  All Goals Met   Plan - 01/11/21 1351     Clinical Impression Statement Patient has met all LTG. HEP updated once more to continue to challenge patient with speed of movement, balance, coordination exercises added.    Personal Factors and Comorbidities Age;Comorbidity 3+;Time since onset of injury/illness/exacerbation;Fitness;Transportation    Comorbidities HLD, repair of right ACA aneurysm, left MCA aneurysm (unrepaired)    Examination-Activity Limitations Bathing;Locomotion Level;Transfers;Bend;Carry;Squat;Dressing;Stairs;Stand;Hygiene/Grooming;Lift    Examination-Participation Restrictions Laundry;Shop;Driving;Community Activity;Cleaning;Occupation;Church;Meal Prep    Stability/Clinical Decision Making Evolving/Moderate complexity    Clinical Decision Making Low    Rehab Potential Good    PT  Frequency 2x / week    PT Duration 2 weeks    PT Treatment/Interventions ADLs/Self Care Home Management;Canalith Repostioning;Cryotherapy;Electrical Stimulation;DME Instruction;Ultrasound;Moist Heat;Gait training;Stair training;Functional mobility training;Therapeutic activities;Therapeutic exercise;Balance training;Neuromuscular re-education;Manual techniques;Patient/family education;Passive range of motion;Dry needling;Energy conservation;Vestibular;Taping    PT Next Visit Plan Continue strength, power , and coordination activities, power walking.    PT Home Exercise Plan Access Code [email or web address]    Consulted and Agree with Plan of Care Patient             Sincerely,   Marcelina Morel, DPT   CC No Recipients  [place]. [place], [place], [address] Phone: [phone]   Fax:  [phone]  Patient: [name]  MRN: [id number]  Date of Birth: 1960-08-29

## 2021-01-11 NOTE — Patient Instructions (Signed)
Access Code: [email or web address] URL: [email or web address] Date: 01/11/2021 Prepared by: Ethel Rana  Exercises Single Leg Stance - 1 x daily - 7 x weekly - 3 reps - 30 sec hold Leg Swing Single Leg Balance - 1 x daily - 7 x weekly - 3 reps - 10 sec hold Sideways Walking - 1 x daily - 7 x weekly - 3 sets - 10 reps

## 2021-01-12 ENCOUNTER — Other Ambulatory Visit: Payer: Self-pay

## 2021-01-12 ENCOUNTER — Encounter (HOSPITAL_COMMUNITY): Payer: Self-pay

## 2021-01-12 NOTE — Progress Notes (Addendum)
PCP -  PA A. Johnnye Sima  Cardiologist - Denies  EP-Denies  Endocrine-Denies  Pulm-Denies  Chest x-ray - Denies  EKG - 08/31/20 (E)  Stress Test - Denies  ECHO - 09/02/20 (E)  Cardiac Cath - Denies  AICD-na PM-na LOOP-na  Dialysis-Denies  Sleep Study - Denies CPAP - Denies  LABS- 01/17/21: CBC w/D, BMP, PT, PTT, COVID  ASA- unknown- Radiology PA paged and a secure message sent with no response. Brillinta-unknown  ERAS- No  HA1C-Denies  Anesthesia- No  Interview done with her son Utah, and he denies the pt having chest pain, sob, or fever during the pre-op phone call. All instructions explained to Cooper, with a verbal understanding of the material including: as of today, stop taking all Aspirin (unless instructed by your doctor) and Other Aspirin containing products, Vitamins, Fish oils, and Herbal medications. Also stop all NSAIDS i.e. Advil, Ibuprofen, Motrin, Aleve, Anaprox, Naproxen, BC, Goody Powders, and all Supplements. [place] also instructed for him and the pt to wear a mask and social distance if she has to go out. The opportunity to ask questions was provided.    Coronavirus Screening  Have you experienced the following symptoms:  Cough yes/no: No Fever (>100.64F)  yes/no: No Runny nose yes/no: No Sore throat yes/no: No Difficulty breathing/shortness of breath  yes/no: No  Have you or a family member traveled in the last 14 days and where? yes/no: No   If the patient indicates "YES" to the above questions, their PAT will be rescheduled to limit the exposure to others and, the surgeon will be notified. THE PATIENT WILL NEED TO BE ASYMPTOMATIC FOR 14 DAYS.   If the patient is not experiencing any of these symptoms, the PAT nurse will instruct them to NOT bring anyone with them to their appointment since they may have these symptoms or traveled as well.   Please remind your patients and families that hospital visitation restrictions are in effect and the  importance of the restrictions.

## 2021-01-12 NOTE — Therapy (Signed)
[address]. [place], [place], [address] Phone: [phone]   Fax:  [phone]  Speech Language Pathology Treatment & Recertification  Patient Details  Name: Betty Archer MRN: [id number] Date of Birth: 01-18-1961 Referring Provider (SLP): Reesa Chew [place]   Encounter Date: 01/11/2021   End of Session - 01/11/21 1611     Visit Number 26    Date for SLP Re-Evaluation 02/11/21    Authorization - Visit Number 1    Authorization - Number of Visits 4    SLP Start Time 1400    SLP Stop Time  1440    SLP Time Calculation (min) 40 min    Activity Tolerance Patient tolerated treatment well             Past Medical History:  Diagnosis Date   Hyperlipidemia    Stroke [facility])     Past Surgical History:  Procedure Laterality Date   BREAST BIOPSY      Core biopsy done on April 2003   COLONOSCOPY     IR 3D INDEPENDENT WKST  08/25/2020   IR 3D INDEPENDENT WKST  08/25/2020   IR ANGIO INTRA EXTRACRAN SEL INTERNAL CAROTID BILAT MOD SED  08/25/2020   IR ANGIO VERTEBRAL SEL VERTEBRAL BILAT MOD SED  08/25/2020   IR CT HEAD LTD  08/25/2020   IR INTRA CRAN STENT  08/25/2020   IR TRANSCATH/EMBOLIZ  08/25/2020   IR US GUIDE VASC ACCESS RIGHT  08/25/2020   PARTIAL HYSTERECTOMY   10/22/1999    Still has cervix   RADIOLOGY WITH ANESTHESIA N/A 08/25/2020   Procedure: IR WITH ANESTHESIA EMBOLIZATION;  Surgeon: Pedro Earls, MD;  Location: [place];  Service: Radiology;  Laterality: N/A;   TRIGGER FINGER RELEASE Left 12/25/2013   Procedure: LEFT THUMB TRIGGER RELEASE ;  Surgeon: Leanora Cover, MD;  Location: [place];  Service: Orthopedics;  Laterality: Left;    There were no vitals filed for this visit.   Subjective Assessment - 01/11/21 1611     Subjective "My niece passed away."    Currently in Pain? No/denies              Treatment note  Behavior/Cognition: Alert; cooperative Treatment Provided:  Cognitive-Linguistic Treatment focused on: Cognition Skilled treatment: Pt participated in medication management tasks where she was instructed to fill out medication boxes using pill bottles. She completed task with spv and benefited from verbal cues in order to fill box with accuracy. Pt also was instructed to review medication sheets and cross out errors with filling medications. Pt was able to do this complete the "moderate task" independently; and required minA with "difficult level" and benefited fom verbal and gestural cues. SLP observed a reduction in time to complete medication management tasks in comparison to previous sessions. Plan: Cont with current POC Progression toward goals: Progressing towards.          SLP Short Term Goals - 01/12/21 1132       SLP SHORT TERM GOAL #1   Title Pt will identify 1 alternative solution with 80% accuracy and minA.    Time 2    Period Weeks    Status New   11/02/20   Target Date 01/26/21      SLP SHORT TERM GOAL #2   Title During a structured cognitive task, pt demonstrate error awareness by identifying errors through rechecking work with [place].    Time 2    Period  Weeks    Status New   11/02/20   Target Date 01/26/21      SLP SHORT TERM GOAL #3   Status --   To cont goal; goal not met             SLP Long Term Goals - 01/12/21 1137       SLP LONG TERM GOAL #1   Title Pt will identify 2 alternative solutions with 80% accuracy and minA.    Time 4    Period Weeks    Status New   not met last period   Target Date 02/09/21      SLP LONG TERM GOAL #2   Title During a structured cognitive task, pt will demonstrate error awareness by identifying errors through rechecking work with spv.    Time 4    Period Weeks    Status New   not met last period   Target Date 02/09/21      SLP LONG TERM GOAL #3   Title Pt will organize medication into pill box with minA from pt family.    Time 4    Period Weeks    Status On-going   Not  met last session   Target Date 02/09/21              Plan - 01/11/21 1612     Clinical Impression Statement See tx note. Pt continues to make progress in processing information and verbal expression.    Speech Therapy Frequency 2x / week    Duration 4 weeks    Treatment/Interventions Functional tasks;Patient/family education;Cueing hierarchy;Environmental controls;Cognitive reorganization;Compensatory techniques;Internal/external aids;SLP instruction and feedback;Multimodal communcation approach    Potential to Achieve Goals Good    Consulted and Agree with Plan of Care Patient;Family member/caregiver    Family Member [facility] - son             Patient will benefit from skilled therapeutic intervention in order to improve the following deficits and impairments:   Cognitive communication deficit    Problem List Patient Active Problem List   Diagnosis Date Noted   Acute ischemic cerebrovascular accident (CVA) involving anterior cerebral artery territory ([place]) 09/03/2020   Acute CVA (cerebrovascular accident) ([place]) 09/01/2020   Acute ischemic stroke ([place]) 08/31/2020   Aneurysm of anterior communicating artery 08/25/2020   Aneurysm, cerebral, nonruptured 08/25/2020   Microcytic anemia 10/26/2014   Vitamin D deficiency 10/17/2014   Stenosing tenosynovitis of thumb 10/17/2014   Internal hemorrhoids 10/17/2014   History of colon polyps 10/17/2014   Osteoarthritis of carpometacarpal joints of both thumbs 04/04/2014   Pre-diabetes 08/18/2013   History of TIA (transient ischemic attack) 08/12/2013   Routine health maintenance 08/15/2010   Hyperlipidemia 08/05/2008   Obesity 07/30/2007   TOBACCO DEPENDENCE 04/19/2006    [name], CCC-SLP 01/12/2021, 12:26 PM  [place]. [place], [place], [address] Phone: [phone]   Fax:  [phone]   Name: Betty Archer MRN: [id number] Date of  Birth: [birth year]

## 2021-01-14 NOTE — H&P (Deleted)
  The note originally documented on this encounter has been moved the the encounter in which it belongs.  
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
## 2021-01-16 NOTE — Anesthesia Preprocedure Evaluation (Addendum)
Anesthesia Evaluation  Patient identified by MRN, date of birth, ID band Patient awake    Reviewed: Allergy & Precautions, NPO status , Patient's Chart, lab work & pertinent test results  Airway Mallampati: III  TM Distance: >3 FB Neck ROM: Full    Dental no notable dental hx.    Pulmonary Current Smoker and Patient abstained from smoking.,    Pulmonary exam normal breath sounds clear to auscultation       Cardiovascular negative cardio ROS Normal cardiovascular exam Rhythm:Regular Rate:Normal  ECHO: 1. Left ventricular ejection fraction, by estimation, is 65 to 70%. The left ventricle has normal function. The left ventricle has no regional wall motion abnormalities. Left ventricular diastolic parameters are consistent with Grade II diastolic dysfunction (pseudonormalization). 2. Right ventricular systolic function is normal. The right ventricular size is normal. Tricuspid regurgitation signal is inadequate for assessing PA pressure. 3. The mitral valve is normal in structure. No evidence of mitral valve regurgitation. No evidence of mitral stenosis. 4. The aortic valve is tricuspid. Aortic valve regurgitation is not visualized. No aortic stenosis is present. 5. The inferior vena cava is normal in size with greater than 50% respiratory variability, suggesting right atrial pressure of 3 mmHg.   Neuro/Psych CVA negative psych ROS   GI/Hepatic negative GI ROS, Neg liver ROS,   Endo/Other  negative endocrine ROS  Renal/GU negative Renal ROS     Musculoskeletal  (+) Arthritis ,   Abdominal (+) + obese,   Peds  Hematology  (+) anemia , HLD   Anesthesia Other Findings BRAIN ANUERYSM  Reproductive/Obstetrics                            Anesthesia Physical Anesthesia Plan  ASA: 3  Anesthesia Plan: General   Post-op Pain Management:    Induction: Intravenous  PONV Risk Score and Plan: 2 and  Ondansetron, Dexamethasone and Treatment may vary due to age or medical condition  Airway Management Planned: Oral ETT  Additional Equipment: Arterial line  Intra-op Plan:   Post-operative Plan: Extubation in OR  Informed Consent: I have reviewed the patients History and Physical, chart, labs and discussed the procedure including the risks, benefits and alternatives for the proposed anesthesia with the patient or authorized representative who has indicated his/her understanding and acceptance.     Dental advisory given  Plan Discussed with: CRNA  Anesthesia Plan Comments:        Anesthesia Quick Evaluation

## 2021-01-17 ENCOUNTER — Ambulatory Visit (HOSPITAL_COMMUNITY): Payer: 59 | Admitting: Anesthesiology

## 2021-01-17 ENCOUNTER — Observation Stay (HOSPITAL_COMMUNITY)
Admission: RE | Admit: 2021-01-17 | Discharge: 2021-01-18 | Disposition: A | Discharge status: Home / Self Care | Payer: 59 | Source: Ambulatory Visit | Attending: Neuroradiology | Admitting: Neuroradiology

## 2021-01-17 ENCOUNTER — Other Ambulatory Visit: Payer: Self-pay

## 2021-01-17 ENCOUNTER — Ambulatory Visit (HOSPITAL_COMMUNITY)
Admission: RE | Admit: 2021-01-17 | Discharge: 2021-01-17 | Disposition: A | Discharge status: Home / Self Care | Payer: 59 | Source: Ambulatory Visit | Attending: Neuroradiology | Admitting: Neuroradiology

## 2021-01-17 ENCOUNTER — Encounter (HOSPITAL_COMMUNITY): Payer: Self-pay

## 2021-01-17 ENCOUNTER — Encounter (HOSPITAL_COMMUNITY)
Admission: RE | Disposition: A | Discharge status: Home / Self Care | Payer: Self-pay | Source: Ambulatory Visit | Attending: Neuroradiology

## 2021-01-17 DIAGNOSIS — M79604 Pain in right leg: Secondary | ICD-10-CM | POA: Diagnosis not present

## 2021-01-17 DIAGNOSIS — Z20822 Contact with and (suspected) exposure to covid-19: Secondary | ICD-10-CM | POA: Diagnosis not present

## 2021-01-17 DIAGNOSIS — I671 Cerebral aneurysm, nonruptured: Secondary | ICD-10-CM

## 2021-01-17 DIAGNOSIS — I729 Aneurysm of unspecified site: Secondary | ICD-10-CM | POA: Diagnosis present

## 2021-01-17 DIAGNOSIS — F17210 Nicotine dependence, cigarettes, uncomplicated: Secondary | ICD-10-CM | POA: Diagnosis not present

## 2021-01-17 DIAGNOSIS — I63529 Cerebral infarction due to unspecified occlusion or stenosis of unspecified anterior cerebral artery: Secondary | ICD-10-CM | POA: Diagnosis not present

## 2021-01-17 DIAGNOSIS — Z8616 Personal history of COVID-19: Secondary | ICD-10-CM | POA: Diagnosis not present

## 2021-01-17 DIAGNOSIS — Z7982 Long term (current) use of aspirin: Secondary | ICD-10-CM | POA: Diagnosis not present

## 2021-01-17 DIAGNOSIS — Z8673 Personal history of transient ischemic attack (TIA), and cerebral infarction without residual deficits: Secondary | ICD-10-CM | POA: Insufficient documentation

## 2021-01-17 DIAGNOSIS — M79605 Pain in left leg: Secondary | ICD-10-CM | POA: Diagnosis not present

## 2021-01-17 DIAGNOSIS — E559 Vitamin D deficiency, unspecified: Secondary | ICD-10-CM | POA: Diagnosis not present

## 2021-01-17 DIAGNOSIS — E785 Hyperlipidemia, unspecified: Secondary | ICD-10-CM | POA: Diagnosis not present

## 2021-01-17 HISTORY — PX: IR 3D INDEPENDENT WKST: IMG2385

## 2021-01-17 HISTORY — PX: IR TRANSCATH/EMBOLIZ: IMG695

## 2021-01-17 HISTORY — PX: RADIOLOGY WITH ANESTHESIA: SHX6223

## 2021-01-17 HISTORY — PX: IR US GUIDE VASC ACCESS RIGHT: IMG2390

## 2021-01-17 HISTORY — PX: IR ANGIO EXTRACRAN SEL COM CAROTID INNOMINATE UNI R MOD SED: IMG5356

## 2021-01-17 HISTORY — PX: IR CT HEAD LTD: IMG2386

## 2021-01-17 HISTORY — PX: IR ANGIO INTRA EXTRACRAN SEL INTERNAL CAROTID UNI L MOD SED: IMG5361

## 2021-01-17 LAB — BASIC METABOLIC PANEL
Anion gap: 7 (ref 5–15)
BUN: 13 mg/dL (ref 8–23)
CO2: 24 mmol/L (ref 22–32)
Calcium: 9.2 mg/dL (ref 8.9–10.3)
Chloride: 108 mmol/L (ref 98–111)
Creatinine, Ser: 0.78 mg/dL (ref 0.44–1.00)
GFR, Estimated: >60 mL/min (ref >60)
Glucose, Bld: 80 mg/dL (ref 70–99)
Potassium: 3.4 mmol/L — ABNORMAL LOW (ref 3.5–5.1)
Sodium: 139 mmol/L (ref 135–145)

## 2021-01-17 LAB — CBC WITH DIFFERENTIAL/PLATELET
Abs Immature Granulocytes: 0.04 10*3/uL (ref 0.00–0.07)
Basophils Absolute: 0 10*3/uL (ref 0.0–0.1)
Basophils Relative: 1 %
Eosinophils Absolute: 0.2 10*3/uL (ref 0.0–0.5)
Eosinophils Relative: 4 %
HCT: 35.3 % — ABNORMAL LOW (ref 36.0–46.0)
Hemoglobin: 10.7 g/dL — ABNORMAL LOW (ref 12.0–15.0)
Immature Granulocytes: 1 %
Lymphocytes Relative: 40 %
Lymphs Abs: 2.6 10*3/uL (ref 0.7–4.0)
MCH: 23.9 pg — ABNORMAL LOW (ref 26.0–34.0)
MCHC: 30.3 g/dL (ref 30.0–36.0)
MCV: 78.8 fL — ABNORMAL LOW (ref 80.0–100.0)
Monocytes Absolute: 0.5 10*3/uL (ref 0.1–1.0)
Monocytes Relative: 7 %
Neutro Abs: 3.2 10*3/uL (ref 1.7–7.7)
Neutrophils Relative %: 47 %
Platelets: 249 10*3/uL (ref 150–400)
RBC: 4.48 MIL/uL (ref 3.87–5.11)
RDW: 16.4 % — ABNORMAL HIGH (ref 11.5–15.5)
WBC: 6.6 10*3/uL (ref 4.0–10.5)
nRBC: 0 % (ref 0.0–0.2)

## 2021-01-17 LAB — MRSA NEXT GEN BY PCR, NASAL: MRSA by PCR Next Gen: NOT DETECTED

## 2021-01-17 LAB — POCT ACTIVATED CLOTTING TIME
Activated Clotting Time: 155 seconds
Activated Clotting Time: 269 seconds
Activated Clotting Time: 269 seconds

## 2021-01-17 LAB — PROTIME-INR
INR: 1.1 (ref 0.8–1.2)
Prothrombin Time: 14.1 seconds (ref 11.4–15.2)

## 2021-01-17 LAB — SARS CORONAVIRUS 2 BY RT PCR (HOSPITAL ORDER, PERFORMED IN ~~LOC~~ HOSPITAL LAB): SARS Coronavirus 2: NEGATIVE

## 2021-01-17 LAB — APTT: aPTT: 32 seconds (ref 24–36)

## 2021-01-17 SURGERY — RADIOLOGY WITH ANESTHESIA
Anesthesia: General

## 2021-01-17 MED ORDER — SUGAMMADEX SODIUM 200 MG/2ML IV SOLN
INTRAVENOUS | Status: DC | PRN
  Administered 2021-01-17: 360 mg via INTRAVENOUS

## 2021-01-17 MED ORDER — DEXAMETHASONE SODIUM PHOSPHATE 10 MG/ML IJ SOLN
INTRAMUSCULAR | Status: DC | PRN
  Administered 2021-01-17: 10 mg via INTRAVENOUS

## 2021-01-17 MED ORDER — CLEVIDIPINE BUTYRATE 0.5 MG/ML IV EMUL: 0.0000 mg/h | INTRAVENOUS | Status: DC

## 2021-01-17 MED ORDER — LIDOCAINE 2% (20 MG/ML) 5 ML SYRINGE
INTRAMUSCULAR | Status: DC | PRN
  Administered 2021-01-17: 60 mg via INTRAVENOUS

## 2021-01-17 MED ORDER — ACETAMINOPHEN 325 MG PO TABS
650.0000 mg | ORAL_TABLET | ORAL | Status: DC | PRN
  Administered 2021-01-17: 650 mg via ORAL
  Filled 2021-01-17: qty 2

## 2021-01-17 MED ORDER — FENTANYL CITRATE (PF) 100 MCG/2ML IJ SOLN
INTRAMUSCULAR | Status: AC
  Filled 2021-01-17: qty 2

## 2021-01-17 MED ORDER — ACETAMINOPHEN 500 MG PO TABS
1000.0000 mg | ORAL_TABLET | Freq: Once | ORAL | Status: AC
  Administered 2021-01-17: 1000 mg via ORAL
  Filled 2021-01-17: qty 2

## 2021-01-17 MED ORDER — PROPOFOL 10 MG/ML IV BOLUS
INTRAVENOUS | Status: DC | PRN
  Administered 2021-01-17: 30 mg via INTRAVENOUS
  Administered 2021-01-17: 20 mg via INTRAVENOUS
  Administered 2021-01-17: 200 mg via INTRAVENOUS

## 2021-01-17 MED ORDER — IOHEXOL 300 MG/ML  SOLN
100.0000 mL | Freq: Once | INTRAMUSCULAR | Status: AC | PRN
  Administered 2021-01-17: 60 mL via INTRA_ARTERIAL

## 2021-01-17 MED ORDER — ROCURONIUM BROMIDE 10 MG/ML (PF) SYRINGE
PREFILLED_SYRINGE | INTRAVENOUS | Status: DC | PRN
  Administered 2021-01-17: 60 mg via INTRAVENOUS
  Administered 2021-01-17 (×2): 40 mg via INTRAVENOUS

## 2021-01-17 MED ORDER — OXYCODONE-ACETAMINOPHEN 5-325 MG PO TABS: 1.0000 | ORAL_TABLET | Freq: Four times a day (QID) | ORAL | Status: DC | PRN

## 2021-01-17 MED ORDER — OXYCODONE-ACETAMINOPHEN 5-325 MG PO TABS
1.0000 | ORAL_TABLET | Freq: Four times a day (QID) | ORAL | Status: DC | PRN
  Administered 2021-01-17: 1 via ORAL
  Filled 2021-01-17: qty 1

## 2021-01-17 MED ORDER — PHENYLEPHRINE 40 MCG/ML (10ML) SYRINGE FOR IV PUSH (FOR BLOOD PRESSURE SUPPORT)
PREFILLED_SYRINGE | INTRAVENOUS | Status: DC | PRN
  Administered 2021-01-17: 80 ug via INTRAVENOUS
  Administered 2021-01-17: 40 ug via INTRAVENOUS
  Administered 2021-01-17: 80 ug via INTRAVENOUS

## 2021-01-17 MED ORDER — ONDANSETRON HCL 4 MG/2ML IJ SOLN
INTRAMUSCULAR | Status: DC | PRN
  Administered 2021-01-17: 4 mg via INTRAVENOUS

## 2021-01-17 MED ORDER — AMISULPRIDE (ANTIEMETIC) 5 MG/2ML IV SOLN: 10.0000 mg | Freq: Once | INTRAVENOUS | Status: DC | PRN

## 2021-01-17 MED ORDER — TICAGRELOR 90 MG PO TABS: 90.0000 mg | ORAL_TABLET | Freq: Two times a day (BID) | ORAL | Status: DC

## 2021-01-17 MED ORDER — PROMETHAZINE HCL 25 MG/ML IJ SOLN: 6.2500 mg | INTRAMUSCULAR | Status: DC | PRN

## 2021-01-17 MED ORDER — CEFAZOLIN SODIUM-DEXTROSE 2-4 GM/100ML-% IV SOLN
2.0000 g | Freq: Once | INTRAVENOUS | Status: DC
  Filled 2021-01-17: qty 100

## 2021-01-17 MED ORDER — CHLORHEXIDINE GLUCONATE CLOTH 2 % EX PADS
6.0000 | MEDICATED_PAD | Freq: Every day | CUTANEOUS | Status: DC
  Administered 2021-01-17: 6 via TOPICAL

## 2021-01-17 MED ORDER — IOHEXOL 300 MG/ML  SOLN: 100.0000 mL | Freq: Once | INTRAMUSCULAR | Status: DC | PRN

## 2021-01-17 MED ORDER — CHLORHEXIDINE GLUCONATE 0.12 % MT SOLN
15.0000 mL | Freq: Once | OROMUCOSAL | Status: AC
  Administered 2021-01-17: 15 mL via OROMUCOSAL
  Filled 2021-01-17: qty 15

## 2021-01-17 MED ORDER — ACETAMINOPHEN 160 MG/5ML PO SOLN: 650.0000 mg | ORAL | Status: DC | PRN

## 2021-01-17 MED ORDER — NIMODIPINE 30 MG PO CAPS
0.0000 mg | ORAL_CAPSULE | ORAL | Status: DC
  Filled 2021-01-17: qty 1

## 2021-01-17 MED ORDER — TICAGRELOR 90 MG PO TABS
90.0000 mg | ORAL_TABLET | Freq: Two times a day (BID) | ORAL | Status: DC
  Administered 2021-01-17 – 2021-01-18 (×2): 90 mg via ORAL
  Filled 2021-01-17 (×2): qty 1

## 2021-01-17 MED ORDER — HEPARIN SODIUM (PORCINE) 1000 UNIT/ML IJ SOLN
INTRAMUSCULAR | Status: DC | PRN
  Administered 2021-01-17: 5000 [IU] via INTRAVENOUS

## 2021-01-17 MED ORDER — SODIUM CHLORIDE 0.9 % IV SOLN: INTRAVENOUS | Status: DC

## 2021-01-17 MED ORDER — ORAL CARE MOUTH RINSE: 15.0000 mL | Freq: Once | OROMUCOSAL | Status: AC

## 2021-01-17 MED ORDER — VERAPAMIL HCL 2.5 MG/ML IV SOLN
INTRAVENOUS | Status: AC
  Filled 2021-01-17: qty 2

## 2021-01-17 MED ORDER — FENTANYL CITRATE (PF) 100 MCG/2ML IJ SOLN
INTRAMUSCULAR | Status: AC
  Administered 2021-01-17: 25 ug via INTRAVENOUS
  Filled 2021-01-17: qty 2

## 2021-01-17 MED ORDER — ASPIRIN 81 MG PO CHEW: 81.0000 mg | CHEWABLE_TABLET | Freq: Every day | ORAL | Status: DC

## 2021-01-17 MED ORDER — FENTANYL CITRATE (PF) 100 MCG/2ML IJ SOLN: 25.0000 ug | INTRAMUSCULAR | Status: DC | PRN

## 2021-01-17 MED ORDER — LACTATED RINGERS IV SOLN: INTRAVENOUS | Status: DC | PRN

## 2021-01-17 MED ORDER — ASPIRIN 81 MG PO CHEW
81.0000 mg | CHEWABLE_TABLET | Freq: Every day | ORAL | Status: DC
  Administered 2021-01-18: 81 mg via ORAL
  Filled 2021-01-17: qty 1

## 2021-01-17 MED ORDER — EPHEDRINE SULFATE-NACL 50-0.9 MG/10ML-% IV SOSY
PREFILLED_SYRINGE | INTRAVENOUS | Status: DC | PRN
  Administered 2021-01-17: 5 mg via INTRAVENOUS

## 2021-01-17 MED ORDER — FENTANYL CITRATE (PF) 250 MCG/5ML IJ SOLN
INTRAMUSCULAR | Status: DC | PRN
  Administered 2021-01-17: 100 ug via INTRAVENOUS

## 2021-01-17 MED ORDER — CEFAZOLIN SODIUM-DEXTROSE 2-3 GM-%(50ML) IV SOLR
INTRAVENOUS | Status: DC | PRN
  Administered 2021-01-17: 2 g via INTRAVENOUS

## 2021-01-17 MED ORDER — CLEVIDIPINE BUTYRATE 0.5 MG/ML IV EMUL
INTRAVENOUS | Status: AC
  Filled 2021-01-17: qty 50

## 2021-01-17 MED ORDER — ACETAMINOPHEN 650 MG RE SUPP: 650.0000 mg | RECTAL | Status: DC | PRN

## 2021-01-17 MED ORDER — ONDANSETRON HCL 4 MG/2ML IJ SOLN: 4.0000 mg | Freq: Four times a day (QID) | INTRAMUSCULAR | Status: DC | PRN

## 2021-01-17 MED ORDER — OXYCODONE HCL 5 MG PO TABS: 5.0000 mg | ORAL_TABLET | Freq: Once | ORAL | Status: DC | PRN

## 2021-01-17 MED ORDER — OXYCODONE HCL 5 MG/5ML PO SOLN: 5.0000 mg | Freq: Once | ORAL | Status: DC | PRN

## 2021-01-17 MED ORDER — GLYCOPYRROLATE PF 0.2 MG/ML IJ SOSY
PREFILLED_SYRINGE | INTRAMUSCULAR | Status: DC | PRN
  Administered 2021-01-17: .2 mg via INTRAVENOUS

## 2021-01-17 MED ORDER — VERAPAMIL HCL 2.5 MG/ML IV SOLN
INTRAVENOUS | Status: AC | PRN
  Administered 2021-01-17: 5 mg via INTRA_ARTERIAL

## 2021-01-17 MED ORDER — PHENYLEPHRINE HCL-NACL 20-0.9 MG/250ML-% IV SOLN
INTRAVENOUS | Status: DC | PRN
  Administered 2021-01-17: 25 ug/min via INTRAVENOUS

## 2021-01-17 NOTE — Anesthesia Postprocedure Evaluation (Signed)
Anesthesia Post Note  Patient: Betty Archer  Procedure(s) Performed: RADIOLOGY WITH ANESTHESIA  EMBOLIZATION     Patient location during evaluation: PACU Anesthesia Type: General Level of consciousness: awake Pain management: pain level controlled Vital Signs Assessment: post-procedure vital signs reviewed and stable Respiratory status: spontaneous breathing, nonlabored ventilation, respiratory function stable and patient connected to nasal cannula oxygen Cardiovascular status: blood pressure returned to baseline and stable Postop Assessment: no apparent nausea or vomiting Anesthetic complications: no   No notable events documented.  Last Vitals:  Vitals:   01/17/21 1900 01/17/21 2000  BP: 119/73   Pulse: 99   Resp: 17   Temp:  36.9 C  SpO2: 97%     Last Pain:  Vitals:   01/17/21 2000  TempSrc: Oral  PainSc:                  Karyl Kinnier Emile Kyllo

## 2021-01-17 NOTE — Anesthesia Procedure Notes (Signed)
Arterial Line Insertion Start/End11/28/2022 8:20 AM, 01/17/2021 8:30 AM Performed by: Murvin Natal, MD, anesthesiologist  Patient location: Pre-op. Preanesthetic checklist: patient identified, IV checked, site marked, risks and benefits discussed, surgical consent, monitors and equipment checked, pre-op evaluation, timeout performed and anesthesia consent Lidocaine 1% used for infiltration Left, radial was placed Catheter size: 20 G Hand hygiene performed , maximum sterile barriers used  and Seldinger technique used  Attempts: 1 (Previous attempts by CRNA) Procedure performed using ultrasound guided technique. Ultrasound Notes:anatomy identified, needle tip was noted to be adjacent to the nerve/plexus identified and no ultrasound evidence of intravascular and/or intraneural injection Following insertion, dressing applied and Biopatch. Post procedure assessment: normal and unchanged  Patient tolerated the procedure well with no immediate complications.

## 2021-01-17 NOTE — Procedures (Signed)
INTERVENTIONAL NEURORADIOLOGY BRIEF POSTPROCEDURE NOTE  Diagnostic cerebral angiogram and endovascular aneurysm embolization   Attending: Dr. Pedro Earls  Assistant: None.   Diagnosis: Left MCA bifurcation aneurysm.   Access site: Right common femoral artery.   Access closure: Perclose pro style.   Anesthesia: General anesthesia.   Medication used: 5000 units of heparin IV; 2 g of Ancef.  Complications: None.   Estimated blood loss: Negligible.   Specimen: None.   Findings: A 7 x 5 mm left MCA bifurcation aneurysm identified, treated with a 7 x 4 mm web device with good aneurysm occlusion.  No evidence of thromboembolic or hemorrhagic complication.   The patient tolerated the procedure well without incident or complication and is in stable condition.    PLAN: Continue on dual anti-platelet therapy with aspirin and Brilinta.  Bed rest x6 hours.  SBP 100-160 mmHg.

## 2021-01-17 NOTE — Anesthesia Procedure Notes (Addendum)
Procedure Name: Intubation Date/Time: 01/17/2021 9:00 AM Performed by: Bryson Corona, CRNA Pre-anesthesia Checklist: Patient identified, Emergency Drugs available, Suction available and Patient being monitored Patient Re-evaluated:Patient Re-evaluated prior to induction Oxygen Delivery Method: Circle System Utilized Preoxygenation: Pre-oxygenation with 100% oxygen Induction Type: IV induction Ventilation: Mask ventilation without difficulty Laryngoscope Size: Mac and 3 Grade View: Grade I Tube type: Oral Tube size: 7.0 mm Number of attempts: 1 Airway Equipment and Method: Stylet Placement Confirmation: ETT inserted through vocal cords under direct vision, positive ETCO2 and breath sounds checked- equal and bilateral Secured at: 22 cm Tube secured with: Tape Dental Injury: Teeth and Oropharynx as per pre-operative assessment  Comments: Quarter size mass noted on hard palate during intubation. Did not impact view for intubation. Dr. Roanna Banning and Dr. Debbrah Alar made aware.

## 2021-01-17 NOTE — Sedation Documentation (Addendum)
Right groin sheath removed. Perclose deployed, quickclot.

## 2021-01-17 NOTE — Transfer of Care (Signed)
Immediate Anesthesia Transfer of Care Note  Patient: [name]  Procedure(s) Performed: RADIOLOGY WITH ANESTHESIA  EMBOLIZATION  Patient Location: PACU  Anesthesia Type:General  Level of Consciousness: drowsy  Airway & Oxygen Therapy: Patient Spontanous Breathing and Patient connected to face mask oxygen  Post-op Assessment: Report given to RN, Post -op Vital signs reviewed and stable and Patient moving all extremities X 4  Post vital signs: Reviewed and stable  Last Vitals:  Vitals Value Taken Time  BP 119/97 01/17/21 1213  Temp    Pulse 93 01/17/21 1216  Resp 20 01/17/21 1216  SpO2 100 % 01/17/21 1216  Vitals shown include unvalidated device data.  Last Pain:  Vitals:   01/17/21 0645  TempSrc:   PainSc: 0-No pain      Patients Stated Pain Goal: 0 (77/41/28 7867)  Complications: No notable events documented.

## 2021-01-17 NOTE — Progress Notes (Signed)
NIR brief progress note:  Patient seen on 4N with Dr Karenann Cai, reports bilateral leg pain similar to reported LLE pain which has been present since previous NIR intervention - no abdominal pain. She is tolerating liquids, has not tried solids yet. No headache, n/v, dizziness, visual changes, dyspnea, chest pain.  Right CFA puncture site clean, dry, dressed. Soft, appropriately tender, no active bleeding or drainage. Left groin soft, non tender to palpation Abdomen/pelvis soft, non tender  Tylenol PRN for pain D/c IVF if adequate PO fluid intake NIR will follow up tomorrow AM for d/c if stable overnight Page on call NIR MD with overnight concerns  Candiss Norse, PA-C

## 2021-01-18 ENCOUNTER — Ambulatory Visit: Payer: 59 | Admitting: Speech Pathology

## 2021-01-18 ENCOUNTER — Encounter (HOSPITAL_COMMUNITY): Payer: Self-pay | Admitting: Neuroradiology

## 2021-01-18 ENCOUNTER — Ambulatory Visit: Payer: 59 | Admitting: Physical Therapy

## 2021-01-18 DIAGNOSIS — I671 Cerebral aneurysm, nonruptured: Secondary | ICD-10-CM | POA: Diagnosis not present

## 2021-01-18 DIAGNOSIS — I729 Aneurysm of unspecified site: Secondary | ICD-10-CM | POA: Diagnosis present

## 2021-01-18 NOTE — Discharge Instructions (Signed)
Femoral Site Care This sheet gives you information about how to care for yourself after your procedure. Your health care provider may also give you more specific instructions. If you have problems or questions, contact your health care provider. What can I expect after the procedure? After the procedure, it is common to have: Bruising that usually fades within 1-2 weeks. Tenderness at the site. Follow these instructions at home: Wound care Follow instructions from your health care provider about how to take care of your insertion site. Make sure you: Wash your hands with soap and water before you change your bandage (dressing). If soap and water are not available, use hand sanitizer. Change your dressing as directed- pressure dressing removed 24 hours post-procedure (and switch for bandaid), bandaid removed 72 hours post-procedure Do not take baths, swim, or use a hot tub for 7 days post-procedure. You may shower 48 hours after the procedure or as told by your health care provider. Gently wash the site with plain soap and water. Pat the area dry with a clean towel. Do not rub the site. This may cause bleeding. Check your site every day for signs of infection. Check for: Redness, swelling, or pain. Fluid or blood. Warmth. Pus or a bad smell. Activity Do not stoop, bend, or lift anything that is heavier than 10 lb (4.5 kg) for 2 weeks post-procedure. Do not drive self for 2 weeks post-procedure. Contact a health care provider if you have: A fever or chills. You have redness, swelling, or pain around your insertion site. Get help right away if: The catheter insertion area swells very fast. You pass out. You suddenly start to sweat or your skin gets clammy. The catheter insertion area is bleeding, and the bleeding does not stop when you hold steady pressure on the area. The area near or just beyond the catheter insertion site becomes pale, cool, tingly, or numb. These symptoms may  represent a serious problem that is an emergency. Do not wait to see if the symptoms will go away. Get medical help right away. Call your local emergency services (911 in the [place] Do not drive yourself to the hospital.  This information is not intended to replace advice given to you by your health care provider. Make sure you discuss any questions you have with your health care provider. Document Revised: 02/19/2017 Document Reviewed: 02/19/2017 [employer] Patient Education  2020 [employer]  

## 2021-01-18 NOTE — Progress Notes (Signed)
OT Cancellation Note  Patient Details Name: Betty Archer MRN: [id number] DOB: Sep 22, 1960   Cancelled Treatment:    Reason Eval/Treat Not Completed: Other (comment) (Per Nsg, OT/PT orders cancelled.)  [facility] 01/18/2021, 11:06 AM Maurie Boettcher, OT/L   Acute OT Clinical Specialist Acute Rehabilitation Services Pager [phone] Office [phone]

## 2021-01-18 NOTE — Discharge Summary (Signed)
Patient ID: Betty Archer MRN: [id number] DOB/AGE: 61/22/1962 61 y.o.  Admit date: 01/17/2021 Discharge date: 01/18/2021  Supervising Physician: Pedro Earls  Patient Status: [facility] - In-pt  Admission Diagnoses: Left MCA aneurysm  Discharge Diagnoses:  Principal Problem:   Aneurysm, cerebral, nonruptured   Discharged Condition: good  Hospital Course: [name] presented to [facility] on 11/28 for planned cerebral angiogram with embolization of left MCA aneurysm, the procedure was performed without complication and she was admitted for overnight observation. Upon reassessment yesterday afternoon patient reported headache which was not resolved with tylenol, she received Percocet 5/325 mg x 1 which effectively controlled her pain. She also reported bilateral leg pain without associated abdominal/pelvic pain. She remained stable overnight and is seen today for discharge.  [name] reports bilateral leg pain and left leg weakness which is unchanged from prior to procedure. She does not have a headache this morning. She has not yet been out of bed or had anything to eat. She has been tolerating PO fluids well so far. Reviewed need to continue Brilinta 90 mg BID + ASA 81 mg QD as well as ED return precautions. She will follow up with Dr Tennis Must Sindy Messing in 1 week.  Consults: None  Significant Diagnostic Studies: IR Transcath/Emboliz  Result Date: 01/17/2021 INDICATION: Betty Archer is a 60 year old female with a past medical history significant for HLD, tobacco use, TIA, right ACA aneurysm s/p stent assisted embolization 08/25/20 and right ACA territory infarct 08/31/20. ACA infarct was initially felt to be related to in stent thrombosis however she reported good compliance with Brilinta 90 mg BID + ASA 81 mg QD and her p2y12 was found to be in therapeutic range. Therefore, the possibility of hypercoagulable state related to COVID-19 was raised. She made a great recovery and  returns today for elective treatment of her left MCA bifurcation aneurysm. EXAM: ULTRASOUND-GUIDED VASCULAR ACCESS DIAGNOSTIC CEREBRAL ANGIOGRAM 3D ROTATIONAL ANGIOGRAM ENDOVASCULAR ANEURYSM EMBOLIZATION FLAT PANEL HEAD CT COMPARISON:  Cerebral angiogram August 25, 2020. MEDICATIONS: Ancef 2g IV. The antibiotic was administered within 1 hour of the procedure ANESTHESIA/SEDATION: The procedure was performed under general anesthesia. CONTRAST:  120 mL Omnipaque 300 mg/mL FLUOROSCOPY TIME:  Fluoroscopy Time: 34 minutes 30 seconds (2037 mGy). COMPLICATIONS: None immediate. TECHNIQUE: Informed written consent was obtained from the patient after a thorough discussion of the procedural risks, benefits and alternatives. All questions were addressed. Maximal Sterile Barrier Technique was utilized including caps, mask, sterile gowns, sterile gloves, sterile drape, hand hygiene and skin antiseptic. A timeout was performed prior to the initiation of the procedure. The right groin was prepped and draped in the usual sterile fashion. Using a micropuncture kit and the modified Seldinger technique, access was gained to the right common femoral artery and an 8 French sheath was placed. Real-time ultrasound guidance was utilized for vascular access including the acquisition of a permanent ultrasound image documenting patency of the accessed vessel. Under fluoroscopy, a 6 [place] Berenstein 2 catheter was navigated over a 0.035" Terumo Glidewire into the aortic arch. The catheter was placed into the right common carotid artery and then advanced into the right internal carotid artery under fluoroscopy guidance. Frontal and lateral angiograms of the head were obtained followed by magnified waters and magnified lateral views of the head, centered on the anterior communicating artery. The catheter was retracted and placed into the left carotid artery. Frontal and lateral angiograms of the neck were obtained. Under biplane roadmap the catheter  advanced into the  left internal carotid artery. Frontal and lateral angiograms of the head were obtained. 3D rotational angiograms were acquired and post processed in a separate workstation under concurrent attending physician supervision. Selected images were sent to PACS. Then, magnified left anterior oblique and magnified lateral views of the head were obtained. FINDINGS: 1. Patent right common femoral artery with adequate caliber for vascular access. 2. Status post treatment of an anterior communicating artery aneurysm with stent assisted coiling using "Y" stenting technique. There is complete occlusion of the aneurysm. Intracranial stent spanning the right A1 and bilateral A2 segments are patent with brisk anterograde flow. No new aneurysm the right anterior circulation identified. 3. Irregularly-shaped and multilobulated left MCA bifurcation aneurysm identified, measuring approximately 7.6 mm height, 4.2 mm transverse in the distal portion of the aneurysm and 5.5 mm transverse in the proximal aspect of the aneurysm. PROCEDURE: Under roadmap, the Berenstein 2 catheter was exchanged over the wire for a trackstar guide catheter which was placed in the distal cervical segment of the left ICA. Magnified left anterior oblique and magnified lateral angiograms of the head were obtained in the working projections. Using biplane roadmap, a Sofia EX distal access catheter was navigated over a via 17 microcatheter and an Aristotle 14 micro guidewire into the cavernous segment of the right ICA. The microcatheter was then advanced over the microwire into the left MCA bifurcation aneurysm pouch. Then, a 6 x 4 mm web device was deployed into the aneurysm. Left internal carotid artery angiograms with magnified left anterior oblique and magnified lateral views of the head were obtained. Suboptimal coverage of the aneurysm base was observed. The device was subsequently recaptured. Next, a 7 x 4 mm web device was navigated and  deployed within the aneurysm pouch. Left internal carotid artery angiograms with magnified left anterior oblique and magnified lateral views of the head were obtained. Adequate aneurysm filling by the device was noted. The device was subsequently detached. The microcatheter was retrieved. Left internal carotid artery angiograms with magnified left anterior oblique and magnified lateral views of the head were obtained. Stable position of the device noted. Left internal carotid artery angiograms with frontal and lateral views of the entire head were then obtained. No evidence of thromboembolic complication seen. Flat panel CT of the head was obtained and post processed in a separate workstation with concurrent attending physician supervision. Selected images were sent to PACS. No evidence of hemorrhagic complication. The catheter was subsequently withdrawn. A right common femoral artery angiogram was obtained in right anterior oblique view. The puncture is at the mid right common femoral artery which has normal caliber, adequate for closure device. The femoral sheath was exchanged over the wire for a Perclose pro style which was utilized for access closure. Immediate hemostasis was achieved. IMPRESSION: 1. Successful endovascular treatment of a left MCA bifurcation aneurysm with a web device with adequate aneurysm occlusion. 2. No evidence of hemorrhagic or thromboembolic complication. PLAN: 1. Patient will continue on dual anti-platelet therapy due to previously implanted bilateral anterior cerebral artery stents for treatment of anterior communicating artery aneurysm. 2. An MR angiogram will be obtained in 6 months to evaluate treatment outcome. Electronically Signed   By: Pedro Earls M.D.   On: 01/17/2021 15:30   IR 3D Independent Darreld Mclean  Result Date: 01/17/2021 INDICATION: Betty Archer is a 60 year old female with a past medical history significant for HLD, tobacco use, TIA, right ACA  aneurysm s/p stent assisted embolization 08/25/20 and right ACA territory infarct 08/31/20.  ACA infarct was initially felt to be related to in stent thrombosis however she reported good compliance with Brilinta 90 mg BID + ASA 81 mg QD and her p2y12 was found to be in therapeutic range. Therefore, the possibility of hypercoagulable state related to COVID-19 was raised. She made a great recovery and returns today for elective treatment of her left MCA bifurcation aneurysm. EXAM: ULTRASOUND-GUIDED VASCULAR ACCESS DIAGNOSTIC CEREBRAL ANGIOGRAM 3D ROTATIONAL ANGIOGRAM ENDOVASCULAR ANEURYSM EMBOLIZATION FLAT PANEL HEAD CT COMPARISON:  Cerebral angiogram August 25, 2020. MEDICATIONS: Ancef 2g IV. The antibiotic was administered within 1 hour of the procedure ANESTHESIA/SEDATION: The procedure was performed under general anesthesia. CONTRAST:  120 mL Omnipaque 300 mg/mL FLUOROSCOPY TIME:  Fluoroscopy Time: 34 minutes 30 seconds (2037 mGy). COMPLICATIONS: None immediate. TECHNIQUE: Informed written consent was obtained from the patient after a thorough discussion of the procedural risks, benefits and alternatives. All questions were addressed. Maximal Sterile Barrier Technique was utilized including caps, mask, sterile gowns, sterile gloves, sterile drape, hand hygiene and skin antiseptic. A timeout was performed prior to the initiation of the procedure. The right groin was prepped and draped in the usual sterile fashion. Using a micropuncture kit and the modified Seldinger technique, access was gained to the right common femoral artery and an 8 French sheath was placed. Real-time ultrasound guidance was utilized for vascular access including the acquisition of a permanent ultrasound image documenting patency of the accessed vessel. Under fluoroscopy, a 6 [place] Berenstein 2 catheter was navigated over a 0.035" Terumo Glidewire into the aortic arch. The catheter was placed into the right common carotid artery and then advanced  into the right internal carotid artery under fluoroscopy guidance. Frontal and lateral angiograms of the head were obtained followed by magnified waters and magnified lateral views of the head, centered on the anterior communicating artery. The catheter was retracted and placed into the left carotid artery. Frontal and lateral angiograms of the neck were obtained. Under biplane roadmap the catheter advanced into the left internal carotid artery. Frontal and lateral angiograms of the head were obtained. 3D rotational angiograms were acquired and post processed in a separate workstation under concurrent attending physician supervision. Selected images were sent to PACS. Then, magnified left anterior oblique and magnified lateral views of the head were obtained. FINDINGS: 1. Patent right common femoral artery with adequate caliber for vascular access. 2. Status post treatment of an anterior communicating artery aneurysm with stent assisted coiling using "Y" stenting technique. There is complete occlusion of the aneurysm. Intracranial stent spanning the right A1 and bilateral A2 segments are patent with brisk anterograde flow. No new aneurysm the right anterior circulation identified. 3. Irregularly-shaped and multilobulated left MCA bifurcation aneurysm identified, measuring approximately 7.6 mm height, 4.2 mm transverse in the distal portion of the aneurysm and 5.5 mm transverse in the proximal aspect of the aneurysm. PROCEDURE: Under roadmap, the Berenstein 2 catheter was exchanged over the wire for a trackstar guide catheter which was placed in the distal cervical segment of the left ICA. Magnified left anterior oblique and magnified lateral angiograms of the head were obtained in the working projections. Using biplane roadmap, a Sofia EX distal access catheter was navigated over a via 17 microcatheter and an Aristotle 14 micro guidewire into the cavernous segment of the right ICA. The microcatheter was then  advanced over the microwire into the left MCA bifurcation aneurysm pouch. Then, a 6 x 4 mm web device was deployed into the aneurysm. Left internal carotid artery angiograms with  magnified left anterior oblique and magnified lateral views of the head were obtained. Suboptimal coverage of the aneurysm base was observed. The device was subsequently recaptured. Next, a 7 x 4 mm web device was navigated and deployed within the aneurysm pouch. Left internal carotid artery angiograms with magnified left anterior oblique and magnified lateral views of the head were obtained. Adequate aneurysm filling by the device was noted. The device was subsequently detached. The microcatheter was retrieved. Left internal carotid artery angiograms with magnified left anterior oblique and magnified lateral views of the head were obtained. Stable position of the device noted. Left internal carotid artery angiograms with frontal and lateral views of the entire head were then obtained. No evidence of thromboembolic complication seen. Flat panel CT of the head was obtained and post processed in a separate workstation with concurrent attending physician supervision. Selected images were sent to PACS. No evidence of hemorrhagic complication. The catheter was subsequently withdrawn. A right common femoral artery angiogram was obtained in right anterior oblique view. The puncture is at the mid right common femoral artery which has normal caliber, adequate for closure device. The femoral sheath was exchanged over the wire for a Perclose pro style which was utilized for access closure. Immediate hemostasis was achieved. IMPRESSION: 1. Successful endovascular treatment of a left MCA bifurcation aneurysm with a web device with adequate aneurysm occlusion. 2. No evidence of hemorrhagic or thromboembolic complication. PLAN: 1. Patient will continue on dual anti-platelet therapy due to previously implanted bilateral anterior cerebral artery stents for  treatment of anterior communicating artery aneurysm. 2. An MR angiogram will be obtained in 6 months to evaluate treatment outcome. Electronically Signed   By: Pedro Earls M.D.   On: 01/17/2021 15:30   IR CT Head Ltd  Result Date: 01/17/2021 INDICATION: Betty Archer is a 60 year old female with a past medical history significant for HLD, tobacco use, TIA, right ACA aneurysm s/p stent assisted embolization 08/25/20 and right ACA territory infarct 08/31/20. ACA infarct was initially felt to be related to in stent thrombosis however she reported good compliance with Brilinta 90 mg BID + ASA 81 mg QD and her p2y12 was found to be in therapeutic range. Therefore, the possibility of hypercoagulable state related to COVID-19 was raised. She made a great recovery and returns today for elective treatment of her left MCA bifurcation aneurysm. EXAM: ULTRASOUND-GUIDED VASCULAR ACCESS DIAGNOSTIC CEREBRAL ANGIOGRAM 3D ROTATIONAL ANGIOGRAM ENDOVASCULAR ANEURYSM EMBOLIZATION FLAT PANEL HEAD CT COMPARISON:  Cerebral angiogram August 25, 2020. MEDICATIONS: Ancef 2g IV. The antibiotic was administered within 1 hour of the procedure ANESTHESIA/SEDATION: The procedure was performed under general anesthesia. CONTRAST:  120 mL Omnipaque 300 mg/mL FLUOROSCOPY TIME:  Fluoroscopy Time: 34 minutes 30 seconds (2037 mGy). COMPLICATIONS: None immediate. TECHNIQUE: Informed written consent was obtained from the patient after a thorough discussion of the procedural risks, benefits and alternatives. All questions were addressed. Maximal Sterile Barrier Technique was utilized including caps, mask, sterile gowns, sterile gloves, sterile drape, hand hygiene and skin antiseptic. A timeout was performed prior to the initiation of the procedure. The right groin was prepped and draped in the usual sterile fashion. Using a micropuncture kit and the modified Seldinger technique, access was gained to the right common femoral artery and an  8 French sheath was placed. Real-time ultrasound guidance was utilized for vascular access including the acquisition of a permanent ultrasound image documenting patency of the accessed vessel. Under fluoroscopy, a 6 [place] Berenstein 2 catheter was navigated over  a 0.035" Terumo Glidewire into the aortic arch. The catheter was placed into the right common carotid artery and then advanced into the right internal carotid artery under fluoroscopy guidance. Frontal and lateral angiograms of the head were obtained followed by magnified waters and magnified lateral views of the head, centered on the anterior communicating artery. The catheter was retracted and placed into the left carotid artery. Frontal and lateral angiograms of the neck were obtained. Under biplane roadmap the catheter advanced into the left internal carotid artery. Frontal and lateral angiograms of the head were obtained. 3D rotational angiograms were acquired and post processed in a separate workstation under concurrent attending physician supervision. Selected images were sent to PACS. Then, magnified left anterior oblique and magnified lateral views of the head were obtained. FINDINGS: 1. Patent right common femoral artery with adequate caliber for vascular access. 2. Status post treatment of an anterior communicating artery aneurysm with stent assisted coiling using "Y" stenting technique. There is complete occlusion of the aneurysm. Intracranial stent spanning the right A1 and bilateral A2 segments are patent with brisk anterograde flow. No new aneurysm the right anterior circulation identified. 3. Irregularly-shaped and multilobulated left MCA bifurcation aneurysm identified, measuring approximately 7.6 mm height, 4.2 mm transverse in the distal portion of the aneurysm and 5.5 mm transverse in the proximal aspect of the aneurysm. PROCEDURE: Under roadmap, the Berenstein 2 catheter was exchanged over the wire for a trackstar guide catheter which  was placed in the distal cervical segment of the left ICA. Magnified left anterior oblique and magnified lateral angiograms of the head were obtained in the working projections. Using biplane roadmap, a Sofia EX distal access catheter was navigated over a via 17 microcatheter and an Aristotle 14 micro guidewire into the cavernous segment of the right ICA. The microcatheter was then advanced over the microwire into the left MCA bifurcation aneurysm pouch. Then, a 6 x 4 mm web device was deployed into the aneurysm. Left internal carotid artery angiograms with magnified left anterior oblique and magnified lateral views of the head were obtained. Suboptimal coverage of the aneurysm base was observed. The device was subsequently recaptured. Next, a 7 x 4 mm web device was navigated and deployed within the aneurysm pouch. Left internal carotid artery angiograms with magnified left anterior oblique and magnified lateral views of the head were obtained. Adequate aneurysm filling by the device was noted. The device was subsequently detached. The microcatheter was retrieved. Left internal carotid artery angiograms with magnified left anterior oblique and magnified lateral views of the head were obtained. Stable position of the device noted. Left internal carotid artery angiograms with frontal and lateral views of the entire head were then obtained. No evidence of thromboembolic complication seen. Flat panel CT of the head was obtained and post processed in a separate workstation with concurrent attending physician supervision. Selected images were sent to PACS. No evidence of hemorrhagic complication. The catheter was subsequently withdrawn. A right common femoral artery angiogram was obtained in right anterior oblique view. The puncture is at the mid right common femoral artery which has normal caliber, adequate for closure device. The femoral sheath was exchanged over the wire for a Perclose pro style which was utilized for  access closure. Immediate hemostasis was achieved. IMPRESSION: 1. Successful endovascular treatment of a left MCA bifurcation aneurysm with a web device with adequate aneurysm occlusion. 2. No evidence of hemorrhagic or thromboembolic complication. PLAN: 1. Patient will continue on dual anti-platelet therapy due to previously implanted bilateral  anterior cerebral artery stents for treatment of anterior communicating artery aneurysm. 2. An MR angiogram will be obtained in 6 months to evaluate treatment outcome. Electronically Signed   By: Pedro Earls M.D.   On: 01/17/2021 15:30   IR US Guide Vasc Access Right  Result Date: 01/17/2021 INDICATION: Betty Archer is a 60 year old female with a past medical history significant for HLD, tobacco use, TIA, right ACA aneurysm s/p stent assisted embolization 08/25/20 and right ACA territory infarct 08/31/20. ACA infarct was initially felt to be related to in stent thrombosis however she reported good compliance with Brilinta 90 mg BID + ASA 81 mg QD and her p2y12 was found to be in therapeutic range. Therefore, the possibility of hypercoagulable state related to COVID-19 was raised. She made a great recovery and returns today for elective treatment of her left MCA bifurcation aneurysm. EXAM: ULTRASOUND-GUIDED VASCULAR ACCESS DIAGNOSTIC CEREBRAL ANGIOGRAM 3D ROTATIONAL ANGIOGRAM ENDOVASCULAR ANEURYSM EMBOLIZATION FLAT PANEL HEAD CT COMPARISON:  Cerebral angiogram August 25, 2020. MEDICATIONS: Ancef 2g IV. The antibiotic was administered within 1 hour of the procedure ANESTHESIA/SEDATION: The procedure was performed under general anesthesia. CONTRAST:  120 mL Omnipaque 300 mg/mL FLUOROSCOPY TIME:  Fluoroscopy Time: 34 minutes 30 seconds (2037 mGy). COMPLICATIONS: None immediate. TECHNIQUE: Informed written consent was obtained from the patient after a thorough discussion of the procedural risks, benefits and alternatives. All questions were addressed. Maximal  Sterile Barrier Technique was utilized including caps, mask, sterile gowns, sterile gloves, sterile drape, hand hygiene and skin antiseptic. A timeout was performed prior to the initiation of the procedure. The right groin was prepped and draped in the usual sterile fashion. Using a micropuncture kit and the modified Seldinger technique, access was gained to the right common femoral artery and an 8 French sheath was placed. Real-time ultrasound guidance was utilized for vascular access including the acquisition of a permanent ultrasound image documenting patency of the accessed vessel. Under fluoroscopy, a 6 [place] Berenstein 2 catheter was navigated over a 0.035" Terumo Glidewire into the aortic arch. The catheter was placed into the right common carotid artery and then advanced into the right internal carotid artery under fluoroscopy guidance. Frontal and lateral angiograms of the head were obtained followed by magnified waters and magnified lateral views of the head, centered on the anterior communicating artery. The catheter was retracted and placed into the left carotid artery. Frontal and lateral angiograms of the neck were obtained. Under biplane roadmap the catheter advanced into the left internal carotid artery. Frontal and lateral angiograms of the head were obtained. 3D rotational angiograms were acquired and post processed in a separate workstation under concurrent attending physician supervision. Selected images were sent to PACS. Then, magnified left anterior oblique and magnified lateral views of the head were obtained. FINDINGS: 1. Patent right common femoral artery with adequate caliber for vascular access. 2. Status post treatment of an anterior communicating artery aneurysm with stent assisted coiling using "Y" stenting technique. There is complete occlusion of the aneurysm. Intracranial stent spanning the right A1 and bilateral A2 segments are patent with brisk anterograde flow. No new aneurysm  the right anterior circulation identified. 3. Irregularly-shaped and multilobulated left MCA bifurcation aneurysm identified, measuring approximately 7.6 mm height, 4.2 mm transverse in the distal portion of the aneurysm and 5.5 mm transverse in the proximal aspect of the aneurysm. PROCEDURE: Under roadmap, the Berenstein 2 catheter was exchanged over the wire for a trackstar guide catheter which was placed in the distal cervical  segment of the left ICA. Magnified left anterior oblique and magnified lateral angiograms of the head were obtained in the working projections. Using biplane roadmap, a Sofia EX distal access catheter was navigated over a via 17 microcatheter and an Aristotle 14 micro guidewire into the cavernous segment of the right ICA. The microcatheter was then advanced over the microwire into the left MCA bifurcation aneurysm pouch. Then, a 6 x 4 mm web device was deployed into the aneurysm. Left internal carotid artery angiograms with magnified left anterior oblique and magnified lateral views of the head were obtained. Suboptimal coverage of the aneurysm base was observed. The device was subsequently recaptured. Next, a 7 x 4 mm web device was navigated and deployed within the aneurysm pouch. Left internal carotid artery angiograms with magnified left anterior oblique and magnified lateral views of the head were obtained. Adequate aneurysm filling by the device was noted. The device was subsequently detached. The microcatheter was retrieved. Left internal carotid artery angiograms with magnified left anterior oblique and magnified lateral views of the head were obtained. Stable position of the device noted. Left internal carotid artery angiograms with frontal and lateral views of the entire head were then obtained. No evidence of thromboembolic complication seen. Flat panel CT of the head was obtained and post processed in a separate workstation with concurrent attending physician supervision.  Selected images were sent to PACS. No evidence of hemorrhagic complication. The catheter was subsequently withdrawn. A right common femoral artery angiogram was obtained in right anterior oblique view. The puncture is at the mid right common femoral artery which has normal caliber, adequate for closure device. The femoral sheath was exchanged over the wire for a Perclose pro style which was utilized for access closure. Immediate hemostasis was achieved. IMPRESSION: 1. Successful endovascular treatment of a left MCA bifurcation aneurysm with a web device with adequate aneurysm occlusion. 2. No evidence of hemorrhagic or thromboembolic complication. PLAN: 1. Patient will continue on dual anti-platelet therapy due to previously implanted bilateral anterior cerebral artery stents for treatment of anterior communicating artery aneurysm. 2. An MR angiogram will be obtained in 6 months to evaluate treatment outcome. Electronically Signed   By: Pedro Earls M.D.   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 ACA infarct was initially felt to be related to in stent thrombosis however she reported good compliance with Brilinta 90 mg BID + ASA 81 mg QD and her p2y12 was found to be in therapeutic range. Therefore, the possibility of hypercoagulable state related to COVID-19 was raised. She made a great recovery and returns today for elective treatment of her left MCA bifurcation aneurysm. EXAM: ULTRASOUND-GUIDED VASCULAR ACCESS DIAGNOSTIC CEREBRAL ANGIOGRAM 3D ROTATIONAL ANGIOGRAM ENDOVASCULAR ANEURYSM EMBOLIZATION FLAT PANEL HEAD CT COMPARISON:  Cerebral angiogram August 25, 2020. MEDICATIONS: Ancef 2g IV. The antibiotic was  administered within 1 hour of the procedure ANESTHESIA/SEDATION: The procedure was performed under general anesthesia. CONTRAST:  120 mL Omnipaque 300 mg/mL FLUOROSCOPY TIME:  Fluoroscopy Time: 34 minutes 30 seconds (2037 mGy). COMPLICATIONS: None immediate. TECHNIQUE: Informed written consent was obtained from the patient after a thorough discussion of the procedural risks, benefits and alternatives. All questions were addressed. Maximal Sterile Barrier Technique was utilized including caps,  mask, sterile gowns, sterile gloves, sterile drape, hand hygiene and skin antiseptic. A timeout was performed prior to the initiation of the procedure. The right groin was prepped and draped in the usual sterile fashion. Using a micropuncture kit and the modified Seldinger technique, access was gained to the right common femoral artery and an 8 French sheath was placed. Real-time ultrasound guidance was utilized for vascular access including the acquisition of a permanent ultrasound image documenting patency of the accessed vessel. Under fluoroscopy, a 6 [place] Berenstein 2 catheter was navigated over a 0.035" Terumo Glidewire into the aortic arch. The catheter was placed into the right common carotid artery and then advanced into the right internal carotid artery under fluoroscopy guidance. Frontal and lateral angiograms of the head were obtained followed by magnified waters and magnified lateral views of the head, centered on the anterior communicating artery. The catheter was retracted and placed into the left carotid artery. Frontal and lateral angiograms of the neck were obtained. Under biplane roadmap the catheter advanced into the left internal carotid artery. Frontal and lateral angiograms of the head were obtained. 3D rotational angiograms were acquired and post processed in a separate workstation under concurrent attending physician supervision. Selected images were sent to PACS. Then, magnified left anterior oblique  and magnified lateral views of the head were obtained. FINDINGS: 1. Patent right common femoral artery with adequate caliber for vascular access. 2. Status post treatment of an anterior communicating artery aneurysm with stent assisted coiling using "Y" stenting technique. There is complete occlusion of the aneurysm. Intracranial stent spanning the right A1 and bilateral A2 segments are patent with brisk anterograde flow. No new aneurysm the right anterior circulation identified. 3. Irregularly-shaped and multilobulated left MCA bifurcation aneurysm identified, measuring approximately 7.6 mm height, 4.2 mm transverse in the distal portion of the aneurysm and 5.5 mm transverse in the proximal aspect of the aneurysm. PROCEDURE: Under roadmap, the Berenstein 2 catheter was exchanged over the wire for a trackstar guide catheter which was placed in the distal cervical segment of the left ICA. Magnified left anterior oblique and magnified lateral angiograms of the head were obtained in the working projections. Using biplane roadmap, a Sofia EX distal access catheter was navigated over a via 17 microcatheter and an Aristotle 14 micro guidewire into the cavernous segment of the right ICA. The microcatheter was then advanced over the microwire into the left MCA bifurcation aneurysm pouch. Then, a 6 x 4 mm web device was deployed into the aneurysm. Left internal carotid artery angiograms with magnified left anterior oblique and magnified lateral views of the head were obtained. Suboptimal coverage of the aneurysm base was observed. The device was subsequently recaptured. Next, a 7 x 4 mm web device was navigated and deployed within the aneurysm pouch. Left internal carotid artery angiograms with magnified left anterior oblique and magnified lateral views of the head were obtained. Adequate aneurysm filling by the device was noted. The device was subsequently detached. The microcatheter was retrieved. Left internal carotid  artery angiograms with magnified left anterior oblique and magnified lateral views of the head were obtained. Stable position of the device noted. Left internal carotid artery angiograms with frontal and lateral views of the entire head were then obtained. No evidence of thromboembolic complication seen. Flat panel CT of the head was obtained and post processed in a separate workstation with concurrent attending physician supervision. Selected images were sent to PACS. No evidence of hemorrhagic complication. The catheter was subsequently withdrawn. A right  common femoral artery angiogram was obtained in right anterior oblique view. The puncture is at the mid right common femoral artery which has normal caliber, adequate for closure device. The femoral sheath was exchanged over the wire for a Perclose pro style which was utilized for access closure. Immediate hemostasis was achieved. IMPRESSION: 1. Successful endovascular treatment of a left MCA bifurcation aneurysm with a web device with adequate aneurysm occlusion. 2. No evidence of hemorrhagic or thromboembolic complication. PLAN: 1. Patient will continue on dual anti-platelet therapy due to previously implanted bilateral anterior cerebral artery stents for treatment of anterior communicating artery aneurysm. 2. An MR angiogram will be obtained in 6 months to evaluate treatment outcome. Electronically Signed   By: Pedro Earls M.D.   On: 01/17/2021 15:30   IR ANGIO INTRA EXTRACRAN SEL INTERNAL CAROTID UNI L MOD SED  Result Date: 01/17/2021 INDICATION: Betty Archer is a 60 year old female with a past medical history significant for HLD, tobacco use, TIA, right ACA aneurysm s/p stent assisted embolization 08/25/20 and right ACA territory infarct 08/31/20. ACA infarct was initially felt to be related to in stent thrombosis however she reported good compliance with Brilinta 90 mg BID + ASA 81 mg QD and her p2y12 was found to be in therapeutic  range. Therefore, the possibility of hypercoagulable state related to COVID-19 was raised. She made a great recovery and returns today for elective treatment of her left MCA bifurcation aneurysm. EXAM: ULTRASOUND-GUIDED VASCULAR ACCESS DIAGNOSTIC CEREBRAL ANGIOGRAM 3D ROTATIONAL ANGIOGRAM ENDOVASCULAR ANEURYSM EMBOLIZATION FLAT PANEL HEAD CT COMPARISON:  Cerebral angiogram August 25, 2020. MEDICATIONS: Ancef 2g IV. The antibiotic was administered within 1 hour of the procedure ANESTHESIA/SEDATION: The procedure was performed under general anesthesia. CONTRAST:  120 mL Omnipaque 300 mg/mL FLUOROSCOPY TIME:  Fluoroscopy Time: 34 minutes 30 seconds (2037 mGy). COMPLICATIONS: None immediate. TECHNIQUE: Informed written consent was obtained from the patient after a thorough discussion of the procedural risks, benefits and alternatives. All questions were addressed. Maximal Sterile Barrier Technique was utilized including caps, mask, sterile gowns, sterile gloves, sterile drape, hand hygiene and skin antiseptic. A timeout was performed prior to the initiation of the procedure. The right groin was prepped and draped in the usual sterile fashion. Using a micropuncture kit and the modified Seldinger technique, access was gained to the right common femoral artery and an 8 French sheath was placed. Real-time ultrasound guidance was utilized for vascular access including the acquisition of a permanent ultrasound image documenting patency of the accessed vessel. Under fluoroscopy, a 6 [place] Berenstein 2 catheter was navigated over a 0.035" Terumo Glidewire into the aortic arch. The catheter was placed into the right common carotid artery and then advanced into the right internal carotid artery under fluoroscopy guidance. Frontal and lateral angiograms of the head were obtained followed by magnified waters and magnified lateral views of the head, centered on the anterior communicating artery. The catheter was retracted and placed  into the left carotid artery. Frontal and lateral angiograms of the neck were obtained. Under biplane roadmap the catheter advanced into the left internal carotid artery. Frontal and lateral angiograms of the head were obtained. 3D rotational angiograms were acquired and post processed in a separate workstation under concurrent attending physician supervision. Selected images were sent to PACS. Then, magnified left anterior oblique and magnified lateral views of the head were obtained. FINDINGS: 1. Patent right common femoral artery with adequate caliber for vascular access. 2. Status post treatment of an anterior communicating artery aneurysm  with stent assisted coiling using "Y" stenting technique. There is complete occlusion of the aneurysm. Intracranial stent spanning the right A1 and bilateral A2 segments are patent with brisk anterograde flow. No new aneurysm the right anterior circulation identified. 3. Irregularly-shaped and multilobulated left MCA bifurcation aneurysm identified, measuring approximately 7.6 mm height, 4.2 mm transverse in the distal portion of the aneurysm and 5.5 mm transverse in the proximal aspect of the aneurysm. PROCEDURE: Under roadmap, the Berenstein 2 catheter was exchanged over the wire for a trackstar guide catheter which was placed in the distal cervical segment of the left ICA. Magnified left anterior oblique and magnified lateral angiograms of the head were obtained in the working projections. Using biplane roadmap, a Sofia EX distal access catheter was navigated over a via 17 microcatheter and an Aristotle 14 micro guidewire into the cavernous segment of the right ICA. The microcatheter was then advanced over the microwire into the left MCA bifurcation aneurysm pouch. Then, a 6 x 4 mm web device was deployed into the aneurysm. Left internal carotid artery angiograms with magnified left anterior oblique and magnified lateral views of the head were obtained. Suboptimal coverage  of the aneurysm base was observed. The device was subsequently recaptured. Next, a 7 x 4 mm web device was navigated and deployed within the aneurysm pouch. Left internal carotid artery angiograms with magnified left anterior oblique and magnified lateral views of the head were obtained. Adequate aneurysm filling by the device was noted. The device was subsequently detached. The microcatheter was retrieved. Left internal carotid artery angiograms with magnified left anterior oblique and magnified lateral views of the head were obtained. Stable position of the device noted. Left internal carotid artery angiograms with frontal and lateral views of the entire head were then obtained. No evidence of thromboembolic complication seen. Flat panel CT of the head was obtained and post processed in a separate workstation with concurrent attending physician supervision. Selected images were sent to PACS. No evidence of hemorrhagic complication. The catheter was subsequently withdrawn. A right common femoral artery angiogram was obtained in right anterior oblique view. The puncture is at the mid right common femoral artery which has normal caliber, adequate for closure device. The femoral sheath was exchanged over the wire for a Perclose pro style which was utilized for access closure. Immediate hemostasis was achieved. IMPRESSION: 1. Successful endovascular treatment of a left MCA bifurcation aneurysm with a web device with adequate aneurysm occlusion. 2. No evidence of hemorrhagic or thromboembolic complication. PLAN: 1. Patient will continue on dual anti-platelet therapy due to previously implanted bilateral anterior cerebral artery stents for treatment of anterior communicating artery aneurysm. 2. An MR angiogram will be obtained in 6 months to evaluate treatment outcome. Electronically Signed   By: Pedro Earls M.D.   On: 01/17/2021 15:30    Treatments: IV hydration and analgesia: acetaminophen and  Percocet x 1  Discharge Exam: Blood pressure 110/69, pulse 78, temperature 98.4 F (36.9 C), temperature source Axillary, resp. rate (!) 22, height '5\' 6"'  (1.676 m), weight 198 lb (89.8 kg), SpO2 98 %. Physical Exam Vitals and nursing note reviewed.  Constitutional:      General: She is not in acute distress. HENT:     Head: Normocephalic.  Cardiovascular:     Rate and Rhythm: Normal rate.     Comments: (+) right CFA puncture site clean, dry, dressed appropriately. Soft, minimally tender, non pulsatile.  Pulmonary:     Effort: Pulmonary effort is normal.  Skin:    General: Skin is warm and dry.  Neurological:     Mental Status: She is alert and oriented to person, place, and time.    Disposition:    Allergies as of 01/18/2021   No Known Allergies      Medication List     TAKE these medications    acetaminophen 325 MG tablet Commonly known as: TYLENOL Take 2 tablets (650 mg total) by mouth every 6 (six) hours as needed for mild pain or headache.   Aspirin Low Dose 81 MG chewable tablet Generic drug: aspirin Chew 1 tablet (81 mg total) by mouth in the morning and at bedtime.   atorvastatin 80 MG tablet Commonly known as: LIPITOR Take 1 tablet (80 mg total) by mouth at bedtime.   Brilinta 90 MG Tabs tablet Generic drug: ticagrelor Take 1 tablet (90 mg total) by mouth 2 (two) times daily.   hydrocortisone 2.5 % cream Apply 1 application topically 2 (two) times daily as needed (itching).   lidocaine 5 % Commonly known as: LIDODERM Place 1 patch onto the skin daily. Apply at 8 am and remove at  pm daily. What changed:  when to take this reasons to take this   methylphenidate 10 MG tablet Commonly known as: RITALIN Take 1 tablet (10 mg total) by mouth 2 (two) times daily with breakfast and lunch.   Senexon-S 8.6-50 MG tablet Generic drug: senna-docusate Take 2 tablets by mouth at bedtime.          Electronically Signed: Joaquim Nam,  PA-C 01/18/2021, 9:19 AM   I have spent Greater Than 30 Minutes discharging Betty Archer.

## 2021-01-18 NOTE — Progress Notes (Signed)
Patient refusing to wear BP cuff due to itching. Will monitor BP with a-line.

## 2021-01-18 NOTE — Progress Notes (Signed)
PT Cancellation Note/ Discharge  Patient Details Name: Betty Archer MRN: [id number] DOB: 1960-03-19   Cancelled Treatment:    Reason Eval/Treat Not Completed: Other (comment) (MD removed PT order).    [place] L Bayyinah Dukeman 01/18/2021, 11:22 AM

## 2021-01-19 ENCOUNTER — Telehealth: Payer: Self-pay

## 2021-01-19 NOTE — Telephone Encounter (Signed)
Transition Care Management Follow-up Telephone Call Date of discharge and from where: 01/18/2021, [facility] How have you been since you were released from the hospital? She said she is feeling okay. Any questions or concerns? Yes - she said she is concerned about her hospital bills as she is uninsured.  She was not sure if she has applied for [employer] . Explained to her that this CM will have the financial counselor call her.   Items Reviewed: Did the pt receive and understand the discharge instructions provided? Yes  Medications obtained and verified? Yes  - she said she has all medications and did not have any questions about her med regime  Other? No  Any new allergies since your discharge? No  Do you have support at home? Yes   Home Care and Equipment/Supplies: Were home health services ordered? no If so, what is the name of the agency? N/a  Has the agency set up a time to come to the patient's home? not applicable Were any new equipment or medical supplies ordered?  No What is the name of the medical supply agency? N/a Were you able to get the supplies/equipment? not applicable Do you have any questions related to the use of the equipment or supplies? No  Functional Questionnaire: (I = Independent and D = Dependent) ADLs: independent. She said that she has a walker to use when needed.   Follow up appointments reviewed:  PCP Hospital f/u appt confirmed?  She said she will call when she needs to schedule an appointment with PCP   [facility] f/u appt confirmed? per AVS, IR scheduler will call her with appointment.  She has appointment with neurology- 02/02/2021.  Are transportation arrangements needed? No  If their condition worsens, is the pt aware to call PCP or go to the Emergency Dept.? Yes Was the patient provided with contact information for the PCP's office or ED? Yes - provided her with the phone number for [facility].  Was pt encouraged to call  back with questions or concerns? Yes

## 2021-01-20 ENCOUNTER — Ambulatory Visit: Payer: 59 | Admitting: Speech Pathology

## 2021-01-20 ENCOUNTER — Ambulatory Visit: Payer: 59 | Admitting: Physical Therapy

## 2021-01-25 ENCOUNTER — Ambulatory Visit: Payer: 59 | Admitting: Speech Pathology

## 2021-01-27 ENCOUNTER — Encounter: Payer: Self-pay | Admitting: Speech Pathology

## 2021-01-27 ENCOUNTER — Ambulatory Visit: Payer: 59 | Attending: Physical Medicine and Rehabilitation | Admitting: Speech Pathology

## 2021-01-27 ENCOUNTER — Other Ambulatory Visit: Payer: Self-pay

## 2021-01-27 DIAGNOSIS — R41841 Cognitive communication deficit: Secondary | ICD-10-CM | POA: Insufficient documentation

## 2021-01-27 NOTE — Telephone Encounter (Signed)
I called the Pt, she was explain that she need to call her insurance to see if she is cover 100% for the bills she has if not how much she is cover and then call the [facility] billing dept to make arrangement for payment for the balance, I think Pt understood

## 2021-01-28 NOTE — Therapy (Signed)
[place], [place], [address] Phone: [phone]   Fax:  [phone]  Speech Language Pathology Treatment  Patient Details  Name: Betty Archer MRN: [id number] Date of Birth: 30-Jun-1960 Referring Provider (SLP): Reesa Chew [place]   Encounter Date: 01/27/2021   End of Session - 01/28/21 1108     Visit Number 27    Date for SLP Re-Evaluation 02/11/21    Authorization - Visit Number 2    Authorization - Number of Visits 4    SLP Start Time 8016    SLP Stop Time  5537    SLP Time Calculation (min) 40 min    Activity Tolerance Patient tolerated treatment well             Past Medical History:  Diagnosis Date   Hyperlipidemia    Stroke [facility])     Past Surgical History:  Procedure Laterality Date   BREAST BIOPSY      Core biopsy done on April 2003   COLONOSCOPY     IR 3D INDEPENDENT WKST  08/25/2020   IR 3D INDEPENDENT WKST  08/25/2020   IR 3D INDEPENDENT WKST  01/17/2021   IR ANGIO EXTRACRAN SEL COM CAROTID INNOMINATE UNI R MOD SED  01/17/2021   IR ANGIO INTRA EXTRACRAN SEL INTERNAL CAROTID BILAT MOD SED  08/25/2020   IR ANGIO INTRA EXTRACRAN SEL INTERNAL CAROTID UNI L MOD SED  01/17/2021   IR ANGIO VERTEBRAL SEL VERTEBRAL BILAT MOD SED  08/25/2020   IR CT HEAD LTD  08/25/2020   IR CT HEAD LTD  01/17/2021   IR INTRA CRAN STENT  08/25/2020   IR TRANSCATH/EMBOLIZ  08/25/2020   IR TRANSCATH/EMBOLIZ  01/17/2021   IR US GUIDE VASC ACCESS RIGHT  08/25/2020   IR US GUIDE VASC ACCESS RIGHT  01/17/2021   PARTIAL HYSTERECTOMY   10/22/1999    Still has cervix   RADIOLOGY WITH ANESTHESIA N/A 08/25/2020   Procedure: IR WITH ANESTHESIA EMBOLIZATION;  Surgeon: Pedro Earls, MD;  Location: [place];  Service: Radiology;  Laterality: N/A;   RADIOLOGY WITH ANESTHESIA N/A 01/17/2021   Procedure: RADIOLOGY WITH ANESTHESIA  EMBOLIZATION;  Surgeon: Pedro Earls, MD;  Location: [place];  Service:  Radiology;  Laterality: N/A;   TRIGGER FINGER RELEASE Left 12/25/2013   Procedure: LEFT THUMB TRIGGER RELEASE ;  Surgeon: Leanora Cover, MD;  Location: [place];  Service: Orthopedics;  Laterality: Left;    There were no vitals filed for this visit.   Subjective Assessment - 01/27/21 1318     Subjective "How was your Thanksgiving?"    Currently in Pain? No/denies                   ADULT SLP TREATMENT - 01/28/21 1103       General Information   Behavior/Cognition Alert;Cooperative;Pleasant mood      Treatment Provided   Treatment provided Cognitive-Linquistic      Cognitive-Linquistic Treatment   Treatment focused on Cognition    Skilled Treatment SLP reassessed pt post-surgery. Pt scored an 18/30 on SLUMS (a 1-point increase from evaluation). Functionally, pt has made visible improvements with attention and initation of conversation and asking for clarification or repetition. To cont to address cognitive impairment, as patient's goal is to return to work.      Assessment / Recommendations / Plan   Plan Continue with current plan of care      Progression  Toward Goals   Progression toward goals Progressing toward goals                SLP Short Term Goals - 01/28/21 1111       SLP SHORT TERM GOAL #1   Title Pt will identify 1 alternative solution with 80% accuracy and minA.    Time 2    Period Weeks    Status On-going   Not met   Target Date 01/26/21      SLP SHORT TERM GOAL #2   Title During a structured cognitive task, pt demonstrate error awareness by identifying errors through rechecking work with [place].    Time 2    Period Weeks    Status On-going   Not met   Target Date 01/26/21              SLP Long Term Goals - 01/12/21 1137       SLP LONG TERM GOAL #1   Title Pt will identify 2 alternative solutions with 80% accuracy and minA.    Time 4    Period Weeks    Status New   not met last period   Target Date 02/09/21       SLP LONG TERM GOAL #2   Title During a structured cognitive task, pt will demonstrate error awareness by identifying errors through rechecking work with spv.    Time 4    Period Weeks    Status New   not met last period   Target Date 02/09/21      SLP LONG TERM GOAL #3   Title Pt will organize medication into pill box with minA from pt family.    Time 4    Period Weeks    Status On-going   Not met last session   Target Date 02/09/21              Plan - 01/28/21 1110     Clinical Impression Statement See tx note.  Pt demonstrates increasing awareness of deficits. When asked, pt reported she felt like her family would agree that it takes her "too long to answer a question." Cont with current POC    Speech Therapy Frequency 2x / week    Duration 4 weeks    Treatment/Interventions Functional tasks;Patient/family education;Cueing hierarchy;Environmental controls;Cognitive reorganization;Compensatory techniques;Internal/external aids;SLP instruction and feedback;Multimodal communcation approach    Potential to Achieve Goals Good    Consulted and Agree with Plan of Care Patient;Family member/caregiver    Family Member [facility] - son             Patient will benefit from skilled therapeutic intervention in order to improve the following deficits and impairments:   Cognitive communication deficit    Problem List Patient Active Problem List   Diagnosis Date Noted   Aneurysm ([place]) 01/18/2021   Acute ischemic cerebrovascular accident (CVA) involving anterior cerebral artery territory [facility]) 09/03/2020   Acute CVA (cerebrovascular accident) ([place]) 09/01/2020   Acute ischemic stroke ([place]) 08/31/2020   Aneurysm of anterior communicating artery 08/25/2020   Aneurysm, cerebral, nonruptured 08/25/2020   Microcytic anemia 10/26/2014   Vitamin D deficiency 10/17/2014   Stenosing tenosynovitis of thumb 10/17/2014   Internal hemorrhoids 10/17/2014   History of colon polyps  10/17/2014   Osteoarthritis of carpometacarpal joints of both thumbs 04/04/2014   Pre-diabetes 08/18/2013   History of TIA (transient ischemic attack) 08/12/2013   Routine health maintenance 08/15/2010   Hyperlipidemia 08/05/2008   Obesity 07/30/2007  TOBACCO DEPENDENCE 04/19/2006    [name], [place] 01/28/2021, 11:12 AM  [place] [place], [place], [address] Phone: [phone]   Fax:  [phone]   Name: NATAKI MCCRUMB MRN: [id number] Date of Birth: Feb 16, 1961

## 2021-01-31 ENCOUNTER — Other Ambulatory Visit: Payer: Self-pay

## 2021-01-31 DIAGNOSIS — I63529 Cerebral infarction due to unspecified occlusion or stenosis of unspecified anterior cerebral artery: Secondary | ICD-10-CM

## 2021-01-31 MED ORDER — METHYLPHENIDATE HCL 10 MG PO TABS
10.0000 mg | ORAL_TABLET | Freq: Two times a day (BID) | ORAL | 0 refills | Status: DC
  Filled 2021-01-31: qty 60, 30d supply, fill #0

## 2021-01-31 NOTE — Telephone Encounter (Signed)
MEDICATION  Methyllphenidate refill request:  LAST APPOINTMENT DATE: @11 /12/2020  NEXT APPOINTMENT DATE:@2 /02/2021  Pmp-  12/30/2020  Methylphenidate 10 Mg Tablet 60.00 [address] (5935) [place]

## 2021-02-01 ENCOUNTER — Ambulatory Visit: Payer: 59 | Admitting: Speech Pathology

## 2021-02-02 ENCOUNTER — Ambulatory Visit: Payer: Self-pay | Admitting: Adult Health

## 2021-02-03 ENCOUNTER — Other Ambulatory Visit: Payer: Self-pay

## 2021-02-03 ENCOUNTER — Ambulatory Visit: Payer: 59 | Admitting: Speech Pathology

## 2021-02-08 ENCOUNTER — Ambulatory Visit: Payer: 59 | Admitting: Speech Pathology

## 2021-02-08 ENCOUNTER — Encounter: Payer: Self-pay | Admitting: Speech Pathology

## 2021-02-08 ENCOUNTER — Other Ambulatory Visit: Payer: Self-pay

## 2021-02-08 ENCOUNTER — Encounter: Payer: Self-pay | Admitting: Internal Medicine

## 2021-02-08 DIAGNOSIS — R41841 Cognitive communication deficit: Secondary | ICD-10-CM

## 2021-02-08 DIAGNOSIS — I63529 Cerebral infarction due to unspecified occlusion or stenosis of unspecified anterior cerebral artery: Secondary | ICD-10-CM

## 2021-02-08 MED ORDER — METHYLPHENIDATE HCL 10 MG PO TABS: 10.0000 mg | ORAL_TABLET | Freq: Two times a day (BID) | ORAL | 0 refills | Status: DC

## 2021-02-08 NOTE — Therapy (Signed)
[place] [place], [place], [address] Phone: [phone]   Fax:  [phone]  Speech Language Pathology Treatment  Patient Details  Name: Betty Archer MRN: [id number] Date of Birth: 24-Dec-1960 Referring Provider (SLP): Reesa Chew [place]   Encounter Date: 02/08/2021   End of Session - 02/08/21 1235     Visit Number 28    Date for SLP Re-Evaluation 02/11/21    Authorization - Visit Number 3    Authorization - Number of Visits 4    SLP Start Time 3546    SLP Stop Time  5681    SLP Time Calculation (min) 42 min    Activity Tolerance Patient tolerated treatment well             Past Medical History:  Diagnosis Date   Hyperlipidemia    Stroke [facility])     Past Surgical History:  Procedure Laterality Date   BREAST BIOPSY      Core biopsy done on April 2003   COLONOSCOPY     IR 3D INDEPENDENT WKST  08/25/2020   IR 3D INDEPENDENT WKST  08/25/2020   IR 3D INDEPENDENT WKST  01/17/2021   IR ANGIO EXTRACRAN SEL COM CAROTID INNOMINATE UNI R MOD SED  01/17/2021   IR ANGIO INTRA EXTRACRAN SEL INTERNAL CAROTID BILAT MOD SED  08/25/2020   IR ANGIO INTRA EXTRACRAN SEL INTERNAL CAROTID UNI L MOD SED  01/17/2021   IR ANGIO VERTEBRAL SEL VERTEBRAL BILAT MOD SED  08/25/2020   IR CT HEAD LTD  08/25/2020   IR CT HEAD LTD  01/17/2021   IR INTRA CRAN STENT  08/25/2020   IR TRANSCATH/EMBOLIZ  08/25/2020   IR TRANSCATH/EMBOLIZ  01/17/2021   IR US GUIDE VASC ACCESS RIGHT  08/25/2020   IR US GUIDE VASC ACCESS RIGHT  01/17/2021   PARTIAL HYSTERECTOMY   10/22/1999    Still has cervix   RADIOLOGY WITH ANESTHESIA N/A 08/25/2020   Procedure: IR WITH ANESTHESIA EMBOLIZATION;  Surgeon: Pedro Earls, MD;  Location: [place];  Service: Radiology;  Laterality: N/A;   RADIOLOGY WITH ANESTHESIA N/A 01/17/2021   Procedure: RADIOLOGY WITH ANESTHESIA  EMBOLIZATION;  Surgeon: Pedro Earls, MD;  Location: [place];  Service:  Radiology;  Laterality: N/A;   TRIGGER FINGER RELEASE Left 12/25/2013   Procedure: LEFT THUMB TRIGGER RELEASE ;  Surgeon: Leanora Cover, MD;  Location: [place];  Service: Orthopedics;  Laterality: Left;    There were no vitals filed for this visit.   Subjective Assessment - 02/08/21 1235     Subjective Pt reports things are going well.    Currently in Pain? No/denies                   ADULT SLP TREATMENT - 02/08/21 1442       General Information   Behavior/Cognition Alert;Cooperative;Pleasant mood      Treatment Provided   Treatment provided Cognitive-Linquistic      Cognitive-Linquistic Treatment   Treatment focused on Cognition    Skilled Treatment SLP facilitated increased organization, alternating attention and verbal fluency this session. Pt was asked to generate a list of words that remind her of "Christmas". She required extra time and minA verbal cueing to complete. Pt demonstrates difficulty retaining information in her head. She had difficulty with alternating attention task. SLP provided pt with alphabet visual to aid recall of letter she was on which was successful.  Assessment / Recommendations / Plan   Plan Continue with current plan of care      Progression Toward Goals   Progression toward goals Progressing toward goals                SLP Short Term Goals - 02/08/21 1441       SLP SHORT TERM GOAL #1   Title Pt will identify 1 alternative solution with 80% accuracy and minA.    Time 2    Period Weeks    Status Not Met   Not met   Target Date 01/26/21      SLP SHORT TERM GOAL #2   Title During a structured cognitive task, pt demonstrate error awareness by identifying errors through rechecking work with [place].    Time 2    Period Weeks    Status Not Met   Not met   Target Date 01/26/21              SLP Long Term Goals - 01/12/21 1137       SLP LONG TERM GOAL #1   Title Pt will identify 2 alternative solutions  with 80% accuracy and minA.    Time 4    Period Weeks    Status New   not met last period   Target Date 02/09/21      SLP LONG TERM GOAL #2   Title During a structured cognitive task, pt will demonstrate error awareness by identifying errors through rechecking work with spv.    Time 4    Period Weeks    Status New   not met last period   Target Date 02/09/21      SLP LONG TERM GOAL #3   Title Pt will organize medication into pill box with minA from pt family.    Time 4    Period Weeks    Status On-going   Not met last session   Target Date 02/09/21              Plan - 02/08/21 1441     Clinical Impression Statement See tx note.  Cont with current POC    Speech Therapy Frequency 2x / week    Duration 4 weeks    Treatment/Interventions Functional tasks;Patient/family education;Cueing hierarchy;Environmental controls;Cognitive reorganization;Compensatory techniques;Internal/external aids;SLP instruction and feedback;Multimodal communcation approach    Potential to Achieve Goals Good    Consulted and Agree with Plan of Care Patient;Family member/caregiver    Family Member [employer] - son             Patient will benefit from skilled therapeutic intervention in order to improve the following deficits and impairments:   Cognitive communication deficit    Problem List Patient Active Problem List   Diagnosis Date Noted   Aneurysm ([place]) 01/18/2021   Acute ischemic cerebrovascular accident (CVA) involving anterior cerebral artery territory [facility]) 09/03/2020   Acute CVA (cerebrovascular accident) ([place]) 09/01/2020   Acute ischemic stroke ([place]) 08/31/2020   Aneurysm of anterior communicating artery 08/25/2020   Aneurysm, cerebral, nonruptured 08/25/2020   Microcytic anemia 10/26/2014   Vitamin D deficiency 10/17/2014   Stenosing tenosynovitis of thumb 10/17/2014   Internal hemorrhoids 10/17/2014   History of colon polyps 10/17/2014   Osteoarthritis of  carpometacarpal joints of both thumbs 04/04/2014   Pre-diabetes 08/18/2013   History of TIA (transient ischemic attack) 08/12/2013   Routine health maintenance 08/15/2010   Hyperlipidemia 08/05/2008   Obesity 07/30/2007   TOBACCO DEPENDENCE 04/19/2006  [name], [place] 02/08/2021, 2:46 PM  [place] [place], [place], [address] Phone: [phone]   Fax:  [phone]   Name: Betty Archer MRN: [id number] Date of Birth: 1960/06/14

## 2021-02-08 NOTE — Telephone Encounter (Signed)
Mrs. Rommel son  called of the Methylphenidate refill.  Please advise or send.  Thank you

## 2021-02-15 ENCOUNTER — Encounter: Payer: Self-pay | Admitting: *Deleted

## 2021-02-17 ENCOUNTER — Encounter: Payer: Self-pay | Admitting: Speech Pathology

## 2021-02-17 ENCOUNTER — Other Ambulatory Visit: Payer: Self-pay

## 2021-02-17 ENCOUNTER — Ambulatory Visit: Payer: 59 | Admitting: Speech Pathology

## 2021-02-17 DIAGNOSIS — R41841 Cognitive communication deficit: Secondary | ICD-10-CM | POA: Diagnosis not present

## 2021-02-18 NOTE — Addendum Note (Signed)
Addended by: Royston Sinner L on: 02/18/2021 09:50 AM   Modules accepted: Orders

## 2021-02-18 NOTE — Therapy (Signed)
[place] [place], [place], [address] Phone: [phone]   Fax:  [phone]  Speech Language Pathology Treatment & Recertification  Patient Details  Name: Betty Archer MRN: [id number] Date of Birth: 02-Jul-1960 Referring Provider (SLP): Reesa Chew [place]   Encounter Date: 02/17/2021   End of Session - 02/17/21 1605     Visit Number 29    Date for SLP Re-Evaluation 04/14/20    Authorization - Visit Number 1    Authorization - Number of Visits 3    SLP Start Time 3704    SLP Stop Time  8889    SLP Time Calculation (min) 45 min    Activity Tolerance Patient tolerated treatment well             Past Medical History:  Diagnosis Date   Brain aneurysm    Cognitive communication deficit    Hemiparesis ([place])    Hemiplegia ([place])    Hyperlipidemia    Hyperplastic colon polyp    Internal hemorrhoids    Stroke [facility])    TIA (transient ischemic attack)     Past Surgical History:  Procedure Laterality Date   BREAST BIOPSY      Core biopsy done on April 2003   COLONOSCOPY     IR 3D INDEPENDENT WKST  08/25/2020   IR 3D INDEPENDENT WKST  08/25/2020   IR 3D INDEPENDENT WKST  01/17/2021   IR ANGIO EXTRACRAN SEL COM CAROTID INNOMINATE UNI R MOD SED  01/17/2021   IR ANGIO INTRA EXTRACRAN SEL INTERNAL CAROTID BILAT MOD SED  08/25/2020   IR ANGIO INTRA EXTRACRAN SEL INTERNAL CAROTID UNI L MOD SED  01/17/2021   IR ANGIO VERTEBRAL SEL VERTEBRAL BILAT MOD SED  08/25/2020   IR CT HEAD LTD  08/25/2020   IR CT HEAD LTD  01/17/2021   IR INTRA CRAN STENT  08/25/2020   IR TRANSCATH/EMBOLIZ  08/25/2020   IR TRANSCATH/EMBOLIZ  01/17/2021   IR US GUIDE VASC ACCESS RIGHT  08/25/2020   IR US GUIDE VASC ACCESS RIGHT  01/17/2021   PARTIAL HYSTERECTOMY   10/22/1999    Still has cervix   RADIOLOGY WITH ANESTHESIA N/A 08/25/2020   Procedure: IR WITH ANESTHESIA EMBOLIZATION;  Surgeon: Pedro Earls, MD;  Location: [place];  Service:  Radiology;  Laterality: N/A;   RADIOLOGY WITH ANESTHESIA N/A 01/17/2021   Procedure: RADIOLOGY WITH ANESTHESIA  EMBOLIZATION;  Surgeon: Pedro Earls, MD;  Location: [place];  Service: Radiology;  Laterality: N/A;   TRIGGER FINGER RELEASE Left 12/25/2013   Procedure: LEFT THUMB TRIGGER RELEASE ;  Surgeon: Leanora Cover, MD;  Location: [place];  Service: Orthopedics;  Laterality: Left;    There were no vitals filed for this visit.   Subjective Assessment - 02/17/21 1604     Subjective "I don't know how to do this."    Currently in Pain? No/denies                   ADULT SLP TREATMENT - 02/18/21 0945       General Information   Behavior/Cognition Alert;Cooperative;Pleasant mood      Treatment Provided   Treatment provided Cognitive-Linquistic      Cognitive-Linquistic Treatment   Treatment focused on Cognition    Skilled Treatment SLP facilitated increased auditory comprehension of written information on financial application. Also assisted pt with reorganizing information and edu on the importance of looking at [employer], underlinded  material, and simplifying information to increase reading comprehension. Had pt read over and double check work to ensure correct information was input into form.      Assessment / Recommendations / Plan   Plan Continue with current plan of care      Progression Toward Goals   Progression toward goals Progressing toward goals                SLP Short Term Goals - 02/08/21 1441       SLP SHORT TERM GOAL #1   Title Pt will identify 1 alternative solution with 80% accuracy and minA.    Time 2    Period Weeks    Status Not Met   Not met   Target Date 01/26/21      SLP SHORT TERM GOAL #2   Title During a structured cognitive task, pt demonstrate error awareness by identifying errors through rechecking work with [place].    Time 2    Period Weeks    Status Not Met   Not met   Target Date 01/26/21               SLP Long Term Goals - 01/12/21 1137       SLP LONG TERM GOAL #1   Title Pt will identify 2 alternative solutions with 80% accuracy and minA.    Time 4    Period Weeks    Status New   not met last period   Target Date 02/09/21      SLP LONG TERM GOAL #2   Title During a structured cognitive task, pt will demonstrate error awareness by identifying errors through rechecking work with spv.    Time 4    Period Weeks    Status New   not met last period   Target Date 02/09/21      SLP LONG TERM GOAL #3   Title Pt will organize medication into pill box with minA from pt family.    Time 4    Period Weeks    Status On-going   Not met last session   Target Date 02/09/21              Plan - 02/18/21 0947     Clinical Impression Statement See tx note. To recertify pt for additional 2 visits with plans to discharge. Cont with current POC.    Speech Therapy Frequency 2x / week    Duration 4 weeks    Treatment/Interventions Functional tasks;Patient/family education;Cueing hierarchy;Environmental controls;Cognitive reorganization;Compensatory techniques;Internal/external aids;SLP instruction and feedback;Multimodal communcation approach    Potential to Achieve Goals Good    Consulted and Agree with Plan of Care Patient;Family member/caregiver    Family Member [facility] - son             Patient will benefit from skilled therapeutic intervention in order to improve the following deficits and impairments:   Cognitive communication deficit    Problem List Patient Active Problem List   Diagnosis Date Noted   Aneurysm ([place]) 01/18/2021   Acute ischemic cerebrovascular accident (CVA) involving anterior cerebral artery territory [facility]) 09/03/2020   Acute CVA (cerebrovascular accident) ([place]) 09/01/2020   Acute ischemic stroke ([place]) 08/31/2020   Aneurysm of anterior communicating artery 08/25/2020   Aneurysm, cerebral, nonruptured 08/25/2020   Microcytic  anemia 10/26/2014   Vitamin D deficiency 10/17/2014   Stenosing tenosynovitis of thumb 10/17/2014   Internal hemorrhoids 10/17/2014   History of colon polyps 10/17/2014   Osteoarthritis of carpometacarpal  joints of both thumbs 04/04/2014   Pre-diabetes 08/18/2013   History of TIA (transient ischemic attack) 08/12/2013   Routine health maintenance 08/15/2010   Hyperlipidemia 08/05/2008   Obesity 07/30/2007   TOBACCO DEPENDENCE 04/19/2006    [name], CCC-SLP 02/18/2021, 9:49 AM  [place]. [place], [place], [address] Phone: [phone]   Fax:  [phone]   Name: Betty Archer MRN: [id number] Date of Birth: [birth year]

## 2021-02-22 ENCOUNTER — Ambulatory Visit: Payer: Medicaid Other | Attending: Physical Medicine and Rehabilitation | Admitting: Speech Pathology

## 2021-02-22 ENCOUNTER — Other Ambulatory Visit: Payer: Self-pay

## 2021-02-22 ENCOUNTER — Encounter: Payer: Self-pay | Admitting: Speech Pathology

## 2021-02-22 DIAGNOSIS — R41841 Cognitive communication deficit: Secondary | ICD-10-CM | POA: Insufficient documentation

## 2021-02-22 NOTE — Therapy (Signed)
[place]. [place], [place], [address] Phone: [phone]   Fax:  [phone]  Speech Language Pathology Treatment  Patient Details  Name: Betty Archer MRN: [id number] Date of Birth: 1960/11/12 Referring Provider (SLP): Reesa Chew [place]   Encounter Date: 02/22/2021   End of Session - 02/22/21 1632     Visit Number 30    Date for SLP Re-Evaluation 04/14/20    Authorization - Visit Number 2    Authorization - Number of Visits 3    SLP Start Time 1194    SLP Stop Time  1740    SLP Time Calculation (min) 40 min    Activity Tolerance Patient tolerated treatment well             Past Medical History:  Diagnosis Date   Brain aneurysm    Cognitive communication deficit    Hemiparesis ([place])    Hemiplegia ([place])    Hyperlipidemia    Hyperplastic colon polyp    Internal hemorrhoids    Stroke [facility])    TIA (transient ischemic attack)     Past Surgical History:  Procedure Laterality Date   BREAST BIOPSY      Core biopsy done on April 2003   COLONOSCOPY     IR 3D INDEPENDENT WKST  08/25/2020   IR 3D INDEPENDENT WKST  08/25/2020   IR 3D INDEPENDENT WKST  01/17/2021   IR ANGIO EXTRACRAN SEL COM CAROTID INNOMINATE UNI R MOD SED  01/17/2021   IR ANGIO INTRA EXTRACRAN SEL INTERNAL CAROTID BILAT MOD SED  08/25/2020   IR ANGIO INTRA EXTRACRAN SEL INTERNAL CAROTID UNI L MOD SED  01/17/2021   IR ANGIO VERTEBRAL SEL VERTEBRAL BILAT MOD SED  08/25/2020   IR CT HEAD LTD  08/25/2020   IR CT HEAD LTD  01/17/2021   IR INTRA CRAN STENT  08/25/2020   IR TRANSCATH/EMBOLIZ  08/25/2020   IR TRANSCATH/EMBOLIZ  01/17/2021   IR US GUIDE VASC ACCESS RIGHT  08/25/2020   IR US GUIDE VASC ACCESS RIGHT  01/17/2021   PARTIAL HYSTERECTOMY   10/22/1999    Still has cervix   RADIOLOGY WITH ANESTHESIA N/A 08/25/2020   Procedure: IR WITH ANESTHESIA EMBOLIZATION;  Surgeon: Pedro Earls, MD;  Location: [place];  Service: Radiology;   Laterality: N/A;   RADIOLOGY WITH ANESTHESIA N/A 01/17/2021   Procedure: RADIOLOGY WITH ANESTHESIA  EMBOLIZATION;  Surgeon: Pedro Earls, MD;  Location: [place];  Service: Radiology;  Laterality: N/A;   TRIGGER FINGER RELEASE Left 12/25/2013   Procedure: LEFT THUMB TRIGGER RELEASE ;  Surgeon: Leanora Cover, MD;  Location: [place];  Service: Orthopedics;  Laterality: Left;    There were no vitals filed for this visit.   Subjective Assessment - 02/22/21 1319     Subjective "Good."    Currently in Pain? No/denies                   ADULT SLP TREATMENT - 02/22/21 1628       General Information   Behavior/Cognition Alert;Cooperative;Pleasant mood      Treatment Provided   Treatment provided Cognitive-Linquistic      Cognitive-Linquistic Treatment   Treatment focused on Cognition    Skilled Treatment SLP facilitated increased verbal expression, auditory comprehension and time management skills during schedule activity. SLP had pt generate a list of things she does daily because pt reported she doesn't get tasks done because "she doesn't  feel like it anymore". Pt reported that all she does during the day is get up, take her medicine, and sits around all day. SLP discussed sometimes with decreased initiation, it can feel tough to complete tasks. SLP assisted pt in creating a daily schedule and alarms to increase initiation. Pt required modA and prompting questions to complete all tasks.      Assessment / Recommendations / Plan   Plan Continue with current plan of care      Progression Toward Goals   Progression toward goals Progressing toward goals                SLP Short Term Goals - 02/08/21 1441       SLP SHORT TERM GOAL #1   Title Pt will identify 1 alternative solution with 80% accuracy and minA.    Time 2    Period Weeks    Status Not Met   Not met   Target Date 01/26/21      SLP SHORT TERM GOAL #2   Title During a structured  cognitive task, pt demonstrate error awareness by identifying errors through rechecking work with [place].    Time 2    Period Weeks    Status Not Met   Not met   Target Date 01/26/21              SLP Long Term Goals - 01/12/21 1137       SLP LONG TERM GOAL #1   Title Pt will identify 2 alternative solutions with 80% accuracy and minA.    Time 4    Period Weeks    Status New   not met last period   Target Date 02/09/21      SLP LONG TERM GOAL #2   Title During a structured cognitive task, pt will demonstrate error awareness by identifying errors through rechecking work with spv.    Time 4    Period Weeks    Status New   not met last period   Target Date 02/09/21      SLP LONG TERM GOAL #3   Title Pt will organize medication into pill box with minA from pt family.    Time 4    Period Weeks    Status On-going   Not met last session   Target Date 02/09/21              Plan - 02/22/21 1632     Clinical Impression Statement See tx note. To discharge next session. Cont with current POC.    Speech Therapy Frequency 2x / week    Duration 4 weeks    Treatment/Interventions Functional tasks;Patient/family education;Cueing hierarchy;Environmental controls;Cognitive reorganization;Compensatory techniques;Internal/external aids;SLP instruction and feedback;Multimodal communcation approach    Potential to Achieve Goals Good    Consulted and Agree with Plan of Care Patient;Family member/caregiver    Family Member [facility] - son             Patient will benefit from skilled therapeutic intervention in order to improve the following deficits and impairments:   Cognitive communication deficit    Problem List Patient Active Problem List   Diagnosis Date Noted   Aneurysm ([place]) 01/18/2021   Acute ischemic cerebrovascular accident (CVA) involving anterior cerebral artery territory [facility]) 09/03/2020   Acute CVA (cerebrovascular accident) ([place]) 09/01/2020   Acute  ischemic stroke ([place]) 08/31/2020   Aneurysm of anterior communicating artery 08/25/2020   Aneurysm, cerebral, nonruptured 08/25/2020   Microcytic anemia 10/26/2014  Vitamin D deficiency 10/17/2014   Stenosing tenosynovitis of thumb 10/17/2014   Internal hemorrhoids 10/17/2014   History of colon polyps 10/17/2014   Osteoarthritis of carpometacarpal joints of both thumbs 04/04/2014   Pre-diabetes 08/18/2013   History of TIA (transient ischemic attack) 08/12/2013   Routine health maintenance 08/15/2010   Hyperlipidemia 08/05/2008   Obesity 07/30/2007   TOBACCO DEPENDENCE 04/19/2006   Speech Therapy Progress Note  Dates of Reporting Period: 12/21/20 to present  Subjective Statement: "I can do things, I just don't feel like it."  Objective: See previous tx notes. Pt has had limited carryover of strategies.   Goal Update: Progressing towards some goals. See goal section.   Plan: To discharge next session due to limited carryover.  Reason Skilled Services are Required: SLP rec continued skilled services due to cognitive-communication impairment to maximize functional independence.  [name], [place] 02/22/2021, 4:33 PM  [place]. [place], [place], [address] Phone: [phone]   Fax:  [phone]   Name: Betty Archer MRN: [id number] Date of Birth: 12/30/1960

## 2021-02-24 ENCOUNTER — Encounter: Payer: Self-pay | Admitting: Speech Pathology

## 2021-02-24 ENCOUNTER — Ambulatory Visit: Payer: Medicaid Other | Admitting: Speech Pathology

## 2021-02-24 ENCOUNTER — Other Ambulatory Visit: Payer: Self-pay

## 2021-02-24 DIAGNOSIS — R41841 Cognitive communication deficit: Secondary | ICD-10-CM | POA: Diagnosis not present

## 2021-02-24 NOTE — Therapy (Signed)
[place]. [place], [place], [address] Phone: [phone]   Fax:  [phone]  Speech Language Pathology Treatment  Patient Details  Name: Betty Archer MRN: [id number] Date of Birth: November 29, 1960 Referring Provider (SLP): Reesa Chew [place]   Encounter Date: 02/24/2021   End of Session - 02/24/21 1650     Visit Number 31    Date for SLP Re-Evaluation 04/14/20    Authorization - Visit Number 3    SLP Start Time 1320    SLP Stop Time  1400    SLP Time Calculation (min) 40 min    Activity Tolerance Patient tolerated treatment well             Past Medical History:  Diagnosis Date   Brain aneurysm    Cognitive communication deficit    Hemiparesis ([place])    Hemiplegia ([place])    Hyperlipidemia    Hyperplastic colon polyp    Internal hemorrhoids    Stroke [facility])    TIA (transient ischemic attack)     Past Surgical History:  Procedure Laterality Date   BREAST BIOPSY      Core biopsy done on April 2003   COLONOSCOPY     IR 3D INDEPENDENT WKST  08/25/2020   IR 3D INDEPENDENT WKST  08/25/2020   IR 3D INDEPENDENT WKST  01/17/2021   IR ANGIO EXTRACRAN SEL COM CAROTID INNOMINATE UNI R MOD SED  01/17/2021   IR ANGIO INTRA EXTRACRAN SEL INTERNAL CAROTID BILAT MOD SED  08/25/2020   IR ANGIO INTRA EXTRACRAN SEL INTERNAL CAROTID UNI L MOD SED  01/17/2021   IR ANGIO VERTEBRAL SEL VERTEBRAL BILAT MOD SED  08/25/2020   IR CT HEAD LTD  08/25/2020   IR CT HEAD LTD  01/17/2021   IR INTRA CRAN STENT  08/25/2020   IR TRANSCATH/EMBOLIZ  08/25/2020   IR TRANSCATH/EMBOLIZ  01/17/2021   IR US GUIDE VASC ACCESS RIGHT  08/25/2020   IR US GUIDE VASC ACCESS RIGHT  01/17/2021   PARTIAL HYSTERECTOMY   10/22/1999    Still has cervix   RADIOLOGY WITH ANESTHESIA N/A 08/25/2020   Procedure: IR WITH ANESTHESIA EMBOLIZATION;  Surgeon: Pedro Earls, MD;  Location: [place];  Service: Radiology;  Laterality: N/A;   RADIOLOGY WITH ANESTHESIA  N/A 01/17/2021   Procedure: RADIOLOGY WITH ANESTHESIA  EMBOLIZATION;  Surgeon: Pedro Earls, MD;  Location: [place];  Service: Radiology;  Laterality: N/A;   TRIGGER FINGER RELEASE Left 12/25/2013   Procedure: LEFT THUMB TRIGGER RELEASE ;  Surgeon: Leanora Cover, MD;  Location: [place];  Service: Orthopedics;  Laterality: Left;    There were no vitals filed for this visit.   Subjective Assessment - 02/24/21 1649     Subjective Pt required more cueing to respond to (more complex) questions today .    Currently in Pain? No/denies                       SLP Short Term Goals - 02/24/21 1655       SLP SHORT TERM GOAL #1   Title Pt will identify 1 alternative solution with 80% accuracy and minA.    Time 2    Period Weeks    Status New   Not met   Target Date 03/10/21      SLP SHORT TERM GOAL #2   Status --   Not met  SLP Long Term Goals - 02/24/21 1654       SLP LONG TERM GOAL #1   Title Pt will verbalize 2 alternative solutions with 80% accuracy and minA.    Time 4    Period Weeks    Status Revised   not met last period     SLP LONG TERM GOAL #2   Title Pt will recall and demonstrate use of 2 auditory comprehension strategies independently.    Time 4    Period Weeks    Status New   not met last period     SLP LONG TERM GOAL #3   Status --   Not met last session             Plan - 02/24/21 1651     Clinical Impression Statement See tx note. Son attended today's session. SLP, son, and pt discussed discharge. Pt son reports that he feels she is at 90%, but would be uncomfortable with her staying at home alone and/or driving. Pt reported changed feelings regarding discharge. SLP explained to pt that she has to practice the strategies at home to help them be more effective. SLP to see 3 additional visits to review strategies and provide education. Discharged moved towards the end of the month. Family, pt and SLP in  agreement.    Speech Therapy Frequency 2x / week    Duration 4 weeks    Treatment/Interventions Functional tasks;Patient/family education;Cueing hierarchy;Environmental controls;Cognitive reorganization;Compensatory techniques;Internal/external aids;SLP instruction and feedback;Multimodal communcation approach    Potential to Achieve Goals Good    Consulted and Agree with Plan of Care Patient;Family member/caregiver    Family Member [facility] - son             Patient will benefit from skilled therapeutic intervention in order to improve the following deficits and impairments:   Cognitive communication deficit    Problem List Patient Active Problem List   Diagnosis Date Noted   Aneurysm ([place]) 01/18/2021   Acute ischemic cerebrovascular accident (CVA) involving anterior cerebral artery territory ([place]) 09/03/2020   Acute CVA (cerebrovascular accident) ([place]) 09/01/2020   Acute ischemic stroke ([place]) 08/31/2020   Aneurysm of anterior communicating artery 08/25/2020   Aneurysm, cerebral, nonruptured 08/25/2020   Microcytic anemia 10/26/2014   Vitamin D deficiency 10/17/2014   Stenosing tenosynovitis of thumb 10/17/2014   Internal hemorrhoids 10/17/2014   History of colon polyps 10/17/2014   Osteoarthritis of carpometacarpal joints of both thumbs 04/04/2014   Pre-diabetes 08/18/2013   History of TIA (transient ischemic attack) 08/12/2013   Routine health maintenance 08/15/2010   Hyperlipidemia 08/05/2008   Obesity 07/30/2007   TOBACCO DEPENDENCE 04/19/2006    [name], CCC-SLP 02/24/2021, 4:56 PM  [place]. [place], [place], [address] Phone: [phone]   Fax:  [phone]   Name: Betty Archer MRN: [id number] Date of Birth: 04-Sep-1960

## 2021-03-01 ENCOUNTER — Ambulatory Visit: Payer: Medicaid Other | Admitting: Speech Pathology

## 2021-03-01 ENCOUNTER — Other Ambulatory Visit: Payer: Self-pay

## 2021-03-01 ENCOUNTER — Encounter: Payer: Self-pay | Admitting: Speech Pathology

## 2021-03-01 DIAGNOSIS — R41841 Cognitive communication deficit: Secondary | ICD-10-CM | POA: Diagnosis not present

## 2021-03-01 NOTE — Therapy (Signed)
[place]. [place], [place], [address] Phone: [phone]   Fax:  [phone]  Speech Language Pathology Treatment  Patient Details  Name: Betty Archer MRN: [id number] Date of Birth: 1960-03-04 Referring Provider (SLP): Reesa Chew [place]   Encounter Date: 03/01/2021   End of Session - 03/01/21 1519     Visit Number 32    Date for SLP Re-Evaluation 04/14/20    Authorization - Visit Number 1    Authorization - Number of Visits 3    SLP Start Time 4818   ptl ate   SLP Stop Time  5631    SLP Time Calculation (min) 34 min    Activity Tolerance Patient tolerated treatment well             Past Medical History:  Diagnosis Date   Brain aneurysm    Cognitive communication deficit    Hemiparesis ([place])    Hemiplegia ([place])    Hyperlipidemia    Hyperplastic colon polyp    Internal hemorrhoids    Stroke [facility])    TIA (transient ischemic attack)     Past Surgical History:  Procedure Laterality Date   BREAST BIOPSY      Core biopsy done on April 2003   COLONOSCOPY     IR 3D INDEPENDENT WKST  08/25/2020   IR 3D INDEPENDENT WKST  08/25/2020   IR 3D INDEPENDENT WKST  01/17/2021   IR ANGIO EXTRACRAN SEL COM CAROTID INNOMINATE UNI R MOD SED  01/17/2021   IR ANGIO INTRA EXTRACRAN SEL INTERNAL CAROTID BILAT MOD SED  08/25/2020   IR ANGIO INTRA EXTRACRAN SEL INTERNAL CAROTID UNI L MOD SED  01/17/2021   IR ANGIO VERTEBRAL SEL VERTEBRAL BILAT MOD SED  08/25/2020   IR CT HEAD LTD  08/25/2020   IR CT HEAD LTD  01/17/2021   IR INTRA CRAN STENT  08/25/2020   IR TRANSCATH/EMBOLIZ  08/25/2020   IR TRANSCATH/EMBOLIZ  01/17/2021   IR US GUIDE VASC ACCESS RIGHT  08/25/2020   IR US GUIDE VASC ACCESS RIGHT  01/17/2021   PARTIAL HYSTERECTOMY   10/22/1999    Still has cervix   RADIOLOGY WITH ANESTHESIA N/A 08/25/2020   Procedure: IR WITH ANESTHESIA EMBOLIZATION;  Surgeon: Pedro Earls, MD;  Location: [place];  Service: Radiology;   Laterality: N/A;   RADIOLOGY WITH ANESTHESIA N/A 01/17/2021   Procedure: RADIOLOGY WITH ANESTHESIA  EMBOLIZATION;  Surgeon: Pedro Earls, MD;  Location: [place];  Service: Radiology;  Laterality: N/A;   TRIGGER FINGER RELEASE Left 12/25/2013   Procedure: LEFT THUMB TRIGGER RELEASE ;  Surgeon: Leanora Cover, MD;  Location: [place];  Service: Orthopedics;  Laterality: Left;    There were no vitals filed for this visit.   Subjective Assessment - 03/01/21 1323     Subjective "I am really struggling about my bills getting behind because I am not working."    Currently in Pain? No/denies                   ADULT SLP TREATMENT - 03/01/21 1523       General Information   Behavior/Cognition Alert;Cooperative;Pleasant mood      Treatment Provided   Treatment provided Cognitive-Linquistic      Cognitive-Linquistic Treatment   Treatment focused on Cognition    Skilled Treatment SLP facilitated increase auditory comprehension of verbal information by encouraging pt to re: 1. ask for repetition, or 2. say  she doesn't understand (to let speaker know they may need to rephrase). After the initial prompt, pt asked SLP to repeat on two different occasions and additionally asked "what does that mean?" SLP discussed that underlining and ** information can aid comprehension while reading. When SLP asked pt about her perception of self, pt reported she felt she was at a 5/10 (on being back to her "normal" self). Pt shared she feels like she is slowed down and has trouble understanding. SLP encouraged pt to use strategies to help with understanding and recall of information.      Assessment / Recommendations / Plan   Plan Continue with current plan of care      Progression Toward Goals   Progression toward goals Progressing toward goals                SLP Short Term Goals - 02/24/21 1655       SLP SHORT TERM GOAL #1   Title Pt will identify 1 alternative  solution with 80% accuracy and minA.    Time 2    Period Weeks    Status New   Not met   Target Date 03/10/21      SLP SHORT TERM GOAL #2   Status --   Not met             SLP Long Term Goals - 02/24/21 1654       SLP LONG TERM GOAL #1   Title Pt will verbalize 2 alternative solutions with 80% accuracy and minA.    Time 4    Period Weeks    Status Revised   not met last period     SLP LONG TERM GOAL #2   Title Pt will recall and demonstrate use of 2 auditory comprehension strategies independently.    Time 4    Period Weeks    Status New   not met last period     SLP LONG TERM GOAL #3   Status --   Not met last session             Plan - 03/01/21 1520     Clinical Impression Statement See tx note. Pt was more responsive today and reports continuing to feel "slowed down". Pt reports the alarms have helped her with increasing motivation to move more. Cont with current POC    Speech Therapy Frequency 2x / week    Duration 4 weeks    Treatment/Interventions Functional tasks;Patient/family education;Cueing hierarchy;Environmental controls;Cognitive reorganization;Compensatory techniques;Internal/external aids;SLP instruction and feedback;Multimodal communcation approach    Potential to Achieve Goals Good    Consulted and Agree with Plan of Care Patient;Family member/caregiver    Family Member [facility] - son             Patient will benefit from skilled therapeutic intervention in order to improve the following deficits and impairments:   Cognitive communication deficit    Problem List Patient Active Problem List   Diagnosis Date Noted   Aneurysm ([place]) 01/18/2021   Acute ischemic cerebrovascular accident (CVA) involving anterior cerebral artery territory [facility]) 09/03/2020   Acute CVA (cerebrovascular accident) ([place]) 09/01/2020   Acute ischemic stroke ([place]) 08/31/2020   Aneurysm of anterior communicating artery 08/25/2020   Aneurysm, cerebral,  nonruptured 08/25/2020   Microcytic anemia 10/26/2014   Vitamin D deficiency 10/17/2014   Stenosing tenosynovitis of thumb 10/17/2014   Internal hemorrhoids 10/17/2014   History of colon polyps 10/17/2014   Osteoarthritis of carpometacarpal joints of both  thumbs 04/04/2014   Pre-diabetes 08/18/2013   History of TIA (transient ischemic attack) 08/12/2013   Routine health maintenance 08/15/2010   Hyperlipidemia 08/05/2008   Obesity 07/30/2007   TOBACCO DEPENDENCE 04/19/2006    [name], CCC-SLP 03/01/2021, 3:27 PM  [place]. [place], [place], [address] Phone: [phone]   Fax:  [phone]   Name: Betty Archer MRN: [id number] Date of Birth: 04/27/1960

## 2021-03-08 ENCOUNTER — Other Ambulatory Visit: Payer: Self-pay

## 2021-03-08 ENCOUNTER — Ambulatory Visit: Payer: Medicaid Other | Admitting: Speech Pathology

## 2021-03-08 DIAGNOSIS — R41841 Cognitive communication deficit: Secondary | ICD-10-CM | POA: Diagnosis not present

## 2021-03-09 ENCOUNTER — Encounter: Payer: Self-pay | Admitting: Speech Pathology

## 2021-03-09 NOTE — Therapy (Signed)
[facility]. [place], [place], [address] Phone: [phone]   Fax:  [phone]  Speech Language Pathology Treatment  Patient Details  Name: Betty Archer MRN: [id number] Date of Birth: [birth year] Referring Provider (SLP): Reesa Chew [place]   Encounter Date: 03/08/2021   End of Session - 03/09/21 1413     Visit Number 33    Date for SLP Re-Evaluation 04/14/20    Authorization - Visit Number 2    Authorization - Number of Visits 3    SLP Start Time 5102    SLP Stop Time  5852    SLP Time Calculation (min) 42 min    Activity Tolerance Patient tolerated treatment well             Past Medical History:  Diagnosis Date   Brain aneurysm    Cognitive communication deficit    Hemiparesis ([place])    Hemiplegia ([place])    Hyperlipidemia    Hyperplastic colon polyp    Internal hemorrhoids    Stroke [facility])    TIA (transient ischemic attack)     Past Surgical History:  Procedure Laterality Date   BREAST BIOPSY      Core biopsy done on April 2003   COLONOSCOPY     IR 3D INDEPENDENT WKST  08/25/2020   IR 3D INDEPENDENT WKST  08/25/2020   IR 3D INDEPENDENT WKST  01/17/2021   IR ANGIO EXTRACRAN SEL COM CAROTID INNOMINATE UNI R MOD SED  01/17/2021   IR ANGIO INTRA EXTRACRAN SEL INTERNAL CAROTID BILAT MOD SED  08/25/2020   IR ANGIO INTRA EXTRACRAN SEL INTERNAL CAROTID UNI L MOD SED  01/17/2021   IR ANGIO VERTEBRAL SEL VERTEBRAL BILAT MOD SED  08/25/2020   IR CT HEAD LTD  08/25/2020   IR CT HEAD LTD  01/17/2021   IR INTRA CRAN STENT  08/25/2020   IR TRANSCATH/EMBOLIZ  08/25/2020   IR TRANSCATH/EMBOLIZ  01/17/2021   IR US GUIDE VASC ACCESS RIGHT  08/25/2020   IR US GUIDE VASC ACCESS RIGHT  01/17/2021   PARTIAL HYSTERECTOMY   10/22/1999    Still has cervix   RADIOLOGY WITH ANESTHESIA N/A 08/25/2020   Procedure: IR WITH ANESTHESIA EMBOLIZATION;  Surgeon: Pedro Earls, MD;  Location: [place];  Service: Radiology;   Laterality: N/A;   RADIOLOGY WITH ANESTHESIA N/A 01/17/2021   Procedure: RADIOLOGY WITH ANESTHESIA  EMBOLIZATION;  Surgeon: Pedro Earls, MD;  Location: [place];  Service: Radiology;  Laterality: N/A;   TRIGGER FINGER RELEASE Left 12/25/2013   Procedure: LEFT THUMB TRIGGER RELEASE ;  Surgeon: Leanora Cover, MD;  Location: [place];  Service: Orthopedics;  Laterality: Left;    There were no vitals filed for this visit.   Subjective Assessment - 03/09/21 1413     Subjective "Doing fine"    Currently in Pain? No/denies                   ADULT SLP TREATMENT - 03/09/21 1409       General Information   Behavior/Cognition Alert;Cooperative;Pleasant mood      Treatment Provided   Treatment provided Cognitive-Linquistic      Cognitive-Linquistic Treatment   Treatment focused on Cognition    Skilled Treatment Facilitated increased safety awareness by targeting problem solving in unsafe situations. Pt required minA to provide solutions to mildly complex problems. SLP showed pt the following mneumonic for remembering how to use a Data processing manager (  PASS). Pt is currently unsure if she has a Data processing manager at home. She continues to have difficulty with cognitive flexibility as indicated by providing more than one solution to a given problem. Continues to have difficulty with comprehension of verbal information. May require several repetitions before understanding given information.l      Assessment / Recommendations / Plan   Plan Continue with current plan of care      Progression Toward Goals   Progression toward goals Progressing toward goals                SLP Short Term Goals - 02/24/21 1655       SLP SHORT TERM GOAL #1   Title Pt will identify 1 alternative solution with 80% accuracy and minA.    Time 2    Period Weeks    Status New   Not met   Target Date 03/10/21      SLP SHORT TERM GOAL #2   Status --   Not met              SLP Long Term Goals - 02/24/21 1654       SLP LONG TERM GOAL #1   Title Pt will verbalize 2 alternative solutions with 80% accuracy and minA.    Time 4    Period Weeks    Status Revised   not met last period     SLP LONG TERM GOAL #2   Title Pt will recall and demonstrate use of 2 auditory comprehension strategies independently.    Time 4    Period Weeks    Status New   not met last period     SLP LONG TERM GOAL #3   Status --   Not met last session             Plan - 03/09/21 1414     Clinical Impression Statement See tx note. Pt is not using strategies at home. SLP continues to explain the importance of using strategies at home. Cont with current POC    Speech Therapy Frequency 2x / week    Duration 4 weeks    Treatment/Interventions Functional tasks;Patient/family education;Cueing hierarchy;Environmental controls;Cognitive reorganization;Compensatory techniques;Internal/external aids;SLP instruction and feedback;Multimodal communcation approach    Potential to Achieve Goals Good    Consulted and Agree with Plan of Care Patient;Family member/caregiver    Family Member [facility] - son             Patient will benefit from skilled therapeutic intervention in order to improve the following deficits and impairments:   Cognitive communication deficit    Problem List Patient Active Problem List   Diagnosis Date Noted   Aneurysm ([place]) 01/18/2021   Acute ischemic cerebrovascular accident (CVA) involving anterior cerebral artery territory ([place]) 09/03/2020   Acute CVA (cerebrovascular accident) ([place]) 09/01/2020   Acute ischemic stroke ([place]) 08/31/2020   Aneurysm of anterior communicating artery 08/25/2020   Aneurysm, cerebral, nonruptured 08/25/2020   Microcytic anemia 10/26/2014   Vitamin D deficiency 10/17/2014   Stenosing tenosynovitis of thumb 10/17/2014   Internal hemorrhoids 10/17/2014   History of colon polyps 10/17/2014   Osteoarthritis of  carpometacarpal joints of both thumbs 04/04/2014   Pre-diabetes 08/18/2013   History of TIA (transient ischemic attack) 08/12/2013   Routine health maintenance 08/15/2010   Hyperlipidemia 08/05/2008   Obesity 07/30/2007   TOBACCO DEPENDENCE 04/19/2006    [place], CCC-SLP 03/09/2021, 2:16 PM  [facility]  [address] [place], [place], [address] Phone: [phone]   Fax:  [phone]   Name: KELSIE ZABOROWSKI MRN: [id number] Date of Birth: 1960/11/03

## 2021-03-10 ENCOUNTER — Other Ambulatory Visit: Payer: Self-pay

## 2021-03-10 ENCOUNTER — Encounter: Payer: Self-pay | Admitting: Internal Medicine

## 2021-03-10 ENCOUNTER — Telehealth: Payer: Self-pay

## 2021-03-10 DIAGNOSIS — E785 Hyperlipidemia, unspecified: Secondary | ICD-10-CM

## 2021-03-10 DIAGNOSIS — I63529 Cerebral infarction due to unspecified occlusion or stenosis of unspecified anterior cerebral artery: Secondary | ICD-10-CM

## 2021-03-10 MED ORDER — METHYLPHENIDATE HCL 10 MG PO TABS
10.0000 mg | ORAL_TABLET | Freq: Two times a day (BID) | ORAL | 0 refills | Status: DC
  Filled 2021-03-10: qty 60, 30d supply, fill #0

## 2021-03-10 MED ORDER — ATORVASTATIN CALCIUM 80 MG PO TABS
80.0000 mg | ORAL_TABLET | Freq: Every day | ORAL | 4 refills | Status: DC
  Filled 2021-03-10: qty 30, 30d supply, fill #0
  Filled 2021-04-08: qty 30, 30d supply, fill #1
  Filled 2021-04-29 – 2021-05-12 (×3): qty 30, 30d supply, fill #2
  Filled 2021-06-03 – 2021-06-06 (×2): qty 30, 30d supply, fill #3
  Filled 2021-07-28: qty 30, 30d supply, fill #4
  Filled 2021-12-13: qty 30, 30d supply, fill #5

## 2021-03-10 MED ORDER — TICAGRELOR 90 MG PO TABS
90.0000 mg | ORAL_TABLET | Freq: Two times a day (BID) | ORAL | 4 refills | Status: DC
  Filled 2021-03-10: qty 60, 30d supply, fill #0
  Filled 2021-04-08: qty 60, 30d supply, fill #1
  Filled 2021-04-29 – 2021-05-12 (×3): qty 60, 30d supply, fill #2
  Filled 2021-06-03 – 2021-06-06 (×2): qty 60, 30d supply, fill #3
  Filled 2021-07-28: qty 60, 30d supply, fill #4

## 2021-03-10 NOTE — Telephone Encounter (Signed)
Meds refilled. A second refill of ritalin for February was written.     What about disability? I'm not in charge of processing that.

## 2021-03-10 NOTE — Telephone Encounter (Addendum)
Patient's son is calling for refills on Atorvastatin, Ritalin and Brilinta. Per PMP, last fill for Ritalin was 02/08/22. He also wants to know about her disability.

## 2021-03-10 NOTE — Addendum Note (Signed)
Addended by: Alger Simons T on: 03/10/2021 11:26 AM   Modules accepted: Orders

## 2021-03-11 ENCOUNTER — Other Ambulatory Visit: Payer: Self-pay

## 2021-03-11 MED ORDER — METHYLPHENIDATE HCL 10 MG PO TABS: 10.0000 mg | ORAL_TABLET | Freq: Two times a day (BID) | ORAL | 0 refills | Status: DC

## 2021-03-11 NOTE — Telephone Encounter (Signed)
Informed patient's son of refills sent to pharmacy. He stated the Ritalin has to go to [facility] on [address] because [facility] do not dispense controlled substances.

## 2021-03-11 NOTE — Addendum Note (Signed)
Addended by: Alger Simons T on: 03/11/2021 11:29 AM   Modules accepted: Orders

## 2021-03-11 NOTE — Telephone Encounter (Signed)
Rx's sent to [facility]

## 2021-03-15 ENCOUNTER — Encounter: Payer: Self-pay | Admitting: Speech Pathology

## 2021-03-15 ENCOUNTER — Other Ambulatory Visit: Payer: Self-pay

## 2021-03-15 ENCOUNTER — Ambulatory Visit: Payer: Medicaid Other | Admitting: Speech Pathology

## 2021-03-15 DIAGNOSIS — R41841 Cognitive communication deficit: Secondary | ICD-10-CM

## 2021-03-16 ENCOUNTER — Encounter: Payer: Self-pay | Admitting: Speech Pathology

## 2021-03-16 NOTE — Therapy (Signed)
[facility]. [place], [place], [address] Phone: [phone]   Fax:  [phone]  Speech Language Pathology Treatment & Discharge Summary  Patient Details  Name: Betty Archer MRN: [id number] Date of Birth: 10-23-1960 Referring Provider (SLP): Reesa Chew [place]   Encounter Date: 03/15/2021   End of Session - 03/16/21 1249     Visit Number 34    Date for SLP Re-Evaluation 04/14/20    Authorization - Visit Number 3    Authorization - Number of Visits 3    SLP Start Time 1320    SLP Stop Time  1400    SLP Time Calculation (min) 40 min    Activity Tolerance Patient tolerated treatment well             Past Medical History:  Diagnosis Date   Brain aneurysm    Cognitive communication deficit    Hemiparesis ([place])    Hemiplegia ([place])    Hyperlipidemia    Hyperplastic colon polyp    Internal hemorrhoids    Stroke [facility])    TIA (transient ischemic attack)     Past Surgical History:  Procedure Laterality Date   BREAST BIOPSY      Core biopsy done on April 2003   COLONOSCOPY     IR 3D INDEPENDENT WKST  08/25/2020   IR 3D INDEPENDENT WKST  08/25/2020   IR 3D INDEPENDENT WKST  01/17/2021   IR ANGIO EXTRACRAN SEL COM CAROTID INNOMINATE UNI R MOD SED  01/17/2021   IR ANGIO INTRA EXTRACRAN SEL INTERNAL CAROTID BILAT MOD SED  08/25/2020   IR ANGIO INTRA EXTRACRAN SEL INTERNAL CAROTID UNI L MOD SED  01/17/2021   IR ANGIO VERTEBRAL SEL VERTEBRAL BILAT MOD SED  08/25/2020   IR CT HEAD LTD  08/25/2020   IR CT HEAD LTD  01/17/2021   IR INTRA CRAN STENT  08/25/2020   IR TRANSCATH/EMBOLIZ  08/25/2020   IR TRANSCATH/EMBOLIZ  01/17/2021   IR US GUIDE VASC ACCESS RIGHT  08/25/2020   IR US GUIDE VASC ACCESS RIGHT  01/17/2021   PARTIAL HYSTERECTOMY   10/22/1999    Still has cervix   RADIOLOGY WITH ANESTHESIA N/A 08/25/2020   Procedure: IR WITH ANESTHESIA EMBOLIZATION;  Surgeon: Pedro Earls, MD;  Location: [place] [facility];  Service:  Radiology;  Laterality: N/A;   RADIOLOGY WITH ANESTHESIA N/A 01/17/2021   Procedure: RADIOLOGY WITH ANESTHESIA  EMBOLIZATION;  Surgeon: Pedro Earls, MD;  Location: [place];  Service: Radiology;  Laterality: N/A;   TRIGGER FINGER RELEASE Left 12/25/2013   Procedure: LEFT THUMB TRIGGER RELEASE ;  Surgeon: Leanora Cover, MD;  Location: [place];  Service: Orthopedics;  Laterality: Left;    There were no vitals filed for this visit.   Subjective Assessment - 03/15/21 1317     Subjective "Trying to get my bills together, but it's hard because I don't have any money."    Currently in Pain? No/denies                   ADULT SLP TREATMENT - 03/16/21 1243       General Information   Behavior/Cognition Alert;Cooperative;Pleasant mood      Treatment Provided   Treatment provided Cognitive-Linquistic      Cognitive-Linquistic Treatment   Treatment focused on Cognition    Skilled Treatment Edu and discussed discharge this session. Reassessed informally using [place]. Pt scored a 16/30. Reportedly, pt has not been  completing strategies at home and watches television most of the day. Subjectively, pt has made improvements in terms of response time during conversation. She uses auditory comprehension strategies (asking for repetition) independently. Pt in agreement with discharge at this time.      Assessment / Recommendations / Plan   Plan Discharge SLP treatment due to (comment);Other (Comment)   Pt reports she is not doing strategies at home.     Progression Toward Goals   Progression toward goals Not progressing toward goals (comment)   Lack of follow through at home with strategies. Has made some improvement with items practiced in therapy.               SLP Short Term Goals - 03/16/21 1250       SLP SHORT TERM GOAL #1   Title Pt will identify 1 alternative solution with 80% accuracy and minA.    Time 2    Period Weeks    Status Not Met   Not  met   Target Date 03/10/21      SLP SHORT TERM GOAL #2   Status --   Not met             SLP Long Term Goals - 03/16/21 1251       SLP LONG TERM GOAL #1   Title Pt will verbalize 2 alternative solutions with 80% accuracy and minA.    Time 4    Period Weeks    Status Not Met   not met last period     SLP LONG TERM GOAL #2   Title Pt will recall and demonstrate use of 2 auditory comprehension strategies independently.    Time 4    Period Weeks    Status Achieved   not met last period     SLP LONG TERM GOAL #3   Status --   Not met last session             Plan - 03/16/21 1250     Clinical Impression Statement See tx note. Pt is not using strategies at home. SLP to discharge this session. Pt in agreement. SLP provided pt with discharge packet to prompt strategy use at home.    Speech Therapy Frequency 2x / week    Duration 4 weeks    Treatment/Interventions Functional tasks;Patient/family education;Cueing hierarchy;Environmental controls;Cognitive reorganization;Compensatory techniques;Internal/external aids;SLP instruction and feedback;Multimodal communcation approach    Potential to Achieve Goals Good    Consulted and Agree with Plan of Care Patient;Family member/caregiver    Family Member [facility] - son             Patient will benefit from skilled therapeutic intervention in order to improve the following deficits and impairments:   Cognitive communication deficit    Problem List Patient Active Problem List   Diagnosis Date Noted   Aneurysm ([place]) 01/18/2021   Acute ischemic cerebrovascular accident (CVA) involving anterior cerebral artery territory ([place]) 09/03/2020   Acute CVA (cerebrovascular accident) ([place]) 09/01/2020   Acute ischemic stroke ([place]) 08/31/2020   Aneurysm of anterior communicating artery 08/25/2020   Aneurysm, cerebral, nonruptured 08/25/2020   Microcytic anemia 10/26/2014   Vitamin D deficiency 10/17/2014   Stenosing  tenosynovitis of thumb 10/17/2014   Internal hemorrhoids 10/17/2014   History of colon polyps 10/17/2014   Osteoarthritis of carpometacarpal joints of both thumbs 04/04/2014   Pre-diabetes 08/18/2013   History of TIA (transient ischemic attack) 08/12/2013   Routine health maintenance 08/15/2010   Hyperlipidemia 08/05/2008  Obesity 07/30/2007   TOBACCO DEPENDENCE 04/19/2006   SPEECH THERAPY DISCHARGE SUMMARY  Visits from Start of Care: 34  Current functional level related to goals / functional outcomes: 16/30 latest MOCA   Remaining deficits: Memory, attention, problem solving, cognitive flexibility, slow processing.   Education / Equipment: Provided education.    Patient agrees to discharge. Patient goals were partially met. Patient is being discharged due to lack of progress. 2/2 lack of carryover of given strategies.      [name], [place] 03/16/2021, 12:51 PM  [facility]. [place], [place], [address] Phone: [phone]   Fax:  [phone]   Name: Betty Archer MRN: [id number] Date of Birth: 05/05/1960

## 2021-03-22 ENCOUNTER — Telehealth: Payer: Self-pay | Admitting: Internal Medicine

## 2021-03-22 ENCOUNTER — Encounter: Payer: Self-pay | Admitting: *Deleted

## 2021-03-22 ENCOUNTER — Ambulatory Visit: Payer: Medicaid Other | Admitting: Internal Medicine

## 2021-03-22 NOTE — Telephone Encounter (Signed)
Good Morning Dr. Hilarie Fredrickson,  Patients son called and stated that he was going to cancel her appointment with you today at 10:30. Son stated that his mother has had 2 brain aneurysms and has a hard time remembering things. patient thought that the appointment was to get a new PCP.   Son stated that at this time she is not ready to schedule for a Colonoscopy and will call back at a later time when she is ready.

## 2021-03-23 ENCOUNTER — Encounter: Payer: Medicaid Other | Attending: Registered Nurse | Admitting: Physical Medicine & Rehabilitation

## 2021-03-23 ENCOUNTER — Encounter: Payer: Self-pay | Admitting: Physical Medicine & Rehabilitation

## 2021-03-23 ENCOUNTER — Other Ambulatory Visit: Payer: Self-pay

## 2021-03-23 DIAGNOSIS — F329 Major depressive disorder, single episode, unspecified: Secondary | ICD-10-CM | POA: Diagnosis present

## 2021-03-23 DIAGNOSIS — I63529 Cerebral infarction due to unspecified occlusion or stenosis of unspecified anterior cerebral artery: Secondary | ICD-10-CM | POA: Insufficient documentation

## 2021-03-23 MED ORDER — METHYLPHENIDATE HCL 10 MG PO TABS: 10.0000 mg | ORAL_TABLET | Freq: Two times a day (BID) | ORAL | 0 refills | Status: DC

## 2021-03-23 MED ORDER — BUPROPION HCL ER (XL) 150 MG PO TB24: 150.0000 mg | ORAL_TABLET | Freq: Every day | ORAL | 3 refills | Status: DC

## 2021-03-23 NOTE — Patient Instructions (Signed)
PLEASE FEEL FREE TO CALL OUR OFFICE WITH ANY PROBLEMS OR QUESTIONS ([phone])      

## 2021-03-23 NOTE — Progress Notes (Signed)
Subjective:    Patient ID: Betty Archer, female    DOB: 13-Oct-1960, 61 y.o.   MRN: [id number]  HPI  This is a follow-up visit for Betty Archer who is here for functional deficits secondary to a large right ACA infarct and smaller left ACA infarct related to a comm aneurysm which required coiling and stenting.  I last saw her in November and she had made some progress but really was lacking awareness and insight and still was struggling from a initiation standpoint.  We backed off her Ritalin slightly.  Patient is able to dress herself and and bathe her self but really is doing little else.  She states that she sits around the house a lot.  Her sons are at home with her providing supervision.  Sons cook meals and give her her medications.  I asked her if she exercises or do anything and she really did not offer of being very much.  She did state that she feels depressed and feels that way most every day.  In talking to her son he has some struggles in approaching her as she is the "mother figure" he does not want to overstep his bounds.   Patient reports right shoulder pain but her left hip pain seems much improved.  She is taking all of her medications as prescribed except occasionally she may miss a midday dose of her Ritalin when her son is not there to give it to her.  Pain Inventory Average Pain 8 Pain Right Now 0 My pain is intermittent and aching  LOCATION OF PAIN  left leg  BOWEL Number of stools per week: 5 Oral laxative use Yes  Type of laxative stool softener   BLADDER Normal and Pads In and out cath, frequency n/a Able to self cath  Bladder incontinence Yes  Frequent urination No  Leakage with coughing No  Difficulty starting stream No  Incomplete bladder emptying No    Mobility walk without assistance walk with assistance how many minutes can you walk? unknown ability to climb steps?  yes do you drive?  no Do you have any goals in this area?   yes  Function what is your job? On medical leave from [employer]  disabled: date disabled applied I need assistance with the following:  dressing, meal prep, household duties, and shopping Do you have any goals in this area?  yes  Neuro/Psych bladder control problems weakness numbness trouble walking confusion depression anxiety  Prior Studies Any changes since last visit?  no  Physicians involved in your care Any changes since last visit?  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    Employer: [employer]  Tobacco Use   Smoking status: Some Days    Packs/day: 0.50    Years: 27.00    Pack years: 13.50    Types: Cigarettes   Smokeless tobacco: Never  Tobacco comments:    11/02/20 one a day  Vaping Use   Vaping Use: Never used  Substance and Sexual Activity   Alcohol use: Yes    Alcohol/week: 0.0 standard drinks    Comment:  patient is a social drinker   Drug use: No   Sexual activity: Not on file  Other Topics Concern   Not on file  Social History Narrative    She has 3 son who are healthy ,    11/02/20 lives with son, Betty Archer    worked as a Research officer, 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   Social Determinants of Health   Financial Resource Strain: Not on file  Food Insecurity: Not on file  Transportation Needs: Not on file  Physical Activity: Not on file  Stress: Not on file  Social Connections: Not on file   Past Surgical History:  Procedure Laterality Date   BREAST BIOPSY      Core biopsy  done on April 2003   COLONOSCOPY     IR 3D INDEPENDENT WKST  08/25/2020   IR 3D INDEPENDENT WKST  08/25/2020   IR 3D INDEPENDENT WKST  01/17/2021   IR ANGIO EXTRACRAN SEL COM CAROTID INNOMINATE UNI R MOD SED  01/17/2021   IR ANGIO INTRA EXTRACRAN SEL INTERNAL CAROTID BILAT MOD SED  08/25/2020   IR ANGIO INTRA EXTRACRAN SEL INTERNAL CAROTID UNI L MOD SED  01/17/2021   IR ANGIO VERTEBRAL SEL VERTEBRAL BILAT MOD SED  08/25/2020   IR CT HEAD LTD  08/25/2020   IR CT HEAD LTD  01/17/2021   IR INTRA CRAN STENT  08/25/2020   IR TRANSCATH/EMBOLIZ  08/25/2020   IR TRANSCATH/EMBOLIZ  01/17/2021   IR US GUIDE VASC ACCESS RIGHT  08/25/2020   IR US GUIDE VASC ACCESS RIGHT  01/17/2021   PARTIAL HYSTERECTOMY   10/22/1999    Still has cervix   RADIOLOGY WITH ANESTHESIA N/A 08/25/2020   Procedure: IR WITH ANESTHESIA EMBOLIZATION;  Surgeon: Pedro Earls, MD;  Location: [place] [facility];  Service: Radiology;  Laterality: N/A;   RADIOLOGY WITH ANESTHESIA N/A 01/17/2021   Procedure: RADIOLOGY WITH ANESTHESIA  EMBOLIZATION;  Surgeon: Pedro Earls, MD;  Location: [place];  Service: Radiology;  Laterality: N/A;   TRIGGER FINGER RELEASE Left 12/25/2013   Procedure: LEFT THUMB TRIGGER RELEASE ;  Surgeon: Leanora Cover, MD;  Location: [place];  Service: Orthopedics;  Laterality: Left;   Past Medical History:  Diagnosis Date   Brain aneurysm    Cerebral aneurysm    Cognitive communication deficit    Hemiparesis ([place])    Hemiplegia ([place])    Hyperlipidemia    Hyperplastic colon polyp    Internal hemorrhoids    Stroke ([place])    TIA (transient ischemic attack)    There were no vitals taken for this visit.  Opioid Risk Score:   Fall Risk Score:  `1  Depression screen PHQ 2/9  Depression screen [facility] 2/9 12/29/2020 12/01/2020 11/02/2020 10/26/2020 10/13/2020 10/16/2014 04/02/2014  Decreased Interest 0 0 0 0 0 1 0  Down, Depressed, Hopeless 0 0 0 0 0 1 0  PHQ - 2 Score 0 0 0 0 0 2 0  Altered  sleeping - 0 - 3 0 3 -  Tired, decreased energy - 1 - 3 0 0 -  Change in appetite - 0 - 0 0 0 -  Feeling bad or failure about yourself  - 0 - 0 0 3 -  Trouble concentrating - 2 - 0 0 0 -  Moving slowly or fidgety/restless - 2 - 2 2 0 -  Suicidal thoughts - 0 - 0 0 0 -  PHQ-9 Score - 5 - 8 2 8  -  Difficult doing work/chores - - - Not difficult at all - Not difficult at all -    Review of Systems  Musculoskeletal:  Positive for back pain.       Left leg pain, right shoulder pain  Neurological:  Positive for weakness and numbness.  Psychiatric/Behavioral:  Positive for confusion.        Depression, Anxiety      Objective:   Physical Exam  Gen: no distress, normal appearing HEENT: oral mucosa pink and moist, NCAT Cardio: Reg rate Chest: normal effort, normal rate of breathing Abd: soft, non-distended Ext: no edema Psych: pleasant, normal affect Skin: intact Neuro: Patient remains alert and oriented to place person month and year.  Has poor insight and awareness.  She really does not initiate lunch and is very delayed in her processing.[place] LUE 4+/5. LLE 4+/5. Mild PD is still present, neg romberg.  Musculoskeletal: She has no left hip pain today.  Right shoulder was ranged in all planes even with resistance and I was unable to elicit any pain responses today.           Assessment & Plan:  Medical Problem List and Plan: 1.  Altered mental status with decreased functional mobility/left lower extremity weakness secondary to large right ACA and small left ACA infarct likely related to recent ACOM aneurysm post coiling and stenting             -Discussed patient's ongoing cognitive deficits.  He is likely will have a long-term components although her mood is affecting her as well.   -Encouraged her son to try to push her into more social and leisure activities.  I would also like to see her exercising more.   -Continue Ritalin for attention and initiation.  This was refilled today -No  driving 2.  stroke prophylaxis             -antiplatelet therapy: Brilinta 90 mg twice daily, aspirin 81 mg daily  -I reviewed her med list and she needed no refills of Brilinta today 3. Reactive depression:  -Begin trial of Wellbutrin XL 150 mg daily.  This may help somewhat with her energy levels and initiation..  Asked the son to follow-up with me if he is not seeing improvement over the next few weeks time    4.  Tobacco use.  Tobacco cessation has been discussed     30 minutes of face to face patient care time were spent during this visit. All questions were encouraged and answered.  Follow up with me in 2 months.

## 2021-03-24 ENCOUNTER — Ambulatory Visit: Payer: Self-pay | Admitting: Adult Health

## 2021-03-25 ENCOUNTER — Ambulatory Visit: Payer: 59 | Admitting: Internal Medicine

## 2021-04-08 ENCOUNTER — Other Ambulatory Visit: Payer: Self-pay

## 2021-04-26 ENCOUNTER — Ambulatory Visit: Payer: Medicaid Other | Admitting: Adult Health

## 2021-04-26 ENCOUNTER — Encounter: Payer: Self-pay | Admitting: Adult Health

## 2021-04-26 VITALS — BP 130/77 | HR 107 | Ht 66.0 in | Wt 189.0 lb

## 2021-04-26 DIAGNOSIS — I69319 Unspecified symptoms and signs involving cognitive functions following cerebral infarction: Secondary | ICD-10-CM

## 2021-04-26 DIAGNOSIS — I63423 Cerebral infarction due to embolism of bilateral anterior cerebral arteries: Secondary | ICD-10-CM

## 2021-04-26 NOTE — Progress Notes (Signed)
[facility] [address] [place]. [place] [address] [phone]       STROKE FOLLOW UP NOTE  [name] Date of Birth:  1960-12-31 Medical Record Number:  [id number]   Reason for Referral: stroke follow up    SUBJECTIVE:   CHIEF COMPLAINT:  Chief Complaint  Patient presents with   Follow-up    RM 3 with son Betty Archer Pt is well, states things are ok     HPI:   Update 04/26/2021 JM: Patient returns for 65-monthstroke follow-up.  Overall stable without new stroke/TIA symptoms.  Reports residual cognitive impairment and left sided weakness with improvement noted since prior visit. She lives with her 2 of her sons who assist with majority of IADLs and provide supervision.  Able to maintain ADLs independently. Admits to limited physical activity or routine exercise.  Discharged from SLP due to limited progression and lack of follow-through with exercises at home, admits to not doing any mental activities or exercises at home.  She did undergo cerebral angiogram with embolization of a left MCA aneurysm on 106/23/7628without complication. She has not yet had f/u with IR.  She has remained on aspirin and Brilinta without side effects as well as atorvastatin without side effects.  Blood pressure today 130/77. No further concerns at this time.     History provided for reference purposes only Initial visit 11/02/2020 JM: Ms. AVandevenderis being seen for hospital follow-up accompanied by her son, CMoapa Archer  Overall doing well.  [facility] residual [facility]-sided weakness, gait impairment and cognitive impairment. Her sons continue to provide 24/7 supervision.  She has been gradually returning back to maintaining ADLs independently but still need some assistance or supervision.  She has not yet returned back to any IADLs.  Her son was living with her previously but she was completely independent.  Son is requesting FMLA  paperwork to be completed.  Denies new stroke/TIA symptoms.  Remains on aspirin and Brilinta as well as atorvastatin.  Blood pressure today 121/76.  Continued tobacco use but able to decrease daily amt to currently 1 cigarette/day with goals of complete cessation soon.  Currently awaiting to undergo procedure of left MCA aneurysm with Dr. RNorma Fredrickson-not yet scheduled.  No further concerns at this time.  Stroke admission 08/31/2020 Ms. Betty TWITTYis a 61y.o. female with history of HLD, cerebral aneurysms including recent embolization and coiling of the R ACA aneurysm on 08/25/20 and untreated left MCA bifurcation 8 mm aneurysm who returned on 08/31/2020 with bilateral L>R lower extremity weakness.  MRI showed R ACA territory and a small L ACA territory infarct.  Per Dr. XErlinda Hong etiology potentially be an embolic/thrombic infarct of the R ACA secondary to recent aneurysm repair.  CTA head/neck showed ACOM aneurysm s/p coiling and untreated left MCA bifurcation aneurysm.  EF 65 to 70%.  LDL 131.  A1c 5.3.  UDS negative.  Recommend continuation of aspirin and Brilinta at discharge as well as initiating atorvastatin 80 mg daily.  Other stroke risk factors include current tobacco use.  Residual deficits of psychomotor slowing, and L>R LE weakness as well as postop depression with initiating Prozac.  Therapies recommended CIR     PERTINENT IMAGING  MR BRAIN 09/01/2020 IMPRESSION: 1. Confluent Acute Right ACA territory infarct. This includes involvement of the anterior basal ganglia (medial striate artery territory). Ischemia also in the Left ACA territory but limited to cortex of the cingulate  gyrus. 2. Cytotoxic edema but no associated hemorrhage or significant mass effect.  CTA HEAD/NECK 08/31/2020 IMPRESSION: 1. No emergent large vessel occlusion or high-grade stenosis. 2. 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 3. Coil mass at the anterior communicating artery without visible residual aneurysm  filling. 4. Aberrant right subclavian artery.        ROS:   14 system review of systems performed and negative with exception of those listed in HPI  PMH:  Past Medical History:  Diagnosis Date   Brain aneurysm    Cerebral aneurysm    Cognitive communication deficit    Hemiparesis ([place])    Hemiplegia ([place])    Hyperlipidemia    Hyperplastic colon polyp    Internal hemorrhoids    Stroke [facility])    TIA (transient ischemic attack)     PSH:  Past Surgical History:  Procedure Laterality Date   BREAST BIOPSY      Core biopsy done on April 2003   COLONOSCOPY     IR 3D INDEPENDENT WKST  08/25/2020   IR 3D INDEPENDENT WKST  08/25/2020   IR 3D INDEPENDENT WKST  01/17/2021   IR ANGIO EXTRACRAN SEL COM CAROTID INNOMINATE UNI R MOD SED  01/17/2021   IR ANGIO INTRA EXTRACRAN SEL INTERNAL CAROTID BILAT MOD SED  08/25/2020   IR ANGIO INTRA EXTRACRAN SEL INTERNAL CAROTID UNI L MOD SED  01/17/2021   IR ANGIO VERTEBRAL SEL VERTEBRAL BILAT MOD SED  08/25/2020   IR CT HEAD LTD  08/25/2020   IR CT HEAD LTD  01/17/2021   IR INTRA CRAN STENT  08/25/2020   IR TRANSCATH/EMBOLIZ  08/25/2020   IR TRANSCATH/EMBOLIZ  01/17/2021   IR US GUIDE VASC ACCESS RIGHT  08/25/2020   IR US GUIDE VASC ACCESS RIGHT  01/17/2021   PARTIAL HYSTERECTOMY   10/22/1999    Still has cervix   RADIOLOGY WITH ANESTHESIA N/A 08/25/2020   Procedure: IR WITH ANESTHESIA EMBOLIZATION;  Surgeon: Pedro Earls, MD;  Location: [place];  Service: Radiology;  Laterality: N/A;   RADIOLOGY WITH ANESTHESIA N/A 01/17/2021   Procedure: RADIOLOGY WITH ANESTHESIA  EMBOLIZATION;  Surgeon: Pedro Earls, MD;  Location: [place];  Service: Radiology;  Laterality: N/A;   TRIGGER FINGER RELEASE Left 12/25/2013   Procedure: LEFT THUMB TRIGGER RELEASE ;  Surgeon: Leanora Cover, MD;  Location: [place];  Service: Orthopedics;  Laterality: Left;    Social History:  Social History   Socioeconomic History   Marital  status: Single    Spouse name: Not on file   Number of children: 3   Years of education: Not on file   Highest education level: Not on file  Occupational History   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    Employer: [employer]  Tobacco Use   Smoking status: Some Days    Packs/day: 0.50    Years: 27.00    Pack years: 13.50    Types: Cigarettes   Smokeless tobacco: Never   Tobacco comments:    11/02/20 one a day  Vaping Use   Vaping Use: Never used  Substance and Sexual Activity   Alcohol use: Yes    Alcohol/week: 0.0 standard drinks    Comment:  patient is a social drinker   Drug use: No   Sexual activity: Not on file  Other Topics Concern   Not on file  Social History Narrative    She has 3 son who are healthy ,  11/02/20 lives with son, [name]    worked as a Research officer, 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   Social Determinants of Health   Financial Resource Strain: Not on file  Food Insecurity: Not on file  Transportation Needs: Not on file  Physical Activity: Not on file  Stress: Not on file  Social Connections: Not on file  Intimate Partner Violence: Not on file    Family History:  Family History  Problem Relation Age of Onset   Hypertension Mother    Lung cancer Father    Breast cancer Maternal Grandmother    Breast cancer Maternal Aunt    Colon cancer Neg Hx    Esophageal cancer Neg Hx    Stomach cancer Neg Hx     Medications:   Current Outpatient Medications on File Prior to Visit  Medication Sig Dispense Refill   acetaminophen (TYLENOL) 325 MG tablet Take 2 tablets (650 mg total) by mouth every 6 (six) hours as needed for mild pain or headache. 100 tablet 0   aspirin 81 MG chewable tablet Chew 1 tablet (81 mg total) by mouth in the morning and at bedtime. 100 tablet 0   atorvastatin (LIPITOR) 80 MG tablet Take 1 tablet (80 mg total) by mouth at bedtime. 120 tablet 4   buPROPion (WELLBUTRIN XL) 150 MG 24 hr tablet Take 1 tablet (150 mg  total) by mouth daily. 30 tablet 3   hydrocortisone 2.5 % cream Apply 1 application. topically 2 (two) times daily as needed (itching).     lidocaine (LIDODERM) 5 % Place 1 patch onto the skin daily. Apply at 8 am and remove at  pm daily. (Patient taking differently: Place 1 patch onto the skin daily as needed (pain.). Apply at 8 am and remove at  pm daily.) 30 patch 0   methylphenidate (RITALIN) 10 MG tablet Take 1 tablet (10 mg total) by mouth 2 (two) times daily with breakfast and lunch. 60 tablet 0   methylphenidate (RITALIN) 10 MG tablet Take 1 tablet (10 mg total) by mouth 2 (two) times daily with breakfast and lunch. At 0700 and 1200 daily 60 tablet 0   senna-docusate (SENOKOT-S) 8.6-50 MG tablet Take 2 tablets by mouth at bedtime. 60 tablet 0   ticagrelor (BRILINTA) 90 MG TABS tablet Take 1 tablet (90 mg total) by mouth 2 (two) times daily. 60 tablet 4   No current facility-administered medications on file prior to visit.    Allergies:  No Known Allergies    OBJECTIVE:  Physical Exam  Vitals:   04/26/21 1430  BP: 130/77  Pulse: (!) 107  Weight: 189 lb (85.7 kg)  Height: '5\' 6"'$  (1.676 m)    Body mass index is 30.51 kg/m. No results found.  General: well developed, well nourished, pleasant middle-aged African-American female, seated, in no evident distress Head: head normocephalic and atraumatic.   Neck: supple with no carotid or supraclavicular bruits Cardiovascular: regular rate and rhythm, no murmurs Musculoskeletal: no deformity Skin:  no rash/petichiae Vascular:  Normal pulses all extremities   Neurologic Exam Mental Status: Awake and fully alert.  Fluent speech and language although minimally participated in conversation.  Able to follow commands without difficulty. Oriented to place and time. Recent and remote memory impaired. Attention span, concentration and fund of knowledge impaired. Mood and affect flat.  Cranial Nerves: Pupils equal, briskly reactive to  light. Extraocular movements full without nystagmus. Visual fields full to confrontation. Hearing intact. Facial sensation intact.  Face, tongue, palate moves  normally and symmetrically.  Motor: Normal strength, bulk and tone on right side.  LUE: 4+-5/5; LLE: 4+/5  Sensory.: intact to touch , pinprick , position and vibratory sensation.  Coordination: Rapid alternating movements normal in all extremities except decreased left hand. Finger-to-nose and heel-to-shin performed accurately on right side.  Orbits right arm over left lower Gait and Station: Arises from chair without difficulty. Stance is normal. Gait demonstrates normal stride length with mild imbalance and slight decreased L step height without use of assistive device.  Tandem walk and heel toe not attempted.  Reflexes: 1+ and symmetric. Toes downgoing.        ASSESSMENT: Betty Archer is a 61 y.o. year old female with recent large right ACA and small left ACA infarcts on 08/31/2020 likely related to recent ACOM aneurysm s/p coiling and stenting. Vascular risk factors include cerebral aneurysms s/p R ACOM coiling and stenting 08/25/2020 and s/p left MCA aneurysm embolization 01/17/2021 HTN, HLD and tobacco use.      PLAN:  Large right ACA and small left ACA infarcts:  Residual deficit: Left hemiparesis, gait impairment and cognitive impairment. Improvement noted since prior visit! Discussed importance of doing routine exercise, both physical and mental, at home as currently overall sedentary.  Continue aspirin 81 mg daily  and atorvastatin 80 mg daily for secondary stroke prevention.   Discussed secondary stroke prevention measures and importance of close PCP follow up for aggressive stroke risk factor management. I have gone over the pathophysiology of stroke, warning signs and symptoms, risk factors and their management in some detail with instructions to go to the closest emergency room for symptoms of concern. HTN: BP goal <130/90.   Stable on nonpharmacological management per PCP HLD: LDL goal <70.  LDL 49 from 131.  Continue atorvastatin 80 mg daily - request ongoing monitoring and management by PCP Cerebral aneurysms: Followed by IR Dr. Norma Fredrickson - advised ongoing duration of Brilinta to be determined by Dr. Norma Fredrickson - did reach out to IR to assist with scheduling f/u visit after left MCA aneurysm repair in 12/2020 Tobacco use: Discussed importance of complete tobacco cessation. Advised ongoing use greatly increases risk of additional strokes and cardiovascular disease. Encouraged her to f/u with PCP if assistance in quitting is needed    Doing well from stroke standpoint and risk factors are managed by PCP. She may follow up PRN, as usual for our patients who are strictly being followed for stroke. If any new neurological issues should arise, request PCP place referral for evaluation by one of our neurologists. Thank you.     CC:  PCP: Jola Baptist, PA-C    I spent 34 minutes of face-to-face and non-face-to-face time with patient and son.  This included previsit chart review, lab review, study review, electronic health record documentation, patient and son education regarding prior stroke including etiology, secondary stroke prevention measures and importance of managing stroke risk factors, residual deficits and answered all other questions to patient and sons satisfaction  Frann Rider, AGNP-BC  [facility] [address] [place], [place] [address]  Phone [phone] Fax [phone] Note: This document was prepared with digital dictation and possible smart phrase technology. Any transcriptional errors that result from this process are unintentional.

## 2021-04-26 NOTE — Patient Instructions (Addendum)
Continue aspirin 81 mg daily and Brilinta (ticagrelor) 90 mg bid  and atorvastatin  for secondary stroke prevention ? ?Please ensure you schedule a follow up visit with Dr. Norma Fredrickson for follow up regarding your cerebral aneurysm  ? ?Continue to follow up with PCP regarding cholesterol and blood pressure management  ?Maintain strict control of hypertension with blood pressure goal below 130/90 and cholesterol with LDL cholesterol (bad cholesterol) goal below 70 mg/dL.  ? ?Signs of a Stroke? Follow the BEFAST method:  ?Balance Watch for a sudden loss of balance, trouble with coordination or vertigo ?Eyes Is there a sudden loss of vision in one or both eyes? Or double vision?  ?Face: Ask the person to smile. Does one side of the face droop or is it numb?  ?Arms: Ask the person to raise both arms. Does one arm drift downward? Is there weakness or numbness of a leg? ?Speech: Ask the person to repeat a simple phrase. Does the speech sound slurred/strange? Is the person confused ? ?Time: If you observe any of these signs, call 911. ? ? ? ? ? ? ? ?Thank you for coming to see [place] at [facility]. I hope we have been able to provide you high quality care today. ? ?You may receive a patient satisfaction survey over the next few weeks. We would appreciate your feedback and comments so that we may continue to improve ourselves and the health of our patients. ? ?

## 2021-04-29 ENCOUNTER — Other Ambulatory Visit: Payer: Self-pay

## 2021-05-02 ENCOUNTER — Other Ambulatory Visit: Payer: Self-pay

## 2021-05-09 ENCOUNTER — Other Ambulatory Visit: Payer: Self-pay

## 2021-05-12 ENCOUNTER — Other Ambulatory Visit: Payer: Self-pay

## 2021-05-25 ENCOUNTER — Encounter: Payer: Self-pay | Admitting: Internal Medicine

## 2021-06-03 ENCOUNTER — Other Ambulatory Visit: Payer: Self-pay

## 2021-06-06 ENCOUNTER — Other Ambulatory Visit: Payer: Self-pay

## 2021-07-06 ENCOUNTER — Encounter: Payer: Self-pay | Admitting: Physical Medicine & Rehabilitation

## 2021-07-06 ENCOUNTER — Encounter: Payer: Medicaid Other | Attending: Registered Nurse | Admitting: Physical Medicine & Rehabilitation

## 2021-07-06 VITALS — BP 117/78 | HR 77 | Ht 66.0 in | Wt 194.6 lb

## 2021-07-06 DIAGNOSIS — I63529 Cerebral infarction due to unspecified occlusion or stenosis of unspecified anterior cerebral artery: Secondary | ICD-10-CM

## 2021-07-06 DIAGNOSIS — Z8673 Personal history of transient ischemic attack (TIA), and cerebral infarction without residual deficits: Secondary | ICD-10-CM

## 2021-07-06 DIAGNOSIS — F329 Major depressive disorder, single episode, unspecified: Secondary | ICD-10-CM | POA: Diagnosis present

## 2021-07-06 MED ORDER — BUPROPION HCL ER (XL) 300 MG PO TB24: 300.0000 mg | ORAL_TABLET | Freq: Every day | ORAL | 5 refills | Status: DC

## 2021-07-06 NOTE — Progress Notes (Signed)
? ?Subjective:  ? ? Patient ID: Betty Archer, female    DOB: 1960/07/28, 61 y.o.   MRN: [id number] ? ?HPI ?Betty Archer is here in follow-up of her right ACA and left ACA infarcts.  In November of last year she had a left MCA embolization procedure which appears to have gone fairly well.  ? ?I asked her about a typical day. She does some chores around the house including washing dishes and clothes. She walks a few days a week. She'll go around a circle in neighborhood 5 times or so.  All in all, it sounds as if she is fairly sedentary. ? ?Pt reports regular bowel and bladder habits. Appetite has been normal. She denies any falls or mishaps.  ? ?She feels that she's depressed about being home and not being able to pay bills.  She like to get back to work.  She is on Wellbutrin 150 mg daily.  I also had her on Ritalin for attention initiation as well as a boost for her mood, but does not seem as if she is using this on a regular basis.  The last prescription should have been filled in early March and her son states that she still has supply left. ? 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  ? ? ? ? ?Pain Inventory ?Average Pain 0 ?Pain Right Now 0 ?My pain is  no pain ? ?LOCATION OF PAIN  no pain ? ?BOWEL ?Number of stools per week: 7 ?Oral laxative use Yes  ?Type of laxative stool softner ?Enema or suppository use No  ?History of colostomy No  ?Incontinent No  ? ?BLADDER ?Pads ?In and out cath, frequency . ?Able to self cath  . ?Bladder incontinence No  ?Frequent urination No  ?Leakage with coughing No  ?Difficulty starting stream No  ?Incomplete bladder emptying No  ? ? ?Mobility ?walk with assistance ?use a cane ?how many minutes can you walk? 10 ?ability to climb steps?  yes ?do you drive?  no ? ?Function ?disabled: date disabled . ?I need assistance with the following:  household duties and shopping ? ?Neuro/Psych ?bladder control problems ?trouble  walking ?depression ? ?Prior Studies ?Any changes since last visit?  no ? ?Physicians involved in your care ?Any changes since last visit?  no ? ? ?Family History  ?Problem Relation Age of Onset  ? Hypertension Mother   ? Lung cancer Father   ? Breast cancer Maternal Grandmother   ? Breast cancer Maternal Aunt   ? Colon cancer Neg Hx   ? Esophageal cancer Neg Hx   ? Stomach cancer Neg Hx   ? ?Social History  ? ?Socioeconomic History  ? Marital status: Single  ?  Spouse name: Not on file  ? Number of children: 3  ? Years of education: Not on file  ? Highest education level: Not on file  ?Occupational History  ? 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  Employer: [employer]  ?Tobacco Use  ? Smoking status: Some Days  ?  Packs/day: 0.50  ?  Years: 27.00  ?  Pack years: 13.50  ?  Types: Cigarettes  ? Smokeless tobacco: Never  ? Tobacco comments:  ?  11/02/20 one a day  ?Vaping Use  ? Vaping Use: Never used  ?Substance and Sexual Activity  ? Alcohol use: Yes  ?  Alcohol/week: 0.0 standard drinks  ?  Comment:  patient is a social drinker  ? Drug use: No  ? Sexual activity: Not on file  ?Other Topics Concern  ? Not on file  ?Social History Narrative  ?  She has 3 son who are healthy ,   ? 11/02/20 lives with son, [name]   ? 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  ? ?Social Determinants of Health  ? ?Financial Resource Strain: Not on file  ?Food Insecurity: Not on file  ?Transportation Needs: Not on file  ?Physical Activity: Not on file  ?Stress: Not on file  ?Social Connections: Not on file  ? ?Past Surgical History:  ?Procedure Laterality Date  ? BREAST BIOPSY    ?  Core biopsy done on April 2003  ? COLONOSCOPY    ? IR 3D INDEPENDENT WKST  08/25/2020  ? IR 3D INDEPENDENT WKST  08/25/2020  ? IR 3D INDEPENDENT WKST  01/17/2021  ? IR ANGIO EXTRACRAN SEL COM CAROTID INNOMINATE UNI R MOD SED  01/17/2021  ? IR ANGIO INTRA EXTRACRAN SEL INTERNAL CAROTID BILAT MOD SED  08/25/2020  ? IR ANGIO INTRA  EXTRACRAN SEL INTERNAL CAROTID UNI L MOD SED  01/17/2021  ? IR ANGIO VERTEBRAL SEL VERTEBRAL BILAT MOD SED  08/25/2020  ? IR CT HEAD LTD  08/25/2020  ? IR CT HEAD LTD  01/17/2021  ? IR INTRA CRAN STENT  08/25/2020  ? IR TRANSCATH/EMBOLIZ  08/25/2020  ? IR TRANSCATH/EMBOLIZ  01/17/2021  ? IR US GUIDE VASC ACCESS RIGHT  08/25/2020  ? IR US GUIDE VASC ACCESS RIGHT  01/17/2021  ? PARTIAL HYSTERECTOMY   10/22/1999  ?  Still has cervix  ? RADIOLOGY WITH ANESTHESIA N/A 08/25/2020  ? Procedure: IR WITH ANESTHESIA EMBOLIZATION;  Surgeon: Pedro Earls, MD;  Location: [place];  Service: Radiology;  Laterality: N/A;  ? RADIOLOGY WITH ANESTHESIA N/A 01/17/2021  ? Procedure: RADIOLOGY WITH ANESTHESIA  EMBOLIZATION;  Surgeon: Pedro Earls, MD;  Location: [place];  Service: Radiology;  Laterality: N/A;  ? TRIGGER FINGER RELEASE Left 12/25/2013  ? Procedure: LEFT THUMB TRIGGER RELEASE ;  Surgeon: Leanora Cover, MD;  Location: [place];  Service: Orthopedics;  Laterality: Left;  ? ?Past Medical History:  ?Diagnosis Date  ? Brain aneurysm   ? Cerebral aneurysm   ? Cognitive communication deficit   ? Hemiparesis ([place])   ? Hemiplegia ([place])   ? Hyperlipidemia   ? Hyperplastic colon polyp   ? Internal hemorrhoids   ? Stroke [facility])   ? TIA (transient ischemic attack)   ? ?BP 117/78   Pulse 77   Ht '5\' 6"'$  (1.676 m)   Wt 194 lb 9.6 oz (88.3 kg)   SpO2 95%   BMI 31.41 kg/m?  ? ?Opioid Risk Score:   ?Fall Risk Score:  `1 ? ?Depression screen PHQ 2/9 ? ? ?  07/06/2021  ? 10:06 AM 03/23/2021  ? 11:15 AM 12/29/2020  ? 10:49 AM 12/01/2020  ?  9:49 AM 11/02/2020  ? 11:41 AM 10/26/2020  ?  9:44 AM 10/13/2020  ? 10:31 AM  ?Depression screen PHQ 2/9  ?Decreased Interest 1 1 0 0 0 0 0  ?Down, Depressed, Hopeless 1 1 0 0 0 0 0  ?PHQ - 2 Score 2 2 0 0 0 0 0  ?Altered sleeping    0  3 0  ?Tired, decreased energy    1  3 0  ?Change in appetite    0  0 0  ?Feeling bad or failure about yourself     0  0 0  ?Trouble concentrating    2   0 0  ?Moving slowly or fidgety/restless    '2  2 2  '$ ?Suicidal thoughts    0  0 0  ?PHQ-9 Score    '5  8 2  '$ ?Difficult doing work/chores      Not difficult at all   ?  ? ?Review of Systems  ?Gastrointestinal:  Positive for constipation.  ?Musculoskeletal:  Positive for gait problem.  ?Psychiatric/Behavioral:  Positive for dysphoric mood.   ?All other systems reviewed and are negative. ? ?   ?Objective:  ? Physical Exam ?General: No acute distress ?HEENT: NCAT, EOMI, oral membranes moist.  Nasal passageways are intact ?Cards: reg rate  ?Chest: normal effort ?Abdomen: Soft, NT, ND ?Skin: dry, intact ?Extremities: no edema ?Psych: Cooperative but flat. ?Skin: intact ?Neuro: Patient is alert oriented to person and place.  Fair insight and awareness.  Still with delayed processing.  Needed a lot of cueing to provide me some of her personal information.  Attention does seem a bit better however initiation is not.  LUE 4/5. LLE 4+/5.  Balance has improved.  She is walking without a device.  Musculoskeletal: No pain today with gait or range of motion..  ?  ?  ?  ?  ?  ?Assessment & Plan:  ?Medical Problem List and Plan: ?1.  Altered mental status with decreased functional mobility/left lower extremity weakness secondary to large right ACA and small left ACA infarct likely related to recent ACOM aneurysm post coiling and stenting ?            -Patient has made some progress but is plateauing to extent from a cognitive standpoint.  Discussed increasing activity at home especially from a cognitive standpoint.  I like her to start taking on some more responsibilities.  This would involve keeping a regular schedule, at least Monday through Fridays for typical tasks and chores around the house.  She certainly could work on trying to [employer] with supervision.  I also think she could be home alone by herself for brief periods of time initially to judge her safety awareness.  Not until she expands her activities from a household level  could we consider vocational reentry.  Even then not sure if she is completely appropriate.   ?2.  stroke prophylaxis ?            -antiplatelet therapy: Brilinta 90 mg twice daily, aspirin 81 mg daily ?3. Pain Ma

## 2021-07-06 NOTE — Patient Instructions (Addendum)
PLEASE FEEL FREE TO CALL OUR OFFICE WITH ANY PROBLEMS OR QUESTIONS ([phone]) ? ? ?TAKE ON SOME MORE RESPONSIBILITIES AT HOME ? ?MAKE A DAILY SCHEDULE/CALENDAR (Monday THROUGH Friday)  ? ?EXPERIMENT WITH SOME TIME ALONE AT HOME.  ? ?INCREASE PHYSICAL ACTIVITY ? ? ?                ?

## 2021-07-28 ENCOUNTER — Other Ambulatory Visit: Payer: Self-pay

## 2021-07-29 ENCOUNTER — Telehealth: Payer: Self-pay | Admitting: *Deleted

## 2021-07-29 ENCOUNTER — Other Ambulatory Visit: Payer: Self-pay

## 2021-07-29 DIAGNOSIS — F329 Major depressive disorder, single episode, unspecified: Secondary | ICD-10-CM

## 2021-07-29 DIAGNOSIS — I63529 Cerebral infarction due to unspecified occlusion or stenosis of unspecified anterior cerebral artery: Secondary | ICD-10-CM

## 2021-07-29 MED ORDER — METHYLPHENIDATE HCL 10 MG PO TABS
10.0000 mg | ORAL_TABLET | Freq: Two times a day (BID) | ORAL | 0 refills | Status: DC
  Filled 2021-07-29 – 2021-09-13 (×2): qty 60, 30d supply, fill #0

## 2021-07-29 MED ORDER — METHYLPHENIDATE HCL 10 MG PO TABS
10.0000 mg | ORAL_TABLET | Freq: Two times a day (BID) | ORAL | 0 refills | Status: DC
  Filled 2021-07-29: qty 60, 30d supply, fill #0

## 2021-07-29 NOTE — Telephone Encounter (Signed)
Notified. 

## 2021-07-29 NOTE — Telephone Encounter (Signed)
Mr Baltz called for a refill of his mother's ritalin.

## 2021-07-29 NOTE — Telephone Encounter (Signed)
Rx'es sent in

## 2021-08-01 ENCOUNTER — Other Ambulatory Visit: Payer: Self-pay

## 2021-08-15 ENCOUNTER — Other Ambulatory Visit (HOSPITAL_COMMUNITY): Payer: Self-pay | Admitting: Neuroradiology

## 2021-08-15 DIAGNOSIS — I671 Cerebral aneurysm, nonruptured: Secondary | ICD-10-CM

## 2021-08-18 ENCOUNTER — Telehealth (HOSPITAL_COMMUNITY): Payer: Self-pay

## 2021-08-18 NOTE — Telephone Encounter (Signed)
Returned call to let pt know that she is scheduled for mra on 7/28 at [facility] [facility]

## 2021-09-06 ENCOUNTER — Other Ambulatory Visit: Payer: Self-pay

## 2021-09-06 ENCOUNTER — Telehealth: Payer: Self-pay | Admitting: *Deleted

## 2021-09-06 DIAGNOSIS — E785 Hyperlipidemia, unspecified: Secondary | ICD-10-CM

## 2021-09-06 MED ORDER — TICAGRELOR 90 MG PO TABS
90.0000 mg | ORAL_TABLET | Freq: Two times a day (BID) | ORAL | 4 refills | Status: DC
Start: 2021-09-06 — End: 2023-06-20
  Filled 2021-09-06 – 2021-09-13 (×2): qty 60, 30d supply, fill #0

## 2021-09-06 NOTE — Telephone Encounter (Signed)
Mrs Tillman's son called and requested refills on Brilinta and Ritalin. It looks like the Ritalin has an Rx on hold at pharmacy.  The Brilinta was noted by on 04/26/21 visit with Frann Rider NP [facility] to be determined by Dr Karenann Cai with IR.  Should they contact [facility] about this?

## 2021-09-06 NOTE — Telephone Encounter (Signed)
I refilled brilinta for him, but this needs to be filled by primary in future. Yes, she should have another rx for ritaln at pharmacy.

## 2021-09-13 ENCOUNTER — Other Ambulatory Visit: Payer: Self-pay

## 2021-09-13 ENCOUNTER — Telehealth (HOSPITAL_COMMUNITY): Payer: Self-pay | Admitting: Physical Medicine & Rehabilitation

## 2021-09-13 DIAGNOSIS — I63529 Cerebral infarction due to unspecified occlusion or stenosis of unspecified anterior cerebral artery: Secondary | ICD-10-CM

## 2021-09-13 MED ORDER — METHYLPHENIDATE HCL 10 MG PO TABS: 10.0000 mg | ORAL_TABLET | Freq: Two times a day (BID) | ORAL | 0 refills | Status: DC

## 2021-09-13 NOTE — Telephone Encounter (Signed)
Rx sent to [facility] on [address] as [facility] pharmacy was out of ritalin

## 2021-09-14 ENCOUNTER — Other Ambulatory Visit: Payer: Self-pay

## 2021-09-16 ENCOUNTER — Ambulatory Visit (HOSPITAL_COMMUNITY)
Admission: RE | Admit: 2021-09-16 | Discharge: 2021-09-16 | Disposition: A | Discharge status: Home / Self Care | Payer: Medicaid Other | Source: Ambulatory Visit | Attending: Neuroradiology | Admitting: Neuroradiology

## 2021-09-16 DIAGNOSIS — I671 Cerebral aneurysm, nonruptured: Secondary | ICD-10-CM | POA: Diagnosis present

## 2021-09-29 ENCOUNTER — Other Ambulatory Visit (HOSPITAL_COMMUNITY): Payer: Self-pay | Admitting: Neuroradiology

## 2021-09-29 DIAGNOSIS — I671 Cerebral aneurysm, nonruptured: Secondary | ICD-10-CM

## 2021-10-07 ENCOUNTER — Ambulatory Visit (HOSPITAL_COMMUNITY)
Admission: RE | Admit: 2021-10-07 | Discharge: 2021-10-07 | Disposition: A | Discharge status: Home / Self Care | Payer: Medicaid Other | Source: Ambulatory Visit | Attending: Neuroradiology | Admitting: Neuroradiology

## 2021-10-07 DIAGNOSIS — I671 Cerebral aneurysm, nonruptured: Secondary | ICD-10-CM
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
## 2021-11-09 ENCOUNTER — Other Ambulatory Visit: Payer: Self-pay

## 2021-11-09 ENCOUNTER — Encounter: Payer: Self-pay | Admitting: Physical Medicine & Rehabilitation

## 2021-11-09 ENCOUNTER — Encounter: Payer: Medicaid Other | Attending: Physical Medicine & Rehabilitation | Admitting: Physical Medicine & Rehabilitation

## 2021-11-09 VITALS — BP 129/86 | HR 89 | Ht 66.0 in | Wt 204.8 lb

## 2021-11-09 DIAGNOSIS — F329 Major depressive disorder, single episode, unspecified: Secondary | ICD-10-CM | POA: Diagnosis present

## 2021-11-09 DIAGNOSIS — I69319 Unspecified symptoms and signs involving cognitive functions following cerebral infarction: Secondary | ICD-10-CM | POA: Diagnosis not present

## 2021-11-09 DIAGNOSIS — I63529 Cerebral infarction due to unspecified occlusion or stenosis of unspecified anterior cerebral artery: Secondary | ICD-10-CM

## 2021-11-09 DIAGNOSIS — Z8673 Personal history of transient ischemic attack (TIA), and cerebral infarction without residual deficits: Secondary | ICD-10-CM | POA: Diagnosis present

## 2021-11-09 MED ORDER — METHYLPHENIDATE HCL 10 MG PO TABS
10.0000 mg | ORAL_TABLET | Freq: Two times a day (BID) | ORAL | 0 refills | Status: DC
  Filled 2021-11-09: qty 60, 30d supply, fill #0

## 2021-11-09 MED ORDER — METHYLPHENIDATE HCL 10 MG PO TABS
10.0000 mg | ORAL_TABLET | Freq: Two times a day (BID) | ORAL | 0 refills | Status: DC
  Filled 2021-11-09 (×2): qty 60, 30d supply, fill #0
  Filled 2021-11-15: qty 40, 20d supply, fill #0
  Filled 2021-11-15: qty 20, 10d supply, fill #0

## 2021-11-09 NOTE — Progress Notes (Signed)
Subjective:    Patient ID: Betty Archer, female    DOB: 11/23/1960, 61 y.o.   MRN: [id number]  HPI Betty Archer is here in follow up of her right ACA and left ACA infarcts.   She tells me she was at her sisters this past weekend to hang out. She doesn't remember what she watched.   She is doing some basic household chores but she doesn't have a routine. She stopped walking. She likes to go to friends houses to visit. She denies being depressed. Appetite is good.   She is trying to take her ritalin every day. She notices a difference in her initiation and arousal..   Brillinta was stopped   Pain Inventory Average Pain 0 Pain Right Now 0 My pain is  no pain  LOCATION OF PAIN  n/a  BOWEL Number of stools per week: 3  BLADDER Normal    Mobility walk without assistance walk with assistance use a cane use a walker ability to climb steps?  yes do you drive?  no  Function retired  Neuro/Psych depression  Prior Studies Any changes since last visit?  no  Physicians involved in your care Any changes since last visit?  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    Employer: [employer]  Tobacco Use   Smoking status: Some Days    Packs/day: 0.50    Years: 27.00    Total pack years: 13.50    Types: Cigarettes   Smokeless tobacco: Never   Tobacco comments:    11/02/20 one a day  Vaping Use   Vaping Use: Never used  Substance and Sexual Activity   Alcohol use: Yes    Alcohol/week: 0.0  standard drinks of alcohol    Comment:  patient is a social drinker   Drug use: No   Sexual activity: Not on file  Other Topics Concern   Not on file  Social History Narrative    She has 3 son who are healthy ,    11/02/20 lives with son, [name]    worked as a Research officer, 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   Social Determinants of Health   Financial Resource Strain: Not on file  Food Insecurity: Not on file  Transportation Needs: Not on file  Physical Activity: Not on file  Stress: Not on file  Social Connections: Not on file   Past Surgical History:  Procedure Laterality Date   BREAST BIOPSY      Core biopsy done on April 2003   COLONOSCOPY     IR 3D INDEPENDENT WKST  08/25/2020   IR 3D INDEPENDENT WKST  08/25/2020   IR 3D INDEPENDENT WKST  01/17/2021   IR ANGIO EXTRACRAN SEL COM CAROTID INNOMINATE UNI R MOD SED  01/17/2021   IR ANGIO INTRA EXTRACRAN SEL INTERNAL CAROTID BILAT MOD SED  08/25/2020   IR ANGIO  INTRA EXTRACRAN SEL INTERNAL CAROTID UNI L MOD SED  01/17/2021   IR ANGIO VERTEBRAL SEL VERTEBRAL BILAT MOD SED  08/25/2020   IR CT HEAD LTD  08/25/2020   IR CT HEAD LTD  01/17/2021   IR INTRA CRAN STENT  08/25/2020   IR TRANSCATH/EMBOLIZ  08/25/2020   IR TRANSCATH/EMBOLIZ  01/17/2021   IR US GUIDE VASC ACCESS RIGHT  08/25/2020   IR US GUIDE VASC ACCESS RIGHT  01/17/2021   PARTIAL HYSTERECTOMY   10/22/1999    Still has cervix   RADIOLOGY WITH ANESTHESIA N/A 08/25/2020   Procedure: IR WITH ANESTHESIA EMBOLIZATION;  Surgeon: Pedro Earls, MD;  Location: [place];  Service: Radiology;  Laterality: N/A;   RADIOLOGY WITH ANESTHESIA N/A 01/17/2021   Procedure: RADIOLOGY WITH ANESTHESIA  EMBOLIZATION;  Surgeon: Pedro Earls, MD;  Location: [place];  Service: Radiology;  Laterality: N/A;   TRIGGER FINGER RELEASE Left 12/25/2013   Procedure: LEFT THUMB TRIGGER RELEASE ;  Surgeon: Leanora Cover, MD;  Location: [place];  Service: Orthopedics;  Laterality:  Left;   Past Medical History:  Diagnosis Date   Brain aneurysm    Cerebral aneurysm    Cognitive communication deficit    Hemiparesis ([place])    Hemiplegia ([place])    Hyperlipidemia    Hyperplastic colon polyp    Internal hemorrhoids    Stroke ([place])    TIA (transient ischemic attack)    BP 129/86   Pulse 89   Ht '5\' 6"'$  (1.676 m)   Wt 204 lb 12.8 oz (92.9 kg)   SpO2 97%   BMI 33.06 kg/m   Opioid Risk Score:   Fall Risk Score:  `1  Depression screen [facility] 2/9     11/09/2021   10:05 AM 07/06/2021   10:06 AM 03/23/2021   11:15 AM 12/29/2020   10:49 AM 12/01/2020    9:49 AM 11/02/2020   11:41 AM 10/26/2020    9:44 AM  Depression screen PHQ 2/9  Decreased Interest 0 1 1 0 0 0 0  Down, Depressed, Hopeless 0 1 1 0 0 0 0  PHQ - 2 Score 0 2 2 0 0 0 0  Altered sleeping     0  3  Tired, decreased energy     1  3  Change in appetite     0  0  Feeling bad or failure about yourself      0  0  Trouble concentrating     2  0  Moving slowly or fidgety/restless     2  2  Suicidal thoughts     0  0  PHQ-9 Score     5  8  Difficult doing work/chores       Not difficult at all     Review of Systems  Constitutional: Negative.   HENT: Negative.    Eyes: Negative.   Respiratory: Negative.    Cardiovascular: Negative.   Gastrointestinal: Negative.   Endocrine: Negative.   Genitourinary: Negative.   Musculoskeletal:  Positive for gait problem.  Skin: Negative.   Allergic/Immunologic: Negative.   Hematological: Negative.   Psychiatric/Behavioral:  Positive for dysphoric mood.       Objective:   Physical Exam General: No acute distress HEENT: NCAT, EOMI, oral membranes moist Cards: reg rate  Chest: normal effort Abdomen: Soft, NT, ND Skin: dry, intact Extremities: no edema Psych: pleasant but still a little flat  Skin: intact Neuro: Patient is alert  oriented to person and place.  Fair insight and awareness.  processing speed a little better  Attention is improving but she still  doesn't initiate unless cued.Marland Kitchen  LUE 4/5. LLE 4+/5.  Balance has improved.  She is walking without a device.  Musculoskeletal: No pain today with gait or range of motion..            Assessment & Plan:  Medical Problem List and Plan: 1.  Altered mental status with decreased functional mobility/left lower extremity weakness secondary to large right ACA and small left ACA infarct likely related to recent ACOM aneurysm post coiling and stenting             -again discussed need for regular routine, schedule. She's doing some things already but this needs to encompass herr exercise and social activities too 2.  stroke prophylaxis             -antiplatelet therapy: Brilinta stopped. aspirin 81 mg daily 3. Pain Management: Improved 4.  Tobacco use.  Tobacco cessation discussed. 5. Attention: continue ritalin '10mg'$  bid -We will continue the controlled substance monitoring program, this consists of regular clinic visits, examinations, routine drug screening, pill counts as well as use of [place] Controlled Substance Reporting System. [place] today.   Medication was refilled and a second prescription was sent to the patient's pharmacy for next month.   6.  Reactive depression:   Wellbutrin XR  300 mg daily has been effective.             - again discussed inreasing her physical activity which should help as well.  Also finding some purpose at home and increased engagement in the household with help also. She admits to not exercising as much   Thirty minutes of face to face patient care time were spent during this visit. All questions were encouraged and answered. Follow up with me in 4 months.         Assessment & Plan:

## 2021-11-09 NOTE — Patient Instructions (Signed)
CREATE A CONCRETE MONDAY THRU FRIDAY SCHEDULE FOR CHORES, EXERCISES, SOCIAL THINGS/FUN THINGS WHICH YOU FOLLOW EACH OF THOSE DAYS.   THAT'S HOW YOU WILL EXPAND YOUR MEMORY AND BRAIN AS A WHOLE!

## 2021-11-15 ENCOUNTER — Other Ambulatory Visit: Payer: Self-pay

## 2021-11-21 ENCOUNTER — Other Ambulatory Visit: Payer: Self-pay

## 2021-11-22 ENCOUNTER — Other Ambulatory Visit: Payer: Self-pay

## 2021-12-13 ENCOUNTER — Other Ambulatory Visit: Payer: Self-pay
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
## 2022-01-22 ENCOUNTER — Other Ambulatory Visit: Payer: Self-pay | Admitting: Physical Medicine & Rehabilitation

## 2022-01-22 DIAGNOSIS — I63529 Cerebral infarction due to unspecified occlusion or stenosis of unspecified anterior cerebral artery: Secondary | ICD-10-CM

## 2022-01-22 DIAGNOSIS — F329 Major depressive disorder, single episode, unspecified: Secondary | ICD-10-CM
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
## 2022-02-05 IMAGING — CT CT ABD-PELV W/O CM
2 of 4 series · 16 of 46 positions shown, 18 images · non-contrast
Comparison: None.

CLINICAL DATA: 59-year-old female status post neuro interventional
aneurysm procedure with concern for possible retroperitoneal
hemorrhage.

EXAM:
CT ABDOMEN AND PELVIS WITHOUT CONTRAST
TECHNIQUE: Multidetector CT imaging of the abdomen and pelvis was performed
following the standard protocol without IV contrast.

[Series 3: a/p w/o 5mm · axial · non-contrast · 0.98mm/px · z∈[+608,+1138]mm · 13 of 116 slices shown, 15 images]
[im 5/116  soft-tissue]
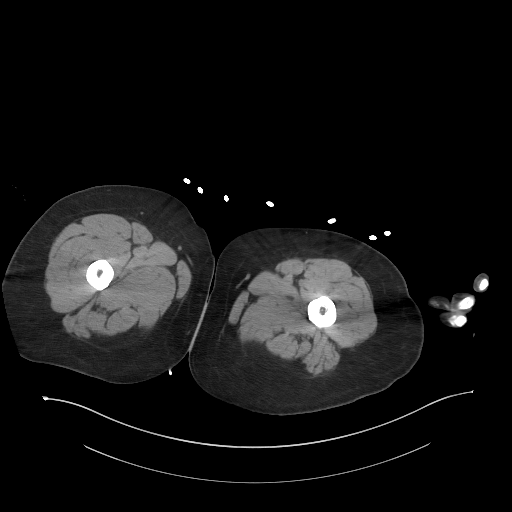
[im 5/116  bone]
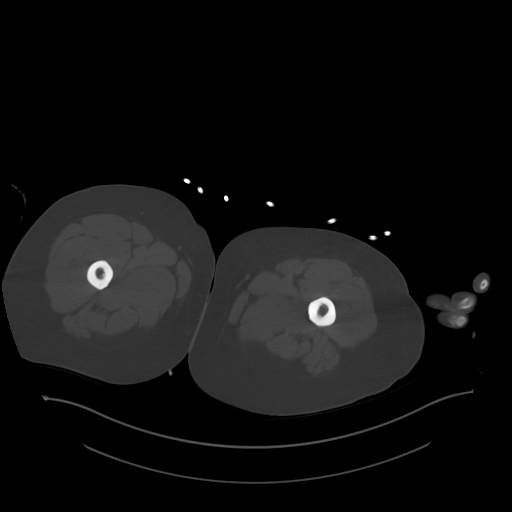
[im 14/116  soft-tissue]
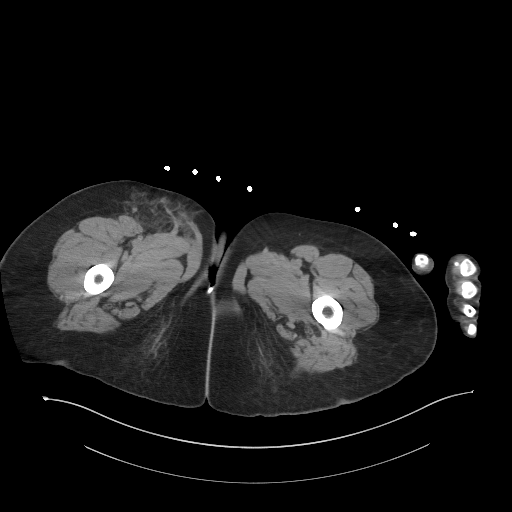
[im 23/116  soft-tissue]
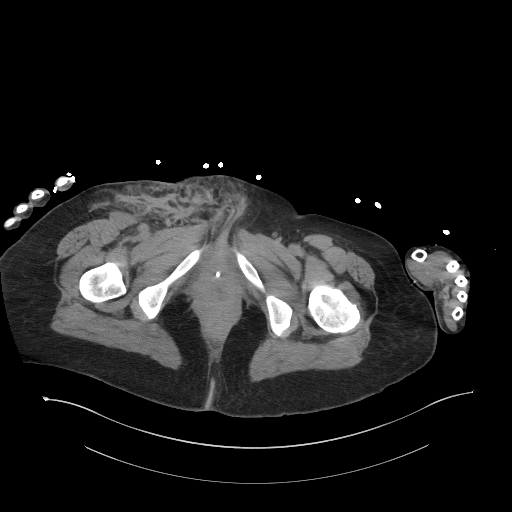
[im 31/116  soft-tissue]
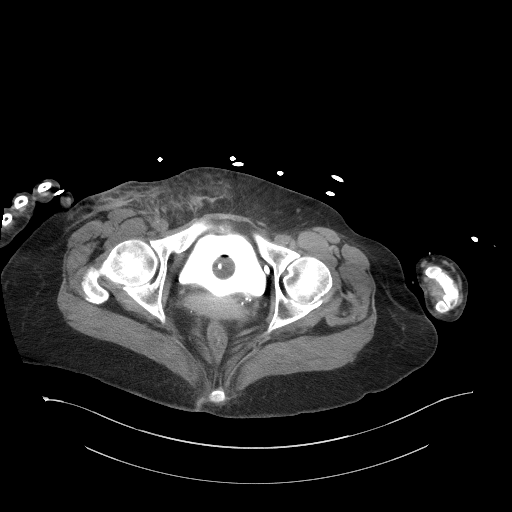
[im 40/116  soft-tissue]
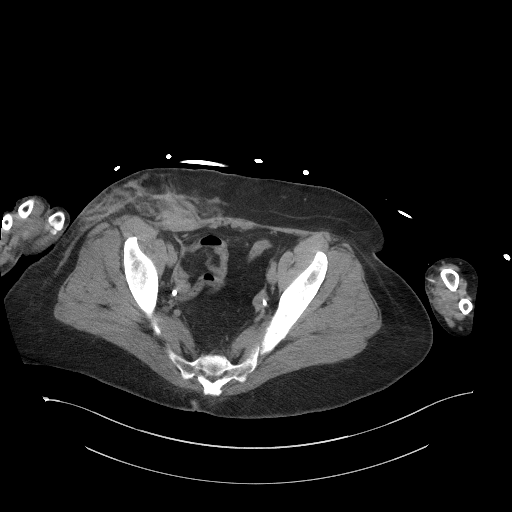
[im 49/116  soft-tissue]
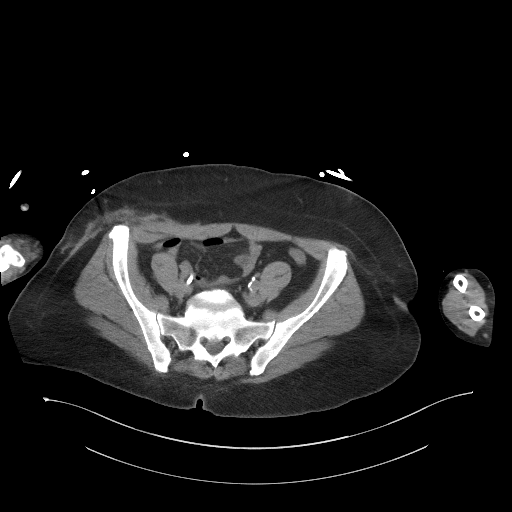
[im 58/116  soft-tissue]
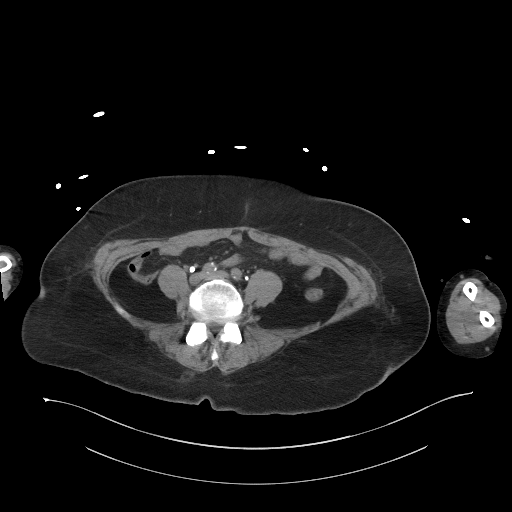
[im 67/116  soft-tissue]
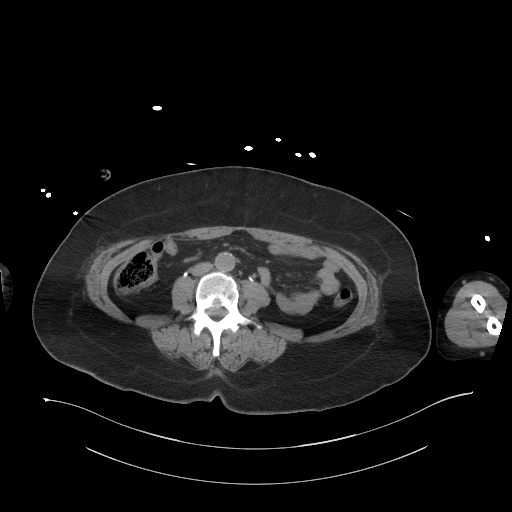
[im 76/116  soft-tissue]
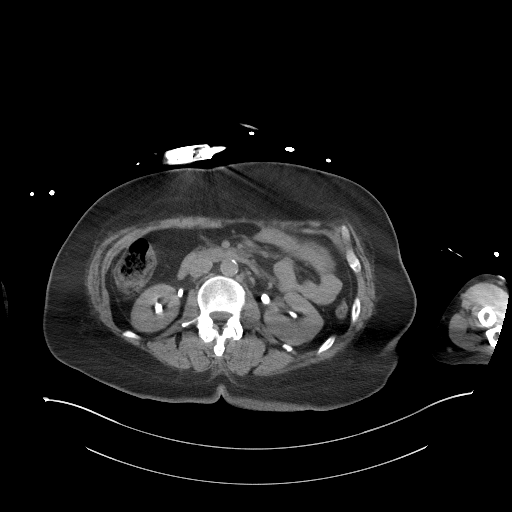
[im 76/116  bone]
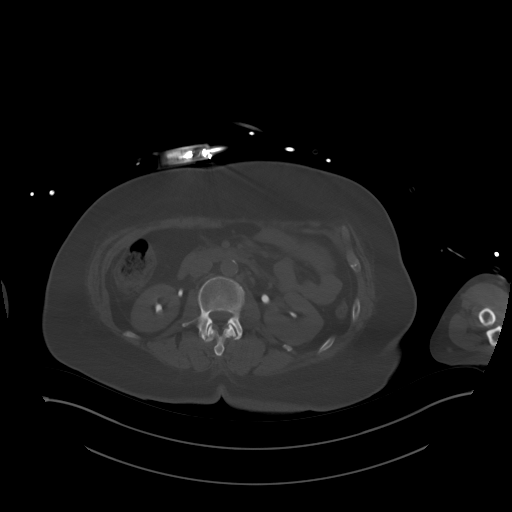
[im 85/116  soft-tissue]
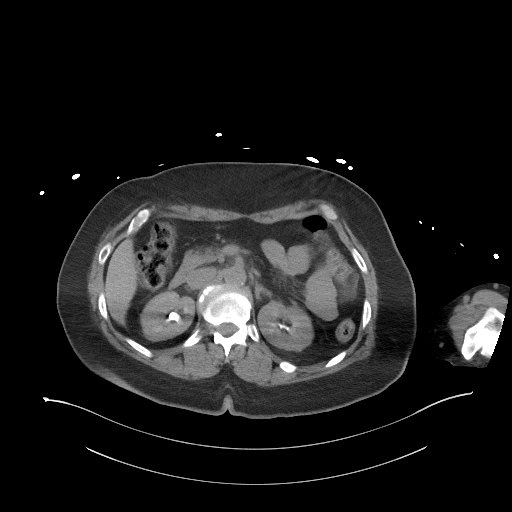
[im 93/116  soft-tissue]
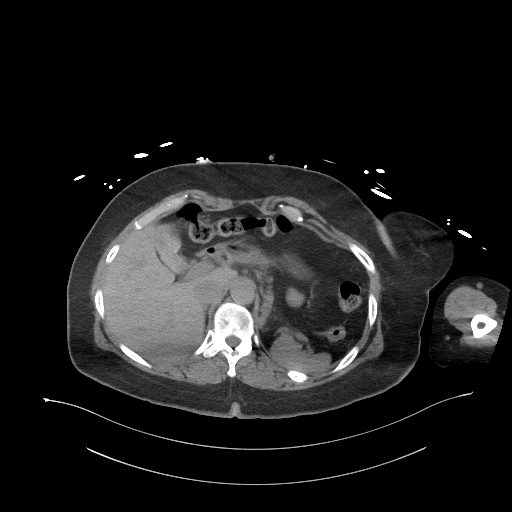
[im 102/116  soft-tissue]
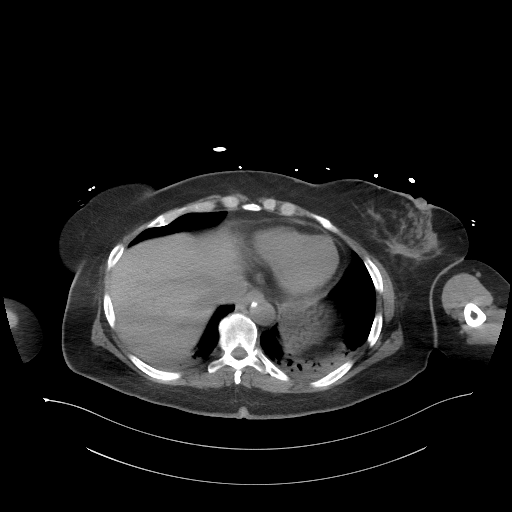
[im 111/116  soft-tissue]
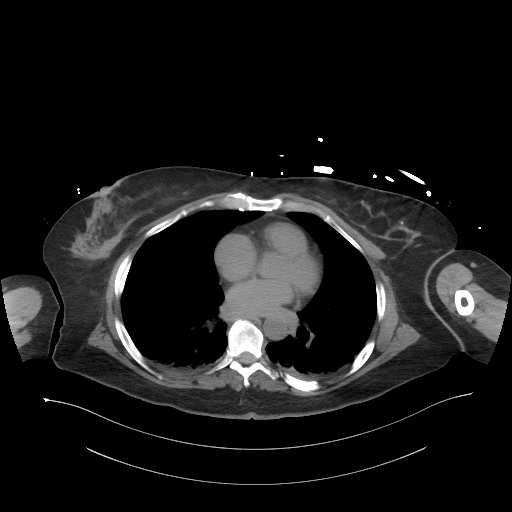

[Series 6: a/p w/o cor · coronal · non-contrast · 0.88mm/px · 3 of 150 slices shown]
[im 50/150  soft-tissue]
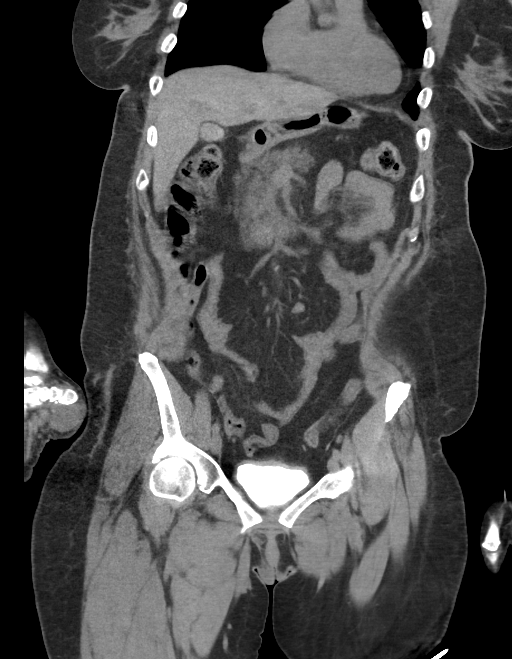
[im 67/150  soft-tissue]
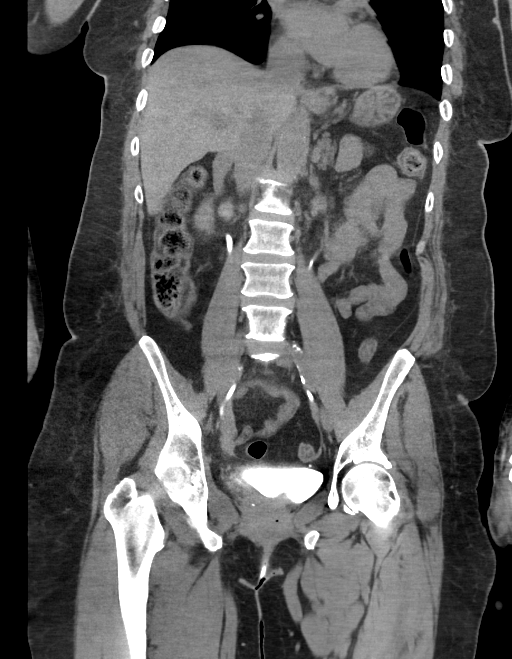
[im 83/150  soft-tissue]
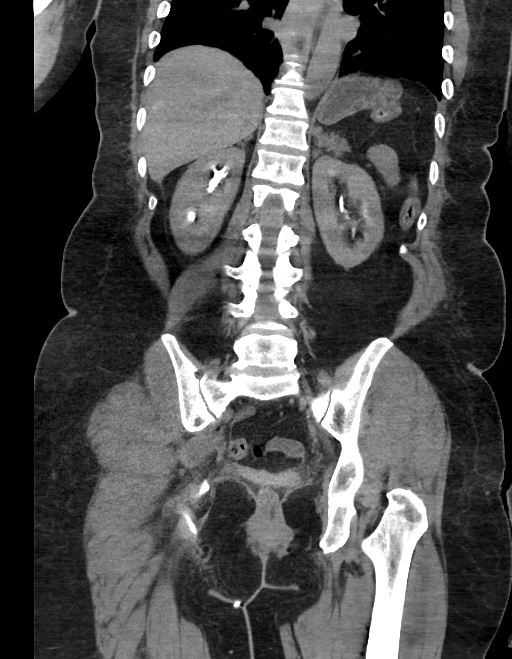

[16 of 46 positions shown; findings below may reference images not displayed]

FINDINGS: Lower chest: Bibasilar subsegmental atelectasis. The heart is normal
in size. No pericardial effusion.

Hepatobiliary: No focal liver abnormality is seen. No gallstones,
gallbladder wall thickening, or biliary dilatation.

Pancreas: Unremarkable. No pancreatic ductal dilatation or
surrounding inflammatory changes.

Spleen: Normal in size without focal abnormality.

Adrenals/Urinary Tract: Adrenal glands are unremarkable. Kidneys are
normal, without renal calculi, focal lesion, or hydronephrosis.
Bladder is decompressed with Foley catheter in place.

Stomach/Bowel: Stomach is within normal limits. Appendix appears
normal. No evidence of bowel wall thickening, distention, or
inflammatory changes.

Vascular/Lymphatic: Aortic atherosclerosis. No enlarged abdominal or
pelvic lymph nodes.

Reproductive: Status post hysterectomy. No adnexal masses.

Other: No abdominal wall hernia or abnormality. No abdominopelvic
ascites.

Musculoskeletal: Within the subcutaneous, extra-abdominal tissues
anterior to the right femoral sheath is extensive fat stranding.
There is no measurable fluid collection. No acute or significant
osseous findings.
IMPRESSION: 1. No retroperitoneal hemorrhage. Extensive right anterior groin,
extra-abdominal fat stranding compatible with hematoma in the
setting of recent vascular access. No single measurable fluid
collection is identified. This study is insufficient to evaluate for
pseudoaneurysm or active extravasation.
2.  Aortic Atherosclerosis ([id number]).
3. No acute abdominopelvic abnormality.

## 2022-02-11 IMAGING — CT CT HEAD CODE STROKE
3 series · 15 of 47 positions shown, 18 images · non-contrast
Comparison: Head CT 08/08/2020

CLINICAL DATA: Code stroke. Lower extremity tingling and
encephalopathy

EXAM:
CT HEAD WITHOUT CONTRAST
TECHNIQUE: Contiguous axial images were obtained from the base of the skull
through the vertex without intravenous contrast.

[Series 2: head wo · axial · 0.47mm/px · z∈[-118,+12]mm · 9 of 32 slices shown, 12 images]
[im 3/32  brain]
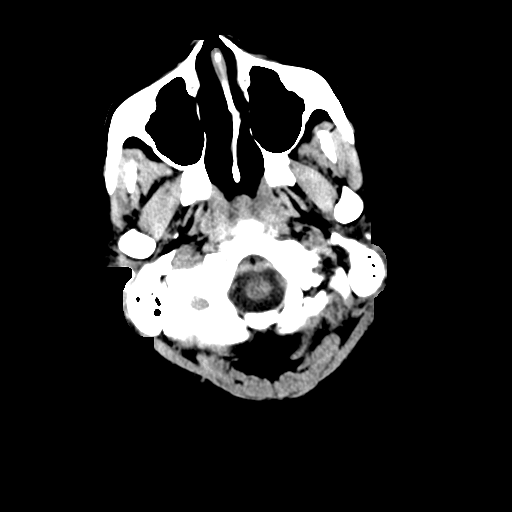
[im 3/32  bone]
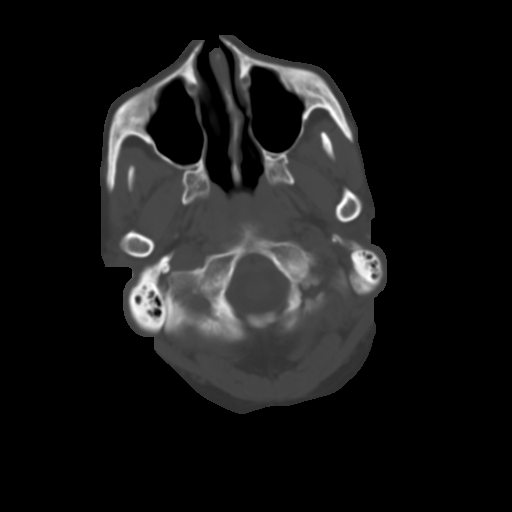
[im 6/32  brain]
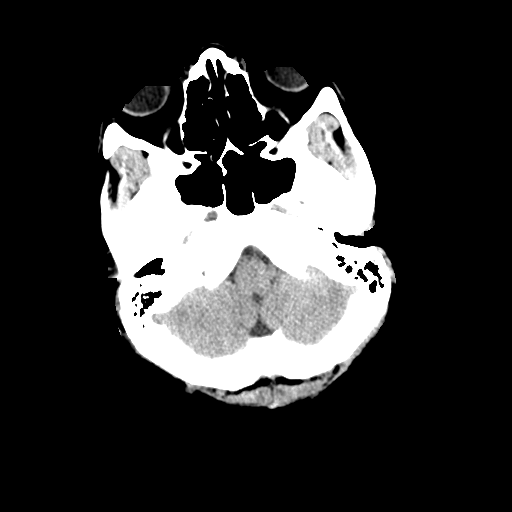
[im 9/32  brain]
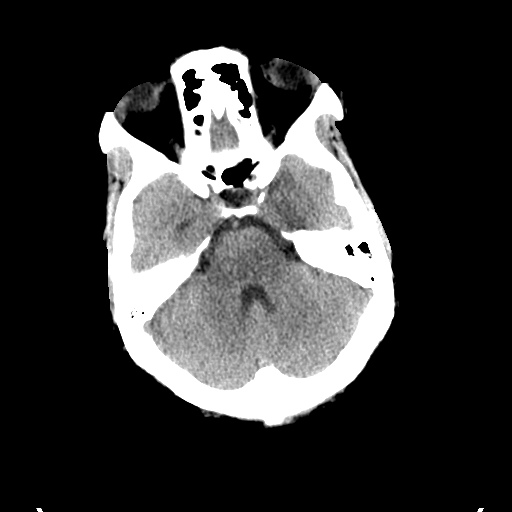
[im 12/32  brain]
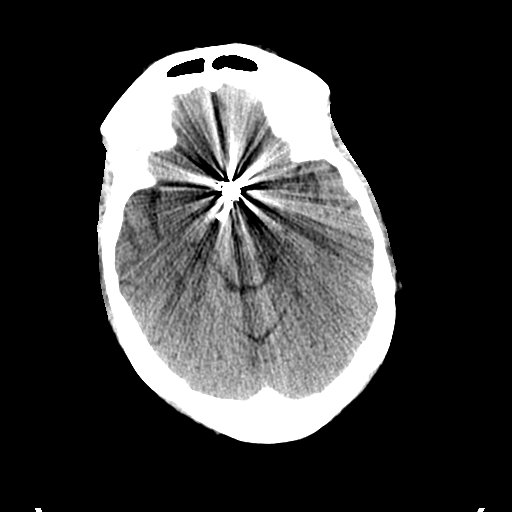
[im 17/32  brain]
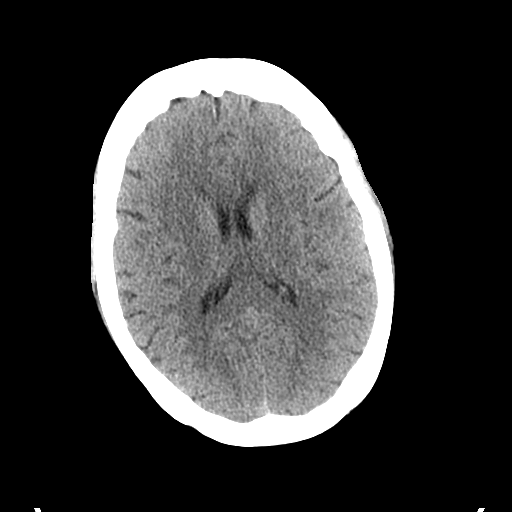
[im 17/32  bone]
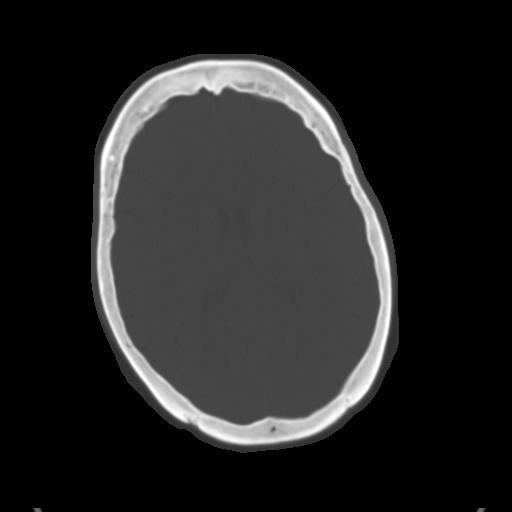
[im 20/32  brain]
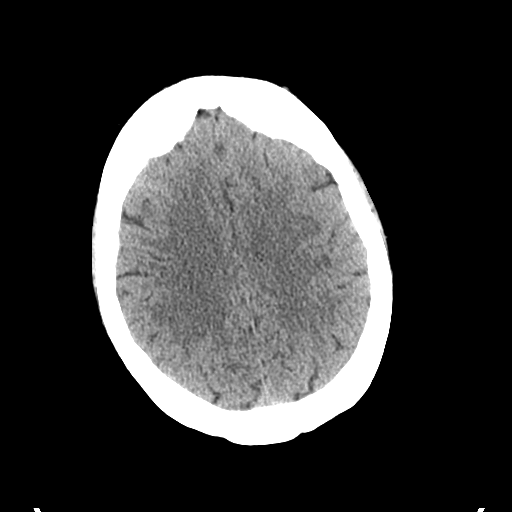
[im 23/32  brain]
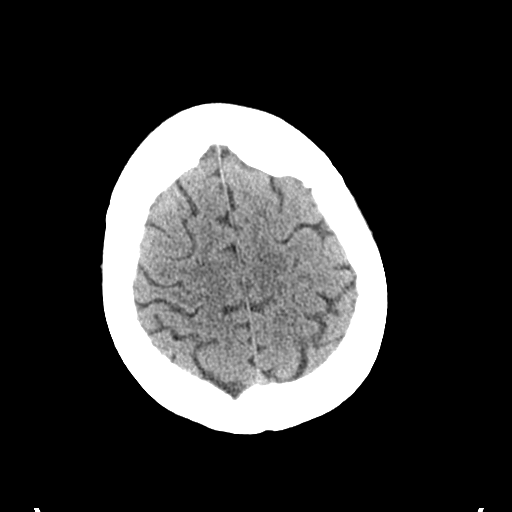
[im 26/32  brain]
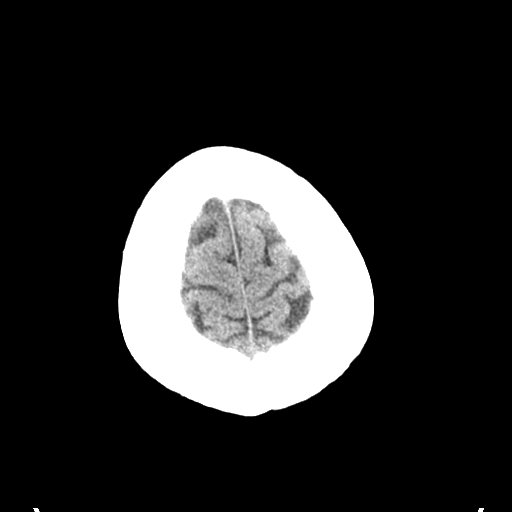
[im 29/32  brain]
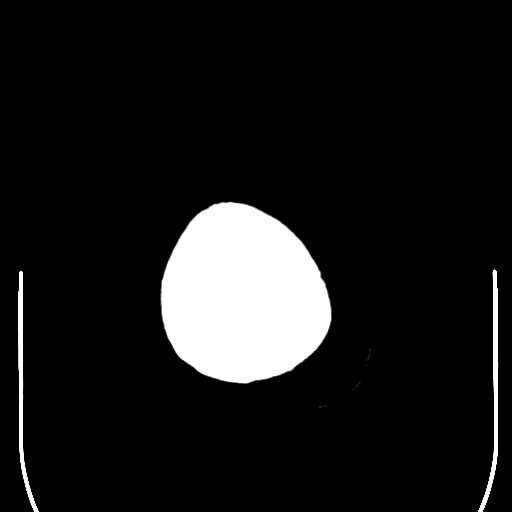
[im 29/32  bone]
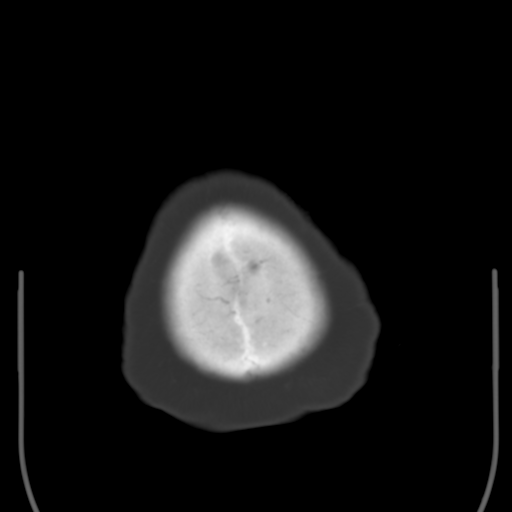

[Series 5: coronal soft tissue · coronal · 0.32mm/px · 3 of 73 slices shown]
[im 25/73  brain]
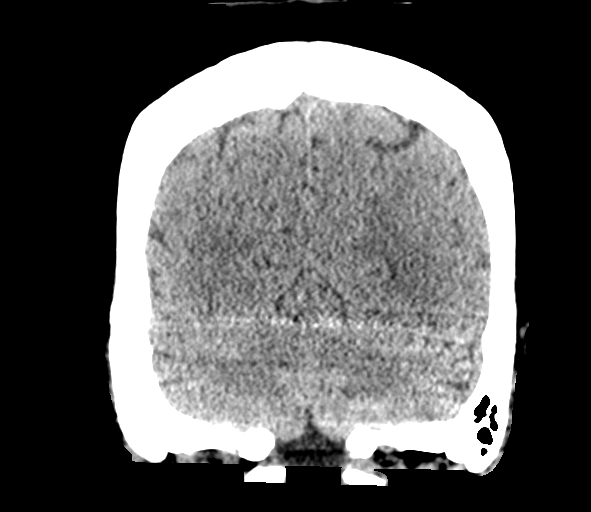
[im 33/73  brain]
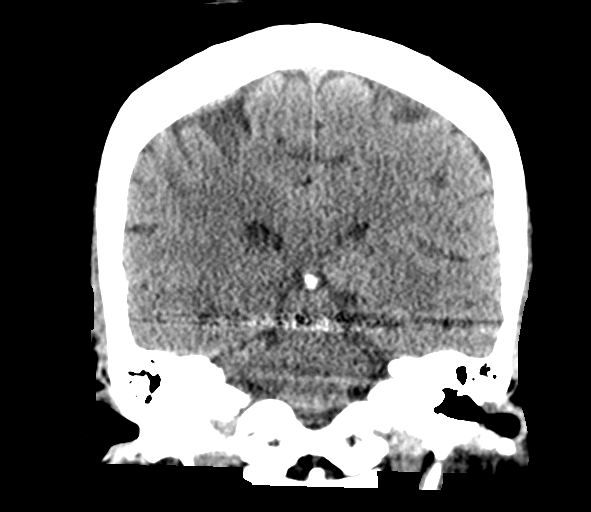
[im 41/73  brain]
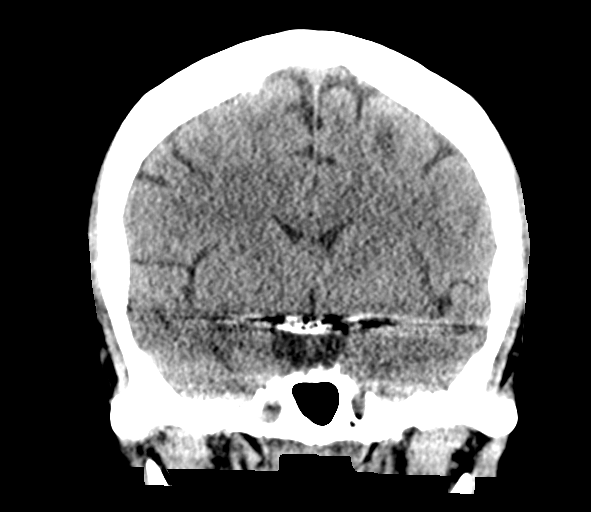

[Series 6: sagittal soft tissue · sagittal · 0.32mm/px · 3 of 63 slices shown]
[im 21/63  brain]
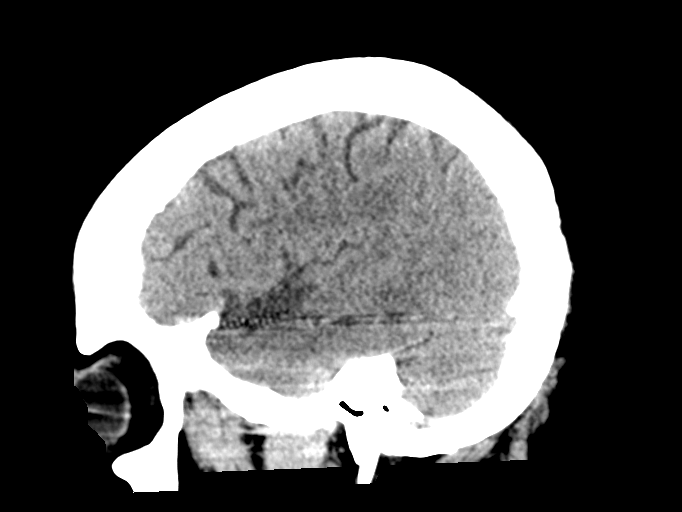
[im 32/63  brain]
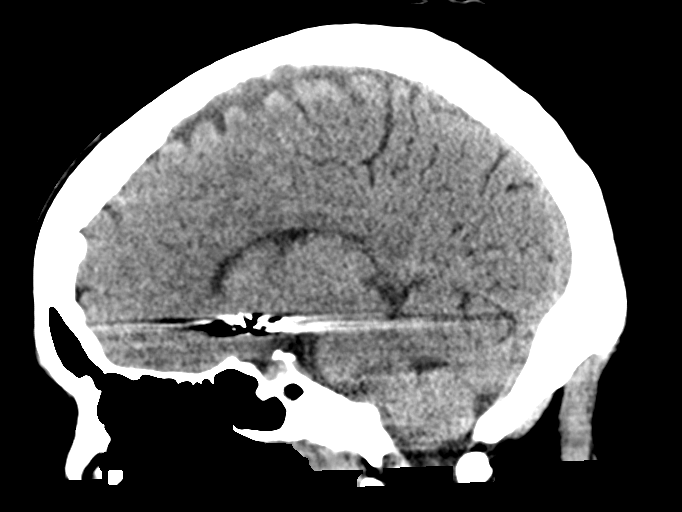
[im 42/63  brain]
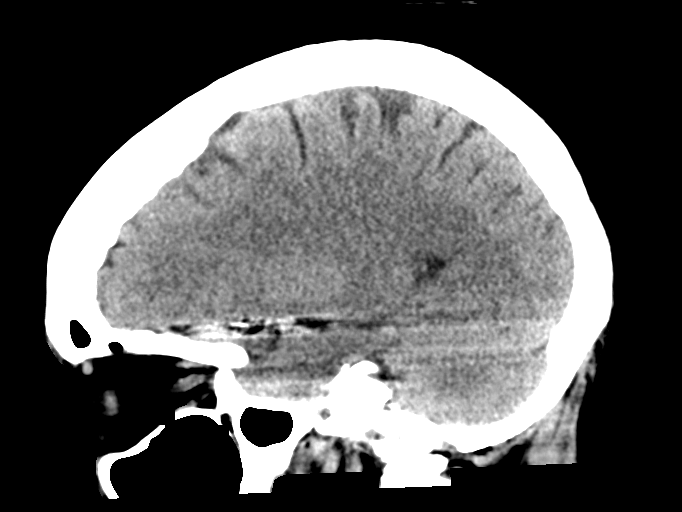

[15 of 47 positions shown; findings below may reference images not displayed]

FINDINGS: Brain: There is no mass, hemorrhage or extra-axial collection. The
size and configuration of the ventricles and extra-axial CSF spaces
are normal. The brain parenchyma is normal, without evidence of
acute or chronic infarction.

Vascular: Aneurysm clips at the A-comm.

Skull: The visualized skull base, calvarium and extracranial soft
tissues are normal.

Sinuses/Orbits: No fluid levels or advanced mucosal thickening of
the visualized paranasal sinuses. No mastoid or middle ear effusion.
The orbits are normal.

ASPECTS (Alberta Stroke Program Early CT Score)

- Ganglionic level infarction (caudate, lentiform nuclei, internal
capsule, insula, M1-M3 cortex): 7

- Supraganglionic infarction (M4-M6 cortex): 3

Total score (0-10 with 10 being normal): 10
IMPRESSION: 1. No acute intracranial abnormality.
2. ASPECTS is 10.

These results were called by telephone at the time of interpretation
on 08/31/2020 at [DATE] to provider RTOYOTA JOSHJAX , who verbally
acknowledged these results.

## 2022-02-12 IMAGING — MR MR HEAD W/O CM
10 series · 48 of 48 positions shown · non-contrast
Comparison: CTA head and neck yesterday and earlier. Brain MRI
08/13/2013.

CLINICAL DATA: 59-year-old female with lower extremity tingling.
Encephalopathy. Status post coil embolization of anterior
communicating artery aneurysm 1 week ago. 8 mm left MCA bifurcation
aneurysm.

EXAM:
MRI HEAD WITHOUT CONTRAST
TECHNIQUE: Multiplanar, multiecho pulse sequences of the brain and surrounding
structures were obtained without intravenous contrast.

[Series 5: DWI · axial · 3.0mm · 1.36mm/px · z∈[+4,+143]mm · 11 of 99 slices shown (1 of 4)]
[im 1/99]
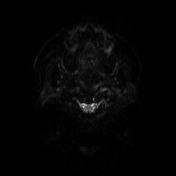
[im 10/99]
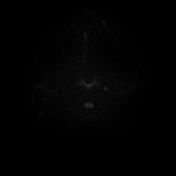
[im 20/99]
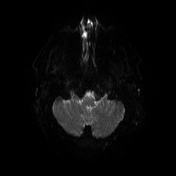
[im 30/99]
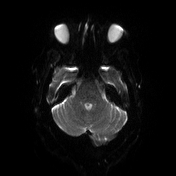
[im 40/99]
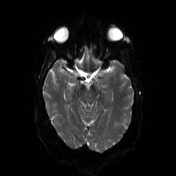
[im 50/99]
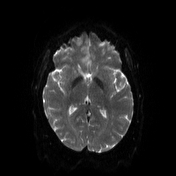
[im 59/99]
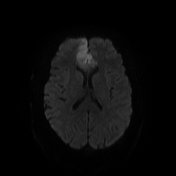
[im 69/99]
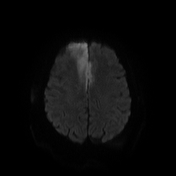
[im 79/99]
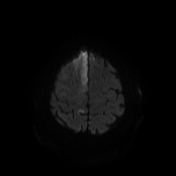
[im 89/99]
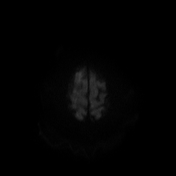
[im 99/99]
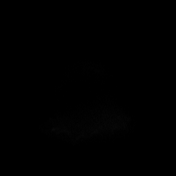

[Series 6: DWI · axial · 3.0mm · 1.36mm/px · z∈[+4,+143]mm · 5 of 50 slices shown (2 of 4)]
[im 1/50]
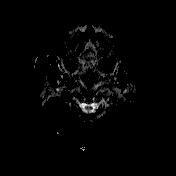
[im 13/50]
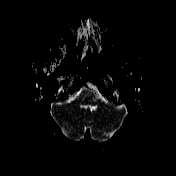
[im 25/50]
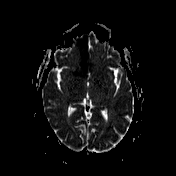
[im 37/50]
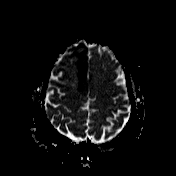
[im 50/50]
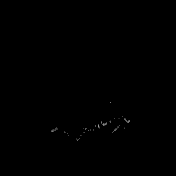

[Series 7: T1 · sagittal · 5.0mm · 0.75mm/px · 2 of 24 slices shown (1 of 2)]
[im 1/24]
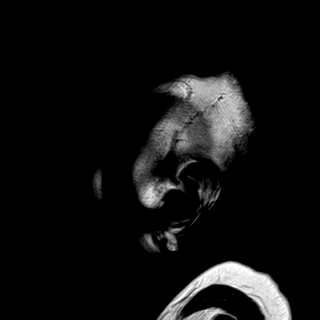
[im 24/24]
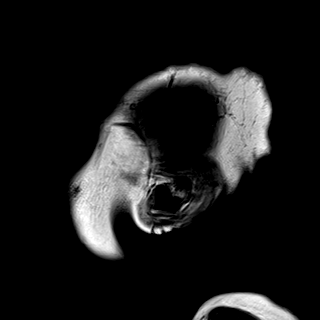

[Series 8: T2 · axial · 5.0mm · 0.62mm/px · z∈[-3,+150]mm · 3 of 26 slices shown (1 of 2)]
[im 1/26]
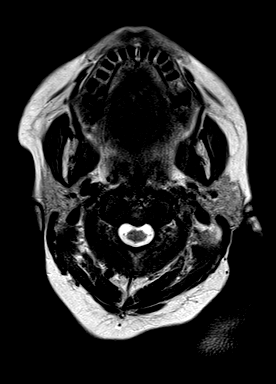
[im 13/26]
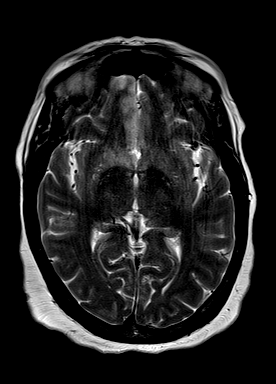
[im 26/26]
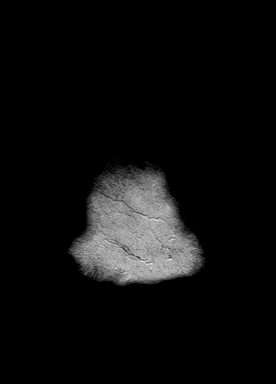

[Series 9: FLAIR · axial · 3.0mm · 0.86mm/px · z∈[+1,+143]mm · 5 of 51 slices shown]
[im 1/51]
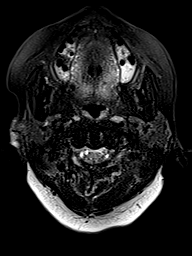
[im 13/51]
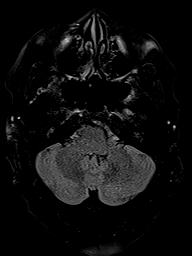
[im 26/51]
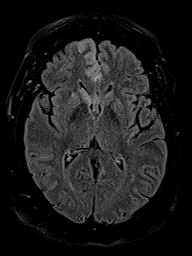
[im 38/51]
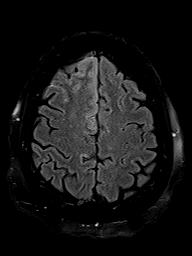
[im 51/51]
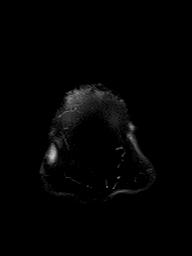

[Series 10: GRE · axial · 3.0mm · 0.45mm/px · z∈[-10,+135]mm · 5 of 51 slices shown]
[im 1/51]
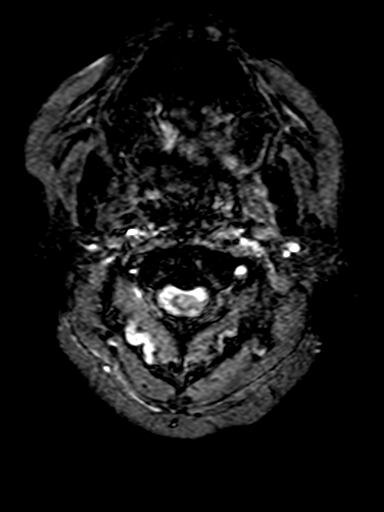
[im 13/51]
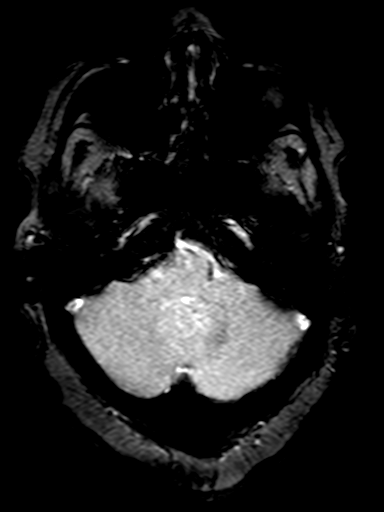
[im 26/51]
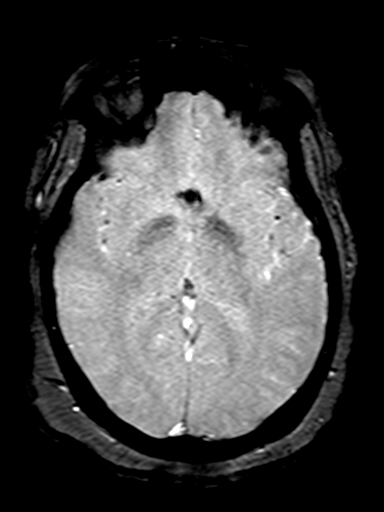
[im 38/51]
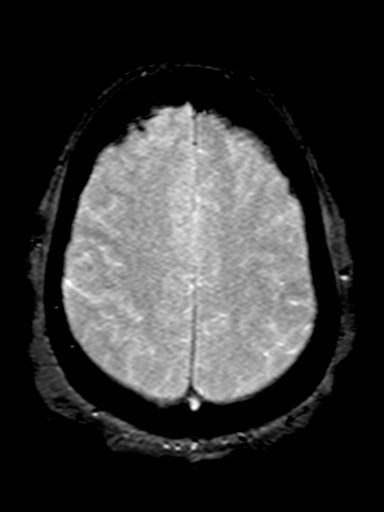
[im 51/51]
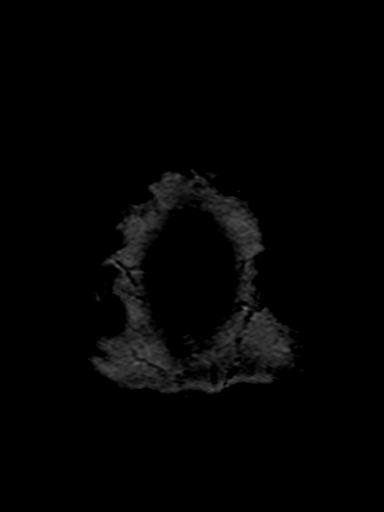

[Series 11: T1 · axial · 3.0mm · 0.45mm/px · z∈[-1,+146]mm · 5 of 53 slices shown (2 of 2)]
[im 1/53]
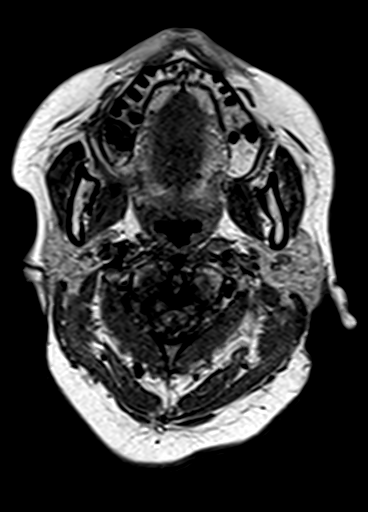
[im 14/53]
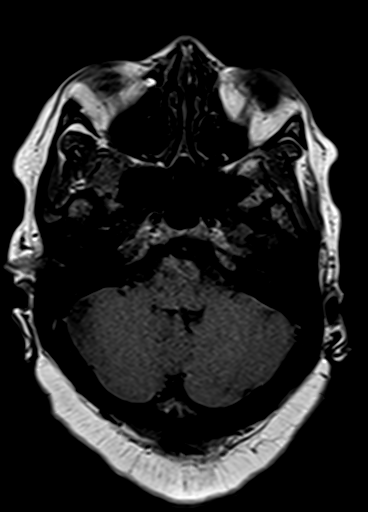
[im 27/53]
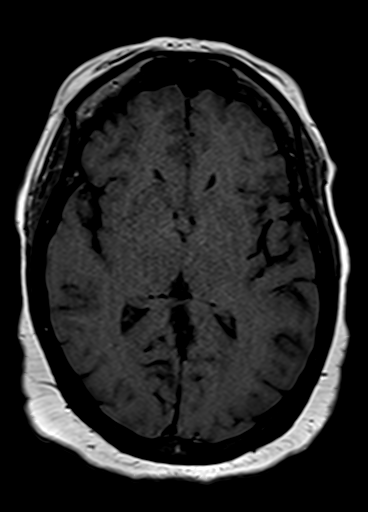
[im 40/53]
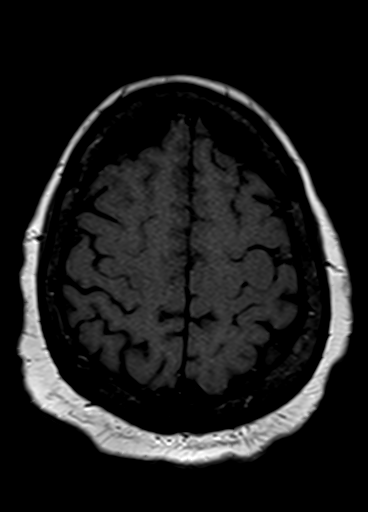
[im 53/53]
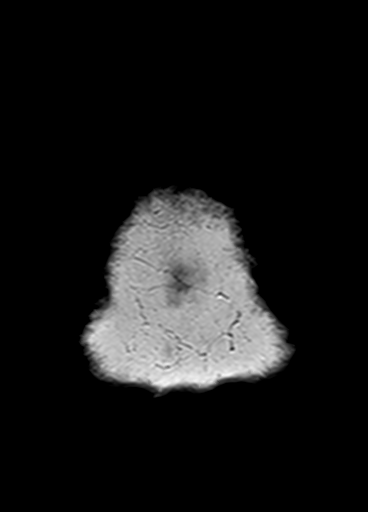

[Series 12: DWI · coronal · 5.0mm · 1.31mm/px · 6 of 56 slices shown (3 of 4)]
[im 1/56]
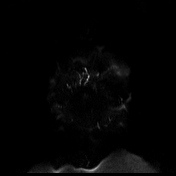
[im 12/56]
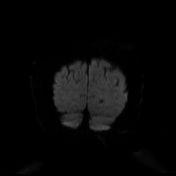
[im 23/56]
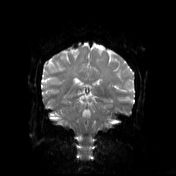
[im 34/56]
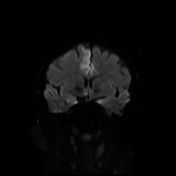
[im 45/56]
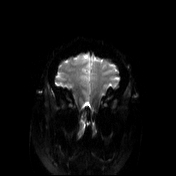
[im 56/56]
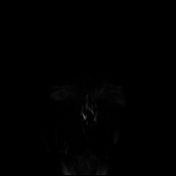

[Series 13: DWI · coronal · 5.0mm · 1.31mm/px · 3 of 28 slices shown (4 of 4)]
[im 1/28]
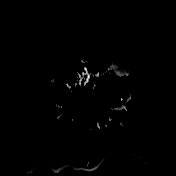
[im 14/28]
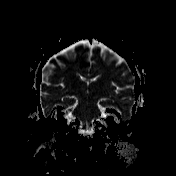
[im 28/28]
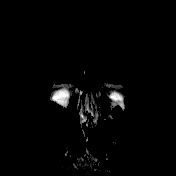

[Series 14: T2 · coronal · 5.0mm · 0.86mm/px · 3 of 26 slices shown (2 of 2)]
[im 1/26]
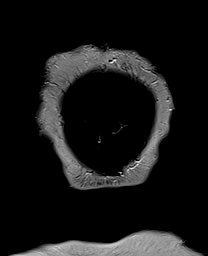
[im 13/26]
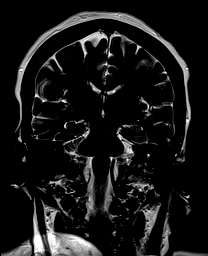
[im 26/26]
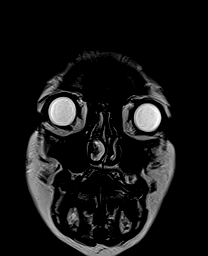

[48 of 48 positions shown; findings below may reference images not displayed]

FINDINGS: Brain: Confluent restricted diffusion in the right ACA territory
(series 5, image 87), but also affecting some cortex of the
cingulate gyrus on the left (series 12, image 48). And there is also
dense restricted diffusion in the anterior right basal ganglia
related to penetrating arteries of the proximal ACA. Posteriorly on
the right a small area of medial perirolandic cortex is also
affected (series 5, image 89).

Questionable also punctate abnormal diffusion in the right superior
cerebellum on series 5, image 68, but not correlated on the coronal
images. T2 and FLAIR hyperintensity in keeping with cytotoxic edema.
No parenchymal hemorrhage or significant mass effect.

No midline shift or ventriculomegaly. Small widely scattered
cerebral white matter T2 and FLAIR hyperintense foci are in a
similar pattern to the 2658 MRI, mildly progressed since that time.
No chronic cortical encephalomalacia or definite chronic cerebral
blood products. No midline shift, evidence of mass lesion,
extra-axial collection or acute intracranial hemorrhage. Basilar
cisterns are patent. Cervicomedullary junction and pituitary are
within normal limits.

Vascular: Major intracranial vascular flow voids are stable since
2658. T2* susceptibility artifact related to the anterior
communicating artery aneurysm stent assisted coiling.

Skull and upper cervical spine: Negative. Visualized bone marrow
signal is within normal limits.

Sinuses/Orbits: Negative. Paranasal Visualized paranasal sinuses and
mastoids are stable and well aerated.

Other: Negative visible scalp and face soft tissues.
IMPRESSION: 1. Confluent Acute Right ACA territory infarct. This includes
involvement of the anterior basal ganglia (medial striate artery
territory).
Ischemia also in the Left ACA territory but limited to cortex of the
cingulate gyrus.
2. Cytotoxic edema but no associated hemorrhage or significant mass
effect.

## 2022-03-08 ENCOUNTER — Encounter: Payer: Self-pay | Admitting: Physical Medicine & Rehabilitation

## 2022-03-08 ENCOUNTER — Encounter: Payer: Medicaid Other | Attending: Physical Medicine & Rehabilitation | Admitting: Physical Medicine & Rehabilitation

## 2022-03-08 VITALS — BP 111/77 | HR 88 | Ht 66.0 in | Wt 207.0 lb

## 2022-03-08 DIAGNOSIS — I69319 Unspecified symptoms and signs involving cognitive functions following cerebral infarction: Secondary | ICD-10-CM | POA: Diagnosis present

## 2022-03-08 DIAGNOSIS — F329 Major depressive disorder, single episode, unspecified: Secondary | ICD-10-CM

## 2022-03-08 DIAGNOSIS — Z8673 Personal history of transient ischemic attack (TIA), and cerebral infarction without residual deficits: Secondary | ICD-10-CM

## 2022-03-08 DIAGNOSIS — I63322 Cerebral infarction due to thrombosis of left anterior cerebral artery: Secondary | ICD-10-CM | POA: Diagnosis not present

## 2022-03-08 DIAGNOSIS — R41840 Attention and concentration deficit: Secondary | ICD-10-CM | POA: Diagnosis not present

## 2022-03-08 DIAGNOSIS — I63521 Cerebral infarction due to unspecified occlusion or stenosis of right anterior cerebral artery: Secondary | ICD-10-CM | POA: Diagnosis not present

## 2022-03-08 DIAGNOSIS — F17210 Nicotine dependence, cigarettes, uncomplicated: Secondary | ICD-10-CM

## 2022-03-08 NOTE — Patient Instructions (Signed)
ALWAYS FEEL FREE TO CALL OUR OFFICE WITH ANY PROBLEMS OR QUESTIONS ([phone])  **PLEASE NOTE** ALL MEDICATION REFILL REQUESTS (INCLUDING CONTROLLED SUBSTANCES) NEED TO BE MADE AT LEAST 7 DAYS PRIOR TO REFILL BEING DUE. ANY REFILL REQUESTS INSIDE THAT TIME FRAME MAY RESULT IN DELAYS IN RECEIVING YOUR PRESCRIPTION.     You may return to driving with the following limitations: day time driving only, during non-peak traffic times, maximum 20 minutes in one direction, avoid major roads or highways.   -don't drive if you're tired or sick.

## 2022-03-08 NOTE — Progress Notes (Signed)
Subjective:    Patient ID: Betty Archer, female    DOB: 24-Dec-1960, 62 y.o.   MRN: [id number]  HPI  Betty Archer is here in follow-up of her right and left ACA infarcts.  I last saw her in September. She does cleaning and house upkeep, and in general has been much more active than before. She still wants to drive. She has done some driving with her son, and he says she did very well. She stopped ritalin when last rx ran out. She has been doing the driving trials without the ritalin. Her mood is positive. She is off brillinta and on asa alone.     Right Now 0 My pain is  na  LOCATION OF PAIN  hand, leg  BOWEL Number of stools per week: 2 Oral laxative use No  Type of laxative . Enema or suppository use No  History of colostomy No  Incontinent No   BLADDER Normal In and out cath, frequency . Able to self cath . Bladder incontinence No  Frequent urination No  Leakage with coughing No  Difficulty starting stream No  Incomplete bladder emptying No    Mobility ability to climb steps?  yes do you drive?  no  Function retired  Neuro/Psych bowel control problems depression  Prior Studies Any changes since last visit?  no  Physicians involved in your care Any changes since last visit?  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    Employer: [employer]  Tobacco Use   Smoking status: Some Days    Packs/day: 0.50    Years: 27.00    Total pack years: 13.50    Types: Cigarettes   Smokeless  tobacco: Never   Tobacco comments:    11/02/20 one a day  Vaping Use   Vaping Use: Never used  Substance and Sexual Activity   Alcohol use: Yes    Alcohol/week: 0.0 standard drinks of alcohol    Comment:  patient is a social drinker   Drug use: No   Sexual activity: Not on file  Other Topics Concern   Not on file  Social History Narrative    She has 3 son who are healthy ,    11/02/20 lives with son, Betty Archer    worked as a Research officer, 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   Social Determinants of Health   Financial Resource Strain: Not on file  Food Insecurity: Not on file  Transportation Needs: Not on file  Physical Activity: Not on file  Stress: Not on file  Social Connections: Not on file   Past Surgical History:  Procedure Laterality Date   BREAST BIOPSY      Core biopsy done on April 2003   COLONOSCOPY     IR 3D INDEPENDENT WKST  08/25/2020   IR 3D INDEPENDENT WKST  08/25/2020   IR 3D INDEPENDENT WKST  01/17/2021   IR ANGIO EXTRACRAN SEL COM CAROTID INNOMINATE UNI R MOD SED  01/17/2021   IR ANGIO INTRA EXTRACRAN SEL INTERNAL CAROTID BILAT MOD SED  08/25/2020   IR ANGIO INTRA EXTRACRAN SEL INTERNAL CAROTID UNI L MOD SED  01/17/2021   IR ANGIO VERTEBRAL SEL VERTEBRAL BILAT MOD SED  08/25/2020   IR CT HEAD LTD  08/25/2020   IR CT HEAD LTD  01/17/2021   IR INTRA CRAN STENT  08/25/2020   IR TRANSCATH/EMBOLIZ  08/25/2020   IR TRANSCATH/EMBOLIZ  01/17/2021   IR US GUIDE VASC ACCESS RIGHT  08/25/2020   IR US GUIDE VASC ACCESS RIGHT  01/17/2021   PARTIAL HYSTERECTOMY   10/22/1999    Still has cervix   RADIOLOGY WITH ANESTHESIA N/A 08/25/2020   Procedure: IR WITH ANESTHESIA EMBOLIZATION;  Surgeon: Pedro Earls, MD;  Location: [place];  Service: Radiology;  Laterality: N/A;   RADIOLOGY WITH ANESTHESIA N/A 01/17/2021   Procedure: RADIOLOGY WITH ANESTHESIA  EMBOLIZATION;  Surgeon: Pedro Earls, MD;  Location: [place];  Service: Radiology;  Laterality: N/A;   TRIGGER  FINGER RELEASE Left 12/25/2013   Procedure: LEFT THUMB TRIGGER RELEASE ;  Surgeon: Leanora Cover, MD;  Location: [place];  Service: Orthopedics;  Laterality: Left;   Past Medical History:  Diagnosis Date   Brain aneurysm    Cerebral aneurysm    Cognitive communication deficit    Hemiparesis ([place])    Hemiplegia ([place])    Hyperlipidemia    Hyperplastic colon polyp    Internal hemorrhoids    Stroke ([place])    TIA (transient ischemic attack)    There were no vitals taken for this visit.  Opioid Risk Score:   Fall Risk Score:  `1  Depression screen [facility] 2/9     11/09/2021   10:05 AM 07/06/2021   10:06 AM 03/23/2021   11:15 AM 12/29/2020   10:49 AM 12/01/2020    9:49 AM 11/02/2020   11:41 AM 10/26/2020    9:44 AM  Depression screen PHQ 2/9  Decreased Interest 0 1 1 0 0 0 0  Down, Depressed, Hopeless 0 1 1 0 0 0 0  PHQ - 2 Score 0 2 2 0 0 0 0  Altered sleeping     0  3  Tired, decreased energy     1  3  Change in appetite     0  0  Feeling bad or failure about yourself      0  0  Trouble concentrating     2  0  Moving slowly or fidgety/restless     2  2  Suicidal thoughts     0  0  PHQ-9 Score     5  8  Difficult doing work/chores       Not difficult at all      Review of Systems  Constitutional:  Positive for diaphoresis.  Respiratory:  Positive for cough.   Gastrointestinal:  Positive for constipation.  All other systems reviewed and are negative.     Objective:   Physical Exam General: No acute distress HEENT: NCAT, EOMI, oral membranes moist Cards: reg rate  Chest: normal effort Abdomen: Soft, NT, ND Skin: dry, intact Extremities: no edema Psych: pleasant and appropriate  Skin: intact Neuro: More focused. Initiates more. Improved awareness.   LUE 4/5. LLE 4+/5.  Balance has improved.  She is walking without a device.  Musculoskeletal: No pain today with gait  or range of motion..            Assessment & Plan:  Medical Problem List and Plan: 1.   Altered mental status with decreased functional mobility/left lower extremity weakness secondary to large right ACA and small left ACA infarct likely related to recent ACOM aneurysm post coiling and stenting             -discussed driving today. She may return to driving with the following limitations: day time driving only, during non-peak traffic times, maximum 20 minutes in one direction, avoid major roads or highways.   -don't drive if you're tired or sick.  2. Stroke prophylaxis             -antiplatelet therapy: Brilinta stopped. aspirin 81 mg daily 3. Pain Management: Improved 4.  Tobacco use.  Tobacco cessation discussed. 5. Attention: this has improved  -will not resume ritalin 6.  Reactive depression:   Wellbutrin XR  300 mg daily has been effective.             - improved activity at home  -she works a lot home.    20+ minutes of face to face patient care time were spent during this visit. All questions were encouraged and answered. Follow up with me in 6 months.

## 2022-03-13 ENCOUNTER — Other Ambulatory Visit: Payer: Self-pay | Admitting: Physical Medicine & Rehabilitation

## 2022-03-13 ENCOUNTER — Other Ambulatory Visit: Payer: Self-pay

## 2022-03-13 DIAGNOSIS — E785 Hyperlipidemia, unspecified: Secondary | ICD-10-CM

## 2022-03-13 MED ORDER — ATORVASTATIN CALCIUM 80 MG PO TABS
80.0000 mg | ORAL_TABLET | Freq: Every day | ORAL | 0 refills | Status: DC
  Filled 2022-03-13: qty 90, 90d supply, fill #0
  Filled 2022-07-18: qty 30, 30d supply, fill #1

## 2022-03-26 ENCOUNTER — Other Ambulatory Visit: Payer: Self-pay | Admitting: Physical Medicine & Rehabilitation

## 2022-06-30 IMAGING — US IR TRANSCATH EMBOLIZATION
1 series · 1 of 1 positions shown · non-contrast
Comparison: Cerebral angiogram August 25, 2020.

INDICATION: Nn-Avto Kasamanli is a 60-year-old female with a past medical history
significant for HLD, tobacco use, TIA, right ACA aneurysm s/p stent
assisted embolization 08/25/20 and right ACA territory infarct
08/31/20. ACA infarct was initially felt to be related to in stent
thrombosis however she reported good compliance with Brilinta 90 mg
BID + ASA 81 mg QD and her p3y73 was found to be in therapeutic
range. Therefore, the possibility of hypercoagulable state related
to [id number] was raised. She made a great recovery and returns today
for elective treatment of her left MCA bifurcation aneurysm.

EXAM:
ULTRASOUND-GUIDED VASCULAR [REDACTED] CEREBRAL ANGIOGRAM
3D ROTATIONAL ANGIOGRAM
ENDOVASCULAR ANEURYSM EMBOLIZATION
FLAT PANEL HEAD CT
TECHNIQUE: Informed written consent was obtained from the patient after a
thorough discussion of the procedural risks, benefits and
alternatives. All questions were addressed.

[Series 1: ir (id) (id)/(id) · 1 of 1 slices shown]
[im 1/1]
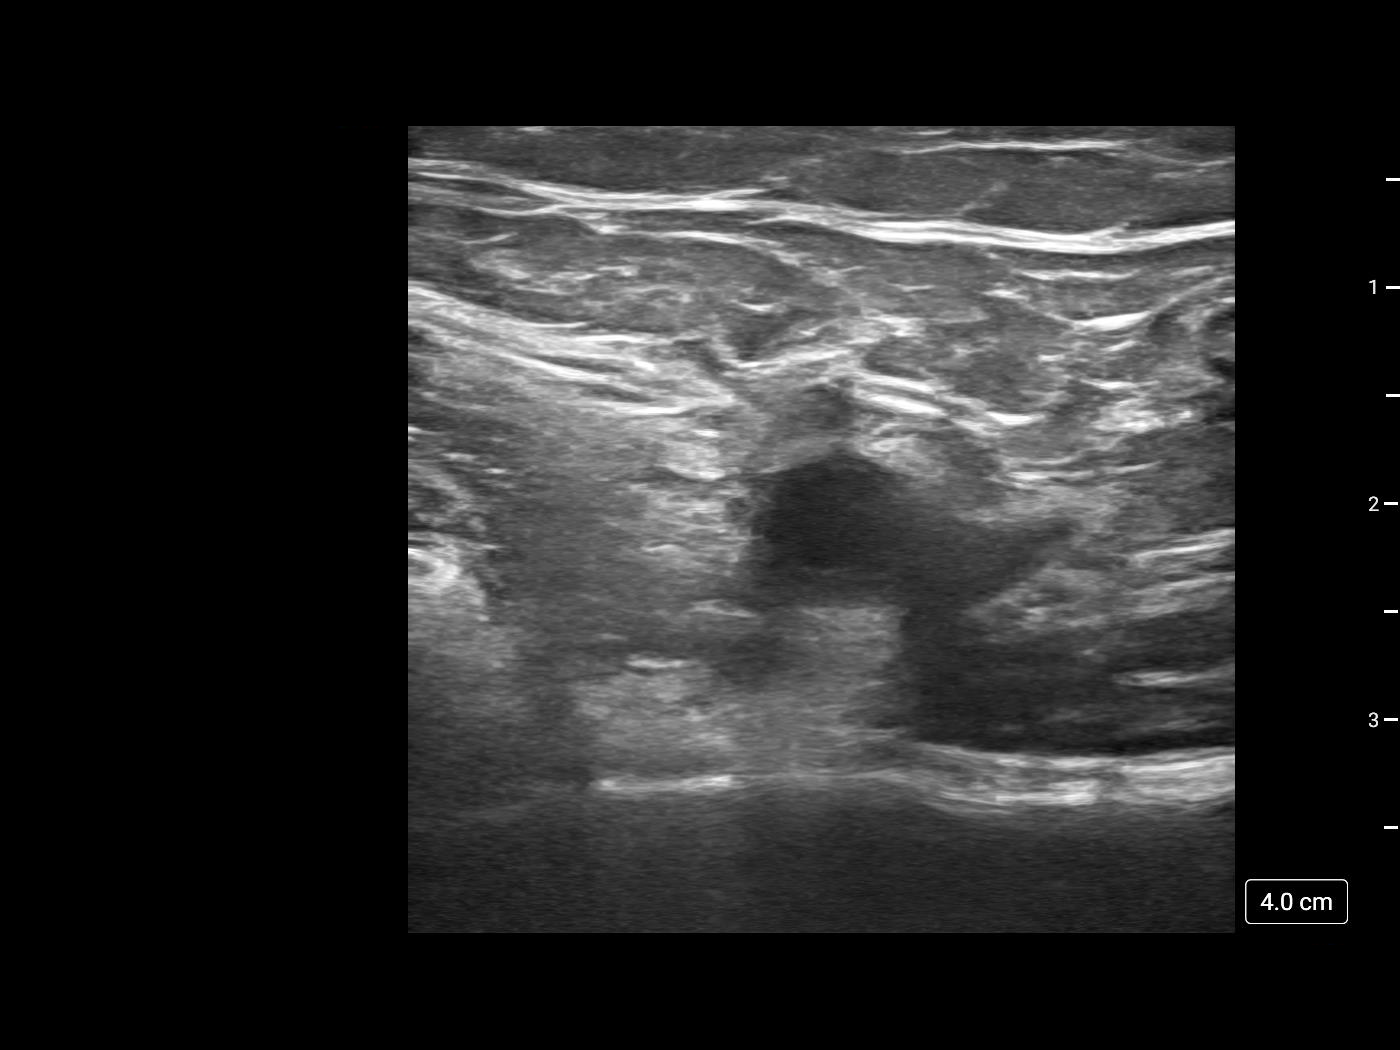

[1 of 1 positions shown; findings below may reference images not displayed]

MEDICATIONS:
Ancef 2g IV. The antibiotic was administered within 1 hour of the
procedure

ANESTHESIA/SEDATION:
The procedure was performed under general anesthesia.

CONTRAST:  120 mL Omnipaque 300 mg/mL

FLUOROSCOPY TIME:  Fluoroscopy Time: 34 minutes 30 seconds (5268
mGy).

COMPLICATIONS:
None immediate.
Maximal Sterile Barrier Technique was utilized including caps, mask,
sterile gowns, sterile gloves, sterile drape, hand hygiene and skin
antiseptic. A timeout was performed prior to the initiation of the
procedure.

The right groin was prepped and draped in the usual sterile fashion.
Using a micropuncture kit and the modified Seldinger technique,
access was gained to the right common femoral artery and an 8 French
sheath was placed. Real-time ultrasound guidance was utilized for
vascular access including the acquisition of a permanent ultrasound
image documenting patency of the accessed vessel.

Under fluoroscopy, a 6 Paulus N Ceejay 2 catheter was navigated
over a 0.035" Terumo Glidewire into the aortic arch. The catheter
was placed into the right common carotid artery and then advanced
into the right internal carotid artery under fluoroscopy guidance.
Frontal and lateral angiograms of the head were obtained followed by
magnified waters and magnified lateral views of the head, centered
on the anterior communicating artery.

The catheter was retracted and placed into the left carotid artery.
Frontal and lateral angiograms of the neck were obtained. Under
biplane roadmap the catheter advanced into the left internal carotid
artery. Frontal and lateral angiograms of the head were obtained. 3D
rotational angiograms were acquired and post processed in a separate
workstation under concurrent attending physician supervision.
Selected images were sent to PACS. Then, magnified left anterior
oblique and magnified lateral views of the head were obtained.
FINDINGS: 1. Patent right common femoral artery with adequate caliber for
vascular access.
2. Status post treatment of an anterior communicating artery
aneurysm with stent assisted coiling using "Y" stenting technique.
There is complete occlusion of the aneurysm. Intracranial stent
spanning the right A1 and bilateral A2 segments are patent with
brisk anterograde flow. No new aneurysm the right anterior
circulation identified.
3. Irregularly-shaped and multilobulated left MCA bifurcation
aneurysm identified, measuring approximately 7.6 mm height, 4.2 mm
transverse in the distal portion of the aneurysm and 5.5 mm
transverse in the proximal aspect of the aneurysm.

PROCEDURE:
Under roadmap, the Mamo Demessie 2 catheter was exchanged over the wire
for a trackstar guide catheter which was placed in the distal
cervical segment of the left ICA. Magnified left anterior oblique
and magnified lateral angiograms of the head were obtained in the
working projections.

Using biplane roadmap, Statybu Startas Metute EX distal access catheter was
navigated over a via 17 microcatheter and an Aristotle 14 micro
guidewire into the cavernous segment of the right ICA. The
microcatheter was then advanced over the microwire into the left MCA
bifurcation aneurysm pouch. Then, a 6 x 4 mm web device was deployed
into the aneurysm. Left internal carotid artery angiograms with
magnified left anterior oblique and magnified lateral views of the
head were obtained. Suboptimal coverage of the aneurysm base was
observed. The device was subsequently recaptured.

Next, a 7 x 4 mm web device was navigated and deployed within the
aneurysm pouch. Left internal carotid artery angiograms with
magnified left anterior oblique and magnified lateral views of the
head were obtained. Adequate aneurysm filling by the device was
noted. The device was subsequently detached. The microcatheter was
retrieved. Left internal carotid artery angiograms with magnified
left anterior oblique and magnified lateral views of the head were
obtained. Stable position of the device noted. Left internal carotid
artery angiograms with frontal and lateral views of the entire head
were then obtained. No evidence of thromboembolic complication seen.

Flat panel CT of the head was obtained and post processed in a
separate workstation with concurrent attending physician
supervision. Selected images were sent to PACS. No evidence of
hemorrhagic complication.

The catheter was subsequently withdrawn.

A right common femoral artery angiogram was obtained in right
anterior oblique view. The puncture is at the mid right common
femoral artery which has normal caliber, adequate for closure
device. The femoral sheath was exchanged over the wire for a
Perclose pro style which was utilized for access closure. Immediate
hemostasis was achieved.
IMPRESSION: 1. Successful endovascular treatment of a left MCA bifurcation
aneurysm with a web device with adequate aneurysm occlusion.
2. No evidence of hemorrhagic or thromboembolic complication.

PLAN:
1. Patient will continue on dual anti-platelet therapy due to
previously implanted bilateral anterior cerebral artery stents for
treatment of anterior communicating artery aneurysm.
2. An MR angiogram will be obtained in 6 months to evaluate
treatment outcome.

## 2022-07-18 ENCOUNTER — Other Ambulatory Visit: Payer: Self-pay

## 2022-09-06 ENCOUNTER — Ambulatory Visit
Admission: RE | Admit: 2022-09-06 | Discharge: 2022-09-06 | Disposition: A | Discharge status: Home / Self Care | Payer: Medicaid Other | Source: Ambulatory Visit | Attending: Physical Medicine & Rehabilitation | Admitting: Physical Medicine & Rehabilitation

## 2022-09-06 ENCOUNTER — Encounter: Payer: Self-pay | Admitting: Physical Medicine & Rehabilitation

## 2022-09-06 ENCOUNTER — Encounter: Payer: Medicaid Other | Attending: Physical Medicine & Rehabilitation | Admitting: Physical Medicine & Rehabilitation

## 2022-09-06 ENCOUNTER — Other Ambulatory Visit: Payer: Self-pay

## 2022-09-06 VITALS — BP 112/75 | HR 74 | Ht 66.0 in | Wt 217.0 lb

## 2022-09-06 DIAGNOSIS — M65319 Trigger thumb, unspecified thumb: Secondary | ICD-10-CM

## 2022-09-06 DIAGNOSIS — M180 Bilateral primary osteoarthritis of first carpometacarpal joints: Secondary | ICD-10-CM | POA: Diagnosis present

## 2022-09-06 DIAGNOSIS — Z8673 Personal history of transient ischemic attack (TIA), and cerebral infarction without residual deficits: Secondary | ICD-10-CM | POA: Insufficient documentation

## 2022-09-06 DIAGNOSIS — I69319 Unspecified symptoms and signs involving cognitive functions following cerebral infarction: Secondary | ICD-10-CM | POA: Diagnosis present

## 2022-09-06 MED ORDER — MELOXICAM 7.5 MG PO TABS
7.5000 mg | ORAL_TABLET | Freq: Every day | ORAL | 0 refills | Status: DC
  Filled 2022-09-06: qty 30, 30d supply, fill #0

## 2022-09-06 NOTE — Patient Instructions (Addendum)
ALWAYS FEEL FREE TO CALL OUR OFFICE WITH ANY PROBLEMS OR QUESTIONS [phone])  **PLEASE NOTE** ALL MEDICATION REFILL REQUESTS (INCLUDING CONTROLLED SUBSTANCES) NEED TO BE MADE AT LEAST 7 DAYS PRIOR TO REFILL BEING DUE. ANY REFILL REQUESTS INSIDE THAT TIME FRAME MAY RESULT IN DELAYS IN RECEIVING YOUR PRESCRIPTION.   WEAR YOUR WRIST BRACE THROUGHOUT THE DAY. YOU MAY TAKE BREAKS PERIODICIALLY  USE MELOXICAM DAILY FOR ONE MONTH   ICE TO WRIST 3 X DAILY (30 MINUTES)  XRAYS    REDUCE SALT IN YOUR DIET. LESS FRIED FOOD, LESS SNACK FOOD AND PROCESSED FOOD.  ' MORE FISH, BAKED CHIKEN, PORK TENDERLOIN, VEGETABLES AND FRUITS.

## 2022-09-06 NOTE — Progress Notes (Signed)
Subjective:    Patient ID: Betty Archer, female    DOB: 24-Sep-1960, 62 y.o.   MRN: [id number]  HPI  Mrs Nazario is here in follow up of her stroke. She tells me that things have been fairly quiet at home until the last couple weeks. She is here with her son today.    For the last 2 weeks she's had right hand pain over the thumb side. She has used ibuprofen for pain. Pain is worst when she picks things up and graps/pinches. She has had issues with the left hand in the past, and has had surgery.   She also reports swelling in her feet. I asked her what she eats and son says she eats fried chicken and there is a lot of salt in her food.   BP is controlled. She is off brilinta.      Pain Inventory Average Pain 7 Pain Right Now 6 My pain is sharp, tingling, and aching  LOCATION OF PAIN  shoulder  BOWEL Number of stools per week: 2  BLADDER Normal  Mobility walk without assistance ability to climb steps?  yes do you drive?  yes  Function disabled: date disabled .  Neuro/Psych weakness numbness tingling depression  Prior Studies Any changes since last visit?  yes she has stopped taking her ritalin  Physicians involved in your care Any changes since last visit?  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    Employer: [employer]  Tobacco Use   Smoking status: Some Days    Current packs/day: 0.50    Average packs/day: 0.5 packs/day for 27.0 years (13.5 ttl pk-yrs)    Types: Cigarettes    Smokeless tobacco: Never   Tobacco comments:    11/02/20 one a day  Vaping Use   Vaping status: Never Used  Substance and Sexual Activity   Alcohol use: Yes    Alcohol/week: 0.0 standard drinks of alcohol    Comment:  patient is a social drinker   Drug use: No   Sexual activity: Not on file  Other Topics Concern   Not on file  Social History Narrative    She has 3 son who are healthy ,    11/02/20 lives with son, [name]    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   Social Determinants of Health   Financial Resource Strain: Not on file  Food Insecurity: Not on file  Transportation Needs: Not on file  Physical Activity: Not on file  Stress: Not on file  Social Connections: Not on file   Past Surgical History:  Procedure Laterality Date   BREAST BIOPSY      Core biopsy done on April 2003   COLONOSCOPY     IR 3D INDEPENDENT WKST  08/25/2020   IR 3D INDEPENDENT WKST  08/25/2020   IR 3D INDEPENDENT WKST  01/17/2021   IR ANGIO EXTRACRAN SEL COM CAROTID INNOMINATE UNI R MOD SED  01/17/2021   IR ANGIO INTRA EXTRACRAN SEL INTERNAL CAROTID BILAT MOD SED  08/25/2020   IR ANGIO INTRA EXTRACRAN SEL INTERNAL CAROTID UNI L MOD SED  01/17/2021   IR ANGIO VERTEBRAL SEL VERTEBRAL BILAT MOD SED  08/25/2020   IR CT HEAD LTD  08/25/2020   IR CT HEAD LTD  01/17/2021   IR INTRA CRAN STENT  08/25/2020   IR TRANSCATH/EMBOLIZ  08/25/2020   IR TRANSCATH/EMBOLIZ  01/17/2021   IR US GUIDE VASC ACCESS RIGHT  08/25/2020   IR US GUIDE VASC ACCESS RIGHT  01/17/2021   PARTIAL HYSTERECTOMY   10/22/1999    Still has cervix   RADIOLOGY WITH ANESTHESIA N/A 08/25/2020   Procedure: IR WITH ANESTHESIA EMBOLIZATION;  Surgeon: Baldemar Lenis, MD;  Location: [facility] OR;  Service: Radiology;  Laterality: N/A;   RADIOLOGY WITH ANESTHESIA N/A 01/17/2021   Procedure: RADIOLOGY WITH ANESTHESIA  EMBOLIZATION;  Surgeon: Baldemar Lenis, MD;  Location: [facility] OR;  Service: Radiology;  Laterality: N/A;    TRIGGER FINGER RELEASE Left 12/25/2013   Procedure: LEFT THUMB TRIGGER RELEASE ;  Surgeon: Betha Loa, MD;  Location: [facility];  Service: Orthopedics;  Laterality: Left;   Past Medical History:  Diagnosis Date   Brain aneurysm    Cerebral aneurysm    Cognitive communication deficit    Hemiparesis ([facility])    Hemiplegia ([facility])    Hyperlipidemia    Hyperplastic colon polyp    Internal hemorrhoids    Stroke ([facility])    TIA (transient ischemic attack)    BP 112/75   Pulse 74   Ht 5\' 6"  (1.676 m)   Wt 217 lb (98.4 kg)   SpO2 95%   BMI 35.02 kg/m   Opioid Risk Score:   Fall Risk Score:  `1  Depression screen [facility] 2/9     09/06/2022    9:53 AM 11/09/2021   10:05 AM 07/06/2021   10:06 AM 03/23/2021   11:15 AM 12/29/2020   10:49 AM 12/01/2020    9:49 AM 11/02/2020   11:41 AM  Depression screen PHQ 2/9  Decreased Interest 0 0 1 1 0 0 0  Down, Depressed, Hopeless 0 0 1 1 0 0 0  PHQ - 2 Score 0 0 2 2 0 0 0  Altered sleeping      0   Tired, decreased energy      1   Change in appetite      0   Feeling bad or failure about yourself       0   Trouble concentrating      2   Moving slowly or fidgety/restless      2   Suicidal thoughts      0   PHQ-9 Score      5      Review of Systems  Constitutional:  Positive for diaphoresis and unexpected weight change.       Wt gain  HENT: Negative.    Eyes: Negative.   Respiratory: Negative.    Cardiovascular:  Positive for leg swelling.       Bilateral feet  Gastrointestinal:  Positive for constipation and nausea.  Endocrine: Negative.   Genitourinary: Negative.   Musculoskeletal:        Shoulder pain  Skin: Negative.   Allergic/Immunologic: Negative.   Neurological:  Positive for weakness and numbness.  Tingling  Hematological:  Bruises/bleeds easily.       Brilinta  Psychiatric/Behavioral:  Positive for dysphoric mood.   All other systems reviewed and are negative.      Objective:   Physical  Exam  General: No acute distress HEENT: NCAT, EOMI, oral membranes moist Cards: reg rate  Chest: normal effort Abdomen: Soft, NT, ND Skin: dry, intact Extremities: 1+ edema pedal, slightly in lower legs.  Psych: pleasant and appropriate  Skin: intact Neuro: fairly focused. Initiates more. Improved awareness.   LUE 4/5. LLE 4+/5.  Balance has improved.  She is walking without a device.   Musculoskeletal: left thumb and wrist sore with palpation. Finkelstein's test +. No frank swelling at joint. No wamrth.             Assessment & Plan:  Medical Problem List and Plan: 1.  Altered mental status with decreased functional mobility/left lower extremity weakness secondary to large right ACA and small left ACA infarct likely related to recent ACOM aneurysm post coiling and stenting             -local driving  -pt at baseline 2. Stroke prophylaxis             -antiplatelet therapy: Brilinta stopped. aspirin 81 mg daily 3. Pain Management: Improved 4.  Tobacco use.  Tobacco cessation discussed. 5. Attention: this has improved             -will not resume ritalin 6.  Reactive depression:   Wellbutrin XR  300 mg daily has been effective.             - improved activity at home             -she works a lot home.  7. Right hand pain.   -has hx of thumb/cmc arthritis?  -ordered xrays of wrist  -off brilinta, so ordered one month of meloxicam 7.5mg  daily  -ice  -wrist splint during day. 8. LE edema: -needs to make dietary changes -weight loss -discussed lower salt food options -elevate feet, compression stockings    20+ minutes of face to face patient care time were spent during this visit. All questions were encouraged and answered. Follow up with me in 2 months.

## 2022-09-07 ENCOUNTER — Other Ambulatory Visit: Payer: Self-pay

## 2022-09-07 ENCOUNTER — Other Ambulatory Visit: Payer: Self-pay | Admitting: Physical Medicine & Rehabilitation

## 2022-09-07 DIAGNOSIS — E785 Hyperlipidemia, unspecified: Secondary | ICD-10-CM

## 2022-09-08 ENCOUNTER — Other Ambulatory Visit: Payer: Self-pay

## 2022-09-11 ENCOUNTER — Telehealth: Payer: Self-pay | Admitting: Physical Medicine & Rehabilitation

## 2022-09-11 DIAGNOSIS — E785 Hyperlipidemia, unspecified: Secondary | ICD-10-CM

## 2022-09-11 NOTE — Telephone Encounter (Signed)
Patient needs a refill on Lipitor

## 2022-09-11 NOTE — Telephone Encounter (Signed)
ATC patient no vmail

## 2022-09-12 ENCOUNTER — Other Ambulatory Visit: Payer: Self-pay

## 2022-09-12 MED ORDER — ATORVASTATIN CALCIUM 80 MG PO TABS
80.0000 mg | ORAL_TABLET | Freq: Every day | ORAL | 3 refills | Status: DC
  Filled 2022-09-12: qty 90, 90d supply, fill #0
  Filled 2022-12-29: qty 90, 90d supply, fill #1
  Filled 2023-03-21: qty 90, 90d supply, fill #2

## 2022-09-12 NOTE — Telephone Encounter (Signed)
Rx written and sent to the pharmacy. Thanks!

## 2022-09-12 NOTE — Addendum Note (Signed)
Addended by: Faith Rogue T on: 09/12/2022 11:54 AM   Modules accepted: Orders

## 2022-09-15 ENCOUNTER — Other Ambulatory Visit: Payer: Self-pay

## 2022-09-22 ENCOUNTER — Other Ambulatory Visit: Payer: Self-pay

## 2022-09-22 ENCOUNTER — Emergency Department (HOSPITAL_COMMUNITY)
Admission: EM | Admit: 2022-09-22 | Discharge: 2022-09-22 | Disposition: A | Discharge status: Home / Self Care | Payer: Medicaid Other | Attending: Emergency Medicine | Admitting: Emergency Medicine

## 2022-09-22 ENCOUNTER — Emergency Department (HOSPITAL_COMMUNITY): Payer: Medicaid Other

## 2022-09-22 ENCOUNTER — Encounter (HOSPITAL_COMMUNITY): Payer: Self-pay

## 2022-09-22 DIAGNOSIS — Z8673 Personal history of transient ischemic attack (TIA), and cerebral infarction without residual deficits: Secondary | ICD-10-CM | POA: Diagnosis not present

## 2022-09-22 DIAGNOSIS — Z7982 Long term (current) use of aspirin: Secondary | ICD-10-CM | POA: Diagnosis not present

## 2022-09-22 DIAGNOSIS — R569 Unspecified convulsions: Secondary | ICD-10-CM | POA: Diagnosis present

## 2022-09-22 LAB — CBG MONITORING, ED
Glucose-Capillary: 83 mg/dL (ref 70–99)
Glucose-Capillary: 62 mg/dL — ABNORMAL LOW (ref 70–99)
Glucose-Capillary: 108 mg/dL — ABNORMAL HIGH (ref 70–99)
Glucose-Capillary: 98 mg/dL (ref 70–99)

## 2022-09-22 LAB — CBC WITH DIFFERENTIAL/PLATELET
Abs Immature Granulocytes: 0.03 10*3/uL (ref 0.00–0.07)
Basophils Absolute: 0 10*3/uL (ref 0.0–0.1)
Basophils Relative: 0 %
Eosinophils Absolute: 0.1 10*3/uL (ref 0.0–0.5)
Eosinophils Relative: 2 %
HCT: 38.5 % (ref 36.0–46.0)
Hemoglobin: 11.6 g/dL — ABNORMAL LOW (ref 12.0–15.0)
Immature Granulocytes: 0 %
Lymphocytes Relative: 21 %
Lymphs Abs: 1.6 10*3/uL (ref 0.7–4.0)
MCH: 23.4 pg — ABNORMAL LOW (ref 26.0–34.0)
MCHC: 30.1 g/dL (ref 30.0–36.0)
MCV: 77.6 fL — ABNORMAL LOW (ref 80.0–100.0)
Monocytes Absolute: 0.5 10*3/uL (ref 0.1–1.0)
Monocytes Relative: 6 %
Neutro Abs: 5.3 10*3/uL (ref 1.7–7.7)
Neutrophils Relative %: 71 %
Platelets: 205 10*3/uL (ref 150–400)
RBC: 4.96 MIL/uL (ref 3.87–5.11)
RDW: 15.9 % — ABNORMAL HIGH (ref 11.5–15.5)
WBC: 7.6 10*3/uL (ref 4.0–10.5)
nRBC: 0 % (ref 0.0–0.2)

## 2022-09-22 LAB — COMPREHENSIVE METABOLIC PANEL
ALT: 22 U/L (ref 0–44)
AST: 22 U/L (ref 15–41)
Albumin: 3.3 g/dL — ABNORMAL LOW (ref 3.5–5.0)
Alkaline Phosphatase: 93 U/L (ref 38–126)
Anion gap: 9 (ref 5–15)
BUN: 9 mg/dL (ref 8–23)
CO2: 21 mmol/L — ABNORMAL LOW (ref 22–32)
Calcium: 8.6 mg/dL — ABNORMAL LOW (ref 8.9–10.3)
Chloride: 110 mmol/L (ref 98–111)
Creatinine, Ser: 1.06 mg/dL — ABNORMAL HIGH (ref 0.44–1.00)
GFR, Estimated: 59 mL/min — ABNORMAL LOW (ref >60)
Glucose, Bld: 74 mg/dL (ref 70–99)
Potassium: 4.2 mmol/L (ref 3.5–5.1)
Sodium: 140 mmol/L (ref 135–145)
Total Bilirubin: 0.4 mg/dL (ref 0.3–1.2)
Total Protein: 6.8 g/dL (ref 6.5–8.1)

## 2022-09-22 LAB — URINALYSIS, ROUTINE W REFLEX MICROSCOPIC
Bilirubin Urine: NEGATIVE
Glucose, UA: NEGATIVE mg/dL
Hgb urine dipstick: NEGATIVE
Ketones, ur: NEGATIVE mg/dL
Leukocytes,Ua: NEGATIVE
Nitrite: NEGATIVE
Protein, ur: NEGATIVE mg/dL
Specific Gravity, Urine: 1.008 (ref 1.005–1.030)
pH: 6 (ref 5.0–8.0)

## 2022-09-22 LAB — MAGNESIUM: Magnesium: 2.2 mg/dL (ref 1.7–2.4)

## 2022-09-22 LAB — TROPONIN I (HIGH SENSITIVITY): Troponin I (High Sensitivity): 7 ng/L (ref <18)

## 2022-09-22 MED ORDER — SODIUM CHLORIDE 0.9 % IV SOLN
20.0000 mg/kg | Freq: Once | INTRAVENOUS | Status: DC
Start: 2022-09-22 — End: 2022-09-22

## 2022-09-22 MED ORDER — LEVETIRACETAM 500 MG PO TABS: 500.0000 mg | ORAL_TABLET | Freq: Two times a day (BID) | ORAL | 0 refills | Status: DC

## 2022-09-22 MED ORDER — IOHEXOL 350 MG/ML SOLN
75.0000 mL | Freq: Once | INTRAVENOUS | Status: AC | PRN
  Administered 2022-09-22: 75 mL via INTRAVENOUS

## 2022-09-22 MED ORDER — LEVETIRACETAM IN NACL 1000 MG/100ML IV SOLN
1000.0000 mg | Freq: Once | INTRAVENOUS | Status: AC
  Administered 2022-09-22: 1000 mg via INTRAVENOUS
  Filled 2022-09-22: qty 100

## 2022-09-22 NOTE — ED Notes (Signed)
The pt just returned from  c-t 

## 2022-09-22 NOTE — Discharge Instructions (Addendum)
Your history, exam, and evaluation today are consistent with a first-time seizure like related to your previous brain injury from stroke.  The CT imaging did not show concerning changes compared to prior today and neurology felt you are safe for discharge home after getting the seizure medicine loaded.  They would like you to start the Keppra 500 mg twice a day and follow-up with your neurology team this week.  Please rest and stay hydrated.  If any symptoms change or worsen acutely, please return to the nearest emergency department.

## 2022-09-22 NOTE — ED Provider Notes (Signed)
4:11 PM Care assumed from Dr. Theresia Lo.  At time of transfer of care, patient is awaiting for results of lab testing, head imaging, and reassessment to determine the seizure versus syncope.  Due to the high concern for seizure per EMS report and some degree of tongue injury, anticipate discussion with neurology given her neurologic history and her lack of any seizure history.  Anticipate reassessment after workup.  Patient was reporting she was having some headache to nursing so we will add on the CTA of the head given her history of intracranial aneurysms and repair in the setting of this somnolence.  CT/CTA did not show concerning change compared to prior.  I spoke to neurology who did not feel she needs MRI or EEG at this time but recommended loading with Keppra with 2 g and then started on 500 twice daily tomorrow onward.  He feels she does not need admission and feels she is safe for discharge home to follow-up with outpatient neurology team.  Patient agrees and was here for over 7 hours without recurrent seizure.  She is resting and feeling well and was able to eat and drink.  Given improvement in symptoms, patient be discharged for outpatient follow-up.   Clinical Impression: 1. Seizure [facility])     Disposition: Discharge  Condition: Good  I have discussed the results, Dx and Tx plan with the pt(& family if present). He/she/they expressed understanding and agree(s) with the plan. Discharge instructions discussed at great length. Strict return precautions discussed and pt &/or family have verbalized understanding of the instructions. No further questions at time of discharge.    New Prescriptions   LEVETIRACETAM (KEPPRA) 500 MG TABLET    Take 1 tablet (500 mg total) by mouth 2 (two) times daily.    Follow Up: Verlee Rossetti, PA-C [id number] [place] [address] [phone]     your neurologist        Chelisa Hennen, Canary Brim, MD 09/22/22 2239
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
## 2022-09-22 NOTE — ED Notes (Signed)
The pt has lac to her tongue and she may have been incontinent of urine she had on a wet breif

## 2022-09-22 NOTE — ED Notes (Signed)
The pt calls out for  a need to unriate  then she voids before I can walk right in
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
## 2022-09-22 NOTE — ED Notes (Signed)
Now has been informed that patient was registered under the wrong name.  EMS provided wrong name and dob

## 2022-09-22 NOTE — ED Notes (Signed)
The pt given a sandwich with orange juice after she passed her swallow screen  the pt remains alert no distress

## 2022-09-23 ENCOUNTER — Other Ambulatory Visit: Payer: Self-pay | Admitting: Physical Medicine & Rehabilitation

## 2022-09-23 DIAGNOSIS — F329 Major depressive disorder, single episode, unspecified: Secondary | ICD-10-CM

## 2022-09-23 DIAGNOSIS — I63529 Cerebral infarction due to unspecified occlusion or stenosis of unspecified anterior cerebral artery: Secondary | ICD-10-CM

## 2022-09-26 ENCOUNTER — Telehealth: Payer: Self-pay | Admitting: *Deleted

## 2022-09-26 NOTE — Telephone Encounter (Signed)
Mr Dimarco called and said that Betty Archer had a seizure on 09/22/22 and the son is looking for information from Betty Archer on what will be the plan going forward. I have let him know that Betty Archer is out of the office until next Wednesday and will discuss at that time.

## 2022-10-04 ENCOUNTER — Telehealth: Payer: Self-pay

## 2022-10-04 DIAGNOSIS — Z8673 Personal history of transient ischemic attack (TIA), and cerebral infarction without residual deficits: Secondary | ICD-10-CM

## 2022-10-04 DIAGNOSIS — G40909 Epilepsy, unspecified, not intractable, without status epilepticus: Secondary | ICD-10-CM

## 2022-10-04 NOTE — Telephone Encounter (Signed)
Betty Archer has requested a call back as soon as possible. He has been given [facility] Phone number to call and schedule an appointment.

## 2022-10-05 ENCOUNTER — Other Ambulatory Visit (HOSPITAL_COMMUNITY): Payer: Self-pay | Admitting: Neuroradiology

## 2022-10-05 DIAGNOSIS — I671 Cerebral aneurysm, nonruptured: Secondary | ICD-10-CM

## 2022-10-05 NOTE — Addendum Note (Signed)
Addended by: Faith Rogue T on: 10/05/2022 10:09 AM   Modules accepted: Orders

## 2022-10-05 NOTE — Telephone Encounter (Signed)
I talked with son and made a referral to [facility] for f/u re: seizure. He was instructed to contact us if he doesn't hear anything from neurology over the next few days.

## 2022-10-11 ENCOUNTER — Ambulatory Visit (HOSPITAL_COMMUNITY)
Admission: RE | Admit: 2022-10-11 | Discharge: 2022-10-11 | Disposition: A | Discharge status: Home / Self Care | Payer: Medicaid Other | Source: Ambulatory Visit | Attending: Neuroradiology | Admitting: Neuroradiology

## 2022-10-11 DIAGNOSIS — I671 Cerebral aneurysm, nonruptured: Secondary | ICD-10-CM | POA: Insufficient documentation

## 2022-10-24 ENCOUNTER — Telehealth (HOSPITAL_COMMUNITY): Payer: Self-pay

## 2022-10-24 NOTE — Telephone Encounter (Signed)
Called pt regarding recent imaging, no answer, no vm. AB 

## 2022-10-27 ENCOUNTER — Telehealth (HOSPITAL_COMMUNITY): Payer: Self-pay

## 2022-10-27 NOTE — Telephone Encounter (Signed)
Pt agreed to f/u in 3 years with an mra. AB

## 2022-10-29 ENCOUNTER — Emergency Department (HOSPITAL_COMMUNITY): Payer: Medicaid Other

## 2022-10-29 ENCOUNTER — Inpatient Hospital Stay (HOSPITAL_COMMUNITY)
Admission: EM | Admit: 2022-10-29 | Discharge: 2022-11-02 | DRG: 100 | Disposition: A | Discharge status: Home Health | Payer: Medicaid Other | Attending: Internal Medicine | Admitting: Internal Medicine

## 2022-10-29 DIAGNOSIS — Z803 Family history of malignant neoplasm of breast: Secondary | ICD-10-CM | POA: Diagnosis not present

## 2022-10-29 DIAGNOSIS — Z7982 Long term (current) use of aspirin: Secondary | ICD-10-CM | POA: Diagnosis not present

## 2022-10-29 DIAGNOSIS — Z7902 Long term (current) use of antithrombotics/antiplatelets: Secondary | ICD-10-CM

## 2022-10-29 DIAGNOSIS — E162 Hypoglycemia, unspecified: Secondary | ICD-10-CM | POA: Diagnosis not present

## 2022-10-29 DIAGNOSIS — Z8673 Personal history of transient ischemic attack (TIA), and cerebral infarction without residual deficits: Secondary | ICD-10-CM | POA: Diagnosis not present

## 2022-10-29 DIAGNOSIS — E785 Hyperlipidemia, unspecified: Secondary | ICD-10-CM | POA: Diagnosis present

## 2022-10-29 DIAGNOSIS — Z791 Long term (current) use of non-steroidal anti-inflammatories (NSAID): Secondary | ICD-10-CM

## 2022-10-29 DIAGNOSIS — J9601 Acute respiratory failure with hypoxia: Secondary | ICD-10-CM | POA: Diagnosis present

## 2022-10-29 DIAGNOSIS — R0603 Acute respiratory distress: Secondary | ICD-10-CM

## 2022-10-29 DIAGNOSIS — F32A Depression, unspecified: Secondary | ICD-10-CM | POA: Diagnosis present

## 2022-10-29 DIAGNOSIS — Z8249 Family history of ischemic heart disease and other diseases of the circulatory system: Secondary | ICD-10-CM

## 2022-10-29 DIAGNOSIS — Z1152 Encounter for screening for COVID-19: Secondary | ICD-10-CM | POA: Diagnosis not present

## 2022-10-29 DIAGNOSIS — Z6837 Body mass index (BMI) 37.0-37.9, adult: Secondary | ICD-10-CM

## 2022-10-29 DIAGNOSIS — Z781 Physical restraint status: Secondary | ICD-10-CM

## 2022-10-29 DIAGNOSIS — R41841 Cognitive communication deficit: Secondary | ICD-10-CM | POA: Diagnosis present

## 2022-10-29 DIAGNOSIS — Z23 Encounter for immunization: Secondary | ICD-10-CM

## 2022-10-29 DIAGNOSIS — E876 Hypokalemia: Secondary | ICD-10-CM | POA: Diagnosis present

## 2022-10-29 DIAGNOSIS — Z90711 Acquired absence of uterus with remaining cervical stump: Secondary | ICD-10-CM

## 2022-10-29 DIAGNOSIS — E669 Obesity, unspecified: Secondary | ICD-10-CM | POA: Diagnosis present

## 2022-10-29 DIAGNOSIS — I10 Essential (primary) hypertension: Secondary | ICD-10-CM | POA: Diagnosis present

## 2022-10-29 DIAGNOSIS — Z79899 Other long term (current) drug therapy: Secondary | ICD-10-CM

## 2022-10-29 DIAGNOSIS — F909 Attention-deficit hyperactivity disorder, unspecified type: Secondary | ICD-10-CM | POA: Diagnosis present

## 2022-10-29 DIAGNOSIS — R4182 Altered mental status, unspecified: Principal | ICD-10-CM

## 2022-10-29 DIAGNOSIS — Z8679 Personal history of other diseases of the circulatory system: Secondary | ICD-10-CM

## 2022-10-29 DIAGNOSIS — Z801 Family history of malignant neoplasm of trachea, bronchus and lung: Secondary | ICD-10-CM

## 2022-10-29 DIAGNOSIS — F17210 Nicotine dependence, cigarettes, uncomplicated: Secondary | ICD-10-CM | POA: Diagnosis present

## 2022-10-29 DIAGNOSIS — R569 Unspecified convulsions: Secondary | ICD-10-CM

## 2022-10-29 DIAGNOSIS — Z8719 Personal history of other diseases of the digestive system: Secondary | ICD-10-CM

## 2022-10-29 DIAGNOSIS — G40901 Epilepsy, unspecified, not intractable, with status epilepticus: Principal | ICD-10-CM | POA: Diagnosis present

## 2022-10-29 DIAGNOSIS — F419 Anxiety disorder, unspecified: Secondary | ICD-10-CM | POA: Diagnosis present

## 2022-10-29 LAB — CBG MONITORING, ED: Glucose-Capillary: 107 mg/dL — ABNORMAL HIGH (ref 70–99)

## 2022-10-29 MED ORDER — ETOMIDATE 2 MG/ML IV SOLN
INTRAVENOUS | Status: AC
  Filled 2022-10-29: qty 20

## 2022-10-29 MED ORDER — POLYETHYLENE GLYCOL 3350 17 G PO PACK: 17.0000 g | PACK | Freq: Every day | ORAL | Status: DC | PRN

## 2022-10-29 MED ORDER — INSULIN ASPART 100 UNIT/ML IJ SOLN: 0.0000 [IU] | INTRAMUSCULAR | Status: DC

## 2022-10-29 MED ORDER — ETOMIDATE 2 MG/ML IV SOLN
INTRAVENOUS | Status: DC | PRN
  Administered 2022-10-29: 20 mg via INTRAVENOUS

## 2022-10-29 MED ORDER — DOCUSATE SODIUM 50 MG/5ML PO LIQD
100.0000 mg | Freq: Two times a day (BID) | ORAL | Status: DC
  Administered 2022-10-30 (×2): 100 mg
  Filled 2022-10-29 (×2): qty 10

## 2022-10-29 MED ORDER — FENTANYL 2500MCG IN NS 250ML (10MCG/ML) PREMIX INFUSION
50.0000 ug/h | INTRAVENOUS | Status: DC
  Administered 2022-10-29: 50 ug/h via INTRAVENOUS
  Administered 2022-10-30: 100 ug/h via INTRAVENOUS
  Filled 2022-10-29 (×2): qty 250

## 2022-10-29 MED ORDER — FENTANYL CITRATE PF 50 MCG/ML IJ SOSY
PREFILLED_SYRINGE | INTRAMUSCULAR | Status: DC | PRN
  Administered 2022-10-29: 50 ug via INTRAVENOUS

## 2022-10-29 MED ORDER — DOCUSATE SODIUM 50 MG/5ML PO LIQD: 100.0000 mg | Freq: Two times a day (BID) | ORAL | Status: DC | PRN

## 2022-10-29 MED ORDER — FENTANYL CITRATE PF 50 MCG/ML IJ SOSY: 50.0000 ug | PREFILLED_SYRINGE | Freq: Once | INTRAMUSCULAR | Status: DC

## 2022-10-29 MED ORDER — PROPOFOL 1000 MG/100ML IV EMUL
INTRAVENOUS | Status: AC
  Administered 2022-10-29: 10 ug/kg/min via INTRAVENOUS
  Filled 2022-10-29: qty 100

## 2022-10-29 MED ORDER — POLYETHYLENE GLYCOL 3350 17 G PO PACK
17.0000 g | PACK | Freq: Every day | ORAL | Status: DC
  Administered 2022-10-30: 17 g
  Filled 2022-10-29: qty 1

## 2022-10-29 MED ORDER — SODIUM CHLORIDE 0.9 % IV SOLN: 2000.0000 mg | Freq: Once | INTRAVENOUS | Status: DC

## 2022-10-29 MED ORDER — FENTANYL BOLUS VIA INFUSION
50.0000 ug | INTRAVENOUS | Status: DC | PRN
  Administered 2022-10-30 (×3): 100 ug via INTRAVENOUS

## 2022-10-29 MED ORDER — FENTANYL CITRATE PF 50 MCG/ML IJ SOSY
PREFILLED_SYRINGE | INTRAMUSCULAR | Status: AC
  Filled 2022-10-29: qty 2

## 2022-10-29 MED ORDER — LEVETIRACETAM IN NACL 1000 MG/100ML IV SOLN
1000.0000 mg | INTRAVENOUS | Status: AC
  Administered 2022-10-29 – 2022-10-30 (×2): 1000 mg via INTRAVENOUS
  Filled 2022-10-29: qty 100

## 2022-10-29 MED ORDER — ROCURONIUM BROMIDE 10 MG/ML (PF) SYRINGE
PREFILLED_SYRINGE | INTRAVENOUS | Status: AC
  Filled 2022-10-29: qty 10

## 2022-10-29 MED ORDER — PROPOFOL 1000 MG/100ML IV EMUL: 0.0000 ug/kg/min | INTRAVENOUS | Status: DC

## 2022-10-29 MED ORDER — ROCURONIUM BROMIDE 50 MG/5ML IV SOLN
INTRAVENOUS | Status: DC | PRN
Start: 2022-10-29 — End: 2022-10-30
  Administered 2022-10-29: 90 mg via INTRAVENOUS

## 2022-10-29 NOTE — Consult Note (Signed)
Neurology Consultation Reason for Consult: Status epilepticus Referring Physician: Katrinka Blazing, D  CC: Seizures  History is obtained from: Chart review  HPI: Betty Archer is a 62 y.o. female with a history of aneurysm and previous stroke who presents with seizure.  She was seen in the emergency department on 8/2 and started on Keppra.   LKW: *** tnk given?: no, *** Premorbid modified rankin scale: *** ICH Score: ***    ROS: A 14 point ROS was performed and is negative except as noted in the HPI. *** Unable to obtain due to altered mental status.   Past Medical History:  Diagnosis Date   Brain aneurysm    Cerebral aneurysm    Cognitive communication deficit    Hemiparesis (HCC)    Hemiplegia (HCC)    Hyperlipidemia    Hyperplastic colon polyp    Internal hemorrhoids    Stroke (HCC)    TIA (transient ischemic attack)    ***  Family History  Problem Relation Age of Onset   Hypertension Mother    Lung cancer Father    Breast cancer Maternal Grandmother    Breast cancer Maternal Aunt    Colon cancer Neg Hx    Esophageal cancer Neg Hx    Stomach cancer Neg Hx    ***  Social History:  reports that she has been smoking cigarettes. She has a 13.5 pack-year smoking history. She has never used smokeless tobacco. She reports current alcohol use. She reports that she does not use drugs. ***  Exam: Current vital signs: BP (!) 141/85   Pulse (!) 155   Resp (!) 30   SpO2 100%  Vital signs in last 24 hours: Pulse Rate:  [155] 155 (09/08 2325) Resp:  [30] 30 (09/08 2325) BP: (141)/(85) 141/85 (09/08 2325) SpO2:  [100 %] 100 % (09/08 2325)   Physical Exam  Appears well-developed and well-nourished.   Neuro: Mental Status: Patient is awake, alert, oriented to person, place, month, year, and situation.*** Patient is able to give a clear and coherent history.*** No signs of aphasia or neglect*** Cranial Nerves: II: Visual Fields are full. Pupils are equal, round, and  reactive to light.  *** III,IV, VI: EOMI without ptosis or diploplia.  V: Facial sensation is symmetric to temperature VII: Facial movement is symmetric.  VIII: hearing is intact to voice X: Uvula elevates symmetrically XI: Shoulder shrug is symmetric. XII: tongue is midline without atrophy or fasciculations.  Motor: Tone is normal. Bulk is normal. 5/5 strength was present in all four extremities. *** Sensory: Sensation is symmetric to light touch and temperature in the arms and legs.*** Deep Tendon Reflexes: 2+ and symmetric in the biceps and patellae. *** Plantars: Toes are downgoing bilaterally. *** Cerebellar: FNF and HKS are intact bilaterally***      I have reviewed labs in epic and the results pertinent to this consultation are: ***  I have reviewed the images obtained:***  Impression: ***  Recommendations: 1) ***   Ritta Slot, MD [facility] [phone]  If 7pm- 7am, please page neurology on call as listed in [facility].
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
## 2022-10-29 NOTE — ED Provider Notes (Signed)
[facility] Provider Note  CSN: [id number] Arrival date & time: 10/29/22 2320  Chief Complaint(s) Seizures  HPI Betty Archer is a 62 y.o. female with past medical history as below, significant for HLD, TIA, CVA, ACA aneurysm who presents to the ED with complaint of seizure/ams  History per EMS, EMS called out for possible seizure, pt was awake on EMS arrival, she had a seizure and had prolonged post ictal period. She was given versed during seizure by EMS. EMS began bagging the pt en route as respiratory distress. No repeat seizure activity en route. On arrival pt unresponsive, pupils pinpoint. No purposeful movements.   Past Medical History Past Medical History:  Diagnosis Date   Brain aneurysm    Cerebral aneurysm    Cognitive communication deficit    Hemiparesis (HCC)    Hemiplegia (HCC)    Hyperlipidemia    Hyperplastic colon polyp    Internal hemorrhoids    Stroke [facility])    TIA (transient ischemic attack)    Patient Active Problem List   Diagnosis Date Noted   Status epilepticus (HCC) 10/29/2022   Cognitive deficit, post-stroke 11/09/2021   Reactive depression 03/23/2021   Aneurysm (HCC) 01/18/2021   History of right ACA stroke 09/03/2020   Acute CVA (cerebrovascular accident) (HCC) 09/01/2020   Acute ischemic stroke (HCC) 08/31/2020   Aneurysm of anterior communicating artery 08/25/2020   Aneurysm, cerebral, nonruptured 08/25/2020   Microcytic anemia 10/26/2014   Vitamin D deficiency 10/17/2014   Stenosing tenosynovitis of thumb 10/17/2014   Internal hemorrhoids 10/17/2014   History of colon polyps 10/17/2014   Osteoarthritis of carpometacarpal joints of both thumbs 04/04/2014   Pre-diabetes 08/18/2013   History of TIA (transient ischemic attack) 08/12/2013   Routine health maintenance 08/15/2010   Hyperlipidemia 08/05/2008   Obesity 07/30/2007   TOBACCO DEPENDENCE 04/19/2006   Home Medication(s) Prior to Admission medications    Medication Sig Start Date End Date Taking? Authorizing Provider  acetaminophen (TYLENOL) 325 MG tablet Take 2 tablets (650 mg total) by mouth every 6 (six) hours as needed for mild pain or headache. 09/23/20   Love, Evlyn Kanner, PA-C  aspirin 81 MG chewable tablet Chew 1 tablet (81 mg total) by mouth in the morning and at bedtime. 09/23/20   Love, Evlyn Kanner, PA-C  atorvastatin (LIPITOR) 80 MG tablet Take 1 tablet (80 mg total) by mouth at bedtime. 09/12/22   Ranelle Oyster, MD  buPROPion (WELLBUTRIN XL) 300 MG 24 hr tablet TAKE 1 TABLET(300 MG) BY MOUTH DAILY 09/26/22   Ranelle Oyster, MD  hydrocortisone 2.5 % cream Apply 1 application. topically 2 (two) times daily as needed (itching).    [provider]  levETIRAcetam (KEPPRA) 500 MG tablet Take 1 tablet (500 mg total) by mouth 2 (two) times daily. 09/22/22 10/22/22  Tegeler, Canary Brim, MD  lidocaine (LIDODERM) 5 % Place 1 patch onto the skin daily. Apply at 8 am and remove at  pm daily. Patient taking differently: Place 1 patch onto the skin daily as needed (pain.). Apply at 8 am and remove at  pm daily. 09/23/20   Love, Evlyn Kanner, PA-C  meloxicam (MOBIC) 7.5 MG tablet Take 1 tablet (7.5 mg total) by mouth daily. 09/06/22   Ranelle Oyster, MD  methylphenidate (RITALIN) 10 MG tablet Take 1 tablet (10 mg total) by mouth 2 (two) times daily with breakfast and lunch. At 0700 and 1200 daily Patient not taking: Reported on 09/06/2022 11/09/21  Ranelle Oyster, MD  methylphenidate (RITALIN) 10 MG tablet Take 1 tablet (10 mg total) by mouth 2 (two) times daily with breakfast and lunch. Patient not taking: Reported on 09/06/2022 11/09/21   Ranelle Oyster, MD  senna-docusate (SENOKOT-S) 8.6-50 MG tablet Take 2 tablets by mouth at bedtime. 09/23/20   Love, Evlyn Kanner, PA-C  ticagrelor (BRILINTA) 90 MG TABS tablet Take 1 tablet (90 mg total) by mouth 2 (two) times daily. 09/06/21   Ranelle Oyster, MD                                                                                                                                     Past Surgical History Past Surgical History:  Procedure Laterality Date   BREAST BIOPSY      Core biopsy done on April 2003   COLONOSCOPY     IR 3D INDEPENDENT [facility]  08/25/2020   IR 3D INDEPENDENT [facility]  08/25/2020   IR 3D INDEPENDENT [facility]  01/17/2021   IR ANGIO EXTRACRAN SEL COM CAROTID INNOMINATE UNI R MOD SED  01/17/2021   IR ANGIO INTRA EXTRACRAN SEL INTERNAL CAROTID BILAT MOD SED  08/25/2020   IR ANGIO INTRA EXTRACRAN SEL INTERNAL CAROTID UNI L MOD SED  01/17/2021   IR ANGIO VERTEBRAL SEL VERTEBRAL BILAT MOD SED  08/25/2020   IR CT HEAD LTD  08/25/2020   IR CT HEAD LTD  01/17/2021   IR INTRA CRAN STENT  08/25/2020   IR TRANSCATH/EMBOLIZ  08/25/2020   IR TRANSCATH/EMBOLIZ  01/17/2021   IR US GUIDE VASC ACCESS RIGHT  08/25/2020   IR US GUIDE VASC ACCESS RIGHT  01/17/2021   PARTIAL HYSTERECTOMY   10/22/1999    Still has cervix   RADIOLOGY WITH ANESTHESIA N/A 08/25/2020   Procedure: IR WITH ANESTHESIA EMBOLIZATION;  Surgeon: Baldemar Lenis, MD;  Location: [facility] OR;  Service: Radiology;  Laterality: N/A;   RADIOLOGY WITH ANESTHESIA N/A 01/17/2021   Procedure: RADIOLOGY WITH ANESTHESIA  EMBOLIZATION;  Surgeon: Baldemar Lenis, MD;  Location: [facility] OR;  Service: Radiology;  Laterality: N/A;   TRIGGER FINGER RELEASE Left 12/25/2013   Procedure: LEFT THUMB TRIGGER RELEASE ;  Surgeon: Betha Loa, MD;  Location: [facility];  Service: Orthopedics;  Laterality: Left;   Family History Family History  Problem Relation Age of Onset   Hypertension Mother    Lung cancer Father    Breast cancer Maternal Grandmother    Breast cancer Maternal Aunt    Colon cancer Neg Hx    Esophageal cancer Neg Hx    Stomach cancer Neg Hx     Social History Social History   Tobacco Use   Smoking status: Some Days    Current packs/day: 0.50    Average packs/day: 0.5 packs/day for 27.0 years (13.5 ttl pk-yrs)     Types: Cigarettes   Smokeless tobacco: Never   Tobacco comments:  11/02/20 one a day  Vaping Use   Vaping status: Never Used  Substance Use Topics   Alcohol use: Yes    Alcohol/week: 0.0 standard drinks of alcohol    Comment:  patient is a social drinker   Drug use: No   Allergies Patient has no known allergies.  Review of Systems Review of Systems  Unable to perform ROS: Patient unresponsive    Physical Exam Vital Signs  I have reviewed the triage vital signs BP (!) 158/93   Pulse (!) 145   Resp (!) 23   SpO2 100%  Physical Exam Vitals and nursing note reviewed. Exam conducted with a chaperone present.  Constitutional:      General: She is in acute distress.     Appearance: She is obese. She is ill-appearing.  HENT:     Head: Normocephalic and atraumatic.     Right Ear: External ear normal.     Left Ear: External ear normal.     Nose: Nose normal.     Mouth/Throat:     Mouth: Mucous membranes are dry.     Comments: Dried blood around mouth Eyes:     General: No scleral icterus.       Right eye: No discharge.        Left eye: No discharge.     Conjunctiva/sclera: Conjunctivae normal.     Comments: Pupils constricted   Cardiovascular:     Rate and Rhythm: Regular rhythm. Tachycardia present.     Pulses: Normal pulses.     Heart sounds: Normal heart sounds. No murmur heard. Pulmonary:     Effort: Respiratory distress present.     Comments: Agonal respirations  Abdominal:     General: Abdomen is flat. There is no distension.     Palpations: Abdomen is soft.  Musculoskeletal:     Cervical back: No rigidity.     Right lower leg: No edema.     Left lower leg: No edema.  Skin:    General: Skin is warm.     Capillary Refill: Capillary refill takes 2 to 3 seconds.     Coloration: Skin is not jaundiced.  Neurological:     Mental Status: She is unresponsive.     GCS: GCS eye subscore is 1. GCS verbal subscore is 1. GCS motor subscore is 4.     Cranial Nerves:  No facial asymmetry.     Motor: No seizure activity.     Comments: No clonus No babinski Withdraws noxious stimulus all 4  Pupils constricted 1-93mm /reactive      ED Results and Treatments Labs (all labs ordered are listed, but only abnormal results are displayed) Labs Reviewed  CBG MONITORING, ED - Abnormal; Notable for the following components:      Result Value   Glucose-Capillary 107 (*)    All other components within normal limits  I-STAT CG4 LACTIC ACID, ED - Abnormal; Notable for the following components:   Lactic Acid, Venous 14.0 (*)    All other components within normal limits  SARS CORONAVIRUS 2 BY RT PCR  MRSA NEXT GEN BY PCR, NASAL  TRIGLYCERIDES  HIV ANTIBODY (ROUTINE TESTING W REFLEX)  HEMOGLOBIN A1C  CBC WITH DIFFERENTIAL/PLATELET  COMPREHENSIVE METABOLIC PANEL  LIPASE, BLOOD  CK  ETHANOL  RAPID URINE DRUG SCREEN, HOSP PERFORMED  ACETAMINOPHEN LEVEL  SALICYLATE LEVEL  CBC  BASIC METABOLIC PANEL  MAGNESIUM  PHOSPHORUS  TSH  T4, FREE  URINE DRUGS OF ABUSE SCREEN W ALC, ROUTINE (  REF LAB)  I-STAT ARTERIAL BLOOD GAS, ED  I-STAT CG4 LACTIC ACID, ED  TROPONIN I (HIGH SENSITIVITY)                                                                                                                          Radiology DG Chest Portable 1 View  Result Date: 10/29/2022 CLINICAL DATA:  Verified to placement EXAM: PORTABLE CHEST 1 VIEW COMPARISON:  Chest x-ray 02/27/2018 FINDINGS: Endotracheal tube tip is 3.5 cm above the carina. Enteric tube extends below the diaphragm. The heart size and mediastinal contours are within normal limits. Both lungs are clear. The visualized skeletal structures are unremarkable. IMPRESSION: Endotracheal tube tip is 3.5 cm above the carina. Electronically Signed   By: Darliss Cheney M.D.   On: 10/29/2022 23:56    Pertinent labs & imaging results that were available during my care of the patient were reviewed by me and considered in my medical  decision making (see MDM for details).  Medications Ordered in ED Medications  rocuronium bromide 100 MG/10ML SOSY (0 mg  Hold 10/30/22 0001)  etomidate (AMIDATE) 2 MG/ML injection (0 mg  Hold 10/30/22 0001)  docusate (COLACE) 50 MG/5ML liquid 100 mg (has no administration in time range)  polyethylene glycol (MIRALAX / GLYCOLAX) packet 17 g (has no administration in time range)  propofol (DIPRIVAN) 1000 MG/100ML infusion (10 mcg/kg/min Intravenous New Bag/Given 10/29/22 2358)  fentaNYL (SUBLIMAZE) injection 50 mcg (0 mcg Intravenous Hold 10/30/22 0002)  fentaNYL in NS (67mcg/ml) infusion-PREMIX (50 mcg/hr Intravenous New Bag/Given 10/29/22 2346)  fentaNYL (SUBLIMAZE) bolus via infusion 50-100 mcg (has no administration in time range)  docusate (COLACE) 50 MG/5ML liquid 100 mg (has no administration in time range)  polyethylene glycol (MIRALAX / GLYCOLAX) packet 17 g (has no administration in time range)  insulin aspart (novoLOG) injection 0-9 Units (has no administration in time range)  aspirin chewable tablet 81 mg (has no administration in time range)  atorvastatin (LIPITOR) tablet 40 mg (has no administration in time range)  enoxaparin (LOVENOX) injection 40 mg (has no administration in time range)  lactated ringers infusion (has no administration in time range)  pantoprazole (PROTONIX) injection 40 mg (has no administration in time range)  Chlorhexidine Gluconate Cloth 2 % PADS 6 each (has no administration in time range)  Oral care mouth rinse (has no administration in time range)  Oral care mouth rinse (has no administration in time range)  levETIRAcetam (KEPPRA) IVPB 1000 mg/100 mL premix (1,000 mg Intravenous New Bag/Given 10/30/22 0000)  Procedures .Critical Care  Performed by: Sloan Leiter, DO Authorized by: Sloan Leiter, DO   Critical care  provider statement:    Critical care time (minutes):  34   Critical care time was exclusive of:  Separately billable procedures and treating other patients   Critical care was necessary to treat or prevent imminent or life-threatening deterioration of the following conditions:  CNS failure or compromise and respiratory failure   Critical care was time spent personally by me on the following activities:  Development of treatment plan with patient or surrogate, discussions with consultants, evaluation of patient's response to treatment, examination of patient, ordering and review of laboratory studies, ordering and review of radiographic studies, ordering and performing treatments and interventions, pulse oximetry, re-evaluation of patient's condition, review of old charts and obtaining history from patient or surrogate   Care discussed with: admitting provider   Procedure Name: Intubation Date/Time: 10/30/2022 12:59 AM  Performed by: Sloan Leiter, DOPre-anesthesia Checklist: Patient identified, Patient being monitored, Emergency Drugs available, Timeout performed and Suction available Oxygen Delivery Method: Non-rebreather mask Preoxygenation: Pre-oxygenation with 100% oxygen Induction Type: Rapid sequence Ventilation: Mask ventilation without difficulty Laryngoscope Size: Glidescope and 3 Grade View: Grade I Tube size: 7.5 mm Number of attempts: 1 Placement Confirmation: ETT inserted through vocal cords under direct vision, CO2 detector and Breath sounds checked- equal and bilateral Secured at: 23 cm Tube secured with: ETT holder Dental Injury: Teeth and Oropharynx as per pre-operative assessment  Difficulty Due To: Difficulty was anticipated Comments: Tongue laceration noted Blood in posterior oropharynx       (including critical care time)  Medical Decision Making / ED Course    Medical Decision Making:    Betty Archer is a 62 y.o. female with history as above here with  seizure/ams. The complaint involves an extensive differential diagnosis and also carries with it a high risk of complications and morbidity.  Serious etiology was considered. Ddx includes but is not limited to: Differential diagnoses for altered mental status includes but is not exclusive to alcohol, illicit or prescription medications, intracranial pathology such as stroke, intracerebral hemorrhage, fever or infectious causes including sepsis, hypoxemia, uremia, trauma, endocrine related disorders such as diabetes, hypoglycemia, thyroid-related diseases, etc.   Complete initial physical exam performed, notably the patient  was unresponsive.    Reviewed and confirmed nursing documentation for past medical history, family history, social history.  Vital signs reviewed.        On arrival patient unresponsive, she does withdraw from pain in all 4 extremities to noxious stimulus.  Does not open her eyes or speak.  No seizure activity on arrival.  Pupils constricted, given Narcan without much improvement to mental status.  Patient requiring bagging, respiratory distress.  Will be intubated for airway protection.  Code medical paged.  Discussed with PCCM Dr. Katrinka Blazing and neurology Dr. Domenic Polite for now, load w/ keppra, seizure prec, IVF, bp stable, admit to ICU               Additional history obtained: -Additional history obtained from family -External records from outside source obtained and reviewed including: Chart review including previous notes, labs, imaging, consultation notes including  Prior ed visits, prior labs/imaging/home meds   Lab Tests: -I ordered, reviewed, and interpreted labs.   The pertinent results include:   Labs Reviewed  CBG MONITORING, ED - Abnormal; Notable for the following components:      Result Value   Glucose-Capillary 107 (*)  All other components within normal limits  I-STAT CG4 LACTIC ACID, ED - Abnormal; Notable for the following  components:   Lactic Acid, Venous 14.0 (*)    All other components within normal limits  SARS CORONAVIRUS 2 BY RT PCR  MRSA NEXT GEN BY PCR, NASAL  TRIGLYCERIDES  HIV ANTIBODY (ROUTINE TESTING W REFLEX)  HEMOGLOBIN A1C  CBC WITH DIFFERENTIAL/PLATELET  COMPREHENSIVE METABOLIC PANEL  LIPASE, BLOOD  CK  ETHANOL  RAPID URINE DRUG SCREEN, HOSP PERFORMED  ACETAMINOPHEN LEVEL  SALICYLATE LEVEL  CBC  BASIC METABOLIC PANEL  MAGNESIUM  PHOSPHORUS  TSH  T4, FREE  URINE DRUGS OF ABUSE SCREEN W ALC, ROUTINE (REF LAB)  I-STAT ARTERIAL BLOOD GAS, ED  I-STAT CG4 LACTIC ACID, ED  TROPONIN I (HIGH SENSITIVITY)    Notable for LA ++  EKG   EKG Interpretation Date/Time:  Sunday October 29 2022 23:26:20 EDT Ventricular Rate:  150 PR Interval:  144 QRS Duration:  85 QT Interval:  360 QTC Calculation: 569 R Axis:   86  Text Interpretation: Sinus tachycardia Consider right atrial enlargement Borderline right axis deviation Abnrm T, consider ischemia, anterolateral lds Prolonged QT interval Confirmed by Tanda Rockers ([id number]) on 10/30/2022 12:55:57 AM         Imaging Studies ordered: I ordered imaging studies including CXR CTH I independently visualized the following imaging with scope of interpretation limited to determining acute life threatening conditions related to emergency care; findings noted above, significant for CXR with ETT in position, CTH pending  I independently visualized and interpreted imaging. I agree with the radiologist interpretation   Medicines ordered and prescription drug management: Meds ordered this encounter  Medications   rocuronium bromide 100 MG/10ML SOSY    [place], [place] D: cabinet override   etomidate (AMIDATE) 2 MG/ML injection    [place], [place] D: cabinet override   fentaNYL (SUBLIMAZE) 50 MCG/ML injection    [place], [place] D: cabinet override   DISCONTD: fentaNYL (SUBLIMAZE) injection   DISCONTD: etomidate (AMIDATE) injection   DISCONTD: rocuronium  (ZEMURON) injection   docusate (COLACE) 50 MG/5ML liquid 100 mg   polyethylene glycol (MIRALAX / GLYCOLAX) packet 17 g   propofol (DIPRIVAN) 1000 MG/100ML infusion   fentaNYL (SUBLIMAZE) injection 50 mcg   fentaNYL in NS (54mcg/ml) infusion-PREMIX   fentaNYL (SUBLIMAZE) bolus via infusion 50-100 mcg   docusate (COLACE) 50 MG/5ML liquid 100 mg   polyethylene glycol (MIRALAX / GLYCOLAX) packet 17 g   insulin aspart (novoLOG) injection 0-9 Units    Order Specific Question:   Correction coverage:    Answer:   Sensitive (thin, NPO, renal)    Order Specific Question:   CBG < 70:    Answer:   Implement Hypoglycemia Standing Orders and refer to Hypoglycemia Standing Orders sidebar report    Order Specific Question:   CBG 70 - 120:    Answer:   0 units    Order Specific Question:   CBG 121 - 150:    Answer:   1 unit    Order Specific Question:   CBG 151 - 200:    Answer:   2 units    Order Specific Question:   CBG 201 - 250:    Answer:   3 units    Order Specific Question:   CBG 251 - 300:    Answer:   5 units    Order Specific Question:   CBG 301 - 350:    Answer:   7 units  Order Specific Question:   CBG 351 - 400    Answer:   9 units    Order Specific Question:   CBG > 400    Answer:   call MD and obtain STAT lab verification   DISCONTD: levETIRAcetam (KEPPRA) 2,000 mg in sodium chloride 0.9 % 250 mL IVPB   levETIRAcetam (KEPPRA) IVPB 1000 mg/100 mL premix   propofol (DIPRIVAN) 1000 MG/100ML infusion    [place], [place] : cabinet override   aspirin chewable tablet 81 mg   atorvastatin (LIPITOR) tablet 40 mg   enoxaparin (LOVENOX) injection 40 mg   lactated ringers infusion   pantoprazole (PROTONIX) injection 40 mg   Chlorhexidine Gluconate Cloth 2 % PADS 6 each   Oral care mouth rinse   Oral care mouth rinse    -I have reviewed the patients home medicines and have made adjustments as needed   Consultations Obtained: I requested consultation with the PCCM/  neuro,  and discussed lab and imaging findings as well as pertinent plan - they recommend: admit   Cardiac Monitoring: The patient was maintained on a cardiac monitor.  I personally viewed and interpreted the cardiac monitored which showed an underlying rhythm of: sinus tachy  Social Determinants of Health:  Diagnosis or treatment significantly limited by social determinants of health: current smoker and obesity   Reevaluation: After the interventions noted above, I reevaluated the patient and found that they have stayed the same  Co morbidities that complicate the patient evaluation  Past Medical History:  Diagnosis Date   Brain aneurysm    Cerebral aneurysm    Cognitive communication deficit    Hemiparesis (HCC)    Hemiplegia (HCC)    Hyperlipidemia    Hyperplastic colon polyp    Internal hemorrhoids    Stroke (HCC)    TIA (transient ischemic attack)       Dispostion: Disposition decision including need for hospitalization was considered, and patient admitted to the hospital.    Final Clinical Impression(s) / ED Diagnoses Final diagnoses:  Altered mental status, unspecified altered mental status type  Seizure [facility])  Respiratory distress        Sloan Leiter, DO 10/30/22 0100

## 2022-10-29 NOTE — Code Documentation (Addendum)
23 at the tooth 7.5 ett

## 2022-10-29 NOTE — H&P (Signed)
NAME:  Betty Archer, MRN:  [id number], DOB:  1960-12-18, LOS: 0 ADMISSION DATE:  10/29/2022, CONSULTATION DATE:  10/30/22 REFERRING MD:  EDP, CHIEF COMPLAINT:  seizures   History of Present Illness:  This is a 62 year old woman with a history of seizures and known left MCA aneurysm status postembolization in 2022 who is presenting after being found down at home.  She had multiple witnessed seizures by EMS.  Received 7.5 mg of Ativan.  Not protecting her airway on arrival to ER so intubated.  Head CT is pending.  Pulmonary critical care medicine is consulted to admit patient.  Family and route, history otherwise per EMS and chart review.  Pertinent  Medical History  Prior stroke Seizures Bilateral MCA aneurysms s/p bilateral repair Hypertension hyperlipidemia Significant Hospital Events: Including procedures, antibiotic start and stop dates in addition to other pertinent events   Consult 10/29/2022  Interim History / Subjective:  Intubated and sedated  Objective   Blood pressure (!) 141/85, pulse (!) 155, resp. rate (!) 30, SpO2 100%.       No intake or output data in the 24 hours ending 10/29/22 2345 There were no vitals filed for this visit.  Examination: General: Intubated, sedated, paralyzed HENT: Pupils are 4 mm and equal, reactive to light; dried blood around mouth from bit tongue Lungs: Lungs have minimal rhonchi bilaterally, she is passive on vent Cardiovascular: Heart sounds are tacky, regular, sinus tach on monitor Abdomen: Abdomen is soft with hypoactive bowel sounds Extremities: There is no edema Neuro: Per ER, was not following commands prior to admission but was moving extremities GU: Foley to be placed  Labs, head CT pending  Resolved Hospital Problem list   Not applicable  Assessment & Plan:  Presumed status epilepticus resulting in airway compromise status post intubation History of prior seizures History of bilateral MCA aneurysms both  coiled ADHD Hypertension, hyperlipidemia Metabolic encephalopathy likely postictal  - Head CT: looks neg, stable coiling - Ceribell to be hooked up, given keppra, Dr. Amada Jupiter to review - Vent bundle - Aspirin for now, brillinta looks like stopped per Riley Kill note 09/06/22 - F/u UDS - Heavily sedate for now - F/u admit labs including CK - Gentle LR  Best Practice (right click and "Reselect all SmartList Selections" daily)   Diet/type: NPO DVT prophylaxis: SCD lovenox GI prophylaxis: PPI Lines: N/A Foley:  Yes, and it is still needed Code Status:  full code Last date of multidisciplinary goals of care discussion [pending family arrival]  Labs   CBC: No results for input(s): "WBC", "NEUTROABS", "HGB", "HCT", "MCV", "PLT" in the last 168 hours.  Basic Metabolic Panel: No results for input(s): "NA", "K", "CL", "CO2", "GLUCOSE", "BUN", "CREATININE", "CALCIUM", "MG", "PHOS" in the last 168 hours. GFR: CrCl cannot be calculated (Patient's most recent lab result is older than the maximum 21 days allowed.). No results for input(s): "PROCALCITON", "WBC", "LATICACIDVEN" in the last 168 hours.  Liver Function Tests: No results for input(s): "AST", "ALT", "ALKPHOS", "BILITOT", "PROT", "ALBUMIN" in the last 168 hours. No results for input(s): "LIPASE", "AMYLASE" in the last 168 hours. No results for input(s): "AMMONIA" in the last 168 hours.  ABG    Component Value Date/Time   PHART 7.306 (L) 08/25/2020 1323   PCO2ART 44.3 08/25/2020 1323   PO2ART 189 (H) 08/25/2020 1323   HCO3 22.1 08/25/2020 1323   TCO2 25 08/31/2020 2148   ACIDBASEDEF 4.0 (H) 08/25/2020 1323   O2SAT 100.0 08/25/2020 1323     Coagulation  Profile: No results for input(s): "INR", "PROTIME" in the last 168 hours.  Cardiac Enzymes: No results for input(s): "CKTOTAL", "CKMB", "CKMBINDEX", "TROPONINI" in the last 168 hours.  HbA1C: Hemoglobin A1C  Date/Time Value Ref Range Status  10/16/2014 04:18 PM 5.3   Final   Hgb A1c MFr Bld  Date/Time Value Ref Range Status  09/01/2020 05:00 AM 5.3 4.8 - 5.6 % Final    Comment:    (NOTE) Pre diabetes:          5.7%-6.4%  Diabetes:              >6.4%  Glycemic control for   <7.0% adults with diabetes   08/12/2013 04:59 PM 5.7 (H) <5.7 % Final    Comment:    (NOTE)                                                                       According to the ADA Clinical Practice Recommendations for 2011, when HbA1c is used as a screening test:  >=6.5%   Diagnostic of Diabetes Mellitus           (if abnormal result is confirmed) 5.7-6.4%   Increased risk of developing Diabetes Mellitus References:Diagnosis and Classification of Diabetes Mellitus,Diabetes Care,2011,34(Suppl 1):S62-S69 and Standards of Medical Care in         Diabetes - 2011,Diabetes Care,2011,34 (Suppl 1):S11-S61.    CBG: Recent Labs  Lab 10/29/22 2332  GLUCAP 107*    Review of Systems:   Intubated/sedated  Past Medical History:  She,  has a past medical history of Brain aneurysm, Cerebral aneurysm, Cognitive communication deficit, Hemiparesis (HCC), Hemiplegia (HCC), Hyperlipidemia, Hyperplastic colon polyp, Internal hemorrhoids, Stroke (HCC), and TIA (transient ischemic attack).   Surgical History:   Past Surgical History:  Procedure Laterality Date   BREAST BIOPSY      Core biopsy done on April 2003   COLONOSCOPY     IR 3D INDEPENDENT WKST  08/25/2020   IR 3D INDEPENDENT WKST  08/25/2020   IR 3D INDEPENDENT WKST  01/17/2021   IR ANGIO EXTRACRAN SEL COM CAROTID INNOMINATE UNI R MOD SED  01/17/2021   IR ANGIO INTRA EXTRACRAN SEL INTERNAL CAROTID BILAT MOD SED  08/25/2020   IR ANGIO INTRA EXTRACRAN SEL INTERNAL CAROTID UNI L MOD SED  01/17/2021   IR ANGIO VERTEBRAL SEL VERTEBRAL BILAT MOD SED  08/25/2020   IR CT HEAD LTD  08/25/2020   IR CT HEAD LTD  01/17/2021   IR INTRA CRAN STENT  08/25/2020   IR TRANSCATH/EMBOLIZ  08/25/2020   IR TRANSCATH/EMBOLIZ  01/17/2021   IR US GUIDE  VASC ACCESS RIGHT  08/25/2020   IR US GUIDE VASC ACCESS RIGHT  01/17/2021   PARTIAL HYSTERECTOMY   10/22/1999    Still has cervix   RADIOLOGY WITH ANESTHESIA N/A 08/25/2020   Procedure: IR WITH ANESTHESIA EMBOLIZATION;  Surgeon: Baldemar Lenis, MD;  Location: [facility] OR;  Service: Radiology;  Laterality: N/A;   RADIOLOGY WITH ANESTHESIA N/A 01/17/2021   Procedure: RADIOLOGY WITH ANESTHESIA  EMBOLIZATION;  Surgeon: Baldemar Lenis, MD;  Location: [facility] OR;  Service: Radiology;  Laterality: N/A;   TRIGGER FINGER RELEASE Left 12/25/2013   Procedure: LEFT THUMB TRIGGER RELEASE ;  Surgeon: Betha Loa, MD;  Location: [facility];  Service: Orthopedics;  Laterality: Left;     Social History:   reports that she has been smoking cigarettes. She has a 13.5 pack-year smoking history. She has never used smokeless tobacco. She reports current alcohol use. She reports that she does not use drugs.   Family History:  Her family history includes Breast cancer in her maternal aunt and maternal grandmother; Hypertension in her mother; Lung cancer in her father. There is no history of Colon cancer, Esophageal cancer, or Stomach cancer.   Allergies No Known Allergies   Home Medications  Prior to Admission medications   Medication Sig Start Date End Date Taking? Authorizing Provider  acetaminophen (TYLENOL) 325 MG tablet Take 2 tablets (650 mg total) by mouth every 6 (six) hours as needed for mild pain or headache. 09/23/20   Love, Evlyn Kanner, PA-C  aspirin 81 MG chewable tablet Chew 1 tablet (81 mg total) by mouth in the morning and at bedtime. 09/23/20   Love, Evlyn Kanner, PA-C  atorvastatin (LIPITOR) 80 MG tablet Take 1 tablet (80 mg total) by mouth at bedtime. 09/12/22   Ranelle Oyster, MD  buPROPion (WELLBUTRIN XL) 300 MG 24 hr tablet TAKE 1 TABLET(300 MG) BY MOUTH DAILY 09/26/22   Ranelle Oyster, MD  hydrocortisone 2.5 % cream Apply 1 application. topically 2 (two) times daily as  needed (itching).    [provider]  levETIRAcetam (KEPPRA) 500 MG tablet Take 1 tablet (500 mg total) by mouth 2 (two) times daily. 09/22/22 10/22/22  Tegeler, Canary Brim, MD  lidocaine (LIDODERM) 5 % Place 1 patch onto the skin daily. Apply at 8 am and remove at  pm daily. Patient taking differently: Place 1 patch onto the skin daily as needed (pain.). Apply at 8 am and remove at  pm daily. 09/23/20   Love, Evlyn Kanner, PA-C  meloxicam (MOBIC) 7.5 MG tablet Take 1 tablet (7.5 mg total) by mouth daily. 09/06/22   Ranelle Oyster, MD  methylphenidate (RITALIN) 10 MG tablet Take 1 tablet (10 mg total) by mouth 2 (two) times daily with breakfast and lunch. At 0700 and 1200 daily Patient not taking: Reported on 09/06/2022 11/09/21   Ranelle Oyster, MD  methylphenidate (RITALIN) 10 MG tablet Take 1 tablet (10 mg total) by mouth 2 (two) times daily with breakfast and lunch. Patient not taking: Reported on 09/06/2022 11/09/21   Ranelle Oyster, MD  senna-docusate (SENOKOT-S) 8.6-50 MG tablet Take 2 tablets by mouth at bedtime. 09/23/20   Love, Evlyn Kanner, PA-C  ticagrelor (BRILINTA) 90 MG TABS tablet Take 1 tablet (90 mg total) by mouth 2 (two) times daily. 09/06/21   Ranelle Oyster, MD     Critical care time: 33 mins

## 2022-10-29 NOTE — ED Notes (Signed)
2 mg Narcan IV given

## 2022-10-30 ENCOUNTER — Telehealth: Payer: Self-pay

## 2022-10-30 ENCOUNTER — Encounter (HOSPITAL_COMMUNITY): Payer: Self-pay | Admitting: Internal Medicine

## 2022-10-30 ENCOUNTER — Inpatient Hospital Stay (HOSPITAL_COMMUNITY): Payer: Medicaid Other

## 2022-10-30 DIAGNOSIS — G40901 Epilepsy, unspecified, not intractable, with status epilepticus: Secondary | ICD-10-CM | POA: Diagnosis not present

## 2022-10-30 DIAGNOSIS — J9601 Acute respiratory failure with hypoxia: Secondary | ICD-10-CM

## 2022-10-30 LAB — COMPREHENSIVE METABOLIC PANEL
ALT: 24 U/L (ref 0–44)
AST: 25 U/L (ref 15–41)
Albumin: 3.1 g/dL — ABNORMAL LOW (ref 3.5–5.0)
Alkaline Phosphatase: 81 U/L (ref 38–126)
Anion gap: 10 (ref 5–15)
BUN: 10 mg/dL (ref 8–23)
CO2: 21 mmol/L — ABNORMAL LOW (ref 22–32)
Calcium: 8.6 mg/dL — ABNORMAL LOW (ref 8.9–10.3)
Chloride: 109 mmol/L (ref 98–111)
Creatinine, Ser: 1.07 mg/dL — ABNORMAL HIGH (ref 0.44–1.00)
GFR, Estimated: 59 mL/min — ABNORMAL LOW (ref >60)
Glucose, Bld: 103 mg/dL — ABNORMAL HIGH (ref 70–99)
Potassium: 3.9 mmol/L (ref 3.5–5.1)
Sodium: 140 mmol/L (ref 135–145)
Total Bilirubin: <0.1 mg/dL — ABNORMAL LOW (ref 0.3–1.2)
Total Protein: 6.2 g/dL — ABNORMAL LOW (ref 6.5–8.1)

## 2022-10-30 LAB — GLUCOSE, CAPILLARY
Glucose-Capillary: 72 mg/dL (ref 70–99)
Glucose-Capillary: 107 mg/dL — ABNORMAL HIGH (ref 70–99)
Glucose-Capillary: 86 mg/dL (ref 70–99)
Glucose-Capillary: 98 mg/dL (ref 70–99)
Glucose-Capillary: 83 mg/dL (ref 70–99)
Glucose-Capillary: 78 mg/dL (ref 70–99)
Glucose-Capillary: 78 mg/dL (ref 70–99)
Glucose-Capillary: 90 mg/dL (ref 70–99)

## 2022-10-30 LAB — CBC WITH DIFFERENTIAL/PLATELET
Abs Immature Granulocytes: 0.04 10*3/uL (ref 0.00–0.07)
Basophils Absolute: 0 10*3/uL (ref 0.0–0.1)
Basophils Relative: 0 %
Eosinophils Absolute: 0 10*3/uL (ref 0.0–0.5)
Eosinophils Relative: 0 %
HCT: 36 % (ref 36.0–46.0)
Hemoglobin: 11 g/dL — ABNORMAL LOW (ref 12.0–15.0)
Immature Granulocytes: 0 %
Lymphocytes Relative: 23 %
Lymphs Abs: 2.7 10*3/uL (ref 0.7–4.0)
MCH: 23.3 pg — ABNORMAL LOW (ref 26.0–34.0)
MCHC: 30.6 g/dL (ref 30.0–36.0)
MCV: 76.3 fL — ABNORMAL LOW (ref 80.0–100.0)
Monocytes Absolute: 0.8 10*3/uL (ref 0.1–1.0)
Monocytes Relative: 7 %
Neutro Abs: 8.2 10*3/uL — ABNORMAL HIGH (ref 1.7–7.7)
Neutrophils Relative %: 70 %
Platelets: 189 10*3/uL (ref 150–400)
RBC: 4.72 MIL/uL (ref 3.87–5.11)
RDW: 15.6 % — ABNORMAL HIGH (ref 11.5–15.5)
WBC: 11.8 10*3/uL — ABNORMAL HIGH (ref 4.0–10.5)
nRBC: 0 % (ref 0.0–0.2)

## 2022-10-30 LAB — LACTIC ACID, PLASMA: Lactic Acid, Venous: 2.5 mmol/L (ref 0.5–1.9)

## 2022-10-30 LAB — T4, FREE: Free T4: 0.97 ng/dL (ref 0.61–1.12)

## 2022-10-30 LAB — POCT I-STAT 7, (LYTES, BLD GAS, ICA,H+H)
Acid-base deficit: 5 mmol/L — ABNORMAL HIGH (ref 0.0–2.0)
Bicarbonate: 20.8 mmol/L (ref 20.0–28.0)
Calcium, Ion: 1.19 mmol/L (ref 1.15–1.40)
HCT: 36 % (ref 36.0–46.0)
Hemoglobin: 12.2 g/dL (ref 12.0–15.0)
O2 Saturation: 100 %
Patient temperature: 98.2
Potassium: 4 mmol/L (ref 3.5–5.1)
Sodium: 143 mmol/L (ref 135–145)
TCO2: 22 mmol/L (ref 22–32)
pCO2 arterial: 39.6 mmHg (ref 32–48)
pH, Arterial: 7.326 — ABNORMAL LOW (ref 7.35–7.45)
pO2, Arterial: 407 mmHg — ABNORMAL HIGH (ref 83–108)

## 2022-10-30 LAB — TSH: TSH: 0.544 u[IU]/mL (ref 0.350–4.500)

## 2022-10-30 LAB — I-STAT CG4 LACTIC ACID, ED: Lactic Acid, Venous: 14 mmol/L (ref 0.5–1.9)

## 2022-10-30 LAB — RAPID URINE DRUG SCREEN, HOSP PERFORMED
Amphetamines: NOT DETECTED
Barbiturates: NOT DETECTED
Benzodiazepines: POSITIVE — AB
Cocaine: NOT DETECTED
Opiates: NOT DETECTED
Tetrahydrocannabinol: NOT DETECTED

## 2022-10-30 LAB — SALICYLATE LEVEL: Salicylate Lvl: <7 mg/dL — ABNORMAL LOW (ref 7.0–30.0)

## 2022-10-30 LAB — HEMOGLOBIN A1C
Hgb A1c MFr Bld: 5.8 % — ABNORMAL HIGH (ref 4.8–5.6)
Mean Plasma Glucose: 119.76 mg/dL

## 2022-10-30 LAB — SARS CORONAVIRUS 2 BY RT PCR: SARS Coronavirus 2 by RT PCR: NEGATIVE

## 2022-10-30 LAB — TROPONIN I (HIGH SENSITIVITY)
Troponin I (High Sensitivity): 21 ng/L — ABNORMAL HIGH (ref <18)
Troponin I (High Sensitivity): 11 ng/L (ref <18)

## 2022-10-30 LAB — CK: Total CK: 361 U/L — ABNORMAL HIGH (ref 38–234)

## 2022-10-30 LAB — LIPASE, BLOOD: Lipase: 22 U/L (ref 11–51)

## 2022-10-30 LAB — MAGNESIUM: Magnesium: 2.1 mg/dL (ref 1.7–2.4)

## 2022-10-30 LAB — MRSA NEXT GEN BY PCR, NASAL: MRSA by PCR Next Gen: NOT DETECTED

## 2022-10-30 LAB — ETHANOL: Alcohol, Ethyl (B): <10 mg/dL (ref <10)

## 2022-10-30 LAB — PHOSPHORUS: Phosphorus: 2.4 mg/dL — ABNORMAL LOW (ref 2.5–4.6)

## 2022-10-30 LAB — TRIGLYCERIDES: Triglycerides: 83 mg/dL (ref <150)

## 2022-10-30 LAB — ACETAMINOPHEN LEVEL: Acetaminophen (Tylenol), Serum: <10 ug/mL — ABNORMAL LOW (ref 10–30)

## 2022-10-30 MED ORDER — POTASSIUM PHOSPHATES 15 MMOLE/5ML IV SOLN
15.0000 mmol | Freq: Once | INTRAVENOUS | Status: AC
  Administered 2022-10-30: 15 mmol via INTRAVENOUS
  Filled 2022-10-30: qty 5

## 2022-10-30 MED ORDER — PROSOURCE TF20 ENFIT COMPATIBL EN LIQD
60.0000 mL | Freq: Every day | ENTERAL | Status: DC
  Administered 2022-10-30: 60 mL
  Filled 2022-10-30: qty 60

## 2022-10-30 MED ORDER — PROPOFOL 1000 MG/100ML IV EMUL
0.0000 ug/kg/min | INTRAVENOUS | Status: DC
  Administered 2022-10-30: 25 ug/kg/min via INTRAVENOUS
  Administered 2022-10-30: 50 ug/kg/min via INTRAVENOUS
  Filled 2022-10-30 (×2): qty 100

## 2022-10-30 MED ORDER — VITAL 1.5 CAL PO LIQD
1000.0000 mL | ORAL | Status: DC
  Administered 2022-10-30: 1000 mL

## 2022-10-30 MED ORDER — LACTATED RINGERS IV SOLN: INTRAVENOUS | Status: DC

## 2022-10-30 MED ORDER — ORAL CARE MOUTH RINSE
15.0000 mL | OROMUCOSAL | Status: DC
  Administered 2022-10-30 (×10): 15 mL via OROMUCOSAL

## 2022-10-30 MED ORDER — ATORVASTATIN CALCIUM 40 MG PO TABS
40.0000 mg | ORAL_TABLET | Freq: Every day | ORAL | Status: DC
  Administered 2022-10-30: 40 mg
  Filled 2022-10-30: qty 1

## 2022-10-30 MED ORDER — ORAL CARE MOUTH RINSE: 15.0000 mL | OROMUCOSAL | Status: DC | PRN

## 2022-10-30 MED ORDER — VITAL HIGH PROTEIN PO LIQD: 1000.0000 mL | ORAL | Status: DC

## 2022-10-30 MED ORDER — CHLORHEXIDINE GLUCONATE CLOTH 2 % EX PADS
6.0000 | MEDICATED_PAD | Freq: Every day | CUTANEOUS | Status: DC
  Administered 2022-10-30 – 2022-11-02 (×4): 6 via TOPICAL

## 2022-10-30 MED ORDER — LEVETIRACETAM IN NACL 1000 MG/100ML IV SOLN
1000.0000 mg | Freq: Two times a day (BID) | INTRAVENOUS | Status: DC
  Administered 2022-10-30 – 2022-11-01 (×5): 1000 mg via INTRAVENOUS
  Filled 2022-10-30 (×5): qty 100

## 2022-10-30 MED ORDER — DEXTROSE 10 % IV SOLN: INTRAVENOUS | Status: DC

## 2022-10-30 MED ORDER — ASPIRIN 81 MG PO CHEW
81.0000 mg | CHEWABLE_TABLET | Freq: Every day | ORAL | Status: DC
  Administered 2022-10-30: 81 mg
  Filled 2022-10-30: qty 1

## 2022-10-30 MED ORDER — PANTOPRAZOLE SODIUM 40 MG IV SOLR
40.0000 mg | Freq: Every day | INTRAVENOUS | Status: DC
  Administered 2022-10-30: 40 mg via INTRAVENOUS
  Filled 2022-10-30: qty 10

## 2022-10-30 MED ORDER — ENOXAPARIN SODIUM 40 MG/0.4ML IJ SOSY
40.0000 mg | PREFILLED_SYRINGE | Freq: Every day | INTRAMUSCULAR | Status: DC
  Administered 2022-10-30 – 2022-11-01 (×3): 40 mg via SUBCUTANEOUS
  Filled 2022-10-30 (×4): qty 0.4

## 2022-10-30 MED ORDER — DEXTROSE 50 % IV SOLN
INTRAVENOUS | Status: AC
  Administered 2022-10-30: 25 mL
  Filled 2022-10-30: qty 50

## 2022-10-30 NOTE — Progress Notes (Signed)
Initial Nutrition Assessment  DOCUMENTATION CODES:   Obesity unspecified  INTERVENTION:  Initiate tube feeds via OG tube: -Initiate Vital 1.5 at 20 mL/hour and advance by 10 mL/hour every 8 hours to goal rate of 50 mL/hour (1200 mL goal daily volume) -Provide PROSource TF20 60 mL daily per tube -Provides: 1880 kcal, 101 grams of protein, 912 mL H2O daily  NUTRITION DIAGNOSIS:   Inadequate oral intake related to inability to eat as evidenced by NPO status.  GOAL:   Patient will meet greater than or equal to 90% of their needs  MONITOR:   Vent status, Labs, Weight trends, TF tolerance, I & O's  REASON FOR ASSESSMENT:   Ventilator, Consult Enteral/tube feeding initiation and management  ASSESSMENT:   62 year old female with PMHx of MCA aneurysm s/p coil embolism, ADHD, and seizure disorder admitted for status epilepticus, prolonged post ictal state requiring intubation.  9/9: intubated   Pt intubated and sedated at time of RD assessment. No family available at bedside. No documented allergies in chart. Plan is to start tube feeds today via OG tube. Per discussion with MD via secure chat will verify placement of OG tube with abdominal x-ray today prior to starting tube feeds.   Per review of weight history in chart pt was previously 85-88 kg in 2023. It appears weight has trended up. Currently 99.2 kg.  Patient is currently intubated on ventilator support MV: 9.8 L/min Temp (24hrs), Avg:98.1 F (36.7 C), Min:97.8 F (36.6 C), Max:98.2 F (36.8 C)  Propofol: 14.9 ml/hr (393 kcal daily)  Medications reviewed and include: Colace 100 mg BID, Novolog 0-9 units Q4hrs, pantoprazole, Miralax 17 grams daily per tube, D10 at 20 mL/hour, fentanyl gtt, LR at 50 mL/hour, Keppra, potassium phosphate 15 mmol once IV today, propofol gtt  Labs reviewed: CBG 72-107, CO2 21, Creatinine 1.07, Phosphorus 2.4  Enteral Access: 14 Fr. OG tube placed 9/8; tip over distal stomach per abdominal  x-ray 10/30/22  UOP: 475 mL UOP in previous 24 hours  I/O: +1052.8 mL since admission  Discussed with RN via secure chat.  NUTRITION - FOCUSED PHYSICAL EXAM:  Flowsheet Row Most Recent Value  Orbital Region No depletion  Upper Arm Region No depletion  Thoracic and Lumbar Region No depletion  Buccal Region Unable to assess  Temple Region No depletion  Clavicle Bone Region No depletion  Clavicle and Acromion Bone Region No depletion  Scapular Bone Region Unable to assess  Dorsal Hand No depletion  Patellar Region No depletion  Anterior Thigh Region No depletion  Posterior Calf Region No depletion  Edema (RD Assessment) Mild  Hair Reviewed  Eyes Unable to assess  Mouth Unable to assess  Skin Reviewed  Nails Reviewed      Diet Order:   Diet Order             Diet NPO time specified  Diet effective now                  EDUCATION NEEDS:   No education needs have been identified at this time  Skin:  Skin Assessment: Reviewed RN Assessment  Last BM:  Unknown/PTA  Height:   Ht Readings from Last 1 Encounters:  10/30/22 5\' 6"  (1.676 m)   Weight:   Wt Readings from Last 1 Encounters:  10/30/22 99.2 kg   Ideal Body Weight:  59.1 kg  BMI:  Body mass index is 35.3 kg/m.  Estimated Nutritional Needs:   Kcal:  1700-2000  Protein:  90-100 grams  Fluid:  1.7-2 L/day  Letta Median, MS, RD, LDN, CNSC Pager number available on Amion

## 2022-10-30 NOTE — Progress Notes (Addendum)
eLink Physician-Brief Progress Note Patient Name: Betty Archer DOB: [birth year] MRN: [id number]   Date of Service  10/30/2022  HPI/Events of Note  62 year old woman with a history of seizures and known left MCA aneurysm status postembolization in 2022 who is presenting after being found down at home found to be in status epilepticus who was eventually intubated.  Patient noted to be tachypneic, tachycardic, and hypertensive on initial workup.  Currently ventilated with 100% FiO2 saturating 100%.  Sedated with propofol and fentanyl.  Laboratory studies are pending.  CT head personally reviewed, evidence of hypodensities in the right frontal lobes but difficult to ascertain if these are new-read pending.  Chest radiograph with appropriate endotracheal tube positioning.  eICU Interventions  Continue with Keppra, plan of care EEG in place.  Will follow-up labs as they return.  Maintain deep sedation per neurology and primary team-currently utilizing propofol and fentanyl.  DVT prophylaxis with enoxaparin GI prophylaxis with pantoprazole.   0202 -continue Foley catheter, orders updated  0315 -lower CBGs, in the 70s will add on continuous D10 infusion at 20 cc, reduced rate of LR to 50  Intervention Category Evaluation Type: New Patient Evaluation  Lamar Naef 10/30/2022, 1:30 AM

## 2022-10-30 NOTE — Progress Notes (Signed)
Neurology Progress Note  Patient ID: JUNICE NEALLY is a 62 y.o. with PMHx of  has a past medical history of Brain aneurysm, Cerebral aneurysm, Cognitive communication deficit, Hemiparesis (HCC), Hemiplegia (HCC), Hyperlipidemia, Hyperplastic colon polyp, Internal hemorrhoids, Stroke (HCC), and TIA (transient ischemic attack).  HPI: TEANDRA MILANOWSKI is a 62 y.o. female with a history of aneurysm and previous stroke who presents with seizure.  She was seen in the emergency department on 8/2 and started on Keppra.  Tonight, she comes in following seizure activity at home.  She apparently had a seizure with a prolonged postictal period with EMS and she was given Versed and route.  She needed bagging as she was being transported without repeat seizure activity, but she was not protecting her airway on arrival and therefore was intubated.   Subjective: - limited by intubation - son notes she may have missed one dose of keppra just prior to admission (she was out late w/o her meds) but he isn't sure  Exam: Vitals:   10/30/22 1200 10/30/22 1600  BP:    Pulse:    Resp:    Temp: 97.8 F (36.6 C) 98.5 F (36.9 C)  SpO2:     Gen: In bed, comfortable  Resp: non-labored breathing, no grossly audible wheezing Cardiac: Perfusing extremities well  Abd: soft, nt  Neuro: MS: Sleeping, awakens to voice CN: EOMI to tracking examiner. Orients to voice bilaterally. PERRL  Motor: Squeezes hands to command and gives thumbs up bilaterally, good antigravity strength in BLE. In wrist restraints.  Sensory: equally reactive to touch in all 4   Pertinent Labs:  Basic Metabolic Panel: Recent Labs  Lab 10/30/22 0142 10/30/22 0511 10/30/22 0514  NA 143 140  --   K 4.0 3.9  --   CL  --  109  --   CO2  --  21*  --   GLUCOSE  --  103*  --   BUN  --  10  --   CREATININE  --  1.07*  --   CALCIUM  --  8.6*  --   MG  --   --  2.1  PHOS  --   --  2.4*    CBC: Recent Labs  Lab 10/30/22 0142  10/30/22 0511  WBC  --  11.8*  NEUTROABS  --  8.2*  HGB 12.2 11.0*  HCT 36.0 36.0  MCV  --  76.3*  PLT  --  189    Coagulation Studies: No results for input(s): "LABPROT", "INR" in the last 72 hours.    CT head personally reviewed, agree with radiology no acute intracranial process, bilateral right > left frontal chronic encephalomalacia .  Impression: Resolved status, potentially in the setting of missed medications  Recommendations: - Continue increase from home dose of Keppra 1000 mg BID (previously 500 BID) which is maxed for her renal function Estimated Creatinine Clearance: 64.8 mL/min (A) (by C-G formula based on SCr of 1.07 mg/dL (H)).  - Appreciate CCM management of  comordibities and consideration for extubation  - If extubated and mental status remains good, may d/c LTM (may wait till tomorrow morning, no need for lead maintenance overnight if still clinically doing well)  Brooke Dare MD-PhD [facility] [phone]   Discussed with CCM via secure chat  CRITICAL CARE Performed by: Gordy Councilman   Total critical care time: 35 minutes  Critical care time was exclusive of separately billable procedures and treating other patients.  Critical care was  necessary to treat or prevent imminent or life-threatening deterioration.  Critical care was time spent personally by me on the following activities: development of treatment plan with patient and/or surrogate as well as nursing, discussions with consultants, evaluation of patient's response to treatment, examination of patient, obtaining history from patient or surrogate, ordering and performing treatments and interventions, ordering and review of laboratory studies, ordering and review of radiographic studies, pulse oximetry and re-evaluation of patient's condition.

## 2022-10-30 NOTE — Progress Notes (Signed)
PCCM Brief Note  Sedation held around 1630. Pt on SBT and intermittently did well but would get somnolent again initially. Waited more time to allow sedation to wear off. Pt reassessed at 1725, tolerating SBT well. Great volumes up to 1.1L. Following all commands. Vitals stable.  Decision made to proceed with extubation which was performed while I was in the room.   Pt and family at bedside updated.   Rutherford Guys, PA - C [facility] For pager details, please see AMION or use Epic chat  After 1900, please call [facility] for cross coverage needs 10/30/2022, 6:25 PM

## 2022-10-30 NOTE — Progress Notes (Signed)
Pt transported from ER30 to 3M11 via ventilator with no complications

## 2022-10-30 NOTE — Procedures (Signed)
Patient Name: Betty Archer  MRN: [id number]  Epilepsy Attending: Charlsie Quest  Referring Physician/Provider: Lorin Glass, MD  Duration: 10/30/2022 1231 to [phone]  Patient history:  62 y.o. female with a history of aneurysm and previous stroke who presents with seizure getting rapid EEG to evaluate for seizure  Level of alertness: comatose  AEDs during EEG study: LEV, propofol  Technical aspects: This EEG was obtained using a 10 lead EEG system positioned circumferentially without any parasagittal coverage (rapid EEG). Computer selected EEG is reviewed as  well as background features and all clinically significant events.  Description: EEG showed continuous generalized 3 to 6 Hz theta-delta slowing admixed with an excessive amount of 12-13 Hz beta activity distributed symmetrically and diffusely. Hyperventilation and photic stimulation were not performed.     ABNORMALITY - Continuous slow, generalized  IMPRESSION: This limited ceribell EEG is suggestive of severe diffuse encephalopathy likely related to sedation. No seizures or epileptiform discharges were seen throughout the recording.  If suspicion for ictal-interictal activity remains a concern, a conventional EEG can be considered.   Keddrick Wyne Annabelle Harman

## 2022-10-30 NOTE — Progress Notes (Signed)
LTM EEG hooked up and running - no initial skin breakdown - push button tested - [facility] monitoring.  

## 2022-10-30 NOTE — Telephone Encounter (Signed)
Chico (patient son) wanted you to know his mother had two Seizures yesterday. Patient is currently at [facility] in ICU.    Chico was still trying to get an appointment for her at [facility]. He is unsure if he has missed the call for the appointment.    I check on the status of the referral and [facility] a call was made. They spoke with Mrs. Orban and [place] never got the message.   I have requested a call back to ask them to reach out again this time to [place]. Due to the patient's memory issues. And informed Chico she will be seen by Neuro while in the [facility]   Call back phone  [place] [phone].

## 2022-10-30 NOTE — Progress Notes (Signed)
Patient arrived to 69m11 from ED. Patient is on ventilator with fentanyl and propofol gtt's infusing through 3 PIV's in bilateral arms. Patients jewelry removed by this Clinical research associate and charge nurse and placed in a specimen cup which contained 2 grey bracelets, 2 grey necklaces, 1 yellow ring, and 2 yellow necklaces. Patients bra and shirt was cut per EMS and placed in patient belonging bag with the specimen cup of jewelry.Skin assessment done with charge RN and noted that patients skin was intact. Son and sister at bedside.

## 2022-10-30 NOTE — Procedures (Signed)
Extubation Procedure Note  Patient Details:   Name: Betty Archer DOB: [birth year] MRN: [id number]   Airway Documentation:    Vent end date: 10/30/22 Vent end time: 1734   Evaluation  O2 sats: stable throughout Complications: No apparent complications Patient did tolerate procedure well. Bilateral Breath Sounds: Clear, Diminished   Yes,  Per verbal order by CCM, RT extubated pt to [place]. Prior to extubation, CCM placed pt on CPAP/PS tolerating well. Pt also had a positive cuff leak. Pt tolerating well at this time, no complications, no stridor noted and pt was able to state her name.  Megan Mans 10/30/2022, 5:34 PM

## 2022-10-30 NOTE — Progress Notes (Signed)
Pt transported from ED 30 to CT and back without issues.

## 2022-10-30 NOTE — Progress Notes (Signed)
Patients son Betty Archer, phone number [phone] is the number 1 contact for patient along with his other two brothers. Numbers are listed in the patient chart. Please call him for any questions.

## 2022-10-30 NOTE — Progress Notes (Signed)
   NAME:  Betty Archer, MRN:  [id number], DOB:  12/11/1960, LOS: 1 ADMISSION DATE:  10/29/2022, CONSULTATION DATE:  10/30/22 REFERRING MD:  EDP, CHIEF COMPLAINT:  seizures   History of Present Illness:  This is a 62 year old woman with a history of seizures and known left MCA aneurysm status postembolization in 2022 who is presenting after being found down at home.  She had multiple witnessed seizures by EMS.  Received 7.5 mg of Ativan.  Not protecting her airway on arrival to ER so intubated.  Head CT is pending.  Pulmonary critical care medicine is consulted to admit patient.  Family and route, history otherwise per EMS and chart review.  Pertinent  Medical History  Prior stroke Seizures Bilateral MCA aneurysms s/p bilateral repair Hypertension hyperlipidemia  Significant Hospital Events: Including procedures, antibiotic start and stop dates in addition to other pertinent events   Consult 10/29/2022  Interim History / Subjective:  Sedated. Difficult to awaken this AM. Hypoglycemia overnight, started on D10.  Objective   Blood pressure 96/62, pulse 77, temperature 98.2 F (36.8 C), temperature source Oral, resp. rate 20, height 5\' 6"  (1.676 m), weight 99.2 kg, SpO2 100%.    Vent Mode: PRVC FiO2 (%):  [50 %-100 %] 50 % Set Rate:  [20 bmp] 20 bmp Vt Set:  [460 mL-510 mL] 510 mL PEEP:  [5 cmH20] 5 cmH20 Plateau Pressure:  [12 cmH20-18 cmH20] 18 cmH20   Intake/Output Summary (Last 24 hours) at 10/30/2022 0734 Last data filed at 10/30/2022 0700 Gross per 24 hour  Intake 1312.9 ml  Output 675 ml  Net 637.9 ml   Filed Weights   10/30/22 0121  Weight: 99.2 kg    Examination: General: Intubated, sedated, in NAD HENT: Betty Archer/AT. MMM. ETT in place Lungs: Normal effort. CTAB. Cardiovascular: RRR, no M/R/G. Abdomen: BS x 4. S/NT/ND. Extremities: There is no edema Neuro: Sedated, not responsive.  Assessment & Plan:   Presumed status epilepticus resulting in airway compromise status post  intubation History of prior seizures History of bilateral MCA aneurysms both coiled ADHD - Continue Keppra per Neuro. - Ceribell ongoing, likely to transition to routine EEG today. - Lighten sedation as able. - Continue ASA (off Brillinta as of last note Jan 2024 by Dr. Riley Kill). - Repeat lactate to ensure clearance.  Respiratory insufficiency/Ventilator dependence - 2/2 above. - Full vent support. - Wean as able though caution not to provoke further seizures. - Bronchial hygiene. - Follow CXR intermittently.   Best Practice (right click and "Reselect all SmartList Selections" daily)   Diet/type: NPO DVT prophylaxis: SCD lovenox GI prophylaxis: PPI Lines: N/A Foley:  Yes, and it is still needed Code Status:  full code Last date of multidisciplinary goals of care discussion [pending family arrival]  Critical care time: 35 mins    Betty Archer Betty Archer, [place] - [facility] For pager details, please see AMION or use Epic chat  After 1900, please call [facility] for cross coverage needs 10/30/2022, 7:48 AM

## 2022-10-31 DIAGNOSIS — G40901 Epilepsy, unspecified, not intractable, with status epilepticus: Secondary | ICD-10-CM | POA: Diagnosis not present

## 2022-10-31 LAB — CBC
HCT: 35.4 % — ABNORMAL LOW (ref 36.0–46.0)
Hemoglobin: 10.8 g/dL — ABNORMAL LOW (ref 12.0–15.0)
MCH: 23.3 pg — ABNORMAL LOW (ref 26.0–34.0)
MCHC: 30.5 g/dL (ref 30.0–36.0)
MCV: 76.3 fL — ABNORMAL LOW (ref 80.0–100.0)
Platelets: 173 10*3/uL (ref 150–400)
RBC: 4.64 MIL/uL (ref 3.87–5.11)
RDW: 15.7 % — ABNORMAL HIGH (ref 11.5–15.5)
WBC: 6.4 10*3/uL (ref 4.0–10.5)
nRBC: 0 % (ref 0.0–0.2)

## 2022-10-31 LAB — BASIC METABOLIC PANEL
Anion gap: 11 (ref 5–15)
BUN: 6 mg/dL — ABNORMAL LOW (ref 8–23)
CO2: 21 mmol/L — ABNORMAL LOW (ref 22–32)
Calcium: 8.5 mg/dL — ABNORMAL LOW (ref 8.9–10.3)
Chloride: 107 mmol/L (ref 98–111)
Creatinine, Ser: 0.88 mg/dL (ref 0.44–1.00)
GFR, Estimated: >60 mL/min (ref >60)
Glucose, Bld: 89 mg/dL (ref 70–99)
Potassium: 3.4 mmol/L — ABNORMAL LOW (ref 3.5–5.1)
Sodium: 139 mmol/L (ref 135–145)

## 2022-10-31 LAB — GLUCOSE, CAPILLARY
Glucose-Capillary: 93 mg/dL (ref 70–99)
Glucose-Capillary: 85 mg/dL (ref 70–99)
Glucose-Capillary: 88 mg/dL (ref 70–99)
Glucose-Capillary: 85 mg/dL (ref 70–99)
Glucose-Capillary: 109 mg/dL — ABNORMAL HIGH (ref 70–99)

## 2022-10-31 LAB — PHOSPHORUS: Phosphorus: 3.6 mg/dL (ref 2.5–4.6)

## 2022-10-31 LAB — MAGNESIUM: Magnesium: 2 mg/dL (ref 1.7–2.4)

## 2022-10-31 MED ORDER — PHENOL 1.4 % MT LIQD
1.0000 | OROMUCOSAL | Status: DC | PRN
  Filled 2022-10-31: qty 177

## 2022-10-31 MED ORDER — DOCUSATE SODIUM 100 MG PO CAPS: 100.0000 mg | ORAL_CAPSULE | Freq: Two times a day (BID) | ORAL | Status: DC | PRN

## 2022-10-31 MED ORDER — ATORVASTATIN CALCIUM 40 MG PO TABS
40.0000 mg | ORAL_TABLET | Freq: Every day | ORAL | Status: DC
  Administered 2022-10-31 – 2022-11-02 (×3): 40 mg via ORAL
  Filled 2022-10-31 (×3): qty 1

## 2022-10-31 MED ORDER — DOCUSATE SODIUM 100 MG PO CAPS
100.0000 mg | ORAL_CAPSULE | Freq: Two times a day (BID) | ORAL | Status: DC
  Administered 2022-10-31 – 2022-11-02 (×5): 100 mg via ORAL
  Filled 2022-10-31 (×5): qty 1

## 2022-10-31 MED ORDER — ASPIRIN 81 MG PO CHEW
81.0000 mg | CHEWABLE_TABLET | Freq: Every day | ORAL | Status: DC
  Administered 2022-10-31 – 2022-11-02 (×3): 81 mg via ORAL
  Filled 2022-10-31 (×3): qty 1

## 2022-10-31 MED ORDER — INSULIN ASPART 100 UNIT/ML IJ SOLN: 0.0000 [IU] | Freq: Every day | INTRAMUSCULAR | Status: DC

## 2022-10-31 MED ORDER — POLYETHYLENE GLYCOL 3350 17 G PO PACK
17.0000 g | PACK | Freq: Every day | ORAL | Status: DC
  Administered 2022-10-31 – 2022-11-01 (×2): 17 g via ORAL
  Filled 2022-10-31 (×3): qty 1

## 2022-10-31 MED ORDER — INSULIN ASPART 100 UNIT/ML IJ SOLN: 0.0000 [IU] | Freq: Three times a day (TID) | INTRAMUSCULAR | Status: DC

## 2022-10-31 MED ORDER — ACETAMINOPHEN 325 MG PO TABS
650.0000 mg | ORAL_TABLET | Freq: Four times a day (QID) | ORAL | Status: DC | PRN
  Filled 2022-10-31: qty 2

## 2022-10-31 MED ORDER — POTASSIUM CHLORIDE CRYS ER 20 MEQ PO TBCR
40.0000 meq | EXTENDED_RELEASE_TABLET | Freq: Once | ORAL | Status: AC
  Administered 2022-10-31: 40 meq via ORAL
  Filled 2022-10-31: qty 2

## 2022-10-31 NOTE — Procedures (Addendum)
Patient Name: Betty Archer  MRN: [id number]  Epilepsy Attending: Charlsie Quest  Referring Physician/Provider: Lorin Glass, MD  Duration: 10/30/2022 7253 to 10/31/2022 0931  Patient history: 62 y.o. female with a history of aneurysm and previous stroke who presents with seizure getting rapid EEG to evaluate for seizure   Level of alertness: comatose-->awake, asleep  AEDs during EEG study: LEV, propofol  Technical aspects: This EEG study was done with scalp electrodes positioned according to the 10-20 International system of electrode placement. Electrical activity was reviewed with band pass filter of 1-70Hz , sensitivity of 7 uV/mm, display speed of 20mm/sec with a 60Hz  notched filter applied as appropriate. EEG data were recorded continuously and digitally stored.  Video monitoring was available and reviewed as appropriate.  Description:  EEG showed continuous generalized 3 to 6 Hz theta-delta slowing admixed with an excessive amount of 12-13 Hz beta activity distributed symmetrically and diffusely. Gradually as sedation was weaned, EEG showed posterior dominant rhythm of 9-10 Hz activity of moderate voltage (25-35 uV) seen predominantly in posterior head regions, symmetric and reactive to eye opening and eye closing. Sleep was characterized by vertex waves, sleep spindles (12 to 14 Hz), maximal frontocentral region. EEG also showed intermittent generalized 3 to 6 Hz theta-delta slowing admixed with an excessive amount of 12-13 Hz beta activity distributed symmetrically and diffusely.  Hyperventilation and photic stimulation were not performed.     ABNORMALITY - Continuous slow, generalized  IMPRESSION: This study was initially suggestive of severe diffuse encephalopathy likely related to sedation. Gradually as sedation was weaned, EEG improved and was suggestive of mild diffuse encephalopathy. No seizures or epileptiform discharges were seen throughout the recording.  [employer] Annabelle Harman

## 2022-10-31 NOTE — Discharge Instructions (Signed)
Standard seizure precautions: Per [place] DMV statutes, patients with seizures are not allowed to drive until  they have been seizure-free for six months. Use caution when using heavy equipment or power tools. Avoid working on ladders or at heights. Take showers instead of baths. Ensure the water temperature is not too high on the home water heater. Do not go swimming alone. When caring for infants or small children, sit down when holding, feeding, or changing them to minimize risk of injury to the child in the event you have a seizure.  To reduce risk of seizures, maintain good sleep hygiene avoid alcohol and illicit drug use, take all anti-seizure medications as prescribed.  

## 2022-10-31 NOTE — Progress Notes (Signed)
eLink Physician-Brief Progress Note Patient Name: Betty Archer DOB: Jun 13, 1960 MRN: [id number]   Date of Service  10/31/2022  HPI/Events of Note  New Headaches  eICU Interventions  Add tylenol PRN     Intervention Category Minor Interventions: Routine modifications to care plan (e.g. PRN medications for pain, fever)  [place] 10/31/2022, 1:28 AM

## 2022-10-31 NOTE — Progress Notes (Addendum)
   NAME:  Betty Archer, MRN:  [id number], DOB:  02-24-1960, LOS: 2 ADMISSION DATE:  10/29/2022, CONSULTATION DATE:  10/30/22 REFERRING MD:  EDP, CHIEF COMPLAINT:  seizures   History of Present Illness:  This is a 62 year old woman with a history of seizures and known left MCA aneurysm status postembolization in 2022 who is presenting after being found down at home.  She had multiple witnessed seizures by EMS.  Received 7.5 mg of Ativan.  Not protecting her airway on arrival to ER so intubated.  Head CT is pending.  Pulmonary critical care medicine is consulted to admit patient.  Family and route, history otherwise per EMS and chart review.  Pertinent  Medical History  Prior stroke Seizures Bilateral MCA aneurysms s/p bilateral repair Hypertension hyperlipidemia  Significant Hospital Events: Including procedures, antibiotic start and stop dates in addition to other pertinent events   Consult 10/29/2022 9/9 extubated  Interim History / Subjective:  Extubated successfully yesterday. Has mild throat discomfort this AM, otherwise no complaints.  Objective   Blood pressure 126/80, pulse 84, temperature 99.3 F (37.4 C), temperature source Oral, resp. rate 16, height 5\' 6"  (1.676 m), weight 100 kg, SpO2 92%.    Vent Mode: PRVC FiO2 (%):  [40 %] 40 % Set Rate:  [20 bmp] 20 bmp Vt Set:  [510 mL] 510 mL PEEP:  [5 cmH20] 5 cmH20 Plateau Pressure:  [20 cmH20] 20 cmH20   Intake/Output Summary (Last 24 hours) at 10/31/2022 0800 Last data filed at 10/31/2022 0600 Gross per 24 hour  Intake 2589.57 ml  Output 1595 ml  Net 994.57 ml   Filed Weights   10/30/22 0121 10/31/22 0500  Weight: 99.2 kg 100 kg    Examination: General: Adult female, no distress HENT: [place]/AT. MMM. Good speech post extubation Lungs: Normal effort. CTAB. Cardiovascular: RRR, no M/R/G. Abdomen: BS x 4. S/NT/ND. Extremities: There is no edema Neuro: A&O x 3, no deficits.  Assessment & Plan:   Presumed status  epilepticus resulting in airway compromise status post intubation - LTM without ongoing epileptiform discharges History of prior seizures History of bilateral MCA aneurysms both coiled ADHD - Continue Keppra per Neuro. - Probable d/c LTM today. - Continue ASA (off Brillinta as of last note Jan 2024 by Dr. Riley Kill).  Respiratory insufficiency/Ventilator dependence - 2/2 above. Resolved and extubated successfully 9/9. - Supportive care.  Hypokalemia. - 40 mEq K x 1. - Follow BMP.  Tx to med surge and TRH takeover 9/11. Communicated with TRH team. PCCM off. Please call with questions or further needs.   Best Practice (right click and "Reselect all SmartList Selections" daily)   Diet/type: Regular consistency (see orders) DVT prophylaxis: SCD lovenox GI prophylaxis: N/A Lines: N/A Foley:  Yes, and it is still needed Code Status:  full code Last date of multidisciplinary goals of care discussion [pending family arrival]  Critical care time: N/A    Rutherford Guys, PA - [facility] For pager details, please see AMION or use Epic chat  After 1900, please call [facility] for cross coverage needs 10/31/2022, 8:00 AM

## 2022-10-31 NOTE — Progress Notes (Signed)
Neurology Progress Note  Patient ID: Betty Archer is a 62 y.o. with PMHx of  has a past medical history of Brain aneurysm, Cerebral aneurysm, Cognitive communication deficit, Hemiparesis (HCC), Hemiplegia (HCC), Hyperlipidemia, Hyperplastic colon polyp, Internal hemorrhoids, Stroke (HCC), and TIA (transient ischemic attack).  HPI: Betty Archer is a 62 y.o. female with a history of aneurysm and previous stroke who presents with seizure.  She was seen in the emergency department on 8/2 and started on Keppra.  On 9/8, she came in following seizure activity at home.  She apparently had a seizure with a prolonged postictal period with EMS and she was given Versed and route.  She needed bagging as she was being transported without repeat seizure activity, but she was not protecting her airway on arrival and therefore was intubated; extubated 9/9 evening.   Subjective: -Reports she ran out of her Keppra  Exam: Vitals:   10/31/22 0700 10/31/22 0740  BP: 126/80   Pulse: 84   Resp: 16   Temp:  99.3 F (37.4 C)  SpO2: 92%    Gen: In bed, comfortable  Resp: non-labored breathing, no grossly audible wheezing Cardiac: Perfusing extremities well  Abd: soft, nt  Neuro: MS: Awake, alert, oriented to situation CN: Face symmetric, tracks examiner, hearing intact to voice Motor: Using bilateral upper extremities equally   Pertinent Labs:  Basic Metabolic Panel: Recent Labs  Lab 10/30/22 0142 10/30/22 0511 10/30/22 0514 10/31/22 0619  NA 143 140  --  139  K 4.0 3.9  --  3.4*  CL  --  109  --  107  CO2  --  21*  --  21*  GLUCOSE  --  103*  --  89  BUN  --  10  --  6*  CREATININE  --  1.07*  --  0.88  CALCIUM  --  8.6*  --  8.5*  MG  --   --  2.1 2.0  PHOS  --   --  2.4* 3.6    CBC: Recent Labs  Lab 10/30/22 0142 10/30/22 0511 10/31/22 0619  WBC  --  11.8* 6.4  NEUTROABS  --  8.2*  --   HGB 12.2 11.0* 10.8*  HCT 36.0 36.0 35.4*  MCV  --  76.3* 76.3*  PLT  --  189 173     Coagulation Studies: No results for input(s): "LABPROT", "INR" in the last 72 hours.    CT head personally reviewed, agree with radiology no acute intracranial process, bilateral right > left frontal chronic encephalomalacia .  Impression: Resolved status, in the setting of missed medications  Recommendations: - Continue increase from home dose of Keppra 1000 mg BID (previously 500 BID) which is maxed for her renal function, as she is tolerating this medication well Estimated Creatinine Clearance: 79.1 mL/min (by C-G formula based on SCr of 0.88 mg/dL).  - DC LTM EEG - Seizure precautions discussed with patient and family, please include in discharge instructions - Inpatient neurology will sign off at this time, please do not hesitate to reach out if additional questions and concerns arise including if patient has further seizure activity  Standard seizure precautions: Per [facility] statutes, patients with seizures are not allowed to drive until  they have been seizure-free for six months. Use caution when using heavy equipment or power tools. Avoid working on ladders or at heights. Take showers instead of baths. Ensure the water temperature is not too high on the home water heater.  Do not go swimming alone. When caring for infants or small children, sit down when holding, feeding, or changing them to minimize risk of injury to the child in the event you have a seizure.  To reduce risk of seizures, maintain good sleep hygiene avoid alcohol and illicit drug use, take all anti-seizure medications as prescribed.   Brooke Dare MD-PhD [facility] [phone]

## 2022-10-31 NOTE — Plan of Care (Signed)
  Problem: Coping: Goal: Ability to adjust to condition or change in health will improve Outcome: Progressing   Problem: Health Behavior/Discharge Planning: Goal: Ability to manage health-related needs will improve Outcome: Progressing   Problem: Fluid Volume: Goal: Ability to maintain a balanced intake and output will improve Outcome: Progressing   Problem: Metabolic: Goal: Ability to maintain appropriate glucose levels will improve Outcome: Progressing   Problem: Skin Integrity: Goal: Risk for impaired skin integrity will decrease Outcome: Progressing   Problem: Education: Goal: Knowledge of General Education information will improve Description: Including pain rating scale, medication(s)/side effects and non-pharmacologic comfort measures Outcome: Progressing   Problem: Clinical Measurements: Goal: Will remain free from infection Outcome: Progressing   Problem: Coping: Goal: Level of anxiety will decrease Outcome: Progressing   Problem: Pain Managment: Goal: General experience of comfort will improve Outcome: Progressing   Problem: Safety: Goal: Ability to remain free from injury will improve Outcome: Progressing   Problem: Skin Integrity: Goal: Risk for impaired skin integrity will decrease Outcome: Progressing

## 2022-10-31 NOTE — Progress Notes (Signed)
LTM maint complete - no skin breakdown under:   

## 2022-10-31 NOTE — Progress Notes (Signed)
LTM EEG disconnected - no skin breakdown at unhook.  

## 2022-10-31 NOTE — Progress Notes (Signed)
Patient transferred from ICU on the floor at 1415PM via bed. Alert and oriented. On room air. Will continue to monitor

## 2022-11-01 ENCOUNTER — Encounter (HOSPITAL_COMMUNITY): Payer: Self-pay | Admitting: Internal Medicine

## 2022-11-01 ENCOUNTER — Other Ambulatory Visit: Payer: Self-pay

## 2022-11-01 DIAGNOSIS — G40901 Epilepsy, unspecified, not intractable, with status epilepticus: Secondary | ICD-10-CM | POA: Diagnosis not present

## 2022-11-01 LAB — URINE DRUGS OF ABUSE SCREEN W ALC, ROUTINE (REF LAB)
Amphetamines, Urine: NEGATIVE ng/mL
Barbiturate, Ur: NEGATIVE ng/mL
Cannabinoid Quant, Ur: NEGATIVE ng/mL
Cocaine (Metab.): NEGATIVE ng/mL
Ethanol U, Quan: NEGATIVE %
Methadone Screen, Urine: NEGATIVE ng/mL
Opiate Quant, Ur: NEGATIVE ng/mL
Phencyclidine, Ur: NEGATIVE ng/mL
Propoxyphene, Urine: NEGATIVE ng/mL

## 2022-11-01 LAB — BASIC METABOLIC PANEL
Anion gap: 6 (ref 5–15)
BUN: 7 mg/dL — ABNORMAL LOW (ref 8–23)
CO2: 23 mmol/L (ref 22–32)
Calcium: 8.4 mg/dL — ABNORMAL LOW (ref 8.9–10.3)
Chloride: 111 mmol/L (ref 98–111)
Creatinine, Ser: 0.94 mg/dL (ref 0.44–1.00)
GFR, Estimated: >60 mL/min (ref >60)
Glucose, Bld: 108 mg/dL — ABNORMAL HIGH (ref 70–99)
Potassium: 3.9 mmol/L (ref 3.5–5.1)
Sodium: 140 mmol/L (ref 135–145)

## 2022-11-01 LAB — GLUCOSE, CAPILLARY
Glucose-Capillary: 104 mg/dL — ABNORMAL HIGH (ref 70–99)
Glucose-Capillary: 106 mg/dL — ABNORMAL HIGH (ref 70–99)
Glucose-Capillary: 106 mg/dL — ABNORMAL HIGH (ref 70–99)
Glucose-Capillary: 107 mg/dL — ABNORMAL HIGH (ref 70–99)
Glucose-Capillary: 131 mg/dL — ABNORMAL HIGH (ref 70–99)
Glucose-Capillary: 85 mg/dL (ref 70–99)
Glucose-Capillary: 86 mg/dL (ref 70–99)

## 2022-11-01 LAB — DRUG PROFILE 799031: BENZODIAZEPINES: NEGATIVE

## 2022-11-01 LAB — CBC
HCT: 34 % — ABNORMAL LOW (ref 36.0–46.0)
Hemoglobin: 10.6 g/dL — ABNORMAL LOW (ref 12.0–15.0)
MCH: 24.3 pg — ABNORMAL LOW (ref 26.0–34.0)
MCHC: 31.2 g/dL (ref 30.0–36.0)
MCV: 77.8 fL — ABNORMAL LOW (ref 80.0–100.0)
Platelets: 164 10*3/uL (ref 150–400)
RBC: 4.37 MIL/uL (ref 3.87–5.11)
RDW: 15.5 % (ref 11.5–15.5)
WBC: 5.6 10*3/uL (ref 4.0–10.5)
nRBC: 0 % (ref 0.0–0.2)

## 2022-11-01 LAB — MAGNESIUM: Magnesium: 2.1 mg/dL (ref 1.7–2.4)

## 2022-11-01 LAB — PHOSPHORUS: Phosphorus: 3.7 mg/dL (ref 2.5–4.6)

## 2022-11-01 MED ORDER — LEVETIRACETAM 500 MG PO TABS
1000.0000 mg | ORAL_TABLET | Freq: Two times a day (BID) | ORAL | Status: DC
  Administered 2022-11-01 – 2022-11-02 (×3): 1000 mg via ORAL
  Filled 2022-11-01 (×3): qty 2

## 2022-11-01 MED ORDER — BUPROPION HCL ER (XL) 150 MG PO TB24
300.0000 mg | ORAL_TABLET | Freq: Every day | ORAL | Status: DC
  Administered 2022-11-01 – 2022-11-02 (×2): 300 mg via ORAL
  Filled 2022-11-01 (×2): qty 2

## 2022-11-01 NOTE — Progress Notes (Signed)
PROGRESS NOTE    Betty Archer  JKK:[id number] DOB: 1960/08/16 DOA: 10/29/2022 PCP: Verlee Rossetti, PA-C    Brief Narrative:   Betty Archer is a 62 y.o. female with past medical history significant for seizure disorder, history of left MCA aneurysm s/p embolization 2022, history of CVA who presented to [facility] ED on 9/8 from home via EMS after being found down at home.  She had multiple witnessed seizures by EMS, received 7.5 mg of Ativan and transported to the ED for further evaluation.  On arrival to the ED, patient was not protecting her airway so she was intubated and placed on ventilatory support.  Temperature 98.2 F, HR 145, RR 16, BP 150/93, SpO2 100% on mechanical ventilation.  ABG with pH 7.326, pCO2 39.6, pO2 407.  WBC 11.8, hemoglobin low 0.0, platelet count 189.  Sodium 140, potassium 3.9, chloride 109, CO2 21, glucose 103, BUN 10, creat 1.07. Lactic acid 14.0.  Acetaminophen level less than 10, salicylate level less than 7.0.  TSH 0.544, free T40.97.  COVID PCR negative.  UDS positive for benzos.  CT head without contrast with no acute intracranial Gershon Mussel, encephalomalacia with both anterior cerebral artery territories, right greater than left, coil pack in the region of the anterior communicating artery, aneurysm web device at the left MCA bifurcation.  Chest x-ray with low lung volume with minimal basilar atelectasis, left greater than right, possible tiny left pleural effusion.  Pulmonary critical care medicine consulted for admission with concern for status epilepticus.  Significant Hospital events: 9/8: Admit to PCCM, on ventilatory support with concern for status epilepticus, neurology consulted, EEG with no seizure or epileptiform discharges, severe diffuse encephalopathy; Keppra increased to 1000 mg twice daily 9/9: Extubated 9/11: Transferred to hospitalist service, pending PT/OT evaluation  Assessment & Plan:   Status epilepticus resulting in airway compromise requiring  ventilatory support Patient presenting to the ED after being found down unresponsive by family members, on EMS arrival patient was noted to be having active seizures and was given Ativan and transported to the ED for further evaluation.  On arrival to the ED patient was not protecting her airway and she was subsequently intubated and placed on ventilatory support.  Patient was initially admitted to the pulmonary critical care medicine service with consultation with neurology.  Patient underwent EEG with findings of severe diffuse encephalopathy likely related to sedation, no seizures or epileptiform discharges noted.  Patient's home Keppra was increased to 1000 mg twice daily.  Patient was successfully extubated on 10/30/2022. --Continue Keppra 1000 mg p.o. twice daily -- Pending PT/OT evaluation -- Outpatient follow-up with neurology  Standard seizure precautions: Per [facility] statutes, patients with seizures are not allowed to drive until  they have been seizure-free for six months. Use caution when using heavy equipment or power tools. Avoid working on ladders or at heights. Take showers instead of baths. Ensure the water temperature is not too high on the home water heater. Do not go swimming alone. When caring for infants or small children, sit down when holding, feeding, or changing them to minimize risk of injury to the child in the event you have a seizure.  To reduce risk of seizures, maintain good sleep hygiene avoid alcohol and illicit drug use, take all anti-seizure medications as prescribed.   Hypokalemia Repleted.  Depression/anxiety --Bupropion 300 mg p.o. daily  HLD -- Atorvastatin 80 mg p.o. nightly   DVT prophylaxis: enoxaparin (LOVENOX) injection 40 mg Start: 10/30/22 1000 SCDs Start: 10/29/22  2342    Code Status: Full Code Family Communication: Family present at bedside  Disposition Plan:  Level of care: Med-Surg Status is: Inpatient Remains inpatient  appropriate because: Awaiting PT/OT evaluation, anticipate discharge home likely later today versus tomorrow    Consultants:  PCCM Neurology  Procedures:  EEG  Antimicrobials:  None   Subjective: Patient seen examined bedside, resting calmly.  Lying in bed.  Eating breakfast.  Reports she has not gotten out of bed since her hospitalization and feels somewhat weak.  Awaiting PT/OT evaluation.  Discussed increased dose of her Keppra when she goes home.  No other specific questions or concerns at this time.  Denies headache, no dizziness, no chest pain, no palpitations, no shortness of breath, no abdominal pain, no fever/chills/night sweats, no nausea/vomiting/diarrhea, no focal weakness, no fatigue, no paresthesia.  No acute events overnight per nursing staff.  Objective: Vitals:   11/01/22 0022 11/01/22 0505 11/01/22 0611 11/01/22 0745  BP: 132/79  135/86 125/78  Pulse: 63  79 81  Resp: 18  17 17   Temp: 99.3 F (37.4 C)  98.7 F (37.1 C) 98.5 F (36.9 C)  TempSrc: Oral  Oral Oral  SpO2: 94%  100% 100%  Weight:  104.9 kg    Height:        Intake/Output Summary (Last 24 hours) at 11/01/2022 1005 Last data filed at 11/01/2022 0825 Gross per 24 hour  Intake 1382.9 ml  Output 1200 ml  Net 182.9 ml   Filed Weights   10/30/22 0121 10/31/22 0500 11/01/22 0505  Weight: 99.2 kg 100 kg 104.9 kg    Examination:  Physical Exam: GEN: NAD, alert and oriented x 3, wd/wn HEENT: NCAT, PERRL, EOMI, sclera clear, MMM PULM: CTAB w/o wheezes/crackles, normal respiratory effort, on room air CV: RRR w/o M/G/R GI: abd soft, NTND, NABS, no R/G/M MSK: no peripheral edema, muscle strength globally intact 5/5 bilateral upper/lower extremities NEURO: CN II-XII intact, no focal deficits, sensation to light touch intact PSYCH: normal mood/affect Integumentary: dry/intact, no rashes or wounds    Data Reviewed: I have personally reviewed following labs and imaging studies  CBC: Recent Labs   Lab 10/30/22 0142 10/30/22 0511 10/31/22 0619 11/01/22 0821  WBC  --  11.8* 6.4 5.6  NEUTROABS  --  8.2*  --   --   HGB 12.2 11.0* 10.8* 10.6*  HCT 36.0 36.0 35.4* 34.0*  MCV  --  76.3* 76.3* 77.8*  PLT  --  189 173 164   Basic Metabolic Panel: Recent Labs  Lab 10/30/22 0142 10/30/22 0511 10/30/22 0514 10/31/22 0619  NA 143 140  --  139  K 4.0 3.9  --  3.4*  CL  --  109  --  107  CO2  --  21*  --  21*  GLUCOSE  --  103*  --  89  BUN  --  10  --  6*  CREATININE  --  1.07*  --  0.88  CALCIUM  --  8.6*  --  8.5*  MG  --   --  2.1 2.0  PHOS  --   --  2.4* 3.6   GFR: Estimated Creatinine Clearance: 81.1 mL/min (by C-G formula based on SCr of 0.88 mg/dL). Liver Function Tests: Recent Labs  Lab 10/30/22 0511  AST 25  ALT 24  ALKPHOS 81  BILITOT <0.1*  PROT 6.2*  ALBUMIN 3.1*   Recent Labs  Lab 10/30/22 0511  LIPASE 22   No results for  input(s): "AMMONIA" in the last 168 hours. Coagulation Profile: No results for input(s): "INR", "PROTIME" in the last 168 hours. Cardiac Enzymes: Recent Labs  Lab 10/30/22 0511  CKTOTAL 361*   BNP (last 3 results) No results for input(s): "PROBNP" in the last 8760 hours. HbA1C: Recent Labs    10/30/22 0511  HGBA1C 5.8*   CBG: Recent Labs  Lab 10/31/22 2017 11/01/22 0054 11/01/22 0402 11/01/22 0626 11/01/22 0744  GLUCAP 109* 131* 85 86 106*   Lipid Profile: Recent Labs    10/30/22 0514  TRIG 83   Thyroid Function Tests: Recent Labs    10/30/22 0511  TSH 0.544  FREET4 0.97   Anemia Panel: No results for input(s): "VITAMINB12", "FOLATE", "FERRITIN", "TIBC", "IRON", "RETICCTPCT" in the last 72 hours. Sepsis Labs: Recent Labs  Lab 10/30/22 0001 10/30/22 0853  LATICACIDVEN 14.0* 2.5*    Recent Results (from the past 240 hour(s))  MRSA Next Gen by PCR, Nasal     Status: None   Collection Time: 10/30/22  1:54 AM  Result Value Ref Range Status   MRSA by PCR Next Gen NOT DETECTED NOT DETECTED Final     Comment: (NOTE) The GeneXpert MRSA Assay (FDA approved for NASAL specimens only), is one component of a comprehensive MRSA colonization surveillance program. It is not intended to diagnose MRSA infection nor to guide or monitor treatment for MRSA infections. Test performance is not FDA approved in patients less than 26 years old. Performed at [facility], [address] [place], [place] [address]   SARS Coronavirus 2 by RT PCR (hospital order, performed in [facility] hospital lab) *cepheid single result test*     Status: None   Collection Time: 10/30/22  2:06 AM  Result Value Ref Range Status   SARS Coronavirus 2 by RT PCR NEGATIVE NEGATIVE Final    Comment: Performed at [facility], [address] [place], [place] [address]         Radiology Studies: Overnight EEG with video  Result Date: 10/31/2022 Charlsie Quest, MD     10/31/2022  9:42 AM Patient Name: Betty Archer MRN: [id number] Epilepsy Attending: Charlsie Quest Referring Physician/Provider: Lorin Glass, MD Duration: 10/30/2022 4132 to 10/31/2022 0931 Patient history: 62 y.o. female with a history of aneurysm and previous stroke who presents with seizure getting rapid EEG to evaluate for seizure Level of alertness: comatose-->awake, asleep AEDs during EEG study: LEV, propofol Technical aspects: This EEG study was done with scalp electrodes positioned according to the 10-20 International system of electrode placement. Electrical activity was reviewed with band pass filter of 1-70Hz , sensitivity of 7 uV/mm, display speed of 16mm/sec with a 60Hz  notched filter applied as appropriate. EEG data were recorded continuously and digitally stored.  Video monitoring was available and reviewed as appropriate. Description:  EEG showed continuous generalized 3 to 6 Hz theta-delta slowing admixed with an excessive amount of 12-13 Hz beta activity distributed symmetrically and diffusely. Gradually as sedation was weaned, EEG  showed posterior dominant rhythm of 9-10 Hz activity of moderate voltage (25-35 uV) seen predominantly in posterior head regions, symmetric and reactive to eye opening and eye closing. Sleep was characterized by vertex waves, sleep spindles (12 to 14 Hz), maximal frontocentral region. EEG also showed intermittent generalized 3 to 6 Hz theta-delta slowing admixed with an excessive amount of 12-13 Hz beta activity distributed symmetrically and diffusely.  Hyperventilation and photic stimulation were not performed.   ABNORMALITY - Continuous slow,  generalized IMPRESSION: This study was initially suggestive of severe diffuse encephalopathy likely related to sedation. Gradually as sedation was weaned, EEG improved and was suggestive of mild diffuse encephalopathy. No seizures or epileptiform discharges were seen throughout the recording. Charlsie Quest   DG Abd 1 View  Result Date: 10/30/2022 CLINICAL DATA:  Feeding tube placement. EXAM: ABDOMEN - 1 VIEW COMPARISON:  Chest x-ray 10/29/2022 FINDINGS: Exam demonstrates evidence of patient's enteric tube with tip just right of midline in the epigastric region likely over the distal stomach. Bowel gas pattern is nonobstructive. Remainder of the exam is unchanged. IMPRESSION: Enteric tube with tip over the distal stomach. Electronically Signed   By: Elberta Fortis M.D.   On: 10/30/2022 15:37        Scheduled Meds:  aspirin  81 mg Oral Daily   atorvastatin  40 mg Oral Daily   Chlorhexidine Gluconate Cloth  6 each Topical Q0600   docusate sodium  100 mg Oral BID   enoxaparin (LOVENOX) injection  40 mg Subcutaneous Daily   insulin aspart  0-5 Units Subcutaneous QHS   insulin aspart  0-9 Units Subcutaneous TID WC   levETIRAcetam  1,000 mg Oral BID   polyethylene glycol  17 g Oral Daily   Continuous Infusions:   LOS: 3 days    Time spent: 51 minutes spent on chart review, discussion with nursing staff, consultants, updating family and interview/physical  exam; more than 50% of that time was spent in counseling and/or coordination of care.    Alvira Philips [place], DO [facility] Available via Epic secure chat 7am-7pm After these hours, please refer to coverage provider listed on [email or web address] 11/01/2022, 10:05 AM

## 2022-11-01 NOTE — Evaluation (Signed)
Occupational Therapy Evaluation Patient Details Name: Betty Archer MRN: [id number] DOB: 10/24/1960 Today's Date: 11/01/2022   History of Present Illness Pt is a 62 y.o. female presenting 10/29/2022 after having two seizures. Intubated for airway protection 9/8 and extubated 9/9. EEG suggestive of mild diffuse encephalopathy; no seizures or epileptiform discharges noted.  PMH significant for HLD, TIA, CVA, ACA aneurysm.   Clinical Impression   PTA, pt lived with sons and reports she was mod I; upon son arrival, he reports pt with recent errors in pt's medication management. Pt presenting with decreased balance, safety, awareness, and cognition. Pt needing supervision-CGA for OOB ADL. Pt with decr attention on R observed to bump into objects in hall, but good ability to compensate to get around obstacle. Son with concerns about return home, but does not seem open to rehabilitation. Encouraged supervision with medication management and stair navigation at home as well as more frequent supervision initially. Recommending HHOT at discharge.        If plan is discharge home, recommend the following: A little help with walking and/or transfers;A little help with bathing/dressing/bathroom;Direct supervision/assist for medications management;Direct supervision/assist for financial management;Assist for transportation;Assistance with cooking/housework    Functional Status Assessment  Patient has had a recent decline in their functional status and demonstrates the ability to make significant improvements in function in a reasonable and predictable amount of time.  Equipment Recommendations  BSC/3in1    Recommendations for Other Services PT consult     Precautions / Restrictions Precautions Precautions: Fall Restrictions Weight Bearing Restrictions: No      Mobility Bed Mobility Overal bed mobility: Modified Independent                  Transfers Overall transfer level: Needs  assistance Equipment used: Rolling walker (2 wheels), None Transfers: Sit to/from Stand Sit to Stand: Supervision           General transfer comment: for safety      Balance Overall balance assessment: Needs assistance Sitting-balance support: No upper extremity supported, Feet supported Sitting balance-Leahy Scale: Good     Standing balance support: Bilateral upper extremity supported, No upper extremity supported, During functional activity Standing balance-Leahy Scale: Poor Standing balance comment: reliant on RW during dynamic mobility                           ADL either performed or assessed with clinical judgement   ADL Overall ADL's : Needs assistance/impaired Eating/Feeding: Modified independent   Grooming: Supervision/safety;Standing   Upper Body Bathing: Set up;Sitting   Lower Body Bathing: Supervison/ safety;Sit to/from stand   Upper Body Dressing : Set up;Sitting   Lower Body Dressing: Supervision/safety;Sit to/from stand   Toilet Transfer: Contact guard assist;Rolling walker (2 wheels);Ambulation Toilet Transfer Details (indicate cue type and reason): slightly impulsive up in room with RW Toileting- Clothing Manipulation and Hygiene: Supervision/safety;Sit to/from stand       Functional mobility during ADLs: Contact guard assist;Rolling walker (2 wheels)       Vision Baseline Vision/History:  (reading glasses) Ability to See in Adequate Light: 0 Adequate Patient Visual Report: No change from baseline Vision Assessment?: Vision impaired- to be further tested in functional context Additional Comments: bumping into objects on the L. Decreased overall smoothness of tracking with additional eye movements observed. Slight difference in inferior field on R when she detects objects. Unsure if bumping into objects on R is most closely related to decr attention  Perception Perception:  (Decr attention to R bumping into objects in hall)        Praxis         Pertinent Vitals/Pain Pain Assessment Pain Assessment: Faces Faces Pain Scale: Hurts a little bit Pain Location: RLE Pain Descriptors / Indicators: Aching Pain Intervention(s): Limited activity within patient's tolerance, Monitored during session     Extremity/Trunk Assessment Upper Extremity Assessment Upper Extremity Assessment: Generalized weakness RUE Deficits / Details: weaker as compared to L, but using functionally.   Lower Extremity Assessment Lower Extremity Assessment: Defer to PT evaluation       Communication Communication Communication: No apparent difficulties Cueing Techniques: Verbal cues   Cognition Arousal: Alert Behavior During Therapy: WFL for tasks assessed/performed Overall Cognitive Status: Impaired/Different from baseline Area of Impairment: Awareness, Safety/judgement, Following commands, Problem solving, Memory, Attention                   Current Attention Level: Sustained, Selective (able to attend to OT with TV on initially but unable to sustain this without cues for longer periods) Memory: Decreased short-term memory Following Commands: Follows one step commands consistently, Follows one step commands with increased time, Follows multi-step commands inconsistently Safety/Judgement: Decreased awareness of deficits, Decreased awareness of safety Awareness: Emergent (fair awareness that balance is different after having been up) Problem Solving: Requires verbal cues, Requires tactile cues, Slow processing General Comments: Pt needing min cues for attention to task intermittently. Good way finding around nurses station and going to door of a family member's room to say hello, however, poor awareness of deficits with frequent bumping into objects on R but no observed use or adoption of compensatory techniques. Pt does however, compensate well once she has bumped into an object can compensate to go around.     General Comments        Exercises     Shoulder Instructions      Home Living Family/patient expects to be discharged to:: Private residence Living Arrangements: Children Available Help at Discharge: Family Type of Home: Apartment (condo) Home Access: Stairs to enter Secretary/administrator of Steps: 15 Entrance Stairs-Rails: Left Home Layout: One level     Bathroom Shower/Tub: Producer, television/film/video: Standard     Home Equipment: Agricultural consultant (2 wheels);Shower seat   Additional Comments: pt reports two sons live with her in which one works during day and other works at night      Prior Functioning/Environment Prior Level of Function : Independent/Modified Independent;Driving             Mobility Comments: Pt reports intermittent use of RW when she feels like she needs it ADLs Comments: Pt reports independent in ADL, IADL, driving, and medication management.        OT Problem List: Decreased strength;Impaired balance (sitting and/or standing)      OT Treatment/Interventions: Self-care/ADL training;Therapeutic exercise;DME and/or AE instruction;Balance training;Patient/family education;Therapeutic activities;Cognitive remediation/compensation    OT Goals(Current goals can be found in the care plan section) Acute Rehab OT Goals Patient Stated Goal: go home OT Goal Formulation: With patient Time For Goal Achievement: 11/15/22 Potential to Achieve Goals: Good  OT Frequency: Min 1X/week    Co-evaluation              AM-PAC OT "6 Clicks" Daily Activity     Outcome Measure Help from another person eating meals?: None Help from another person taking care of personal grooming?: A Little Help from another person toileting, which includes using  toliet, bedpan, or urinal?: A Little Help from another person bathing (including washing, rinsing, drying)?: A Little Help from another person to put on and taking off regular upper body clothing?: A Little Help from another  person to put on and taking off regular lower body clothing?: A Little 6 Click Score: 19   End of Session Equipment Utilized During Treatment: Gait belt;Rolling walker (2 wheels) Nurse Communication: Mobility status  Activity Tolerance: Patient tolerated treatment well Patient left: in bed;with call bell/phone within reach;with bed alarm set;with family/visitor present  OT Visit Diagnosis: Unsteadiness on feet (R26.81);Muscle weakness (generalized) (M62.81);Other symptoms and signs involving cognitive function;Low vision, both eyes (H54.2)                Time: 1025-1057 OT Time Calculation (min): 32 min Charges:  OT General Charges $OT Visit: 1 Visit OT Evaluation $OT Eval Moderate Complexity: 1 Mod OT Treatments $Self Care/Home Management : 8-22 mins  [name], OTR/L [facility] Acute Rehabilitation Office: [phone]   Betty Archer 11/01/2022, 1:47 PM

## 2022-11-01 NOTE — Plan of Care (Signed)
  Problem: Education: Goal: Ability to describe self-care measures that may prevent or decrease complications (Diabetes Survival Skills Education) will improve 11/01/2022 0613 by Elmore Guise, RN Outcome: Progressing 11/01/2022 0613 by Elmore Guise, RN Outcome: Progressing Goal: Individualized Educational Video(s) 11/01/2022 0613 by Elmore Guise, RN Outcome: Progressing 11/01/2022 0613 by Elmore Guise, RN Outcome: Progressing   Problem: Coping: Goal: Ability to adjust to condition or change in health will improve 11/01/2022 0613 by Elmore Guise, RN Outcome: Progressing 11/01/2022 0613 by Elmore Guise, RN Outcome: Progressing   Problem: Fluid Volume: Goal: Ability to maintain a balanced intake and output will improve 11/01/2022 0613 by Elmore Guise, RN Outcome: Progressing 11/01/2022 0613 by Elmore Guise, RN Outcome: Progressing   Problem: Health Behavior/Discharge Planning: Goal: Ability to identify and utilize available resources and services will improve 11/01/2022 0613 by Elmore Guise, RN Outcome: Progressing 11/01/2022 0613 by Elmore Guise, RN Outcome: Progressing Goal: Ability to manage health-related needs will improve 11/01/2022 0613 by Elmore Guise, RN Outcome: Progressing 11/01/2022 0613 by Elmore Guise, RN Outcome: Progressing   Problem: Metabolic: Goal: Ability to maintain appropriate glucose levels will improve 11/01/2022 0613 by Elmore Guise, RN Outcome: Progressing 11/01/2022 0613 by Elmore Guise, RN Outcome: Progressing   Problem: Nutritional: Goal: Maintenance of adequate nutrition will improve 11/01/2022 0613 by Elmore Guise, RN Outcome: Progressing 11/01/2022 0613 by Elmore Guise, RN Outcome: Progressing Goal: Progress toward achieving an optimal weight will improve 11/01/2022 0613 by Elmore Guise, RN Outcome: Progressing 11/01/2022 0613 by Elmore Guise, RN Outcome: Progressing   Problem: Skin Integrity: Goal: Risk for impaired skin  integrity will decrease Outcome: Progressing   Problem: Tissue Perfusion: Goal: Adequacy of tissue perfusion will improve Outcome: Progressing   Problem: Education: Goal: Knowledge of General Education information will improve Description: Including pain rating scale, medication(s)/side effects and non-pharmacologic comfort measures Outcome: Progressing   Problem: Health Behavior/Discharge Planning: Goal: Ability to manage health-related needs will improve Outcome: Progressing   Problem: Clinical Measurements: Goal: Ability to maintain clinical measurements within normal limits will improve Outcome: Progressing Goal: Will remain free from infection Outcome: Progressing Goal: Diagnostic test results will improve Outcome: Progressing Goal: Respiratory complications will improve Outcome: Progressing Goal: Cardiovascular complication will be avoided Outcome: Progressing   Problem: Activity: Goal: Risk for activity intolerance will decrease Outcome: Progressing   Problem: Nutrition: Goal: Adequate nutrition will be maintained Outcome: Progressing   Problem: Coping: Goal: Level of anxiety will decrease Outcome: Progressing   Problem: Elimination: Goal: Will not experience complications related to bowel motility Outcome: Progressing Goal: Will not experience complications related to urinary retention Outcome: Progressing   Problem: Pain Managment: Goal: General experience of comfort will improve Outcome: Progressing   Problem: Safety: Goal: Ability to remain free from injury will improve Outcome: Progressing   Problem: Skin Integrity: Goal: Risk for impaired skin integrity will decrease Outcome: Progressing

## 2022-11-01 NOTE — Evaluation (Signed)
Physical Therapy Evaluation Patient Details Name: TORRA BAUNE MRN: [id number] DOB: Apr 23, 1960 Today's Date: 11/01/2022  History of Present Illness  Pt is a 62 y.o. female presenting 10/29/2022 after having two seizures. Intubated for airway protection 9/8 and extubated 9/9. EEG suggestive of mild diffuse encephalopathy; no seizures or epileptiform discharges noted.  PMH significant for HLD, TIA, CVA, ACA aneurysm.  Clinical Impression  Patient presents with mobility close to functional baseline per her report.  She mobilized today in the room with RW and S and some around bed no device without LOB using furniture at times for stability.  Standing at sink for functional task with no UE support with S only.  And demonstrating good safety awareness initiating up to EOB prior to PT entrance then back to bed without cues till PT entered.  She was living with sons previously in second floor condo and will benefit from skilled PT in the acute setting to ensure safety on stairs prior to d/c.  Anticipate she will not need follow up PT at d/c.         If plan is discharge home, recommend the following: Direct supervision/assist for financial management;Help with stairs or ramp for entrance;Assist for transportation;Assistance with cooking/housework;Direct supervision/assist for medications management   Can travel by private vehicle        Equipment Recommendations None recommended by PT  Recommendations for Other Services       Functional Status Assessment Patient has had a recent decline in their functional status and demonstrates the ability to make significant improvements in function in a reasonable and predictable amount of time.     Precautions / Restrictions Precautions Precautions: Fall      Mobility  Bed Mobility Overal bed mobility: Modified Independent                  Transfers   Equipment used: Rolling walker (2 wheels) Transfers: Sit to/from Stand Sit to Stand:  Supervision           General transfer comment: for safety    Ambulation/Gait Ambulation/Gait assistance: Supervision Gait Distance (Feet): 40 Feet Assistive device: Rolling walker (2 wheels), None Gait Pattern/deviations: Step-through pattern, Decreased stride length       General Gait Details: walking in the room for mobility due to soiled with urine in bed and assist to sink to wash up and change gown then around bed to recliner with and without RW; though holding foot of bed when without RW  Stairs            Wheelchair Mobility     Tilt Bed    Modified Rankin (Stroke Patients Only)       Balance Overall balance assessment: Needs assistance   Sitting balance-Leahy Scale: Good       Standing balance-Leahy Scale: Good Standing balance comment: standing to wash perineal area at sink no UE support and no physical assistance                             Pertinent Vitals/Pain Pain Assessment Pain Assessment: No/denies pain    Home Living Family/patient expects to be discharged to:: Private residence Living Arrangements: Children Available Help at Discharge: Family Type of Home: Apartment (condo) Home Access: Stairs to enter Entrance Stairs-Rails: Left Entrance Stairs-Number of Steps: 15   Home Layout: One level Home Equipment: Agricultural consultant (2 wheels);Shower seat Additional Comments: pt reports two sons live with her in which one  works during day and other works at night    Prior Function Prior Level of Function : Independent/Modified Independent;Driving             Mobility Comments: Pt reports intermittent use of RW when she feels like she needs it ADLs Comments: Pt reports independent in ADL, IADL, driving, and medication management.     Extremity/Trunk Assessment   Upper Extremity Assessment Upper Extremity Assessment: Defer to OT evaluation    Lower Extremity Assessment Lower Extremity Assessment: Generalized weakness     Cervical / Trunk Assessment Cervical / Trunk Assessment: Normal  Communication      Cognition Arousal: Alert Behavior During Therapy: WFL for tasks assessed/performed Overall Cognitive Status: Within Functional Limits for tasks assessed                                          General Comments General comments (skin integrity, edema, etc.): VSS    Exercises     Assessment/Plan    PT Assessment Patient needs continued PT services  PT Problem List Decreased strength;Decreased mobility;Decreased safety awareness       PT Treatment Interventions DME instruction;Functional mobility training;Gait training;Stair training;Therapeutic activities;Balance training    PT Goals (Current goals can be found in the Care Plan section)  Acute Rehab PT Goals Patient Stated Goal: return home PT Goal Formulation: With patient Time For Goal Achievement: 11/08/22 Potential to Achieve Goals: Good    Frequency Min 1X/week     Co-evaluation               AM-PAC PT "6 Clicks" Mobility  Outcome Measure Help needed turning from your back to your side while in a flat bed without using bedrails?: None Help needed moving from lying on your back to sitting on the side of a flat bed without using bedrails?: None Help needed moving to and from a bed to a chair (including a wheelchair)?: None Help needed standing up from a chair using your arms (e.g., wheelchair or bedside chair)?: None Help needed to walk in hospital room?: A Little Help needed climbing 3-5 steps with a railing? : Total 6 Click Score: 20    End of Session Equipment Utilized During Treatment: Gait belt Activity Tolerance: Patient tolerated treatment well Patient left: in chair;with call bell/phone within reach;with chair alarm set   PT Visit Diagnosis: Muscle weakness (generalized) (M62.81)    Time: 1510-1536 PT Time Calculation (min) (ACUTE ONLY): 26 min   Charges:   PT Evaluation $PT Eval Low  Complexity: 1 Low PT Treatments $Gait Training: 8-22 mins PT General Charges $$ ACUTE PT VISIT: 1 Visit         Sheran Lawless, PT Acute Rehabilitation Services Office:[phone] 11/01/2022   Elray Mcgregor 11/01/2022, 6:04 PM

## 2022-11-02 DIAGNOSIS — G40901 Epilepsy, unspecified, not intractable, with status epilepticus: Secondary | ICD-10-CM | POA: Diagnosis not present

## 2022-11-02 LAB — BASIC METABOLIC PANEL
Anion gap: 9 (ref 5–15)
BUN: 9 mg/dL (ref 8–23)
CO2: 22 mmol/L (ref 22–32)
Calcium: 8.8 mg/dL — ABNORMAL LOW (ref 8.9–10.3)
Chloride: 108 mmol/L (ref 98–111)
Creatinine, Ser: 1.03 mg/dL — ABNORMAL HIGH (ref 0.44–1.00)
GFR, Estimated: >60 mL/min (ref >60)
Glucose, Bld: 119 mg/dL — ABNORMAL HIGH (ref 70–99)
Potassium: 4.1 mmol/L (ref 3.5–5.1)
Sodium: 139 mmol/L (ref 135–145)

## 2022-11-02 LAB — GLUCOSE, CAPILLARY
Glucose-Capillary: 105 mg/dL — ABNORMAL HIGH (ref 70–99)
Glucose-Capillary: 125 mg/dL — ABNORMAL HIGH (ref 70–99)
Glucose-Capillary: 94 mg/dL (ref 70–99)

## 2022-11-02 LAB — CBC
HCT: 35 % — ABNORMAL LOW (ref 36.0–46.0)
Hemoglobin: 10.8 g/dL — ABNORMAL LOW (ref 12.0–15.0)
MCH: 23.5 pg — ABNORMAL LOW (ref 26.0–34.0)
MCHC: 30.9 g/dL (ref 30.0–36.0)
MCV: 76.3 fL — ABNORMAL LOW (ref 80.0–100.0)
Platelets: 184 10*3/uL (ref 150–400)
RBC: 4.59 MIL/uL (ref 3.87–5.11)
RDW: 15.4 % (ref 11.5–15.5)
WBC: 6.1 10*3/uL (ref 4.0–10.5)
nRBC: 0 % (ref 0.0–0.2)

## 2022-11-02 LAB — MAGNESIUM: Magnesium: 2 mg/dL (ref 1.7–2.4)

## 2022-11-02 LAB — PHOSPHORUS: Phosphorus: 4.5 mg/dL (ref 2.5–4.6)

## 2022-11-02 MED ORDER — LEVETIRACETAM 1000 MG PO TABS: 1000.0000 mg | ORAL_TABLET | Freq: Two times a day (BID) | ORAL | 0 refills | Status: DC

## 2022-11-02 MED ORDER — INFLUENZA VIRUS VACC SPLIT PF (FLUZONE) 0.5 ML IM SUSY: 0.5000 mL | PREFILLED_SYRINGE | INTRAMUSCULAR | Status: DC

## 2022-11-02 MED ORDER — INFLUENZA VIRUS VACC SPLIT PF (FLUZONE) 0.5 ML IM SUSY
0.5000 mL | PREFILLED_SYRINGE | Freq: Once | INTRAMUSCULAR | Status: AC
  Administered 2022-11-02: 0.5 mL via INTRAMUSCULAR
  Filled 2022-11-02: qty 0.5

## 2022-11-02 NOTE — Progress Notes (Signed)
Physical Therapy Treatment Patient Details Name: AUTIANA BAULT MRN: [id number] DOB: 1960/10/10 Today's Date: 11/02/2022   History of Present Illness Pt is a 62 y.o. female presenting 10/29/2022 after having two seizures. Intubated for airway protection 9/8 and extubated 9/9. EEG suggestive of mild diffuse encephalopathy; no seizures or epileptiform discharges noted.  PMH significant for HLD, TIA, CVA, ACA aneurysm.    PT Comments  Pt greeted up in chair on arrival and agreeable to session with continued progress towards acute goals. Pt demonstrating increased activity tolerance with increased ambulation distance with RW support and supervision for safety. Pt able to ascend/descend steps in stairwell with cues for sequencing and safety. Pt was educated on continued walker use to maximize functional independence, safety, and decrease risk for falls as well as appropriate activity progression. Pt continues to benefit from skilled PT services to progress toward functional mobility goals.     If plan is discharge home, recommend the following: Direct supervision/assist for financial management;Help with stairs or ramp for entrance;Assist for transportation;Assistance with cooking/housework;Direct supervision/assist for medications management   Can travel by private vehicle        Equipment Recommendations  None recommended by PT    Recommendations for Other Services       Precautions / Restrictions Precautions Precautions: Fall Restrictions Weight Bearing Restrictions: No     Mobility  Bed Mobility Overal bed mobility: Modified Independent             General bed mobility comments: up in chair on arrival    Transfers Overall transfer level: Needs assistance Equipment used: None Transfers: Sit to/from Stand Sit to Stand: Supervision           General transfer comment: supervision for safety    Ambulation/Gait Ambulation/Gait assistance: Supervision Gait Distance  (Feet): 250 Feet Assistive device: Rolling walker (2 wheels), None Gait Pattern/deviations: Step-through pattern, Decreased stride length Gait velocity: decr     General Gait Details: walking in room from chiar around bed to RW with pt reaching for support of footboard of bed, hallway distance with RW support, no instances of bumping into objects on R, no LOB   Stairs Stairs: Yes Stairs assistance: Min assist, Contact guard assist Stair Management: One rail Right, Alternating pattern, Step to pattern, Forwards Number of Stairs: 6 (+ 3) General stair comments: pt with alternating step pattern iniitally, leading with LLE on descent with noted instability and slight R knee buckling, demonstrated and educated on up with L and down with R (stronger/weaker) and step to pattern with pt able to demonstrate back with noted increase in stabitlity, pt needed cues for each step for sequencing as pt with poor recall   Wheelchair Mobility     Tilt Bed    Modified Rankin (Stroke Patients Only)       Balance Overall balance assessment: Needs assistance Sitting-balance support: No upper extremity supported, Feet supported Sitting balance-Leahy Scale: Good     Standing balance support: Bilateral upper extremity supported, No upper extremity supported, During functional activity Standing balance-Leahy Scale: Good                              Cognition Arousal: Alert Behavior During Therapy: WFL for tasks assessed/performed Overall Cognitive Status: Within Functional Limits for tasks assessed  Exercises General Exercises - Lower Extremity Long Arc Quad: AROM, Strengthening, Right, 10 reps, Seated Hip Flexion/Marching: AROM, Right, 10 reps, Seated    General Comments General comments (skin integrity, edema, etc.): VSS, son present and supportive      Pertinent Vitals/Pain Pain Assessment Pain Assessment: No/denies  pain Pain Intervention(s): Monitored during session    Home Living                          Prior Function            PT Goals (current goals can now be found in the care plan section) Acute Rehab PT Goals Patient Stated Goal: return home PT Goal Formulation: With patient Time For Goal Achievement: 11/08/22 Progress towards PT goals: Progressing toward goals    Frequency    Min 1X/week      PT Plan      Co-evaluation              AM-PAC PT "6 Clicks" Mobility   Outcome Measure  Help needed turning from your back to your side while in a flat bed without using bedrails?: None Help needed moving from lying on your back to sitting on the side of a flat bed without using bedrails?: None Help needed moving to and from a bed to a chair (including a wheelchair)?: None Help needed standing up from a chair using your arms (e.g., wheelchair or bedside chair)?: None Help needed to walk in hospital room?: A Little Help needed climbing 3-5 steps with a railing? : A Little 6 Click Score: 22    End of Session Equipment Utilized During Treatment: Gait belt Activity Tolerance: Patient tolerated treatment well Patient left: with call bell/phone within reach;in bed;with family/visitor present (seated EOB) Nurse Communication: Mobility status PT Visit Diagnosis: Muscle weakness (generalized) (M62.81)     Time: 8119-1478 PT Time Calculation (min) (ACUTE ONLY): 16 min  Charges:    $Gait Training: 8-22 mins PT General Charges $$ ACUTE PT VISIT: 1 Visit                     Tobi Bastos R. PTA Acute Rehabilitation Services Office: [phone]   Catalina Antigua 11/02/2022, 9:48 AM

## 2022-11-02 NOTE — Discharge Summary (Signed)
Physician Discharge Summary  Betty Archer:[id number] DOB: [birth year] DOA: 10/29/2022  PCP: Verlee Rossetti, PA-C  Admit date: 10/29/2022 Discharge date: 11/02/2022  Admitted From: Home Disposition: Home  Recommendations for Outpatient Follow-up:  Follow up with PCP in 1-2 weeks Outpatient follow-up with neurology for recurrent seizure Keppra increased to 1000 mg p.o. twice daily No driving until seizure-free for 6 months  Home Health: PT/OT Equipment/Devices: None  Discharge Condition: Stable CODE STATUS: Full code Diet recommendation: Heart healthy diet  History of present illness:  Betty Archer is a 62 y.o. female with past medical history significant for seizure disorder, history of left MCA aneurysm s/p embolization 2022, history of CVA who presented to [facility] ED on 9/8 from home via EMS after being found down at home.  She had multiple witnessed seizures by EMS, received 7.5 mg of Ativan and transported to the ED for further evaluation.   On arrival to the ED, patient was not protecting her airway so she was intubated and placed on ventilatory support.  Temperature 98.2 F, HR 145, RR 16, BP 150/93, SpO2 100% on mechanical ventilation.  ABG with pH 7.326, pCO2 39.6, pO2 407.  WBC 11.8, hemoglobin low 0.0, platelet count 189.  Sodium 140, potassium 3.9, chloride 109, CO2 21, glucose 103, BUN 10, creat 1.07. Lactic acid 14.0.  Acetaminophen level less than 10, salicylate level less than 7.0.  TSH 0.544, free T40.97.  COVID PCR negative.  UDS positive for benzos.  CT head without contrast with no acute intracranial Gershon Mussel, encephalomalacia with both anterior cerebral artery territories, right greater than left, coil pack in the region of the anterior communicating artery, aneurysm web device at the left MCA bifurcation.  Chest x-ray with low lung volume with minimal basilar atelectasis, left greater than right, possible tiny left pleural effusion.  Pulmonary critical care medicine  consulted for admission with concern for status epilepticus.   Significant Hospital events: 9/8: Admit to PCCM, on ventilatory support with concern for status epilepticus, neurology consulted, EEG with no seizure or epileptiform discharges, severe diffuse encephalopathy; Keppra increased to 1000 mg twice daily 9/9: Extubated 9/11: Transferred to hospitalist service, pending PT/OT evaluation 9/12: Discharging home with home health  Hospital course:  Status epilepticus resulting in airway compromise requiring ventilatory support Patient presenting to the ED after being found down unresponsive by family members, on EMS arrival patient was noted to be having active seizures and was given Ativan and transported to the ED for further evaluation.  On arrival to the ED patient was not protecting her airway and she was subsequently intubated and placed on ventilatory support.  Patient was initially admitted to the pulmonary critical care medicine service with consultation with neurology.  Patient underwent EEG with findings of severe diffuse encephalopathy likely related to sedation, no seizures or epileptiform discharges noted.  Patient's home Keppra was increased to 1000 mg twice daily.  Patient was successfully extubated on 10/30/2022. Outpatient follow-up with neurology   Standard seizure precautions: Per [facility] statutes, patients with seizures are not allowed to drive until  they have been seizure-free for six months. Use caution when using heavy equipment or power tools. Avoid working on ladders or at heights. Take showers instead of baths. Ensure the water temperature is not too high on the home water heater. Do not go swimming alone. When caring for infants or small children, sit down when holding, feeding, or changing them to minimize risk of injury to the child in the event  you have a seizure.  To reduce risk of seizures, maintain good sleep hygiene avoid alcohol and illicit drug use, take  all anti-seizure medications as prescribed.    Hypokalemia Repleted.   Depression/anxiety Bupropion 300 mg p.o. daily   HLD Atorvastatin 80 mg p.o. nightly  Obesity Body mass index is 37.33 kg/m.  Complicates all facets of care  Discharge Diagnoses:  Principal Problem:   Status epilepticus [facility])    Discharge Instructions  Discharge Instructions     Call MD for:  difficulty breathing, headache or visual disturbances   Complete by: As directed    Call MD for:  extreme fatigue   Complete by: As directed    Call MD for:  persistant dizziness or light-headedness   Complete by: As directed    Call MD for:  persistant nausea and vomiting   Complete by: As directed    Call MD for:  severe uncontrolled pain   Complete by: As directed    Call MD for:  temperature >100.4   Complete by: As directed    Diet - low sodium heart healthy   Complete by: As directed    Increase activity slowly   Complete by: As directed       Allergies as of 11/02/2022   No Known Allergies      Medication List     STOP taking these medications    methylphenidate 10 MG tablet Commonly known as: RITALIN       TAKE these medications    acetaminophen 325 MG tablet Commonly known as: TYLENOL Take 2 tablets (650 mg total) by mouth every 6 (six) hours as needed for mild pain or headache.   Aspirin Low Dose 81 MG chewable tablet Generic drug: aspirin Chew 1 tablet (81 mg total) by mouth in the morning and at bedtime.   atorvastatin 80 MG tablet Commonly known as: LIPITOR Take 1 tablet (80 mg total) by mouth at bedtime.   Brilinta 90 MG Tabs tablet Generic drug: ticagrelor Take 1 tablet (90 mg total) by mouth 2 (two) times daily.   buPROPion 300 MG 24 hr tablet Commonly known as: WELLBUTRIN XL TAKE 1 TABLET(300 MG) BY MOUTH DAILY   hydrocortisone 2.5 % cream Apply 1 application. topically 2 (two) times daily as needed (itching).   levETIRAcetam 1000 MG tablet Commonly known  as: KEPPRA Take 1 tablet (1,000 mg total) by mouth 2 (two) times daily. What changed:  medication strength how much to take   lidocaine 5 % Commonly known as: LIDODERM Place 1 patch onto the skin daily. Apply at 8 am and remove at  pm daily. What changed:  when to take this reasons to take this   meloxicam 7.5 MG tablet Commonly known as: Mobic Take 1 tablet (7.5 mg total) by mouth daily.   Senexon-S 8.6-50 MG tablet Generic drug: senna-docusate Take 2 tablets by mouth at bedtime.        Follow-up Information     Verlee Rossetti, PA-C. Schedule an appointment as soon as possible for a visit in 1 week(s).   Specialty: Physician Assistant Contact information: [address] [place] [address] [phone]         [facility]. Schedule an appointment as soon as possible for a visit.   Specialty: Neurology Why: Follow-up regarding seizures Contact information: [address] [place] [address] [phone]  No Known Allergies  Consultations: Neurology   Procedures/Studies: Overnight EEG with video  Result Date: 10/31/2022 Charlsie Quest, MD     10/31/2022  9:42 AM Patient Name: Betty Archer MRN: [id number] Epilepsy Attending: Charlsie Quest Referring Physician/Provider: Lorin Glass, MD Duration: 10/30/2022 4098 to 10/31/2022 0931 Patient history: 62 y.o. female with a history of aneurysm and previous stroke who presents with seizure getting rapid EEG to evaluate for seizure Level of alertness: comatose-->awake, asleep AEDs during EEG study: LEV, propofol Technical aspects: This EEG study was done with scalp electrodes positioned according to the 10-20 International system of electrode placement. Electrical activity was reviewed with band pass filter of 1-70Hz , sensitivity of 7 uV/mm, display speed of 4mm/sec with a 60Hz  notched filter applied as appropriate. EEG data were  recorded continuously and digitally stored.  Video monitoring was available and reviewed as appropriate. Description:  EEG showed continuous generalized 3 to 6 Hz theta-delta slowing admixed with an excessive amount of 12-13 Hz beta activity distributed symmetrically and diffusely. Gradually as sedation was weaned, EEG showed posterior dominant rhythm of 9-10 Hz activity of moderate voltage (25-35 uV) seen predominantly in posterior head regions, symmetric and reactive to eye opening and eye closing. Sleep was characterized by vertex waves, sleep spindles (12 to 14 Hz), maximal frontocentral region. EEG also showed intermittent generalized 3 to 6 Hz theta-delta slowing admixed with an excessive amount of 12-13 Hz beta activity distributed symmetrically and diffusely.  Hyperventilation and photic stimulation were not performed.   ABNORMALITY - Continuous slow, generalized IMPRESSION: This study was initially suggestive of severe diffuse encephalopathy likely related to sedation. Gradually as sedation was weaned, EEG improved and was suggestive of mild diffuse encephalopathy. No seizures or epileptiform discharges were seen throughout the recording. Charlsie Quest   DG Abd 1 View  Result Date: 10/30/2022 CLINICAL DATA:  Feeding tube placement. EXAM: ABDOMEN - 1 VIEW COMPARISON:  Chest x-ray 10/29/2022 FINDINGS: Exam demonstrates evidence of patient's enteric tube with tip just right of midline in the epigastric region likely over the distal stomach. Bowel gas pattern is nonobstructive. Remainder of the exam is unchanged. IMPRESSION: Enteric tube with tip over the distal stomach. Electronically Signed   By: Elberta Fortis M.D.   On: 10/30/2022 15:37   DG Chest Port 1 View  Result Date: 10/30/2022 CLINICAL DATA:  Ventilator dependence. EXAM: PORTABLE CHEST 1 VIEW COMPARISON:  10/29/2022 FINDINGS: Low volume film. Endotracheal tube tip is 4.7 cm above the base of the carina. The NG tube passes into the stomach  although the distal tip position is not included on the film. No pulmonary edema or dense consolidative opacity in either lung. Minimal basilar atelectasis noted, left greater than right with potential trace left pleural effusion. Telemetry leads overlie the chest. IMPRESSION: Low volume film with minimal basilar atelectasis, left greater than right. Possible tiny left effusion. Electronically Signed   By: Kennith Center M.D.   On: 10/30/2022 05:42   CT Head Wo Contrast  Result Date: 10/30/2022 CLINICAL DATA:  Delirium EXAM: CT HEAD WITHOUT CONTRAST TECHNIQUE: Contiguous axial images were obtained from the base of the skull through the vertex without intravenous contrast. RADIATION DOSE REDUCTION: This exam was performed according to the departmental dose-optimization program which includes automated exposure control, adjustment of the mA and/or kV according to patient size and/or use of iterative reconstruction technique. COMPARISON:  None Available. FINDINGS: Brain: Encephalomalacia within both anterior cerebral artery territories, right greater than left. Brain parenchyma is  otherwise normal. Vascular: Coil pack in the region of the anterior communicating artery. Aneurysm web device at the left MCA bifurcation. Skull: The visualized skull base, calvarium and extracranial soft tissues are normal. Sinuses/Orbits: No fluid levels or advanced mucosal thickening of the visualized paranasal sinuses. No mastoid or middle ear effusion. The orbits are normal. IMPRESSION: 1. No acute intracranial abnormality. 2. Encephalomalacia within both anterior cerebral artery territories, right greater than left. 3. Coil pack in the region of the anterior communicating artery. Aneurysm web device at the left MCA bifurcation. Electronically Signed   By: Deatra Robinson M.D.   On: 10/30/2022 01:36   DG Chest Portable 1 View  Result Date: 10/29/2022 CLINICAL DATA:  Verified to placement EXAM: PORTABLE CHEST 1 VIEW COMPARISON:  Chest  x-ray 02/27/2018 FINDINGS: Endotracheal tube tip is 3.5 cm above the carina. Enteric tube extends below the diaphragm. The heart size and mediastinal contours are within normal limits. Both lungs are clear. The visualized skeletal structures are unremarkable. IMPRESSION: Endotracheal tube tip is 3.5 cm above the carina. Electronically Signed   By: Darliss Cheney M.D.   On: 10/29/2022 23:56   MR ANGIO HEAD WO CONTRAST  Result Date: 10/24/2022 CLINICAL DATA:  Brain aneurysm. EXAM: MRA HEAD WITHOUT CONTRAST TECHNIQUE: Angiographic images of the Circle of Willis were acquired using MRA technique without intravenous contrast. COMPARISON:  MR angiogram September 16, 2021; CT angiogram September 22, 2022. FINDINGS: Anterior circulation: Status post anterior communicating artery aneurysm embolization with susceptibility artifact from embolization coils in the anterior interhemispheric fissure and along the right A1 and proximal bilateral A2/ACA segments from stents. No evidence of residual or recurrent artery aneurysm. Normal flow related enhancement in the bilateral A2 segments, distal to the stent construct. Status post endovascular embolization of a left MCA bifurcation aneurysm with a web device with adequate occlusion. Minimal residual neck (1 mm) at the base of the device for preserved patency of daughter artery. Both daughter arteries have normal caliber and flow related enhancement. Unchanged appearance of 2-3 mm right ICA communicating segment aneurysm. Luminal irregularity at the horizontal cavernous segment of the left ICA is likely artifactual given normal appearance on recent CT angiogram. Posterior circulation: Intracranial segment of the bilateral vertebral arteries, the basilar artery and the bilateral posterior cerebral arteries have normal course and caliber without evidence of stenosis, aneurysm or vascular malformation. Anatomic variants: Hypoplastic left A1/ACA. Other: None. IMPRESSION: 1. Status post stent  assisted coiling of an anterior communicating artery aneurysm. No evidence of residual or recurrent aneurysm. 2. Status post endovascular embolization of a left MCA bifurcation aneurysm with a web device with adequate aneurysm occlusion. Minimal residual neck at the base of the device is stable. 3. Unchanged 2-3 mm right posterior communicating artery aneurysm. Electronically Signed   By: Baldemar Lenis M.D.   On: 10/24/2022 11:00     Subjective: Patient seen examined bedside, resting calmly.  Sitting in bedside chair.  No specific complaints this morning.  Discussed with patient increased Keppra dosage to 1000 mg p.o. twice daily and no driving until 6 months seizure-free.  No other questions or concerns at this time.  Denies headache, no dizziness, no chest pain, no palpitations, no shortness of breath, no abdominal pain, no fever/chills/night sweats, no nausea/vomiting/diarrhea, no focal weakness, no fatigue, no paresthesias.  No acute events overnight per nursing staff.  Discharge Exam: Vitals:   11/02/22 0619 11/02/22 0735  BP: 129/89 114/89  Pulse: 66 80  Resp: 17 16  Temp:  98.5 F (36.9 C) 98.2 F (36.8 C)  SpO2: 100% 97%   Vitals:   11/01/22 2133 11/02/22 0023 11/02/22 0619 11/02/22 0735  BP: 139/77 138/84 129/89 114/89  Pulse: 86 86 66 80  Resp: 18 18 17 16   Temp: 99.1 F (37.3 C) 98.6 F (37 C) 98.5 F (36.9 C) 98.2 F (36.8 C)  TempSrc: Oral Oral Oral Oral  SpO2: 100% 100% 100% 97%  Weight:      Height:        Physical Exam: GEN: NAD, alert and oriented x 3, obese HEENT: NCAT, PERRL, EOMI, sclera clear, MMM PULM: CTAB w/o wheezes/crackles, normal respiratory effort, on room air CV: RRR w/o M/G/R GI: abd soft, NTND, NABS, no R/G/M MSK: no peripheral edema, muscle strength globally intact 5/5 bilateral upper/lower extremities NEURO: CN II-XII intact, no focal deficits, sensation to light touch intact PSYCH: normal mood/affect Integumentary:  dry/intact, no rashes or wounds    The results of significant diagnostics from this hospitalization (including imaging, microbiology, ancillary and laboratory) are listed below for reference.     Microbiology: Recent Results (from the past 240 hour(s))  MRSA Next Gen by PCR, Nasal     Status: None   Collection Time: 10/30/22  1:54 AM  Result Value Ref Range Status   MRSA by PCR Next Gen NOT DETECTED NOT DETECTED Final    Comment: (NOTE) The GeneXpert MRSA Assay (FDA approved for NASAL specimens only), is one component of a comprehensive MRSA colonization surveillance program. It is not intended to diagnose MRSA infection nor to guide or monitor treatment for MRSA infections. Test performance is not FDA approved in patients less than 54 years old. Performed at [facility], [address], [place], [place] [address]   SARS Coronavirus 2 by RT PCR (hospital order, performed in [facility] hospital lab) *cepheid single result test*     Status: None   Collection Time: 10/30/22  2:06 AM  Result Value Ref Range Status   SARS Coronavirus 2 by RT PCR NEGATIVE NEGATIVE Final    Comment: Performed at [facility], [address], [place], [place] [address]     Labs: BNP (last 3 results) No results for input(s): "BNP" in the last 8760 hours. Basic Metabolic Panel: Recent Labs  Lab 10/30/22 0142 10/30/22 0511 10/30/22 0514 10/31/22 0619 11/01/22 0821 11/02/22 0434  NA 143 140  --  139 140 139  K 4.0 3.9  --  3.4* 3.9 4.1  CL  --  109  --  107 111 108  CO2  --  21*  --  21* 23 22  GLUCOSE  --  103*  --  89 108* 119*  BUN  --  10  --  6* 7* 9  CREATININE  --  1.07*  --  0.88 0.94 1.03*  CALCIUM  --  8.6*  --  8.5* 8.4* 8.8*  MG  --   --  2.1 2.0 2.1 2.0  PHOS  --   --  2.4* 3.6 3.7 4.5   Liver Function Tests: Recent Labs  Lab 10/30/22 0511  AST 25  ALT 24  ALKPHOS 81  BILITOT <0.1*  PROT 6.2*  ALBUMIN 3.1*   Recent Labs  Lab 10/30/22 0511  LIPASE 22    No results for input(s): "AMMONIA" in the last 168 hours. CBC: Recent Labs  Lab 10/30/22 0142 10/30/22 0511 10/31/22 0619 11/01/22 0821 11/02/22 0434  WBC  --  11.8* 6.4 5.6 6.1  NEUTROABS  --  8.2*  --   --   --   HGB 12.2 11.0* 10.8* 10.6* 10.8*  HCT 36.0 36.0 35.4* 34.0* 35.0*  MCV  --  76.3* 76.3* 77.8* 76.3*  PLT  --  189 173 164 184   Cardiac Enzymes: Recent Labs  Lab 10/30/22 0511  CKTOTAL 361*   BNP: Invalid input(s): "POCBNP" CBG: Recent Labs  Lab 11/01/22 1656 11/01/22 2136 11/02/22 0017 11/02/22 0621 11/02/22 0734  GLUCAP 107* 106* 125* 94 105*   D-Dimer No results for input(s): "DDIMER" in the last 72 hours. Hgb A1c No results for input(s): "HGBA1C" in the last 72 hours. Lipid Profile No results for input(s): "CHOL", "HDL", "LDLCALC", "TRIG", "CHOLHDL", "LDLDIRECT" in the last 72 hours. Thyroid function studies No results for input(s): "TSH", "T4TOTAL", "T3FREE", "THYROIDAB" in the last 72 hours.  Invalid input(s): "FREET3" Anemia work up No results for input(s): "VITAMINB12", "FOLATE", "FERRITIN", "TIBC", "IRON", "RETICCTPCT" in the last 72 hours. Urinalysis    Component Value Date/Time   COLORURINE STRAW (A) 09/22/2022 1815   APPEARANCEUR CLEAR 09/22/2022 1815   LABSPEC 1.008 09/22/2022 1815   PHURINE 6.0 09/22/2022 1815   GLUCOSEU NEGATIVE 09/22/2022 1815   HGBUR NEGATIVE 09/22/2022 1815   BILIRUBINUR NEGATIVE 09/22/2022 1815   KETONESUR NEGATIVE 09/22/2022 1815   PROTEINUR NEGATIVE 09/22/2022 1815   NITRITE NEGATIVE 09/22/2022 1815   LEUKOCYTESUR NEGATIVE 09/22/2022 1815   Sepsis Labs Recent Labs  Lab 10/30/22 0511 10/31/22 0619 11/01/22 0821 11/02/22 0434  WBC 11.8* 6.4 5.6 6.1   Microbiology Recent Results (from the past 240 hour(s))  MRSA Next Gen by PCR, Nasal     Status: None   Collection Time: 10/30/22  1:54 AM  Result Value Ref Range Status   MRSA by PCR Next Gen NOT DETECTED NOT DETECTED Final    Comment:  (NOTE) The GeneXpert MRSA Assay (FDA approved for NASAL specimens only), is one component of a comprehensive MRSA colonization surveillance program. It is not intended to diagnose MRSA infection nor to guide or monitor treatment for MRSA infections. Test performance is not FDA approved in patients less than 58 years old. Performed at [facility], [address], [place], [place] [address]   SARS Coronavirus 2 by RT PCR (hospital order, performed in [facility] hospital lab) *cepheid single result test*     Status: None   Collection Time: 10/30/22  2:06 AM  Result Value Ref Range Status   SARS Coronavirus 2 by RT PCR NEGATIVE NEGATIVE Final    Comment: Performed at [facility], [address], [place], [place] [address]     Time coordinating discharge: Over 30 minutes  SIGNED:   Alvira Philips [place], DO  [facility] 11/02/2022, 9:04 AM

## 2022-11-06 ENCOUNTER — Other Ambulatory Visit: Payer: Self-pay

## 2022-11-06 ENCOUNTER — Encounter: Payer: Self-pay | Admitting: *Deleted

## 2022-11-06 ENCOUNTER — Emergency Department: Payer: No Typology Code available for payment source

## 2022-11-06 NOTE — ED Triage Notes (Addendum)
Pt is brought in by [facility] from wound center for evaluation of ble wounds, central line removal. Pt recently moved to [place] to live with her sister.  Pt is alert and oriented.  Pt has significant bed sores on buttock also, not undressed in triage.  Note from wound care center states: "The primary reason for the ER visit is to have the central line removed, consultation for the right foot and ankle location for a below-knee amputation, and consultation for colostomy which I think is going to be necessary in order to allow for her wounds to heal appropriately"

## 2022-11-08 ENCOUNTER — Encounter: Payer: Medicaid Other | Attending: Physical Medicine & Rehabilitation | Admitting: Physical Medicine & Rehabilitation

## 2022-11-08 DIAGNOSIS — G40901 Epilepsy, unspecified, not intractable, with status epilepticus: Secondary | ICD-10-CM | POA: Insufficient documentation

## 2022-11-08 DIAGNOSIS — M180 Bilateral primary osteoarthritis of first carpometacarpal joints: Secondary | ICD-10-CM | POA: Insufficient documentation

## 2022-11-15 ENCOUNTER — Encounter: Payer: Self-pay | Admitting: Physical Medicine & Rehabilitation

## 2022-11-15 ENCOUNTER — Encounter: Payer: Medicaid Other | Admitting: Physical Medicine & Rehabilitation

## 2022-11-15 VITALS — BP 111/75 | HR 91 | Ht 66.0 in | Wt 216.0 lb

## 2022-11-15 DIAGNOSIS — M180 Bilateral primary osteoarthritis of first carpometacarpal joints: Secondary | ICD-10-CM | POA: Diagnosis not present

## 2022-11-15 DIAGNOSIS — G40901 Epilepsy, unspecified, not intractable, with status epilepticus: Secondary | ICD-10-CM

## 2022-11-15 NOTE — Progress Notes (Signed)
Subjective:    Patient ID: Betty Archer, female    DOB: [birth year], 62 y.o.   MRN: [id number]  HPI  Betty Archer is here in follow-up of her stroke.  Since we last met she had onset of seizures and was in the hospital during earlier this month.  She was discharged on 11/02/2022.  She was sent home on Keppra 1000 mg twice daily.  She states that since she has been home things have gone fairly well.  She is tolerating the medication without any major issues.  She is disappointed that she cannot drive but seems to be dealing with it.  She states that her mood is generally stable.  At our last visit in July I sent her for x-rays of her left wrist.  Xray findings from July 1. No acute finding. 2. 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 3. Congenital fusion of the lunate and triquetrum.  I started her on meloxicam 7.5 mg daily but I am not sure that she is taking it consistently or at all.  Her son is with her states that she has not been using it.  She does have a wrist splint that she is used previously which provide some relief.  She states that she has had prior surgery to the wrist although I did not see any evidence of that on the x-ray.  There is some congenital fusion of the lunate and triquetrum bones which could possibly be the area.    Pain Inventory Average Pain 7 Pain Right Now 6 My pain is intermittent, sharp, tingling, and aching  LOCATION OF PAIN  right lower leg, right foot, headache, right shoulder, neck pain  BOWEL Number of stools per week: 7 Oral laxative use  OTC pill   BLADDER Pads  Bladder incontinence Yes     Mobility use a cane ability to climb steps?  yes do you drive?  no Do you have any goals in this area?  yes  Function disabled: date disabled 2024 I need assistance with the following:  meal prep Do you have any goals in this area?  yes  Neuro/Psych bladder control  problems weakness numbness tremor tingling spasms depression  Prior Studies Any changes since last visit?  yes Seen at [facility] in September 2024.  Physicians involved in your care Any changes since last visit?  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    Employer: [employer]  Tobacco Use   Smoking status: Some Days    Current packs/day: 0.50    Average packs/day: 0.5 packs/day for 27.0 years (13.5 ttl pk-yrs)    Types: Cigarettes   Smokeless tobacco: Never   Tobacco comments:    11/02/20 one a day  Vaping Use   Vaping status: Never Used  Substance and Sexual Activity   Alcohol use: Yes    Alcohol/week: 0.0 standard  drinks of alcohol    Comment:  patient is a social drinker   Drug use: No   Sexual activity: Not on file  Other Topics Concern   Not on file  Social History Narrative    She has 3 son who are healthy ,    11/02/20 lives with son, Betty Archer    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   Social Determinants of Health   Financial Resource Strain: Not on file  Food Insecurity: Patient Unable To Answer (10/30/2022)   Hunger Vital Sign    Worried About Running Out of Food in the Last Year: Patient unable to answer    Ran Out of Food in the Last Year: Patient unable to answer  Transportation Needs: Patient Unable To Answer (10/30/2022)   PRAPARE - Transportation    Lack of Transportation (Medical): Patient unable to answer    Lack of Transportation (Non-Medical): Patient  unable to answer  Physical Activity: Not on file  Stress: Not on file  Social Connections: Not on file   Past Surgical History:  Procedure Laterality Date   BREAST BIOPSY      Core biopsy done on April 2003   COLONOSCOPY     IR 3D INDEPENDENT WKST  08/25/2020   IR 3D INDEPENDENT WKST  08/25/2020   IR 3D INDEPENDENT WKST  01/17/2021   IR ANGIO EXTRACRAN SEL COM CAROTID INNOMINATE UNI R MOD SED  01/17/2021   IR ANGIO INTRA EXTRACRAN SEL INTERNAL CAROTID BILAT MOD SED  08/25/2020   IR ANGIO INTRA EXTRACRAN SEL INTERNAL CAROTID UNI L MOD SED  01/17/2021   IR ANGIO VERTEBRAL SEL VERTEBRAL BILAT MOD SED  08/25/2020   IR CT HEAD LTD  08/25/2020   IR CT HEAD LTD  01/17/2021   IR INTRA CRAN STENT  08/25/2020   IR TRANSCATH/EMBOLIZ  08/25/2020   IR TRANSCATH/EMBOLIZ  01/17/2021   IR US GUIDE VASC ACCESS RIGHT  08/25/2020   IR US GUIDE VASC ACCESS RIGHT  01/17/2021   PARTIAL HYSTERECTOMY   10/22/1999    Still has cervix   RADIOLOGY WITH ANESTHESIA N/A 08/25/2020   Procedure: IR WITH ANESTHESIA EMBOLIZATION;  Surgeon: Baldemar Lenis, MD;  Location: [facility] OR;  Service: Radiology;  Laterality: N/A;   RADIOLOGY WITH ANESTHESIA N/A 01/17/2021   Procedure: RADIOLOGY WITH ANESTHESIA  EMBOLIZATION;  Surgeon: Baldemar Lenis, MD;  Location: [facility] OR;  Service: Radiology;  Laterality: N/A;   TRIGGER FINGER RELEASE Left 12/25/2013   Procedure: LEFT THUMB TRIGGER RELEASE ;  Surgeon: Betha Loa, MD;  Location: [facility];  Service: Orthopedics;  Laterality: Left;   Past Medical History:  Diagnosis Date   Brain aneurysm    Cerebral aneurysm    Cognitive communication deficit    Hemiparesis (HCC)    Hemiplegia (HCC)    Hyperlipidemia    Hyperplastic colon polyp    Internal hemorrhoids    Stroke (HCC)    TIA (transient ischemic attack)    There were no vitals taken for this visit.  Opioid Risk Score:   Fall Risk Score:  `1  Depression screen [facility] 2/9     09/06/2022    9:53  AM 11/09/2021   10:05 AM 07/06/2021   10:06 AM 03/23/2021   11:15 AM 12/29/2020   10:49 AM 12/01/2020    9:49 AM 11/02/2020   11:41 AM  Depression screen PH[facility] 2/9  Decreased Interest 0 0 1 1 0 0 0  Down, Depressed, Hopeless 0 0 1 1 0 0 0  PHQ - 2 Score 0 0 2 2 0 0 0  Altered sleeping      0   Tired, decreased energy      1   Change in appetite      0   Feeling bad or failure about yourself       0   Trouble concentrating      2   Moving slowly or fidgety/restless      2   Suicidal thoughts      0   PHQ-9 Score      5     Review of Systems  Genitourinary:        Incontinence   Neurological:  Positive for tremors, weakness and numbness.       Tingling, spasms  Psychiatric/Behavioral:         Depression  All other systems reviewed and are negative.      Objective:   Physical Exam General: No acute distress HEENT: NCAT, EOMI, oral membranes moist Cards: reg rate  Chest: normal effort Abdomen: Soft, NT, ND Skin: dry, intact Extremities: no edema Psych: Flat but generally appropriate   Neuro: fairly focused. Initiates more. Improved awareness.   LUE 4/5. LLE 4+/5.  Balance has improved.  She is walking without a device.   Musculoskeletal: left thumb and wrist are tender with palpation and range of motion. Finkelstein's test +. No frank swelling at joint. No wamrth.             Assessment & Plan:  Medical Problem List and Plan: 1.  Altered mental status with decreased functional mobility/left lower extremity weakness secondary to large right ACA and small left ACA infarct likely related to recent ACOM aneurysm post coiling and stenting             -no driving d/t seizure             -CONTINUE with HEP  2. Stroke prophylaxis             -antiplatelet therapy: Brilinta stopped. aspirin 81 mg daily 3. Pain Management: Improved 4.  Tobacco use.  Tobacco cessation discussed. 5. Attention: This has remained generally reasonable off of Ritalin 6.  Reactive depression:    Wellbutrin XR  300 mg daily has been effective.             -Continue to monitor 7. Right hand pain.              -X-rays demonstrate carpometacarpal as well as carpal arthritis             -Recommended that she resume meloxicam 7.5 mg daily             -ice             -wrist splint during day. 8. LE edema: -needs to make dietary changes -weight loss -Seems improved today   20+ minutes of face to face patient care time were spent during this visit. All questions were encouraged and answered. Follow up with me in 6 months

## 2022-11-15 NOTE — Progress Notes (Deleted)
Subjective:    Patient ID: Betty Archer, female    DOB: [birth year], 62 y.o.   MRN: [id number]  HPI   .CPR Review of Systems     Objective:   Physical Exam        Assessment & Plan:

## 2022-11-15 NOTE — Patient Instructions (Addendum)
ALWAYS FEEL FREE TO CALL OUR OFFICE WITH ANY PROBLEMS OR QUESTIONS [phone])  **PLEASE NOTE** ALL MEDICATION REFILL REQUESTS (INCLUDING CONTROLLED SUBSTANCES) NEED TO BE MADE AT LEAST 7 DAYS PRIOR TO REFILL BEING DUE. ANY REFILL REQUESTS INSIDE THAT TIME FRAME MAY RESULT IN DELAYS IN RECEIVING YOUR PRESCRIPTION.                   TAKE MELOXICAM FOR YOUR LEFT WRIST/HAND PAIN.   USE SPLINT DURING THE DAY.

## 2022-12-29 ENCOUNTER — Other Ambulatory Visit: Payer: Self-pay

## 2023-01-23 ENCOUNTER — Other Ambulatory Visit: Payer: Self-pay

## 2023-01-23 ENCOUNTER — Telehealth: Payer: Self-pay

## 2023-01-23 MED ORDER — LEVETIRACETAM 1000 MG PO TABS
1000.0000 mg | ORAL_TABLET | Freq: Two times a day (BID) | ORAL | 1 refills | Status: DC
  Filled 2023-01-23: qty 180, 90d supply, fill #0
  Filled 2023-04-25: qty 180, 90d supply, fill #1

## 2023-01-23 NOTE — Telephone Encounter (Signed)
Rx written and sent to the pharmacy. Thanks!

## 2023-01-23 NOTE — Telephone Encounter (Signed)
Patient requesting refill on Keppra.

## 2023-01-24 ENCOUNTER — Other Ambulatory Visit: Payer: Self-pay

## 2023-02-05 ENCOUNTER — Other Ambulatory Visit: Payer: Self-pay | Admitting: Family Medicine

## 2023-02-05 DIAGNOSIS — Z1231 Encounter for screening mammogram for malignant neoplasm of breast: Secondary | ICD-10-CM

## 2023-02-22 ENCOUNTER — Ambulatory Visit
Admission: RE | Admit: 2023-02-22 | Discharge: 2023-02-22 | Disposition: A | Discharge status: Home / Self Care | Payer: 59 | Source: Ambulatory Visit | Attending: Family Medicine | Admitting: Family Medicine

## 2023-02-22 DIAGNOSIS — Z1231 Encounter for screening mammogram for malignant neoplasm of breast: Secondary | ICD-10-CM | POA: Diagnosis not present

## 2023-02-28 ENCOUNTER — Other Ambulatory Visit: Payer: Self-pay | Admitting: Family Medicine

## 2023-02-28 DIAGNOSIS — R928 Other abnormal and inconclusive findings on diagnostic imaging of breast: Secondary | ICD-10-CM

## 2023-03-21 ENCOUNTER — Other Ambulatory Visit: Payer: Self-pay

## 2023-03-27 ENCOUNTER — Other Ambulatory Visit: Payer: Self-pay | Admitting: Family Medicine

## 2023-03-27 ENCOUNTER — Ambulatory Visit
Admission: RE | Admit: 2023-03-27 | Discharge: 2023-03-27 | Disposition: A | Discharge status: Home / Self Care | Payer: 59 | Source: Ambulatory Visit | Attending: Family Medicine | Admitting: Family Medicine

## 2023-03-27 ENCOUNTER — Ambulatory Visit: Payer: Medicaid Other

## 2023-03-27 DIAGNOSIS — R921 Mammographic calcification found on diagnostic imaging of breast: Secondary | ICD-10-CM

## 2023-03-27 DIAGNOSIS — R928 Other abnormal and inconclusive findings on diagnostic imaging of breast: Secondary | ICD-10-CM

## 2023-04-04 ENCOUNTER — Ambulatory Visit
Admission: RE | Admit: 2023-04-04 | Discharge: 2023-04-04 | Disposition: A | Discharge status: Home / Self Care | Payer: 59 | Source: Ambulatory Visit | Attending: Family Medicine | Admitting: Family Medicine

## 2023-04-04 DIAGNOSIS — N6489 Other specified disorders of breast: Secondary | ICD-10-CM | POA: Diagnosis not present

## 2023-04-04 DIAGNOSIS — R921 Mammographic calcification found on diagnostic imaging of breast: Secondary | ICD-10-CM

## 2023-04-04 DIAGNOSIS — N6321 Unspecified lump in the left breast, upper outer quadrant: Secondary | ICD-10-CM | POA: Diagnosis not present

## 2023-04-04 DIAGNOSIS — N6312 Unspecified lump in the right breast, upper inner quadrant: Secondary | ICD-10-CM | POA: Diagnosis not present

## 2023-04-04 HISTORY — PX: BREAST BIOPSY: SHX20

## 2023-04-05 LAB — SURGICAL PATHOLOGY

## 2023-04-06 ENCOUNTER — Other Ambulatory Visit: Payer: Self-pay | Admitting: Family Medicine

## 2023-04-06 DIAGNOSIS — R921 Mammographic calcification found on diagnostic imaging of breast: Secondary | ICD-10-CM

## 2023-04-10 ENCOUNTER — Encounter: Payer: Self-pay | Admitting: Family Medicine

## 2023-04-25 ENCOUNTER — Other Ambulatory Visit: Payer: Self-pay

## 2023-05-07 DIAGNOSIS — M79642 Pain in left hand: Secondary | ICD-10-CM | POA: Diagnosis not present

## 2023-05-07 DIAGNOSIS — D229 Melanocytic nevi, unspecified: Secondary | ICD-10-CM | POA: Diagnosis not present

## 2023-05-07 DIAGNOSIS — R0602 Shortness of breath: Secondary | ICD-10-CM | POA: Diagnosis not present

## 2023-05-07 DIAGNOSIS — M79641 Pain in right hand: Secondary | ICD-10-CM | POA: Diagnosis not present

## 2023-05-07 DIAGNOSIS — R413 Other amnesia: Secondary | ICD-10-CM | POA: Diagnosis not present

## 2023-05-07 DIAGNOSIS — Z742 Need for assistance at home and no other household member able to render care: Secondary | ICD-10-CM | POA: Diagnosis not present

## 2023-05-07 DIAGNOSIS — M545 Low back pain, unspecified: Secondary | ICD-10-CM | POA: Diagnosis not present

## 2023-05-07 DIAGNOSIS — Z23 Encounter for immunization: Secondary | ICD-10-CM | POA: Diagnosis not present

## 2023-05-07 DIAGNOSIS — F17210 Nicotine dependence, cigarettes, uncomplicated: Secondary | ICD-10-CM | POA: Diagnosis not present

## 2023-05-11 DIAGNOSIS — M180 Bilateral primary osteoarthritis of first carpometacarpal joints: Secondary | ICD-10-CM | POA: Diagnosis not present

## 2023-05-16 ENCOUNTER — Encounter: Payer: Medicaid Other | Admitting: Physical Medicine & Rehabilitation

## 2023-06-20 ENCOUNTER — Encounter: Payer: Self-pay | Admitting: Physical Medicine & Rehabilitation

## 2023-06-20 ENCOUNTER — Encounter: Attending: Physical Medicine & Rehabilitation | Admitting: Physical Medicine & Rehabilitation

## 2023-06-20 DIAGNOSIS — E785 Hyperlipidemia, unspecified: Secondary | ICD-10-CM | POA: Diagnosis not present

## 2023-06-20 MED ORDER — ATORVASTATIN CALCIUM 80 MG PO TABS: 80.0000 mg | ORAL_TABLET | Freq: Every day | ORAL | 3 refills | Status: DC
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
## 2023-06-20 NOTE — Patient Instructions (Addendum)
 ALWAYS FEEL FREE TO CALL OUR OFFICE WITH ANY PROBLEMS OR QUESTIONS [phone])  **PLEASE NOTE** ALL MEDICATION REFILL REQUESTS (INCLUDING CONTROLLED SUBSTANCES) NEED TO BE MADE AT LEAST 7 DAYS PRIOR TO REFILL BEING DUE. ANY REFILL REQUESTS INSIDE THAT TIME FRAME MAY RESULT IN DELAYS IN RECEIVING YOUR PRESCRIPTION.    WRIST AND THUMB PAIN  DICLOFENAC GEL 3 X DAILY MAY ALSO USE ORAL MELOXICAM  DAILY ICE SPICA THUMB SPLINT (AMAZON)                    STILL NO DRIVING   INCREASE YOUR WALKING. WORK ON SHIFTING YOUR WEIGHT EQUALLY TO BOTH LEGS WHEN YOU WALK. USE A TIMER TO HELP FIGURE OUT HOW LONG YOU'RE GOING AND THEN TRY TO INCREASE SLOWLY.

## 2023-07-27 ENCOUNTER — Other Ambulatory Visit: Payer: Self-pay

## 2023-07-27 DIAGNOSIS — E785 Hyperlipidemia, unspecified: Secondary | ICD-10-CM

## 2023-07-27 NOTE — Telephone Encounter (Signed)
 Chico Lemar called on behalf of his mother Betty Archer for a medication refill of her Lipitor  80mg . Would like a call when prescription is ready for pick up [phone].

## 2023-07-30 MED ORDER — ATORVASTATIN CALCIUM 80 MG PO TABS: 80.0000 mg | ORAL_TABLET | Freq: Every day | ORAL | 3 refills | Status: AC

## 2023-07-30 NOTE — Telephone Encounter (Signed)
 In Dr. Rachel Budds absence can you refill patients Atorvastatin . Son states that Dr. Rachel Budds always fills the medication

## 2023-07-31 ENCOUNTER — Other Ambulatory Visit: Payer: Self-pay

## 2023-07-31 ENCOUNTER — Other Ambulatory Visit: Payer: Self-pay | Admitting: Physical Medicine & Rehabilitation

## 2023-07-31 MED ORDER — LEVETIRACETAM 1000 MG PO TABS
1000.0000 mg | ORAL_TABLET | Freq: Two times a day (BID) | ORAL | 1 refills | Status: DC
  Filled 2023-07-31: qty 180, 90d supply, fill #0

## 2023-08-01 ENCOUNTER — Telehealth: Payer: Self-pay

## 2023-08-01 ENCOUNTER — Other Ambulatory Visit: Payer: Self-pay

## 2023-08-01 DIAGNOSIS — I63529 Cerebral infarction due to unspecified occlusion or stenosis of unspecified anterior cerebral artery: Secondary | ICD-10-CM

## 2023-08-01 DIAGNOSIS — M65319 Trigger thumb, unspecified thumb: Secondary | ICD-10-CM

## 2023-08-01 DIAGNOSIS — M180 Bilateral primary osteoarthritis of first carpometacarpal joints: Secondary | ICD-10-CM

## 2023-08-01 DIAGNOSIS — F329 Major depressive disorder, single episode, unspecified: Secondary | ICD-10-CM

## 2023-08-01 NOTE — Telephone Encounter (Signed)
 Betty Archer called in regards to his mother, Betty Archer being out of her medication. The pharmacy needs a resent.

## 2023-08-02 ENCOUNTER — Other Ambulatory Visit: Payer: Self-pay

## 2023-08-02 MED ORDER — BUPROPION HCL ER (XL) 300 MG PO TB24
300.0000 mg | ORAL_TABLET | Freq: Every day | ORAL | 5 refills | Status: AC
  Filled 2023-08-02: qty 30, 30d supply, fill #0

## 2023-08-02 MED ORDER — LEVETIRACETAM 1000 MG PO TABS
1000.0000 mg | ORAL_TABLET | Freq: Two times a day (BID) | ORAL | 3 refills | Status: AC
  Filled 2023-08-02 – 2023-11-02 (×2): qty 180, 90d supply, fill #0
  Filled 2024-01-25: qty 180, 90d supply, fill #1

## 2023-08-02 MED ORDER — MELOXICAM 7.5 MG PO TABS
7.5000 mg | ORAL_TABLET | Freq: Every day | ORAL | 5 refills | Status: AC
  Filled 2023-08-02: qty 30, 30d supply, fill #0

## 2023-08-02 NOTE — Telephone Encounter (Signed)
 I refilled wellbutrin , keppra , and meloxicam . Other meds were not prescribed by me.

## 2023-08-09 DIAGNOSIS — R609 Edema, unspecified: Secondary | ICD-10-CM | POA: Diagnosis not present

## 2023-08-09 DIAGNOSIS — D229 Melanocytic nevi, unspecified: Secondary | ICD-10-CM | POA: Diagnosis not present

## 2023-08-09 DIAGNOSIS — M545 Low back pain, unspecified: Secondary | ICD-10-CM | POA: Diagnosis not present

## 2023-08-09 DIAGNOSIS — M79642 Pain in left hand: Secondary | ICD-10-CM | POA: Diagnosis not present

## 2023-08-09 DIAGNOSIS — R0602 Shortness of breath: Secondary | ICD-10-CM | POA: Diagnosis not present

## 2023-08-09 DIAGNOSIS — H538 Other visual disturbances: Secondary | ICD-10-CM | POA: Diagnosis not present

## 2023-08-09 DIAGNOSIS — R413 Other amnesia: Secondary | ICD-10-CM | POA: Diagnosis not present

## 2023-08-09 DIAGNOSIS — M79641 Pain in right hand: Secondary | ICD-10-CM | POA: Diagnosis not present

## 2023-08-09 DIAGNOSIS — F17210 Nicotine dependence, cigarettes, uncomplicated: Secondary | ICD-10-CM | POA: Diagnosis not present

## 2023-08-13 ENCOUNTER — Other Ambulatory Visit: Payer: Self-pay

## 2023-09-04 ENCOUNTER — Ambulatory Visit: Admitting: Dermatology

## 2023-10-03 ENCOUNTER — Ambulatory Visit
Admission: RE | Admit: 2023-10-03 | Discharge: 2023-10-03 | Disposition: A | Discharge status: Home / Self Care | Payer: 59 | Source: Ambulatory Visit | Attending: Family Medicine | Admitting: Family Medicine

## 2023-10-03 DIAGNOSIS — R928 Other abnormal and inconclusive findings on diagnostic imaging of breast: Secondary | ICD-10-CM | POA: Diagnosis not present

## 2023-10-03 DIAGNOSIS — R921 Mammographic calcification found on diagnostic imaging of breast: Secondary | ICD-10-CM

## 2023-11-02 ENCOUNTER — Other Ambulatory Visit: Payer: Self-pay

## 2023-11-09 DIAGNOSIS — R0602 Shortness of breath: Secondary | ICD-10-CM | POA: Diagnosis not present

## 2023-11-09 DIAGNOSIS — M19042 Primary osteoarthritis, left hand: Secondary | ICD-10-CM | POA: Diagnosis not present

## 2023-11-09 DIAGNOSIS — D229 Melanocytic nevi, unspecified: Secondary | ICD-10-CM | POA: Diagnosis not present

## 2023-11-09 DIAGNOSIS — R609 Edema, unspecified: Secondary | ICD-10-CM | POA: Diagnosis not present

## 2023-11-09 DIAGNOSIS — Z23 Encounter for immunization: Secondary | ICD-10-CM | POA: Diagnosis not present

## 2023-11-09 DIAGNOSIS — F17210 Nicotine dependence, cigarettes, uncomplicated: Secondary | ICD-10-CM | POA: Diagnosis not present

## 2023-11-09 DIAGNOSIS — M19041 Primary osteoarthritis, right hand: Secondary | ICD-10-CM | POA: Diagnosis not present

## 2023-11-09 DIAGNOSIS — I739 Peripheral vascular disease, unspecified: Secondary | ICD-10-CM | POA: Diagnosis not present

## 2023-12-19 ENCOUNTER — Encounter: Payer: Self-pay | Admitting: Physical Medicine & Rehabilitation

## 2023-12-19 ENCOUNTER — Encounter: Attending: Physical Medicine & Rehabilitation | Admitting: Physical Medicine & Rehabilitation

## 2023-12-19 VITALS — BP 111/76 | HR 96 | Ht 66.0 in | Wt 229.6 lb

## 2023-12-19 DIAGNOSIS — Z8673 Personal history of transient ischemic attack (TIA), and cerebral infarction without residual deficits: Secondary | ICD-10-CM | POA: Insufficient documentation

## 2023-12-19 DIAGNOSIS — I69319 Unspecified symptoms and signs involving cognitive functions following cerebral infarction: Secondary | ICD-10-CM | POA: Insufficient documentation

## 2023-12-19 NOTE — Patient Instructions (Addendum)
 ALWAYS FEEL FREE TO CALL OUR OFFICE WITH ANY PROBLEMS OR QUESTIONS [phone])  **PLEASE NOTE** ALL MEDICATION REFILL REQUESTS (INCLUDING CONTROLLED SUBSTANCES) NEED TO BE MADE AT LEAST 7 DAYS PRIOR TO REFILL BEING DUE. ANY REFILL REQUESTS INSIDE THAT TIME FRAME MAY RESULT IN DELAYS IN RECEIVING YOUR PRESCRIPTION.    For back pain Tylenol  500mg  every 4-6 hours as needed Heating pad in the morning and at night for about 30 minutes a try Do the stretches I provided you daily 4.   Good posture when you stand and sit   If it doesn't improve over a few weeks, call me.     [facility] Address: [address] [place], [place] [address] Phone: [phone] Hours:  Open ? Closes 4?PM

## 2023-12-19 NOTE — Progress Notes (Signed)
 Subjective:    Patient ID: Betty Archer, female    DOB: 1960-12-23, 63 y.o.   MRN: [id number]  HPI  Betty Archer is here in follow up of her stroke. She complains of lower back pain. It hurts when she stands up more than 5-10 minutes. If she sits down it will feel better. If she sits for a long period of time her back will feel stiff and hurt initiatlly. She's not stretching. She takes meloxicam  which helps to a certain.   From a mood standpoint she's been pretty good. She's sleeping fair, typically she sleeps 6-7 hours per night.   Bowels and bladder functioning.   She still has some interest in driving but hasn't been cleared by neuro yet. Hasn't seen neuro in some time.    Pain Inventory Average Pain 8 Pain Right Now 8 My pain is sharp, burning, and aching  In the last 24 hours, has pain interfered with the following? General activity 4 Relation with others 0 Enjoyment of life 0 What TIME of day is your pain at its worst? 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    Employer: [employer]  Tobacco Use   Smoking status: Some Days    Current packs/day: 0.50    Average packs/day: 0.5 packs/day for 27.0 years (13.5 ttl pk-yrs)    Types: Cigarettes   Smokeless tobacco: Never   Tobacco comments:    11/02/20 one a day  Vaping Use   Vaping status: Never Used  Substance and Sexual Activity    Alcohol use: Yes    Alcohol/week: 0.0 standard drinks of alcohol    Comment:  patient is a social drinker   Drug use: No   Sexual activity: Not on file  Other Topics Concern   Not on file  Social History Narrative    She has 3 son who are healthy ,    11/02/20 lives with son, Constancia    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   Social Drivers of Corporate Investment Banker Strain: Not on file  Food Insecurity: Patient Unable To Answer (10/30/2022)   Hunger Vital Sign    Worried About Running Out of Food in the Last Year: Patient unable to answer    Ran Out of Food in the Last Year: Patient unable to answer  Transportation Needs: Patient Unable To Answer (10/30/2022)   PRAPARE - Administrator, Civil Service (Medical): Patient unable to answer    Lack of Transportation (Non-Medical): Patient unable to answer  Physical Activity: Not on file  Stress: Not on file  Social Connections: Not on file   Past Surgical History:  Procedure Laterality Date   BREAST BIOPSY      Core biopsy  done on April 2003   BREAST BIOPSY Right 04/04/2023   MM RT BREAST BX W LOC DEV 1ST LESION IMAGE BX SPEC STEREO GUIDE 04/04/2023 [facility]   BREAST BIOPSY Left 04/04/2023   MM LT BREAST BX W LOC DEV 1ST LESION IMAGE BX SPEC STEREO GUIDE 04/04/2023 [facility]   COLONOSCOPY     IR 3D INDEPENDENT WKST  08/25/2020   IR 3D INDEPENDENT WKST  08/25/2020   IR 3D INDEPENDENT WKST  01/17/2021   IR ANGIO EXTRACRAN SEL COM CAROTID INNOMINATE UNI R MOD SED  01/17/2021   IR ANGIO INTRA EXTRACRAN SEL INTERNAL CAROTID BILAT MOD SED  08/25/2020   IR ANGIO INTRA EXTRACRAN SEL INTERNAL CAROTID UNI L MOD SED  01/17/2021   IR ANGIO VERTEBRAL SEL VERTEBRAL BILAT MOD SED  08/25/2020   IR CT HEAD LTD  08/25/2020   IR CT HEAD LTD  01/17/2021   IR INTRA CRAN STENT  08/25/2020   IR TRANSCATH/EMBOLIZ  08/25/2020   IR TRANSCATH/EMBOLIZ  01/17/2021   IR US  GUIDE VASC ACCESS RIGHT  08/25/2020   IR US  GUIDE  VASC ACCESS RIGHT  01/17/2021   PARTIAL HYSTERECTOMY   10/22/1999    Still has cervix   RADIOLOGY WITH ANESTHESIA N/A 08/25/2020   Procedure: IR WITH ANESTHESIA EMBOLIZATION;  Surgeon: de Macedo Rodrigues, Katyucia, MD;  Location: [facility] OR;  Service: Radiology;  Laterality: N/A;   RADIOLOGY WITH ANESTHESIA N/A 01/17/2021   Procedure: RADIOLOGY WITH ANESTHESIA  EMBOLIZATION;  Surgeon: de Macedo Rodrigues, Katyucia, MD;  Location: [facility] OR;  Service: Radiology;  Laterality: N/A;   TRIGGER FINGER RELEASE Left 12/25/2013   Procedure: LEFT THUMB TRIGGER RELEASE ;  Surgeon: Franky Curia, MD;  Location: [facility];  Service: Orthopedics;  Laterality: Left;   Past Surgical History:  Procedure Laterality Date   BREAST BIOPSY      Core biopsy done on April 2003   BREAST BIOPSY Right 04/04/2023   MM RT BREAST BX W LOC DEV 1ST LESION IMAGE BX SPEC STEREO GUIDE 04/04/2023 [facility]   BREAST BIOPSY Left 04/04/2023   MM LT BREAST BX W LOC DEV 1ST LESION IMAGE BX SPEC STEREO GUIDE 04/04/2023 [facility]   COLONOSCOPY     IR 3D INDEPENDENT WKST  08/25/2020   IR 3D INDEPENDENT WKST  08/25/2020   IR 3D INDEPENDENT WKST  01/17/2021   IR ANGIO EXTRACRAN SEL COM CAROTID INNOMINATE UNI R MOD SED  01/17/2021   IR ANGIO INTRA EXTRACRAN SEL INTERNAL CAROTID BILAT MOD SED  08/25/2020   IR ANGIO INTRA EXTRACRAN SEL INTERNAL CAROTID UNI L MOD SED  01/17/2021   IR ANGIO VERTEBRAL SEL VERTEBRAL BILAT MOD SED  08/25/2020   IR CT HEAD LTD  08/25/2020   IR CT HEAD LTD  01/17/2021   IR INTRA CRAN STENT  08/25/2020   IR TRANSCATH/EMBOLIZ  08/25/2020   IR TRANSCATH/EMBOLIZ  01/17/2021   IR US  GUIDE VASC ACCESS RIGHT  08/25/2020   IR US  GUIDE VASC ACCESS RIGHT  01/17/2021   PARTIAL HYSTERECTOMY   10/22/1999    Still has cervix   RADIOLOGY WITH ANESTHESIA N/A 08/25/2020   Procedure: IR WITH ANESTHESIA EMBOLIZATION;  Surgeon: de Macedo Rodrigues, Katyucia, MD;  Location: [facility] OR;  Service: Radiology;  Laterality: N/A;    RADIOLOGY WITH ANESTHESIA N/A 01/17/2021   Procedure: RADIOLOGY WITH ANESTHESIA  EMBOLIZATION;  Surgeon: de Macedo Rodrigues, Katyucia, MD;  Location: [facility] OR;  Service: Radiology;  Laterality: N/A;   TRIGGER FINGER  RELEASE Left 12/25/2013   Procedure: LEFT THUMB TRIGGER RELEASE ;  Surgeon: Franky Curia, MD;  Location: [facility];  Service: Orthopedics;  Laterality: Left;   Past Medical History:  Diagnosis Date   Brain aneurysm    Cerebral aneurysm    Cognitive communication deficit    Hemiparesis (HCC)    Hemiplegia (HCC)    Hyperlipidemia    Hyperplastic colon polyp    Internal hemorrhoids    Stroke [facility])    TIA (transient ischemic attack)    BP 111/76   Pulse 96   Ht 5' 6 (1.676 m)   Wt 229 lb 9.6 oz (104.1 kg)   SpO2 97%   BMI 37.06 kg/m   Opioid Risk Score:   Fall Risk Score:  `1  Depression screen [facility] 2/9     06/20/2023    9:36 AM 09/06/2022    9:53 AM 11/09/2021   10:05 AM 07/06/2021   10:06 AM 03/23/2021   11:15 AM 12/29/2020   10:49 AM 12/01/2020    9:49 AM  Depression screen PHQ 2/9  Decreased Interest 0 0 0 1 1 0 0  Down, Depressed, Hopeless 0 0 0 1 1 0 0  PHQ - 2 Score 0 0 0 2 2 0 0  Altered sleeping       0  Tired, decreased energy       1  Change in appetite       0  Feeling bad or failure about yourself        0  Trouble concentrating       2  Moving slowly or fidgety/restless       2  Suicidal thoughts       0  PHQ-9 Score       5      Review of Systems  Musculoskeletal:  Positive for back pain.  All other systems reviewed and are negative.      Objective:   Physical Exam Constitutional: No distress . Vital signs reviewed. HEENT: NCAT, EOMI, oral membranes moist Neck: supple Cardiovascular: RRR without murmur. No JVD    Respiratory/Chest: CTA Bilaterally without wheezes or rales. Normal effort    GI/Abdomen: BS +, non-tender, non-distended Ext: no clubbing, cyanosis, or edema Psych: pleasant and cooperative , flat Neuro:  fairly focused. Initiates more. Still slow to process. Improved awareness.   LUE 4/5. LLE 4+/5.  Balance has improved.  She is walking without a device.  favors right side during gait Musculoskeletal:pain with extension, side bending to right. Facet maneuver equivocal. Neg SLR.            Assessment & Plan:  Medical Problem List and Plan: 1.  Altered mental status with decreased functional mobility/left lower extremity weakness secondary to large right ACA and small left ACA infarct likely related to recent ACOM aneurysm post coiling and stenting             -no driving d/t seizure at this point. Needs to be cleared by neurology. And then go thru driving rehab assessment.              -CONTINUE with HEP  2. Stroke prophylaxis             -antiplatelet therapy: Brilinta  stopped. aspirin  81 mg daily 3. LBP: lumbar facets?   -stretches were provided  -meloxicam   -tylenol   -heat  -f/u with me if no oiimprovement, consider PT, xrays 4.  Tobacco use.  Tobacco cessation discussed. 5. Attention:  This has remained generally reasonable off of Ritalin  6.  Reactive depression:   Wellbutrin  XR  300 mg daily has been effective.             -Continue to monitor 7. Bilateral thumb/ hand pain.              -better 8. Right leg--shifting weight better   20+ minutes of face to face patient care time were spent during this visit. All questions were encouraged and answered. Follow up with me in 6 months, sooner if needed

## 2024-01-25 ENCOUNTER — Other Ambulatory Visit: Payer: Self-pay

## 2024-03-14 ENCOUNTER — Ambulatory Visit: Attending: Nurse Practitioner | Admitting: Nurse Practitioner

## 2024-03-14 VITALS — BP 138/84 | HR 91 | Ht 66.0 in | Wt 223.0 lb

## 2024-03-14 DIAGNOSIS — Z720 Tobacco use: Secondary | ICD-10-CM | POA: Diagnosis not present

## 2024-03-14 DIAGNOSIS — E785 Hyperlipidemia, unspecified: Secondary | ICD-10-CM | POA: Diagnosis not present

## 2024-03-14 DIAGNOSIS — R9431 Abnormal electrocardiogram [ECG] [EKG]: Secondary | ICD-10-CM | POA: Diagnosis not present

## 2024-03-14 DIAGNOSIS — I422 Other hypertrophic cardiomyopathy: Secondary | ICD-10-CM

## 2024-03-14 DIAGNOSIS — I517 Cardiomegaly: Secondary | ICD-10-CM

## 2024-03-14 DIAGNOSIS — Z8673 Personal history of transient ischemic attack (TIA), and cerebral infarction without residual deficits: Secondary | ICD-10-CM | POA: Diagnosis not present

## 2024-03-14 DIAGNOSIS — Z8679 Personal history of other diseases of the circulatory system: Secondary | ICD-10-CM

## 2024-03-14 NOTE — Patient Instructions (Signed)
 Medication Instructions:  No changes to your medications *If you need a refill on your cardiac medications before your next appointment, please call your pharmacy*  Lab Work: No bloodwork was ordered today If you have labs (blood work) drawn today and your tests are completely normal, you will receive your results only by: MyChart Message (if you have MyChart) OR A paper copy in the mail If you have any lab test that is abnormal or we need to change your treatment, we will call you to review the results.  Testing/Procedures: Your physician has requested that you have an echocardiogram. Echocardiography is a painless test that uses sound waves to create images of your heart. It provides your doctor with information about the size and shape of your heart and how well your hearts chambers and valves are working. This procedure takes approximately one hour. There are no restrictions for this procedure. Please do NOT wear cologne, perfume, aftershave, or lotions (deodorant is allowed). Please arrive 15 minutes prior to your appointment time.  Please note: We ask at that you not bring children with you during ultrasound (echo/ vascular) testing. Due to room size and safety concerns, children are not allowed in the ultrasound rooms during exams. Our front office staff cannot provide observation of children in our lobby area while testing is being conducted. An adult accompanying a patient to their appointment will only be allowed in the ultrasound room at the discretion of the ultrasound technician under special circumstances. We apologize for any inconvenience.   Follow-Up: At [facility], you and your health needs are our priority.  As part of our continuing mission to provide you with exceptional heart care, our providers are all part of one team.  This team includes your primary Cardiologist (physician) and Advanced Practice Providers or APPs (Physician Assistants and Nurse Practitioners)  who all work together to provide you with the care you need, when you need it.  Your next appointment:   Will be made based upon test results  We recommend signing up for the patient portal called MyChart.  Sign up information is provided on this After Visit Summary.  MyChart is used to connect with patients for Virtual Visits (Telemedicine).  Patients are able to view lab/test results, encounter notes, upcoming appointments, etc.  Non-urgent messages can be sent to your provider as well.   To learn more about what you can do with MyChart, go to [email or web address].

## 2024-03-14 NOTE — Progress Notes (Signed)
 "  Office Visit    Patient Name: Betty Archer Date of Encounter: 03/14/2024  Primary Care Provider:  Loris Elsie PARAS, PA-C Primary Cardiologist:  None  Chief Complaint    64 year old female with a history of mild LVH noted on prior echocardiogram, diastolic dysfunction, CVA, left MCA aneurysm s/p embolization in 2022, seizures, hyperlipidemia, iron deficiency anemia, peripheral neuropathy, anxiety, depression, tobacco use, and obesity presents for heart first clinic new patient evaluation.  Past Medical History    Past Medical History:  Diagnosis Date   Brain aneurysm    Cerebral aneurysm    Cognitive communication deficit    Hemiparesis (HCC)    Hemiplegia (HCC)    Hyperlipidemia    Hyperplastic colon polyp    Internal hemorrhoids    Stroke [facility])    TIA (transient ischemic attack)    Past Surgical History:  Procedure Laterality Date   BREAST BIOPSY      Core biopsy done on April 2003   BREAST BIOPSY Right 04/04/2023   MM RT BREAST BX W LOC DEV 1ST LESION IMAGE BX SPEC STEREO GUIDE 04/04/2023 [facility]   BREAST BIOPSY Left 04/04/2023   MM LT BREAST BX W LOC DEV 1ST LESION IMAGE BX SPEC STEREO GUIDE 04/04/2023 [facility]   COLONOSCOPY     IR 3D INDEPENDENT WKST  08/25/2020   IR 3D INDEPENDENT WKST  08/25/2020   IR 3D INDEPENDENT WKST  01/17/2021   IR ANGIO EXTRACRAN SEL COM CAROTID INNOMINATE UNI R MOD SED  01/17/2021   IR ANGIO INTRA EXTRACRAN SEL INTERNAL CAROTID BILAT MOD SED  08/25/2020   IR ANGIO INTRA EXTRACRAN SEL INTERNAL CAROTID UNI L MOD SED  01/17/2021   IR ANGIO VERTEBRAL SEL VERTEBRAL BILAT MOD SED  08/25/2020   IR CT HEAD LTD  08/25/2020   IR CT HEAD LTD  01/17/2021   IR INTRA CRAN STENT  08/25/2020   IR TRANSCATH/EMBOLIZ  08/25/2020   IR TRANSCATH/EMBOLIZ  01/17/2021   IR US  GUIDE VASC ACCESS RIGHT  08/25/2020   IR US  GUIDE VASC ACCESS RIGHT  01/17/2021   PARTIAL HYSTERECTOMY   10/22/1999    Still has cervix   RADIOLOGY WITH ANESTHESIA N/A 08/25/2020    Procedure: IR WITH ANESTHESIA EMBOLIZATION;  Surgeon: de Macedo Rodrigues, Katyucia, MD;  Location: [facility] OR;  Service: Radiology;  Laterality: N/A;   RADIOLOGY WITH ANESTHESIA N/A 01/17/2021   Procedure: RADIOLOGY WITH ANESTHESIA  EMBOLIZATION;  Surgeon: de Macedo Rodrigues, Katyucia, MD;  Location: [facility] OR;  Service: Radiology;  Laterality: N/A;   TRIGGER FINGER RELEASE Left 12/25/2013   Procedure: LEFT THUMB TRIGGER RELEASE ;  Surgeon: Franky Curia, MD;  Location: [facility];  Service: Orthopedics;  Laterality: Left;    Allergies  Allergies[1]   Labs/Other Studies Reviewed    The following studies were reviewed today:  Cardiac Studies & Procedures   ______________________________________________________________________________________________     ECHOCARDIOGRAM  ECHOCARDIOGRAM COMPLETE 09/02/2020  Narrative ECHOCARDIOGRAM REPORT    Patient Name:   Betty Archer Date of Exam: 09/02/2020 Medical Rec #:  [id number]      Height:       66.0 in Accession #:    [id number]     Weight:       198.0 lb Date of Birth:  January 07, 1961      BSA:          1.991 m Patient Age:    59 years       BP:  130/66 mmHg Patient Gender: F              HR:           64 bpm. Exam Location:  Inpatient  Procedure: 2D Echo, Color Doppler and Cardiac Doppler  Indications:    Stroke I63.9  History:        Patient has prior history of Echocardiogram examinations, most recent 08/13/2013. Risk Factors:Dyslipidemia and Diabetes.  Sonographer:    Lauraine Pilot RDCS Referring Phys: [id number] OLADAPO ADEFESO  IMPRESSIONS   1. Left ventricular ejection fraction, by estimation, is 65 to 70%. The left ventricle has normal function. The left ventricle has no regional wall motion abnormalities. Left ventricular diastolic parameters are consistent with Grade II diastolic dysfunction (pseudonormalization). 2. Right ventricular systolic function is normal. The right ventricular size is normal.  Tricuspid regurgitation signal is inadequate for assessing PA pressure. 3. The mitral valve is normal in structure. No evidence of mitral valve regurgitation. No evidence of mitral stenosis. 4. The aortic valve is tricuspid. Aortic valve regurgitation is not visualized. No aortic stenosis is present. 5. The inferior vena cava is normal in size with greater than 50% respiratory variability, suggesting right atrial pressure of 3 mmHg.  FINDINGS Left Ventricle: Left ventricular ejection fraction, by estimation, is 65 to 70%. The left ventricle has normal function. The left ventricle has no regional wall motion abnormalities. The left ventricular internal cavity size was normal in size. There is no left ventricular hypertrophy. Left ventricular diastolic parameters are consistent with Grade II diastolic dysfunction (pseudonormalization).  Right Ventricle: The right ventricular size is normal. No increase in right ventricular wall thickness. Right ventricular systolic function is normal. Tricuspid regurgitation signal is inadequate for assessing PA pressure.  Left Atrium: Left atrial size was normal in size.  Right Atrium: Right atrial size was normal in size.  Pericardium: There is no evidence of pericardial effusion.  Mitral Valve: The mitral valve is normal in structure. No evidence of mitral valve regurgitation. No evidence of mitral valve stenosis.  Tricuspid Valve: The tricuspid valve is normal in structure. Tricuspid valve regurgitation is not demonstrated.  Aortic Valve: The aortic valve is tricuspid. Aortic valve regurgitation is not visualized. No aortic stenosis is present.  Pulmonic Valve: The pulmonic valve was normal in structure. Pulmonic valve regurgitation is not visualized.  Aorta: The aortic root is normal in size and structure.  Venous: The inferior vena cava is normal in size with greater than 50% respiratory variability, suggesting right atrial pressure of 3  mmHg.  IAS/Shunts: No atrial level shunt detected by color flow Doppler.   LEFT VENTRICLE PLAX 2D LVIDd:         3.60 cm  Diastology LVIDs:         2.00 cm  LV e' medial:    6.24 cm/s LV PW:         1.00 cm  LV E/e' medial:  14.6 LV IVS:        1.00 cm  LV e' lateral:   7.50 cm/s LVOT diam:     2.00 cm  LV E/e' lateral: 12.1 LV SV:         84 LV SV Index:   42 LVOT Area:     3.14 cm   RIGHT VENTRICLE RV S prime:     11.60 cm/s TAPSE (M-mode): 2.1 cm  LEFT ATRIUM             Index       RIGHT  ATRIUM           Index LA diam:        2.20 cm 1.10 cm/m  RA Area:     16.40 cm LA Vol (A2C):   30.4 ml 15.27 ml/m RA Volume:   42.60 ml  21.39 ml/m LA Vol (A4C):   27.5 ml 13.81 ml/m LA Biplane Vol: 31.0 ml 15.57 ml/m AORTIC VALVE LVOT Vmax:   137.00 cm/s LVOT Vmean:  90.600 cm/s LVOT VTI:    0.267 m  AORTA Ao Root diam: 3.10 cm Ao Asc diam:  3.20 cm  MITRAL VALVE MV Area (PHT): 3.85 cm    SHUNTS MV Decel Time: 197 msec    Systemic VTI:  0.27 m MV E velocity: 90.80 cm/s  Systemic Diam: 2.00 cm MV A velocity: 51.00 cm/s MV E/A ratio:  1.78  Ezra Shuck MD Electronically signed by Ezra Shuck MD Signature Date/Time: 09/02/2020/2:38:04 PM    Final          ______________________________________________________________________________________________     Recent Labs: No results found for requested labs within last 365 days.  Recent Lipid Panel    Component Value Date/Time   CHOL 122 11/02/2020 1138   TRIG 83 10/30/2022 0514   HDL 59 11/02/2020 1138   CHOLHDL 2.1 11/02/2020 1138   CHOLHDL 3.9 09/01/2020 0500   VLDL 16 09/01/2020 0500   LDLCALC 49 11/02/2020 1138    History of Present Illness    64 year old female with the above past medical history including mild LVH noted on prior echocardiogram, diastolic dysfunction, CVA, left MCA aneurysm s/p embolization in 2022, seizures, hyperlipidemia, iron deficiency anemia, peripheral neuropathy, anxiety,  depression, tobacco use, and obesity presents for heart first clinic new patient evaluation.  Mild LVH noted on echocardiogram in 2015.  Echocardiogram in 2022 showed EF 65 to 70%, normal LV function, no RWMA, G2 DD, normal RV systolic function, no significant valvular abnormalities.  She was referred to cardiology per PCP in the setting of abnormal EKG.   She presents today for heart first clinic new patient evaluation.  Overall, she reports feeling well.  EKG today shows nonspecific ST/T wave changes, borderline LVH, possible incomplete bundle branch block, no acute changes.  She is entirely asymptomatic.  She has a longstanding history of tobacco use, she has no since age of 69.  She is trying to cut back, currently only smokes 2 to 3 cigarettes a day.  She lives at home with her son.  She has 3 sons, 7 grandchildren.  She has not worked since the time of her embolization surgery in 2022.  She reports occasional caffeine use.  Overall, she reports feeling well.  Home Medications    Current Outpatient Medications  Medication Sig Dispense Refill   acetaminophen  (TYLENOL ) 325 MG tablet Take 2 tablets (650 mg total) by mouth every 6 (six) hours as needed for mild pain or headache. 100 tablet 0   aspirin  81 MG chewable tablet Chew 1 tablet (81 mg total) by mouth in the morning and at bedtime. 100 tablet 0   atorvastatin  (LIPITOR ) 80 MG tablet Take 1 tablet (80 mg total) by mouth at bedtime. 120 tablet 3   buPROPion  (WELLBUTRIN  XL) 300 MG 24 hr tablet Take 1 tablet (300 mg total) by mouth daily. 30 tablet 5   furosemide (LASIX) 20 MG tablet Take 20 mg by mouth daily.     hydrocortisone  2.5 % cream Apply 1 application. topically 2 (two) times daily as needed (itching).  levETIRAcetam  (KEPPRA ) 1000 MG tablet Take 1 tablet (1,000 mg total) by mouth 2 (two) times daily. Further rxs for this need to go through treating provider. 180 tablet 3   lidocaine  (LIDODERM ) 5 % Place 1 patch onto the skin daily.  Apply at 8 am and remove at  pm daily. 30 patch 0   meloxicam  (MOBIC ) 15 MG tablet Take 15 mg by mouth daily.     methocarbamol  (ROBAXIN ) 500 MG tablet Take 500 mg by mouth at bedtime as needed.     potassium chloride  (KLOR-CON ) 10 MEQ tablet Take 10 mEq by mouth daily.     senna-docusate (SENOKOT-S) 8.6-50 MG tablet Take 2 tablets by mouth at bedtime. 60 tablet 0   meloxicam  (MOBIC ) 7.5 MG tablet Take 1 tablet (7.5 mg total) by mouth daily. 30 tablet 5   No current facility-administered medications for this visit.     Review of Systems    She denies chest pain, palpitations, dyspnea, pnd, orthopnea, n, v, dizziness, syncope, edema, weight gain, or early satiety. All other systems reviewed and are otherwise negative except as noted above.   Physical Exam    VS:  BP 138/84 (BP Location: Right Arm, Patient Position: Sitting, Cuff Size: Normal)   Pulse 91   GEN: Well nourished, well developed, in no acute distress. HEENT: normal. Neck: Supple, no JVD, carotid bruits, or masses. Cardiac: RRR, no murmurs, rubs, or gallops. No clubbing, cyanosis, edema.  Radials/DP/PT 2+ and equal bilaterally.  Respiratory:  Respirations regular and unlabored, clear to auscultation bilaterally. GI: Soft, nontender, nondistended, BS + x 4. MS: no deformity or atrophy. Skin: warm and dry, no rash. Neuro:  Strength and sensation are intact. Psych: Normal affect.  Accessory Clinical Findings    ECG personally reviewed by me today - EKG Interpretation Date/Time:  Friday March 14 2024 13:31:24 EST Ventricular Rate:  91 PR Interval:  136 QRS Duration:  72 QT Interval:  356 QTC Calculation: 437 R Axis:   61  Text Interpretation: Normal sinus rhythm Right atrial enlargement Nonspecific T wave abnormality When compared with ECG of 29-Oct-2022 23:26, PREVIOUS ECG IS PRESENT Confirmed by Daneen Perkins ([id number]) on 03/14/2024 1:45:07 PM  - no acute changes.   Lab Results  Component Value Date   WBC 6.1  11/02/2022   HGB 10.8 (L) 11/02/2022   HCT 35.0 (L) 11/02/2022   MCV 76.3 (L) 11/02/2022   PLT 184 11/02/2022   Lab Results  Component Value Date   CREATININE 1.03 (H) 11/02/2022   BUN 9 11/02/2022   NA 139 11/02/2022   K 4.1 11/02/2022   CL 108 11/02/2022   CO2 22 11/02/2022   Lab Results  Component Value Date   ALT 24 10/30/2022   AST 25 10/30/2022   ALKPHOS 81 10/30/2022   BILITOT <0.1 (L) 10/30/2022   Lab Results  Component Value Date   CHOL 122 11/02/2020   HDL 59 11/02/2020   LDLCALC 49 11/02/2020   TRIG 83 10/30/2022   CHOLHDL 2.1 11/02/2020    Lab Results  Component Value Date   HGBA1C 5.8 (H) 10/30/2022    Assessment & Plan    1. Nonspecific abnormal EKG/LVH: She has a history of mild LVH noted on prior echocardiogram in 2015. Echocardiogram in 2022 showed EF 65 to 70%, normal LV function, no RWMA, G2 DD, normal RV systolic function, no significant valvular abnormalities.  She is entirely asymptomatic.  Euvolemic and well compensated on exam, denies symptoms concerning for angina.  Will update echocardiogram.  If echo shows any new/significant abnormalities, or should she have any new symptoms, we will schedule follow-up.  Otherwise, she can follow-up as needed.  2. Hyperlipidemia: LDL was 31 in 01/2024.  Monitored and managed per PCP.  Continue Lipitor .  3. History of CVA/history of left MCA aneurysm: History of prior stroke, no residual.   History of left MCA aneurysm s/p ablation in 2022.  Continue aspirin , Lipitor .  4. Tobacco use: Longstanding history of tobacco use.  She continues to smoke 2 to 3 cigarettes/day.  Full cessation advised.  5. Disposition: Follow-up as needed pending echo results.      Damien JAYSON Braver, NP 03/14/2024, 1:45 PM       [1] No Known Allergies  "

## 2024-03-17 ENCOUNTER — Encounter: Payer: Self-pay | Admitting: Nurse Practitioner

## 2024-03-26 ENCOUNTER — Ambulatory Visit (HOSPITAL_COMMUNITY)
Admission: RE | Admit: 2024-03-26 | Discharge: 2024-03-26 | Disposition: A | Source: Ambulatory Visit | Attending: Cardiovascular Disease | Admitting: Cardiovascular Disease

## 2024-03-26 DIAGNOSIS — R9431 Abnormal electrocardiogram [ECG] [EKG]: Secondary | ICD-10-CM

## 2024-03-26 LAB — ECHOCARDIOGRAM COMPLETE
Area-P 1/2: 3.99 cm2
S' Lateral: 2.6 cm

## 2024-05-21 ENCOUNTER — Ambulatory Visit: Admitting: Dermatology

## 2024-06-18 ENCOUNTER — Encounter: Admitting: Physical Medicine & Rehabilitation
